# Patient Record
Sex: Male | Born: 1955
Health system: Southern US, Community
[De-identification: ages and names within clinical notes are randomized; demographics above are authoritative.]

## PROBLEM LIST (undated history)

## (undated) DIAGNOSIS — K7460 Unspecified cirrhosis of liver: Secondary | ICD-10-CM

## (undated) DIAGNOSIS — Z7289 Other problems related to lifestyle: Secondary | ICD-10-CM

## (undated) DIAGNOSIS — I7121 Aneurysm of the ascending aorta, without rupture: Secondary | ICD-10-CM

## (undated) DIAGNOSIS — D649 Anemia, unspecified: Secondary | ICD-10-CM

## (undated) DIAGNOSIS — Z5900 Homelessness unspecified: Secondary | ICD-10-CM

## (undated) DIAGNOSIS — F1090 Alcohol use, unspecified, uncomplicated: Secondary | ICD-10-CM

## (undated) DIAGNOSIS — Z789 Other specified health status: Secondary | ICD-10-CM

## (undated) DIAGNOSIS — D696 Thrombocytopenia, unspecified: Secondary | ICD-10-CM

## (undated) DIAGNOSIS — Z720 Tobacco use: Secondary | ICD-10-CM

## (undated) DIAGNOSIS — J984 Other disorders of lung: Secondary | ICD-10-CM

## (undated) HISTORY — PX: OTHER SURGICAL HISTORY: SHX169

---

## 1987-08-09 DIAGNOSIS — K279 Peptic ulcer, site unspecified, unspecified as acute or chronic, without hemorrhage or perforation: Secondary | ICD-10-CM | POA: Insufficient documentation

## 1987-08-09 HISTORY — DX: Peptic ulcer, site unspecified, unspecified as acute or chronic, without hemorrhage or perforation: K27.9

## 2001-09-17 ENCOUNTER — Encounter: Payer: Self-pay | Admitting: Emergency Medicine

## 2001-09-17 ENCOUNTER — Emergency Department (HOSPITAL_COMMUNITY): Admission: EM | Admit: 2001-09-17 | Discharge: 2001-09-17 | Payer: Self-pay | Admitting: Emergency Medicine

## 2014-03-25 ENCOUNTER — Emergency Department (HOSPITAL_COMMUNITY): Payer: Self-pay

## 2014-03-25 ENCOUNTER — Encounter (HOSPITAL_COMMUNITY): Payer: Self-pay

## 2014-03-25 ENCOUNTER — Emergency Department (HOSPITAL_COMMUNITY)
Admission: EM | Admit: 2014-03-25 | Discharge: 2014-03-26 | Disposition: A | Discharge status: Home / Self Care | Payer: Self-pay | Attending: Emergency Medicine | Admitting: Emergency Medicine

## 2014-03-25 DIAGNOSIS — F1010 Alcohol abuse, uncomplicated: Secondary | ICD-10-CM | POA: Insufficient documentation

## 2014-03-25 DIAGNOSIS — F10920 Alcohol use, unspecified with intoxication, uncomplicated: Secondary | ICD-10-CM

## 2014-03-25 DIAGNOSIS — R4182 Altered mental status, unspecified: Secondary | ICD-10-CM | POA: Insufficient documentation

## 2014-03-25 LAB — CBC WITH DIFFERENTIAL/PLATELET
BASOS ABS: 0 10*3/uL (ref 0.0–0.1)
Basophils Relative: 1 % (ref 0–1)
EOS PCT: 6 % — AB (ref 0–5)
Eosinophils Absolute: 0.3 10*3/uL (ref 0.0–0.7)
HEMATOCRIT: 38.1 % — AB (ref 39.0–52.0)
HEMOGLOBIN: 13.1 g/dL (ref 13.0–17.0)
Lymphocytes Relative: 37 % (ref 12–46)
Lymphs Abs: 1.8 10*3/uL (ref 0.7–4.0)
MCH: 32.3 pg (ref 26.0–34.0)
MCHC: 34.4 g/dL (ref 30.0–36.0)
MCV: 94.1 fL (ref 78.0–100.0)
MONO ABS: 0.5 10*3/uL (ref 0.1–1.0)
MONOS PCT: 11 % (ref 3–12)
NEUTROS ABS: 2.2 10*3/uL (ref 1.7–7.7)
Neutrophils Relative %: 45 % (ref 43–77)
Platelets: 145 10*3/uL — ABNORMAL LOW (ref 150–400)
RBC: 4.05 MIL/uL — ABNORMAL LOW (ref 4.22–5.81)
RDW: 19.9 % — AB (ref 11.5–15.5)
WBC: 4.8 10*3/uL (ref 4.0–10.5)

## 2014-03-25 LAB — RAPID URINE DRUG SCREEN, HOSP PERFORMED
Amphetamines: NOT DETECTED
Barbiturates: NOT DETECTED
Benzodiazepines: NOT DETECTED
Cocaine: NOT DETECTED
Opiates: NOT DETECTED
Tetrahydrocannabinol: NOT DETECTED

## 2014-03-25 LAB — ETHANOL: Alcohol, Ethyl (B): 237 mg/dL — ABNORMAL HIGH (ref 0–11)

## 2014-03-25 MED ORDER — NALOXONE HCL 1 MG/ML IJ SOLN
1.0000 mg | Freq: Once | INTRAMUSCULAR | Status: AC
  Administered 2014-03-25: 1 mg via INTRAVENOUS
  Filled 2014-03-25: qty 2

## 2014-03-25 MED ORDER — SODIUM CHLORIDE 0.9 % IV SOLN
Freq: Once | INTRAVENOUS | Status: AC
  Administered 2014-03-25: 23:00:00 via INTRAVENOUS

## 2014-03-25 MED ORDER — NALOXONE HCL 0.4 MG/ML IJ SOLN
0.4000 mg | Freq: Once | INTRAMUSCULAR | Status: AC
  Administered 2014-03-25: 0.4 mg via INTRAVENOUS
  Filled 2014-03-25: qty 1

## 2014-03-25 MED ORDER — SODIUM CHLORIDE 0.9 % IV SOLN
Freq: Once | INTRAVENOUS | Status: AC
  Administered 2014-03-25: 21:00:00 via INTRAVENOUS

## 2014-03-25 MED ORDER — NALOXONE HCL 1 MG/ML IJ SOLN
1.0000 mg | Freq: Once | INTRAMUSCULAR | Status: AC
  Administered 2014-03-25: 1 mg via INTRAVENOUS

## 2014-03-25 MED ORDER — THIAMINE HCL 100 MG/ML IJ SOLN
100.0000 mg | Freq: Every day | INTRAMUSCULAR | Status: DC
  Administered 2014-03-25: 100 mg via INTRAVENOUS
  Filled 2014-03-25: qty 2

## 2014-03-25 MED ORDER — ONDANSETRON HCL 4 MG/2ML IJ SOLN
4.0000 mg | Freq: Once | INTRAMUSCULAR | Status: AC
  Administered 2014-03-25: 4 mg via INTRAVENOUS
  Filled 2014-03-25: qty 2

## 2014-03-25 NOTE — ED Provider Notes (Signed)
Medical screening examination/treatment/procedure(s) were conducted as a shared visit with non-physician practitioner(s) and myself.  I personally evaluated the patient during the encounter.   EKG Interpretation None      58 yo male presenting with decreased responsiveness.  Apparently, he was witnessed ingesting some unknown pills.  On exam, GCS 9 (2,2,5), small abrasion to left ear otherwise head atraumatic, c spine without stepoffs, lungs CTAB with reduced respiratory effort, heart sounds normal with RRR, abd soft.  Given 1mg  narcan x 2 with mild improvement in responsiveness.  Came up to GCS of 3,4,5.  Plan CT head, labs.   On repeat exam, pt still intoxicated, but much easier to arouse.  He will be monitored while he sobers.   Clinical Impression: 1. Alcohol intoxication, uncomplicated       Candyce ChurnJohn David Hanley Woerner III, MD 03/26/14 1620

## 2014-03-25 NOTE — ED Provider Notes (Signed)
CSN: [id number]     Arrival date & time 03/25/14  2026 History   First MD Initiated Contact with Patient 03/25/14 2027     Chief Complaint  Patient presents with  . Altered Mental Status     (Consider location/radiation/quality/duration/timing/severity/associated sxs/prior Treatment) HPI Comments: Cody Glenn Is a 58 y/o male found lying face down by police at the scene of a call for disturbing the peace. Police were called out to the scene for fighting. Patient was apparently drinking all day with friends. He apparently took 2 small "green pills" according to friends. They were unknown. No other history is known.   Patient is a 58 y.o. male presenting with altered mental status.  Altered Mental Status Presenting symptoms: partial responsiveness   Severity:  Severe Most recent episode:  Today Episode history:  Unable to specify Progression:  Improving Context: alcohol use and drug use     History reviewed. No pertinent past medical history. No past surgical history on file. No family history on file. History  Substance Use Topics  . Smoking status: Not on file  . Smokeless tobacco: Not on file  . Alcohol Use: Not on file    Review of Systems  Unable to perform ROS     Allergies  Review of patient's allergies indicates no known allergies.  Home Medications   Prior to Admission medications   Not on File   BP 131/82  Pulse 54  Temp(Src) 98.7 F (37.1 C) (Oral)  Resp 13  SpO2 99% Physical Exam  Nursing note and vitals reviewed. HENT:  Head: Normocephalic.  Small abrasion Behind left ear  no other signs of head trauma  Eyes:  Sluggish, pinpoint pupils  Neck: Normal range of motion. No JVD present.  Cardiovascular: Normal rate and regular rhythm.   Pulmonary/Chest: Effort normal. No respiratory distress.  Abdominal: Soft. Bowel sounds are normal.  Well healed midline abdominal scar  Neurological: GCS eye subscore is 2. GCS verbal subscore is 2. GCS  motor subscore is 5.    ED Course  Procedures (including critical care time) Labs Review Labs Reviewed  CBC WITH DIFFERENTIAL - Abnormal; Notable for the following:    RBC 4.05 (*)    HCT 38.1 (*)    RDW 19.9 (*)    Platelets 145 (*)    Eosinophils Relative 6 (*)    All other components within normal limits  ETHANOL - Abnormal; Notable for the following:    Alcohol, Ethyl (B) 237 (*)    All other components within normal limits  URINE RAPID DRUG SCREEN (HOSP PERFORMED)  CBG MONITORING, ED    Imaging Review No results found.   EKG Interpretation None      CRITICAL CARE Performed by: Arthor CaptainHarris, Cylan Borum   Total critical care time: 30  Close monitoring of airway, altered mental status. Multiple doses of narcan.  Critical care time was exclusive of separately billable procedures and treating other patients.  Critical care was necessary to treat or prevent imminent or life-threatening deterioration.  Critical care was time spent personally by me on the following activities: development of treatment plan with patient and/or surrogate as well as nursing, discussions with consultants, evaluation of patient's response to treatment, examination of patient, obtaining history from patient or surrogate, ordering and performing treatments and interventions, ordering and review of laboratory studies, ordering and review of radiographic studies, pulse oximetry and re-evaluation of patient's condition.  MDM   Final diagnoses:  None    9:30 PM Patient  brought in by EMS [employer]. Level five caveat due to AMS.  GCS of 9. Narcan 0.4 given,  AMS work up.    10:00PM patient destaurated to 89% Multiple rounds of narcan given with little response.      12:16 AM BP 131/82  Pulse 54  Temp(Src) 98.7 F (37.1 C) (Oral)  Resp 13  SpO2 99% Patient contnues to be somnolent but is arousable to voice and beginning to arouse. ETOH 237. uds is negative. CT head and cspine negative. Feel  this is due to alcohol intoxication. Will obs in the ED.  I have given report to Dr. Manus Gunning who will assume care.  Arthor Captain, PA-C 03/26/14 1559

## 2014-03-25 NOTE — ED Notes (Signed)
PT to ED via [facility] after reported being found on floor.  Pt's friends st's pt has been drinking and also took (2) unknown green round pills.  On arrival to ED pt unresponsive to verbal and painful stimuli.  Pt did move bil arms with ammonia capsule.  On arrival pt has c-collar in place

## 2014-03-25 NOTE — ED Notes (Signed)
No response with narcan.  MD made aware

## 2014-03-26 NOTE — Discharge Instructions (Signed)
Alcohol Intoxication  Alcohol intoxication occurs when the amount of alcohol that a person has consumed impairs his or her ability to mentally and physically function. Alcohol directly impairs the normal chemical activity of the brain. Drinking large amounts of alcohol can lead to changes in mental function and behavior, and it can cause many physical effects that can be harmful.   Alcohol intoxication can range in severity from mild to very severe. Various factors can affect the level of intoxication that occurs, such as the person's age, gender, weight, frequency of alcohol consumption, and the presence of other medical conditions (such as diabetes, seizures, or heart conditions). Dangerous levels of alcohol intoxication may occur when people drink large amounts of alcohol in a short period (binge drinking). Alcohol can also be especially dangerous when combined with certain prescription medicines or "recreational" drugs.  SIGNS AND SYMPTOMS  Some common signs and symptoms of mild alcohol intoxication include:  · Loss of coordination.  · Changes in mood and behavior.  · Impaired judgment.  · Slurred speech.  As alcohol intoxication progresses to more severe levels, other signs and symptoms will appear. These may include:  · Vomiting.  · Confusion and impaired memory.  · Slowed breathing.  · Seizures.  · Loss of consciousness.  DIAGNOSIS   Your health care provider will take a medical history and perform a physical exam. You will be asked about the amount and type of alcohol you have consumed. Blood tests will be done to measure the concentration of alcohol in your blood. In many places, your blood alcohol level must be lower than 80 mg/dL (0.08%) to legally drive. However, many dangerous effects of alcohol can occur at much lower levels.   TREATMENT   People with alcohol intoxication often do not require treatment. Most of the effects of alcohol intoxication are temporary, and they go away as the alcohol naturally  leaves the body. Your health care provider will monitor your condition until you are stable enough to go home. Fluids are sometimes given through an IV access tube to help prevent dehydration.   HOME CARE INSTRUCTIONS  · Do not drive after drinking alcohol.  · Stay hydrated. Drink enough water and fluids to keep your urine clear or pale yellow. Avoid caffeine.    · Only take over-the-counter or prescription medicines as directed by your health care provider.    SEEK MEDICAL CARE IF:   · You have persistent vomiting.    · You do not feel better after a few days.  · You have frequent alcohol intoxication. Your health care provider can help determine if you should see a substance use treatment counselor.  SEEK IMMEDIATE MEDICAL CARE IF:   · You become shaky or tremble when you try to stop drinking.    · You shake uncontrollably (seizure).    · You throw up (vomit) blood. This may be bright red or may look like black coffee grounds.    · You have blood in your stool. This may be bright red or may appear as a black, tarry, bad smelling stool.    · You become lightheaded or faint.    MAKE SURE YOU:   · Understand these instructions.  · Will watch your condition.  · Will get help right away if you are not doing well or get worse.  Document Released: 05/04/2005 Document Revised: 03/27/2013 Document Reviewed: 12/28/2012  [employer]® Patient Information ©2015 [employer]. This information is not intended to replace advice given to you by your health care provider. Make sure   you discuss any questions you have with your health care provider.

## 2014-03-26 NOTE — ED Provider Notes (Signed)
Medical screening examination/treatment/procedure(s) were performed by non-physician practitioner and as supervising physician I was immediately available for consultation/collaboration.   EKG Interpretation None        Cody ChurnJohn David Sundee Garland III, MD 03/26/14 [phone]

## 2014-03-26 NOTE — ED Provider Notes (Signed)
Recheck at 5:30 AM. Patient is awake and alert. He is oriented x3. He denies any complaint. He admits he has an alcohol problem. He denies any suicidal or homicidal thoughts. Tolerating by mouth and able to ambulate without assistance. He declined assistance with his alcohol abuse at this time. He denies any suicidal or homicidal thoughts  BP 134/77  Pulse 89  Temp(Src) 98 F (36.7 C) (Oral)  Resp 18  SpO2 99%   Cody OctaveStephen Nelson Julson, MD 03/26/14 [phone]

## 2014-03-26 NOTE — ED Notes (Signed)
Pt continues to sleep will arouse when spoken to.  Pt rolled over and urinated in floor then went back to sleep.

## 2014-03-26 NOTE — ED Notes (Signed)
Patient able to drink with no problems pt ambulated down the hall with steady gait.

## 2020-06-29 ENCOUNTER — Encounter (HOSPITAL_COMMUNITY): Payer: Self-pay

## 2020-06-29 ENCOUNTER — Emergency Department (HOSPITAL_COMMUNITY): Payer: Self-pay

## 2020-06-29 ENCOUNTER — Other Ambulatory Visit: Payer: Self-pay

## 2020-06-29 ENCOUNTER — Inpatient Hospital Stay (HOSPITAL_COMMUNITY)
Admission: EM | Admit: 2020-06-29 | Discharge: 2020-07-06 | DRG: 641 | Disposition: A | Discharge status: Home / Self Care | Payer: Self-pay | Attending: Family Medicine | Admitting: Family Medicine

## 2020-06-29 DIAGNOSIS — Z789 Other specified health status: Secondary | ICD-10-CM

## 2020-06-29 DIAGNOSIS — R7401 Elevation of levels of liver transaminase levels: Secondary | ICD-10-CM | POA: Diagnosis present

## 2020-06-29 DIAGNOSIS — R030 Elevated blood-pressure reading, without diagnosis of hypertension: Secondary | ICD-10-CM | POA: Diagnosis present

## 2020-06-29 DIAGNOSIS — E440 Moderate protein-calorie malnutrition: Secondary | ICD-10-CM | POA: Insufficient documentation

## 2020-06-29 DIAGNOSIS — Z6822 Body mass index (BMI) 22.0-22.9, adult: Secondary | ICD-10-CM

## 2020-06-29 DIAGNOSIS — Z720 Tobacco use: Secondary | ICD-10-CM | POA: Diagnosis present

## 2020-06-29 DIAGNOSIS — W19XXXA Unspecified fall, initial encounter: Secondary | ICD-10-CM

## 2020-06-29 DIAGNOSIS — R200 Anesthesia of skin: Secondary | ICD-10-CM | POA: Diagnosis present

## 2020-06-29 DIAGNOSIS — F17210 Nicotine dependence, cigarettes, uncomplicated: Secondary | ICD-10-CM | POA: Diagnosis present

## 2020-06-29 DIAGNOSIS — E871 Hypo-osmolality and hyponatremia: Principal | ICD-10-CM | POA: Diagnosis present

## 2020-06-29 DIAGNOSIS — M1611 Unilateral primary osteoarthritis, right hip: Secondary | ICD-10-CM | POA: Diagnosis present

## 2020-06-29 DIAGNOSIS — Z20822 Contact with and (suspected) exposure to covid-19: Secondary | ICD-10-CM | POA: Diagnosis present

## 2020-06-29 DIAGNOSIS — Y909 Presence of alcohol in blood, level not specified: Secondary | ICD-10-CM | POA: Diagnosis present

## 2020-06-29 DIAGNOSIS — F1090 Alcohol use, unspecified, uncomplicated: Secondary | ICD-10-CM

## 2020-06-29 DIAGNOSIS — E876 Hypokalemia: Secondary | ICD-10-CM

## 2020-06-29 DIAGNOSIS — R17 Unspecified jaundice: Secondary | ICD-10-CM | POA: Diagnosis present

## 2020-06-29 DIAGNOSIS — M4802 Spinal stenosis, cervical region: Secondary | ICD-10-CM | POA: Diagnosis present

## 2020-06-29 DIAGNOSIS — F10229 Alcohol dependence with intoxication, unspecified: Secondary | ICD-10-CM | POA: Diagnosis present

## 2020-06-29 DIAGNOSIS — Z7289 Other problems related to lifestyle: Secondary | ICD-10-CM

## 2020-06-29 DIAGNOSIS — Z5900 Homelessness unspecified: Secondary | ICD-10-CM

## 2020-06-29 DIAGNOSIS — W101XXA Fall (on)(from) sidewalk curb, initial encounter: Secondary | ICD-10-CM | POA: Diagnosis present

## 2020-06-29 DIAGNOSIS — S022XXA Fracture of nasal bones, initial encounter for closed fracture: Secondary | ICD-10-CM | POA: Diagnosis present

## 2020-06-29 HISTORY — DX: Alcohol use, unspecified, uncomplicated: F10.90

## 2020-06-29 HISTORY — DX: Other specified health status: Z78.9

## 2020-06-29 HISTORY — DX: Other problems related to lifestyle: Z72.89

## 2020-06-29 LAB — COMPREHENSIVE METABOLIC PANEL
ALT: 24 U/L (ref 0–44)
AST: 64 U/L — ABNORMAL HIGH (ref 15–41)
Albumin: 3.8 g/dL (ref 3.5–5.0)
Alkaline Phosphatase: 81 U/L (ref 38–126)
Anion gap: 14 (ref 5–15)
BUN: 8 mg/dL (ref 8–23)
CO2: 18 mmol/L — ABNORMAL LOW (ref 22–32)
Calcium: 8.5 mg/dL — ABNORMAL LOW (ref 8.9–10.3)
Chloride: 79 mmol/L — ABNORMAL LOW (ref 98–111)
Creatinine, Ser: 0.46 mg/dL — ABNORMAL LOW (ref 0.61–1.24)
GFR, Estimated: >60 mL/min (ref >60)
Glucose, Bld: 74 mg/dL (ref 70–99)
Potassium: 4.6 mmol/L (ref 3.5–5.1)
Sodium: 111 mmol/L — CL (ref 135–145)
Total Bilirubin: 2.9 mg/dL — ABNORMAL HIGH (ref 0.3–1.2)
Total Protein: 7.1 g/dL (ref 6.5–8.1)

## 2020-06-29 LAB — URINALYSIS, ROUTINE W REFLEX MICROSCOPIC
Bilirubin Urine: NEGATIVE
Glucose, UA: NEGATIVE mg/dL
Hgb urine dipstick: NEGATIVE
Ketones, ur: 20 mg/dL — AB
Leukocytes,Ua: NEGATIVE
Nitrite: NEGATIVE
Protein, ur: 100 mg/dL — AB
Specific Gravity, Urine: 1.02 (ref 1.005–1.030)
pH: 6 (ref 5.0–8.0)

## 2020-06-29 LAB — CBC WITH DIFFERENTIAL/PLATELET
Abs Immature Granulocytes: 0.07 10*3/uL (ref 0.00–0.07)
Basophils Absolute: 0 10*3/uL (ref 0.0–0.1)
Basophils Relative: 0 %
Eosinophils Absolute: 0 10*3/uL (ref 0.0–0.5)
Eosinophils Relative: 0 %
HCT: 35.5 % — ABNORMAL LOW (ref 39.0–52.0)
Hemoglobin: 13.2 g/dL (ref 13.0–17.0)
Immature Granulocytes: 1 %
Lymphocytes Relative: 12 %
Lymphs Abs: 1.3 10*3/uL (ref 0.7–4.0)
MCH: 34.4 pg — ABNORMAL HIGH (ref 26.0–34.0)
MCHC: 37.2 g/dL — ABNORMAL HIGH (ref 30.0–36.0)
MCV: 92.4 fL (ref 80.0–100.0)
Monocytes Absolute: 1 10*3/uL (ref 0.1–1.0)
Monocytes Relative: 10 %
Neutro Abs: 7.9 10*3/uL — ABNORMAL HIGH (ref 1.7–7.7)
Neutrophils Relative %: 77 %
Platelets: 240 10*3/uL (ref 150–400)
RBC: 3.84 MIL/uL — ABNORMAL LOW (ref 4.22–5.81)
RDW: 11.8 % (ref 11.5–15.5)
WBC: 10.3 10*3/uL (ref 4.0–10.5)
nRBC: 0 % (ref 0.0–0.2)

## 2020-06-29 LAB — RESP PANEL BY RT-PCR (FLU A&B, COVID) ARPGX2
Influenza A by PCR: NEGATIVE
Influenza B by PCR: NEGATIVE
SARS Coronavirus 2 by RT PCR: NEGATIVE

## 2020-06-29 LAB — RAPID URINE DRUG SCREEN, HOSP PERFORMED
Amphetamines: NOT DETECTED
Barbiturates: NOT DETECTED
Benzodiazepines: NOT DETECTED
Cocaine: NOT DETECTED
Opiates: NOT DETECTED
Tetrahydrocannabinol: NOT DETECTED

## 2020-06-29 LAB — ETHANOL: Alcohol, Ethyl (B): 14 mg/dL — ABNORMAL HIGH (ref <10)

## 2020-06-29 LAB — CREATININE, URINE, RANDOM: Creatinine, Urine: 114.97 mg/dL

## 2020-06-29 LAB — MAGNESIUM: Magnesium: 1.6 mg/dL — ABNORMAL LOW (ref 1.7–2.4)

## 2020-06-29 MED ORDER — ACETAMINOPHEN 325 MG PO TABS
650.0000 mg | ORAL_TABLET | Freq: Four times a day (QID) | ORAL | Status: DC | PRN
  Administered 2020-07-01 – 2020-07-04 (×2): 650 mg via ORAL
  Filled 2020-06-29 (×2): qty 2

## 2020-06-29 MED ORDER — SODIUM CHLORIDE 0.9 % IV BOLUS
1000.0000 mL | Freq: Once | INTRAVENOUS | Status: AC
  Administered 2020-06-29: 1000 mL via INTRAVENOUS

## 2020-06-29 MED ORDER — LORAZEPAM 1 MG PO TABS: 1.0000 mg | ORAL_TABLET | ORAL | Status: AC | PRN

## 2020-06-29 MED ORDER — THIAMINE HCL 100 MG/ML IJ SOLN
100.0000 mg | Freq: Every day | INTRAMUSCULAR | Status: DC
  Filled 2020-06-29 (×2): qty 2

## 2020-06-29 MED ORDER — ONDANSETRON HCL 4 MG PO TABS: 4.0000 mg | ORAL_TABLET | Freq: Four times a day (QID) | ORAL | Status: DC | PRN

## 2020-06-29 MED ORDER — ACETAMINOPHEN 650 MG RE SUPP: 650.0000 mg | Freq: Four times a day (QID) | RECTAL | Status: DC | PRN

## 2020-06-29 MED ORDER — CHLORHEXIDINE GLUCONATE CLOTH 2 % EX PADS
6.0000 | MEDICATED_PAD | Freq: Every day | CUTANEOUS | Status: DC
  Administered 2020-07-01 – 2020-07-02 (×3): 6 via TOPICAL

## 2020-06-29 MED ORDER — ORAL CARE MOUTH RINSE
15.0000 mL | Freq: Two times a day (BID) | OROMUCOSAL | Status: DC
  Administered 2020-06-30 – 2020-07-06 (×10): 15 mL via OROMUCOSAL

## 2020-06-29 MED ORDER — LORAZEPAM 2 MG/ML IJ SOLN: 1.0000 mg | INTRAMUSCULAR | Status: AC | PRN

## 2020-06-29 MED ORDER — THIAMINE HCL 100 MG PO TABS
100.0000 mg | ORAL_TABLET | Freq: Every day | ORAL | Status: DC
  Administered 2020-06-30 – 2020-07-06 (×7): 100 mg via ORAL
  Filled 2020-06-29 (×7): qty 1

## 2020-06-29 MED ORDER — ONDANSETRON HCL 4 MG/2ML IJ SOLN: 4.0000 mg | Freq: Four times a day (QID) | INTRAMUSCULAR | Status: DC | PRN

## 2020-06-29 MED ORDER — ENSURE ENLIVE PO LIQD
237.0000 mL | Freq: Two times a day (BID) | ORAL | Status: DC
  Administered 2020-06-30 – 2020-07-02 (×4): 237 mL via ORAL

## 2020-06-29 MED ORDER — NICOTINE 21 MG/24HR TD PT24
21.0000 mg | MEDICATED_PATCH | Freq: Every day | TRANSDERMAL | Status: DC
  Administered 2020-06-30 – 2020-07-06 (×7): 21 mg via TRANSDERMAL
  Filled 2020-06-29 (×7): qty 1

## 2020-06-29 MED ORDER — FOLIC ACID 1 MG PO TABS
1.0000 mg | ORAL_TABLET | Freq: Every day | ORAL | Status: DC
  Administered 2020-06-30 – 2020-07-06 (×7): 1 mg via ORAL
  Filled 2020-06-29 (×7): qty 1

## 2020-06-29 MED ORDER — MAGNESIUM SULFATE 2 GM/50ML IV SOLN
2.0000 g | Freq: Once | INTRAVENOUS | Status: AC
  Administered 2020-06-29: 2 g via INTRAVENOUS
  Filled 2020-06-29: qty 50

## 2020-06-29 MED ORDER — ADULT MULTIVITAMIN W/MINERALS CH
1.0000 | ORAL_TABLET | Freq: Every day | ORAL | Status: DC
  Administered 2020-06-30 – 2020-07-06 (×7): 1 via ORAL
  Filled 2020-06-29 (×7): qty 1

## 2020-06-29 MED ORDER — SODIUM CHLORIDE 0.9 % IV SOLN: INTRAVENOUS | Status: AC

## 2020-06-29 NOTE — H&P (Signed)
History and Physical    Cody Glenn BSJ:[id number] DOB: 07-22-1956 DOA: 06/29/2020  PCP: Patient, No Pcp Per  Patient coming from: Home via EMS  I have personally briefly reviewed patient's old medical records in [place]  Chief Complaint: Fall  HPI: Cody Glenn is a 64 y.o. male with medical history significant for alcohol and tobacco use who presents to the ED for evaluation after a fall.  Patient states he was outside when he was walking along the sidewalk when he tripped over the curb.  He states fell forward onto his face.  He says he thinks he lost consciousness temporarily.  He says he has been feeling lightheaded all day.  He has been having nausea and vomiting and some shortness of breath.  He reports a chronic cough which is unchanged.  He has not had any chest pain, abdominal pain, dysuria, diarrhea, seizure activity, loss of bowel/bladder control.  Patient does report chronic alcohol use, at least 3 quarts of beer daily with unspecified amount of liquor as well.  He is a chronic smoker of 1 pack/day since age of 35.  He denies any marijuana, cocaine, or IV drug use.  He reports a history of bleeding stomach ulcer requiring surgery over 30 years ago.  He is unaware of any other chronic medical conditions.  He is not currently taking any medications.  He says he has a primary care doctor with the [facility] system.  He is unaware of any medical conditions in his immediate family.  ED Course:  Initial vitals showed BP 167/74, pulse 84, RR 16, temp 98.4 Fahrenheit, SPO2 100% on room air.  Labs notable for sodium 111, potassium 4.6, chloride 79, bicarb 28, BUN 8, creatinine 0.46, serum glucose 74, AST 64, ALT 24, alk phos 81, total bilirubin 2.9, albumin 3.8, WBC 10.3, hemoglobin 13.2, platelets 240,000, serum ethanol 14.  Magnesium 1.6.  Urinalysis shows negative nitrates, negative leukocytes, 0-5 RBC/hpf, 6-10 WBC/hpf, rare bacteria on microscopy.  UDS is negative.  Serum  osmolality, urine osmolality and creatinine were obtained and pending.  SARS-CoV-2 PCR is collected and pending.  Right hip x-ray was negative for acute osseous abnormality.  Left hip x-ray was negative for acute osseous abnormality.  CT head/maxillofacial/C-spine without contrast showed prominent right forehead/periorbital hematoma, cystic lesion within the suprasellar region measuring 3.6 cm which is unchanged in size compared to previous CT March 25, 2014.  Medially displaced fracture to the left nasal bone was seen.  No evidence of acute fracture to the cervical spine noted.  Patient was given 1 L normal saline and the hospitalist service was consulted to admit for further evaluation and management.  Review of Systems: All systems reviewed and are negative except as documented in history of present illness above.   Past Medical History:  Diagnosis Date  . Alcohol use     Past Surgical History:  Procedure Laterality Date  . ulcer surgery in stomach     pt states he had this approx 33 years ago    Social History:  reports that he has been smoking cigarettes. He has a 40.00 pack-year smoking history. He has never used smokeless tobacco. He reports current alcohol use. He reports previous drug use.  No Known Allergies  History reviewed. No pertinent family history.   Prior to Admission medications   Not on File    Physical Exam: Vitals:   06/29/20 2115 06/29/20 2121 06/29/20 2130 06/29/20 2200  BP:  (!) 150/104 (!) 151/118 Marland Kitchen)  152/110  Pulse: 83 92 79 83  Resp:   18   Temp:      TempSrc:      SpO2: 100% 97% 99% 100%   Constitutional: Resting supine in bed, NAD, calm, comfortable Eyes: Left pupil round and reactive, unable to assess right due to periorbital hematoma. ENMT: Mucous membranes are moist. Posterior pharynx clear of any exudate or lesions.Normal dentition.  Neck: normal, supple, no masses. Respiratory: clear to auscultation bilaterally, no wheezing, no  crackles. Normal respiratory effort. No accessory muscle use.  Cardiovascular: Regular rate and rhythm, no murmurs / rubs / gallops. No extremity edema. 2+ pedal pulses. Abdomen: no tenderness, no masses palpated. No hepatosplenomegaly. Bowel sounds positive.  Musculoskeletal: no clubbing / cyanosis. No joint deformity upper and lower extremities. Good ROM, no contractures. Normal muscle tone.  Skin: Large right periorbital hematoma, abrasion on frontal scalp Neurologic: CN 2-12 grossly intact. Sensation intact, Strength 5/5 in all 4.  Psychiatric: Normal judgment and insight. Alert and oriented x 3. Normal mood.   Labs on Admission: I have personally reviewed following labs and imaging studies  CBC: Recent Labs  Lab 06/29/20 1903  WBC 10.3  NEUTROABS 7.9*  HGB 13.2  HCT 35.5*  MCV 92.4  PLT 027   Basic Metabolic Panel: Recent Labs  Lab 06/29/20 1903 06/29/20 1933  NA 111*  --   K 4.6  --   CL 79*  --   CO2 18*  --   GLUCOSE 74  --   BUN 8  --   CREATININE 0.46*  --   CALCIUM 8.5*  --   MG  --  1.6*   GFR: CrCl cannot be calculated (Unknown ideal weight.). Liver Function Tests: Recent Labs  Lab 06/29/20 1903  AST 64*  ALT 24  ALKPHOS 81  BILITOT 2.9*  PROT 7.1  ALBUMIN 3.8   No results for input(s): LIPASE, AMYLASE in the last 168 hours. No results for input(s): AMMONIA in the last 168 hours. Coagulation Profile: No results for input(s): INR, PROTIME in the last 168 hours. Cardiac Enzymes: No results for input(s): CKTOTAL, CKMB, CKMBINDEX, TROPONINI in the last 168 hours. BNP (last 3 results) No results for input(s): PROBNP in the last 8760 hours. HbA1C: No results for input(s): HGBA1C in the last 72 hours. CBG: No results for input(s): GLUCAP in the last 168 hours. Lipid Profile: No results for input(s): CHOL, HDL, LDLCALC, TRIG, CHOLHDL, LDLDIRECT in the last 72 hours. Thyroid Function Tests: No results for input(s): TSH, T4TOTAL, FREET4, T3FREE,  THYROIDAB in the last 72 hours. Anemia Panel: No results for input(s): VITAMINB12, FOLATE, FERRITIN, TIBC, IRON, RETICCTPCT in the last 72 hours. Urine analysis:    Component Value Date/Time   COLORURINE AMBER (A) 06/29/2020 1933   APPEARANCEUR CLEAR 06/29/2020 1933   LABSPEC 1.020 06/29/2020 1933   PHURINE 6.0 06/29/2020 1933   GLUCOSEU NEGATIVE 06/29/2020 [place] NEGATIVE 06/29/2020 [place] NEGATIVE 06/29/2020 1933   KETONESUR 20 (A) 06/29/2020 1933   PROTEINUR 100 (A) 06/29/2020 1933   NITRITE NEGATIVE 06/29/2020 1933   LEUKOCYTESUR NEGATIVE 06/29/2020 1933    Radiological Exams on Admission: CT Head Wo Contrast  Result Date: 06/29/2020 CLINICAL DATA:  Facial trauma. Additional history provided: Status post mechanical fall off curb, bilateral hip pain, head trauma. EXAM: CT HEAD WITHOUT CONTRAST CT MAXILLOFACIAL WITHOUT CONTRAST CT CERVICAL SPINE WITHOUT CONTRAST TECHNIQUE: Multidetector CT imaging of the head, cervical spine, and maxillofacial structures were performed using the  standard protocol without intravenous contrast. Multiplanar CT image reconstructions of the cervical spine and maxillofacial structures were also generated. COMPARISON:  CT head/cervical spine 03/25/2014. FINDINGS: CT HEAD FINDINGS Brain: Mild generalized cerebral atrophy. Redemonstrated CSF density cystic lesion in the suprasellar region. This is unchanged in size as compared to the head CT of 03/25/2014, again measuring up to 3.6 cm in greatest single dimension. There is no acute intracranial hemorrhage. No demarcated cortical infarct. No midline shift. Vascular: No hyperdense vessel.  Atherosclerotic calcifications Skull: Normal. Negative for fracture or focal lesion. Other: Large right forehead/right periorbital hematoma. CT MAXILLOFACIAL FINDINGS Osseous: Medially displaced fracture of the left nasal bone, new as compared to the prior head CT of 03/25/2014, but otherwise age indeterminate. No  other maxillofacial fracture is identified. Orbits: Prominent right periorbital hematoma. Otherwise, no acute finding. The globes are normal in size and contour. The extraocular muscles and optic nerve sheath complexes are symmetric and unremarkable. Sinuses: Trace scattered paranasal sinus mucosal thickening. No air-fluid levels. Soft tissues: Prominent right forehead/periorbital hematoma. Other: S-shaped deviation of the bony nasal septum. Small nasal septal defect (series 9, image 22). CT CERVICAL SPINE FINDINGS Alignment: Straightening of the expected cervical lordosis. Trace C4-C5 grade 1 anterolisthesis. Skull base and vertebrae: The basion-dental and atlanto-dental intervals are maintained.No evidence of acute fracture to the cervical spine. Prominent multilevel ventral osteophytes. Soft tissues and spinal canal: No prevertebral fluid or swelling. No visible canal hematoma. Disc levels: Cervical spondylosis with multilevel disc space narrowing, disc bulges, posterior disc osteophytes, uncovertebral hypertrophy and facet arthrosis. There is moderate (and possibly severe) no canal stenosis at C5-C6 and C6-C7. Multilevel bony neural foraminal narrowing. Upper chest: Paraseptal emphysema.  No visible pneumothorax. IMPRESSION: CT head: 1. No acute posttraumatic intracranial findings. 2. Prominent right forehead/periorbital hematoma. 3. Cystic lesion within the suprasellar region measuring 3.6 cm in greatest single dimension, unchanged in size as compared to the head CT of 03/25/2014. Differential considerations include epidermoid, arachnoid cyst or subarachnoid adhesion. Nonemergent contrast-enhanced MRI is recommended if not already performed. 4. Mild cerebral atrophy. CT maxillofacial: 1. Medially displaced fracture of the left nasal bone, new as compared to the head CT of 03/25/2014 but otherwise age indeterminate. 2. No other maxillofacial fracture is identified. 3. Prominent right forehead/periorbital  hematoma. 4. S-shaped deviation of the bony nasal septum. Small nasal septal defect. CT cervical spine: 1. No evidence of acute fracture to the cervical spine. 2. Mild chronic C4-C5 grade 1 anterolisthesis. 3. Cervical spondylosis as described. Suspect moderate (and possibly severe) spinal canal stenosis at C5-C6 and C6-C7 4.  Emphysema (ICD10-J43.9). Electronically Signed   By: Kellie Simmering DO   On: 06/29/2020 20:48   CT Cervical Spine Wo Contrast  Result Date: 06/29/2020 CLINICAL DATA:  Facial trauma. Additional history provided: Status post mechanical fall off curb, bilateral hip pain, head trauma. EXAM: CT HEAD WITHOUT CONTRAST CT MAXILLOFACIAL WITHOUT CONTRAST CT CERVICAL SPINE WITHOUT CONTRAST TECHNIQUE: Multidetector CT imaging of the head, cervical spine, and maxillofacial structures were performed using the standard protocol without intravenous contrast. Multiplanar CT image reconstructions of the cervical spine and maxillofacial structures were also generated. COMPARISON:  CT head/cervical spine 03/25/2014. FINDINGS: CT HEAD FINDINGS Brain: Mild generalized cerebral atrophy. Redemonstrated CSF density cystic lesion in the suprasellar region. This is unchanged in size as compared to the head CT of 03/25/2014, again measuring up to 3.6 cm in greatest single dimension. There is no acute intracranial hemorrhage. No demarcated cortical infarct. No midline shift. Vascular: No hyperdense vessel.  Atherosclerotic calcifications Skull: Normal. Negative for fracture or focal lesion. Other: Large right forehead/right periorbital hematoma. CT MAXILLOFACIAL FINDINGS Osseous: Medially displaced fracture of the left nasal bone, new as compared to the prior head CT of 03/25/2014, but otherwise age indeterminate. No other maxillofacial fracture is identified. Orbits: Prominent right periorbital hematoma. Otherwise, no acute finding. The globes are normal in size and contour. The extraocular muscles and optic nerve  sheath complexes are symmetric and unremarkable. Sinuses: Trace scattered paranasal sinus mucosal thickening. No air-fluid levels. Soft tissues: Prominent right forehead/periorbital hematoma. Other: S-shaped deviation of the bony nasal septum. Small nasal septal defect (series 9, image 22). CT CERVICAL SPINE FINDINGS Alignment: Straightening of the expected cervical lordosis. Trace C4-C5 grade 1 anterolisthesis. Skull base and vertebrae: The basion-dental and atlanto-dental intervals are maintained.No evidence of acute fracture to the cervical spine. Prominent multilevel ventral osteophytes. Soft tissues and spinal canal: No prevertebral fluid or swelling. No visible canal hematoma. Disc levels: Cervical spondylosis with multilevel disc space narrowing, disc bulges, posterior disc osteophytes, uncovertebral hypertrophy and facet arthrosis. There is moderate (and possibly severe) no canal stenosis at C5-C6 and C6-C7. Multilevel bony neural foraminal narrowing. Upper chest: Paraseptal emphysema.  No visible pneumothorax. IMPRESSION: CT head: 1. No acute posttraumatic intracranial findings. 2. Prominent right forehead/periorbital hematoma. 3. Cystic lesion within the suprasellar region measuring 3.6 cm in greatest single dimension, unchanged in size as compared to the head CT of 03/25/2014. Differential considerations include epidermoid, arachnoid cyst or subarachnoid adhesion. Nonemergent contrast-enhanced MRI is recommended if not already performed. 4. Mild cerebral atrophy. CT maxillofacial: 1. Medially displaced fracture of the left nasal bone, new as compared to the head CT of 03/25/2014 but otherwise age indeterminate. 2. No other maxillofacial fracture is identified. 3. Prominent right forehead/periorbital hematoma. 4. S-shaped deviation of the bony nasal septum. Small nasal septal defect. CT cervical spine: 1. No evidence of acute fracture to the cervical spine. 2. Mild chronic C4-C5 grade 1 anterolisthesis. 3.  Cervical spondylosis as described. Suspect moderate (and possibly severe) spinal canal stenosis at C5-C6 and C6-C7 4.  Emphysema (ICD10-J43.9). Electronically Signed   By: Kellie Simmering DO   On: 06/29/2020 20:48   DG Hip Unilat With Pelvis 2-3 Views Left  Result Date: 06/29/2020 CLINICAL DATA:  Hip pain history of fall EXAM: DG HIP (WITH OR WITHOUT PELVIS) 2-3V LEFT COMPARISON:  None. FINDINGS: SI joints are non widened. The pubic symphysis and rami appear intact. No acute displaced fracture or malalignment. 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 Vascular calcifications. IMPRESSION: No acute osseous abnormality. Electronically Signed   By: Donavan Foil M.D.   On: 06/29/2020 18:34   DG Hip Unilat  With Pelvis 2-3 Views Right  Result Date: 06/29/2020 CLINICAL DATA:  Hip pain after fall EXAM: DG HIP (WITH OR WITHOUT PELVIS) 2-3V RIGHT COMPARISON:  None. FINDINGS: No fracture or malalignment. Moderate arthritis of the right hip. Right pubic rami appear intact. IMPRESSION: No acute osseous abnormality. Electronically Signed   By: Donavan Foil M.D.   On: 06/29/2020 18:32   CT Maxillofacial Wo Contrast  Result Date: 06/29/2020 CLINICAL DATA:  Facial trauma. Additional history provided: Status post mechanical fall off curb, bilateral hip pain, head trauma. EXAM: CT HEAD WITHOUT CONTRAST CT MAXILLOFACIAL WITHOUT CONTRAST CT CERVICAL SPINE WITHOUT CONTRAST TECHNIQUE: Multidetector CT imaging of the head, cervical spine, and maxillofacial structures were performed using the standard protocol without intravenous contrast. Multiplanar CT image reconstructions of the cervical spine and maxillofacial structures were also  generated. COMPARISON:  CT head/cervical spine 03/25/2014. FINDINGS: CT HEAD FINDINGS Brain: Mild generalized cerebral atrophy. Redemonstrated CSF density cystic lesion in the suprasellar region. This is unchanged in size as compared to the head CT of 03/25/2014, again measuring up to 3.6 cm  in greatest single dimension. There is no acute intracranial hemorrhage. No demarcated cortical infarct. No midline shift. Vascular: No hyperdense vessel.  Atherosclerotic calcifications Skull: Normal. Negative for fracture or focal lesion. Other: Large right forehead/right periorbital hematoma. CT MAXILLOFACIAL FINDINGS Osseous: Medially displaced fracture of the left nasal bone, new as compared to the prior head CT of 03/25/2014, but otherwise age indeterminate. No other maxillofacial fracture is identified. Orbits: Prominent right periorbital hematoma. Otherwise, no acute finding. The globes are normal in size and contour. The extraocular muscles and optic nerve sheath complexes are symmetric and unremarkable. Sinuses: Trace scattered paranasal sinus mucosal thickening. No air-fluid levels. Soft tissues: Prominent right forehead/periorbital hematoma. Other: S-shaped deviation of the bony nasal septum. Small nasal septal defect (series 9, image 22). CT CERVICAL SPINE FINDINGS Alignment: Straightening of the expected cervical lordosis. Trace C4-C5 grade 1 anterolisthesis. Skull base and vertebrae: The basion-dental and atlanto-dental intervals are maintained.No evidence of acute fracture to the cervical spine. Prominent multilevel ventral osteophytes. Soft tissues and spinal canal: No prevertebral fluid or swelling. No visible canal hematoma. Disc levels: Cervical spondylosis with multilevel disc space narrowing, disc bulges, posterior disc osteophytes, uncovertebral hypertrophy and facet arthrosis. There is moderate (and possibly severe) no canal stenosis at C5-C6 and C6-C7. Multilevel bony neural foraminal narrowing. Upper chest: Paraseptal emphysema.  No visible pneumothorax. IMPRESSION: CT head: 1. No acute posttraumatic intracranial findings. 2. Prominent right forehead/periorbital hematoma. 3. Cystic lesion within the suprasellar region measuring 3.6 cm in greatest single dimension, unchanged in size as  compared to the head CT of 03/25/2014. Differential considerations include epidermoid, arachnoid cyst or subarachnoid adhesion. Nonemergent contrast-enhanced MRI is recommended if not already performed. 4. Mild cerebral atrophy. CT maxillofacial: 1. Medially displaced fracture of the left nasal bone, new as compared to the head CT of 03/25/2014 but otherwise age indeterminate. 2. No other maxillofacial fracture is identified. 3. Prominent right forehead/periorbital hematoma. 4. S-shaped deviation of the bony nasal septum. Small nasal septal defect. CT cervical spine: 1. No evidence of acute fracture to the cervical spine. 2. Mild chronic C4-C5 grade 1 anterolisthesis. 3. Cervical spondylosis as described. Suspect moderate (and possibly severe) spinal canal stenosis at C5-C6 and C6-C7 4.  Emphysema (ICD10-J43.9). Electronically Signed   By: Kellie Simmering DO   On: 06/29/2020 20:48    EKG: Not performed.  Assessment/Plan Principal Problem:   Hyponatremia Active Problems:   Alcohol use   Tobacco use  CESARIO WEIDINGER is a 64 y.o. male with medical history significant for alcohol and tobacco use who is admitted with hyponatremia.  Hyponatremia: Suspect related to beer potomania.  He has not had any seizure like activity or altered mental status. -S/p 1 L normal saline in the ED -Check sodium level now and q3h overnight -Start NS_0  mL/hour and adjust based on sodium trend -Goal correction is 6 mEq over first 24 hours  Alcohol use: Patient reports chronic daily alcohol use.  He denies any history of withdrawal but is certainly high risk. -Place on CIWA protocol with as needed Ativan  Hypomagnesemia: Replete with IV magnesium and recheck in a.m.  Mechanical fall resulting in right forehead/periorbital hematoma and medially displaced left nasal bone fracture: Occurring in setting of alcohol use.  Continue  fall precautions.  Tobacco use: Reports smoking 1 pack/day since age of 14.  Patient  advised on cessation.  Nicotine patch ordered.  Cystic lesion within the suprasellar region seen on CT head: Measuring 3.6 cm in greatest dimension and unchanged when compared to previous head CT 03/25/2014.  Nonemergent contrast-enhanced MRI was recommended if not already performed.   DVT prophylaxis: SCDs only for now given right periorbital hematoma Code Status: Full code, confirmed with patient Family Communication: Discussed with patient, he has discussed with family/friend Disposition Plan: From home and likely discharge to home pending adequate electrolyte abnormality correction Consults called: None Admission status:  Status is: Inpatient  Remains inpatient appropriate because:Persistent severe electrolyte disturbances   Dispo: The patient is from: Home              Anticipated d/c is to: Home              Anticipated d/c date is: 2 days              Patient currently is not medically stable to d/c.   Zada Finders MD [facility]  If 7PM-7AM, please contact night-coverage [email or web address]  06/29/2020, 10:17 PM

## 2020-06-29 NOTE — ED Provider Notes (Signed)
[facility]-EMERGENCY DEPT Provider Note   CSN: [id number] Arrival date & time: 06/29/20  1640     History Chief Complaint  Patient presents with  . Fall    Cody Glenn is a 64 y.o. male.  HPI 64 year old male with no significant medical history presents to the ER after mechanical fall.  Patient states that he was locking along the sidewalk and tripped over a curb.  He states he fell forward onto his face.  When asked if he lost consciousness, he replies "you can say that".  When asked to elaborate, he states he has EtOH on board.  Reports he has had 1 beer today.  Denies any daily drinking.  He denies any headache, nausea, vomiting, dizziness.  He has a large hematoma above his right eye, denies any blurry vision or vision disturbances.  He complains of bilateral hip pain.  States that he has not attempted to walk since the fall.  He denies any back pain, numbness or tingling in his extremities.  Denies any loss of bowel bladder control.  He is not on a blood thinner.  Denies any chest pain, shortness of breath, dizziness at the time of the fall.   History reviewed. No pertinent past medical history.  There are no problems to display for this patient.   History reviewed. No pertinent surgical history.     History reviewed. No pertinent family history.  Social History   Tobacco Use  . Smoking status: Not on file  Substance Use Topics  . Alcohol use: Not on file  . Drug use: Not on file    Home Medications Prior to Admission medications   Not on File    Allergies    Patient has no known allergies.  Review of Systems   Review of Systems  Constitutional: Negative for chills and fever.  HENT: Positive for facial swelling. Negative for ear pain and sore throat.   Eyes: Negative for photophobia, pain and visual disturbance.  Respiratory: Negative for cough and shortness of breath.   Cardiovascular: Negative for chest pain and palpitations.    Gastrointestinal: Negative for abdominal pain and vomiting.  Genitourinary: Negative for dysuria and hematuria.  Musculoskeletal: Positive for arthralgias. Negative for back pain.  Skin: Negative for color change and rash.  Neurological: Negative for seizures, syncope, weakness and numbness.  All other systems reviewed and are negative.   Physical Exam Updated Vital Signs BP (!) 166/95   Pulse 73   Temp (P) 98.4 F (36.9 C) (Oral)   Resp 20   SpO2 98%   Physical Exam Vitals and nursing note reviewed.  Constitutional:      General: He is not in acute distress.    Appearance: He is well-developed. He is not ill-appearing, toxic-appearing or diaphoretic.  HENT:     Head: Normocephalic and atraumatic.      Comments: Large golf ball size hematoma above the right eye in the area of the eyebrow.  Pupils are equal and reactive, no evidence of hyphema.  EOMs intact. Eyes:     Conjunctiva/sclera: Conjunctivae normal.  Neck:     Comments: No midline tenderness to the C, T, L-spine.  Full range of motion of neck. Cardiovascular:     Rate and Rhythm: Normal rate and regular rhythm.     Pulses: Normal pulses.     Heart sounds: No murmur heard.      Comments: 2+ radial pulses bilaterally Pulmonary:     Effort: Pulmonary effort  is normal. No respiratory distress.     Breath sounds: Normal breath sounds.  Abdominal:     General: Abdomen is flat.     Palpations: Abdomen is soft.     Tenderness: There is no abdominal tenderness.  Musculoskeletal:        General: Swelling and tenderness present. No deformity.     Cervical back: Neck supple.     Right lower leg: No edema.     Left lower leg: No edema.     Comments: Full range of motion of hips bilaterally, 5/5 strength.  No visible deformities, bruising, crepitus or step-offs.  No evidence of leg shortening, eversion or inversion.  Skin:    General: Skin is warm and dry.     Capillary Refill: Capillary refill takes less than 2  seconds.  Neurological:     General: No focal deficit present.     Mental Status: He is alert and oriented to person, place, and time.     ED Results / Procedures / Treatments   Labs (all labs ordered are listed, but only abnormal results are displayed) Labs Reviewed  CBC WITH DIFFERENTIAL/PLATELET - Abnormal; Notable for the following components:      Result Value   RBC 3.84 (*)    HCT 35.5 (*)    MCH 34.4 (*)    MCHC 37.2 (*)    Neutro Abs 7.9 (*)    All other components within normal limits  COMPREHENSIVE METABOLIC PANEL - Abnormal; Notable for the following components:   Sodium 111 (*)    Chloride 79 (*)    CO2 18 (*)    Creatinine, Ser 0.46 (*)    Calcium 8.5 (*)    AST 64 (*)    Total Bilirubin 2.9 (*)    All other components within normal limits  ETHANOL - Abnormal; Notable for the following components:   Alcohol, Ethyl (B) 14 (*)    All other components within normal limits  URINALYSIS, ROUTINE W REFLEX MICROSCOPIC - Abnormal; Notable for the following components:   Color, Urine AMBER (*)    Ketones, ur 20 (*)    Protein, ur 100 (*)    Bacteria, UA RARE (*)    All other components within normal limits  RESP PANEL BY RT-PCR (FLU A&B, COVID) ARPGX2  RAPID URINE DRUG SCREEN, HOSP PERFORMED  MAGNESIUM  URIC ACID, RANDOM URINE  CREATININE, URINE, RANDOM  OSMOLALITY  OSMOLALITY, URINE    EKG None  Radiology CT Head Wo Contrast  Result Date: 06/29/2020 CLINICAL DATA:  Facial trauma. Additional history provided: Status post mechanical fall off curb, bilateral hip pain, head trauma. EXAM: CT HEAD WITHOUT CONTRAST CT MAXILLOFACIAL WITHOUT CONTRAST CT CERVICAL SPINE WITHOUT CONTRAST TECHNIQUE: Multidetector CT imaging of the head, cervical spine, and maxillofacial structures were performed using the standard protocol without intravenous contrast. Multiplanar CT image reconstructions of the cervical spine and maxillofacial structures were also generated. COMPARISON:   CT head/cervical spine 03/25/2014. FINDINGS: CT HEAD FINDINGS Brain: Mild generalized cerebral atrophy. Redemonstrated CSF density cystic lesion in the suprasellar region. This is unchanged in size as compared to the head CT of 03/25/2014, again measuring up to 3.6 cm in greatest single dimension. There is no acute intracranial hemorrhage. No demarcated cortical infarct. No midline shift. Vascular: No hyperdense vessel.  Atherosclerotic calcifications Skull: Normal. Negative for fracture or focal lesion. Other: Large right forehead/right periorbital hematoma. CT MAXILLOFACIAL FINDINGS Osseous: Medially displaced fracture of the left nasal bone, new as compared to  the prior head CT of 03/25/2014, but otherwise age indeterminate. No other maxillofacial fracture is identified. Orbits: Prominent right periorbital hematoma. Otherwise, no acute finding. The globes are normal in size and contour. The extraocular muscles and optic nerve sheath complexes are symmetric and unremarkable. Sinuses: Trace scattered paranasal sinus mucosal thickening. No air-fluid levels. Soft tissues: Prominent right forehead/periorbital hematoma. Other: S-shaped deviation of the bony nasal septum. Small nasal septal defect (series 9, image 22). CT CERVICAL SPINE FINDINGS Alignment: Straightening of the expected cervical lordosis. Trace C4-C5 grade 1 anterolisthesis. Skull base and vertebrae: The basion-dental and atlanto-dental intervals are maintained.No evidence of acute fracture to the cervical spine. Prominent multilevel ventral osteophytes. Soft tissues and spinal canal: No prevertebral fluid or swelling. No visible canal hematoma. Disc levels: Cervical spondylosis with multilevel disc space narrowing, disc bulges, posterior disc osteophytes, uncovertebral hypertrophy and facet arthrosis. There is moderate (and possibly severe) no canal stenosis at C5-C6 and C6-C7. Multilevel bony neural foraminal narrowing. Upper chest: Paraseptal  emphysema.  No visible pneumothorax. IMPRESSION: CT head: 1. No acute posttraumatic intracranial findings. 2. Prominent right forehead/periorbital hematoma. 3. Cystic lesion within the suprasellar region measuring 3.6 cm in greatest single dimension, unchanged in size as compared to the head CT of 03/25/2014. Differential considerations include epidermoid, arachnoid cyst or subarachnoid adhesion. Nonemergent contrast-enhanced MRI is recommended if not already performed. 4. Mild cerebral atrophy. CT maxillofacial: 1. Medially displaced fracture of the left nasal bone, new as compared to the head CT of 03/25/2014 but otherwise age indeterminate. 2. No other maxillofacial fracture is identified. 3. Prominent right forehead/periorbital hematoma. 4. S-shaped deviation of the bony nasal septum. Small nasal septal defect. CT cervical spine: 1. No evidence of acute fracture to the cervical spine. 2. Mild chronic C4-C5 grade 1 anterolisthesis. 3. Cervical spondylosis as described. Suspect moderate (and possibly severe) spinal canal stenosis at C5-C6 and C6-C7 4.  Emphysema (ICD10-J43.9). Electronically Signed   By: Jackey Loge DO   On: 06/29/2020 20:48   CT Cervical Spine Wo Contrast  Result Date: 06/29/2020 CLINICAL DATA:  Facial trauma. Additional history provided: Status post mechanical fall off curb, bilateral hip pain, head trauma. EXAM: CT HEAD WITHOUT CONTRAST CT MAXILLOFACIAL WITHOUT CONTRAST CT CERVICAL SPINE WITHOUT CONTRAST TECHNIQUE: Multidetector CT imaging of the head, cervical spine, and maxillofacial structures were performed using the standard protocol without intravenous contrast. Multiplanar CT image reconstructions of the cervical spine and maxillofacial structures were also generated. COMPARISON:  CT head/cervical spine 03/25/2014. FINDINGS: CT HEAD FINDINGS Brain: Mild generalized cerebral atrophy. Redemonstrated CSF density cystic lesion in the suprasellar region. This is unchanged in size as  compared to the head CT of 03/25/2014, again measuring up to 3.6 cm in greatest single dimension. There is no acute intracranial hemorrhage. No demarcated cortical infarct. No midline shift. Vascular: No hyperdense vessel.  Atherosclerotic calcifications Skull: Normal. Negative for fracture or focal lesion. Other: Large right forehead/right periorbital hematoma. CT MAXILLOFACIAL FINDINGS Osseous: Medially displaced fracture of the left nasal bone, new as compared to the prior head CT of 03/25/2014, but otherwise age indeterminate. No other maxillofacial fracture is identified. Orbits: Prominent right periorbital hematoma. Otherwise, no acute finding. The globes are normal in size and contour. The extraocular muscles and optic nerve sheath complexes are symmetric and unremarkable. Sinuses: Trace scattered paranasal sinus mucosal thickening. No air-fluid levels. Soft tissues: Prominent right forehead/periorbital hematoma. Other: S-shaped deviation of the bony nasal septum. Small nasal septal defect (series 9, image 22). CT CERVICAL SPINE FINDINGS Alignment: Straightening  of the expected cervical lordosis. Trace C4-C5 grade 1 anterolisthesis. Skull base and vertebrae: The basion-dental and atlanto-dental intervals are maintained.No evidence of acute fracture to the cervical spine. Prominent multilevel ventral osteophytes. Soft tissues and spinal canal: No prevertebral fluid or swelling. No visible canal hematoma. Disc levels: Cervical spondylosis with multilevel disc space narrowing, disc bulges, posterior disc osteophytes, uncovertebral hypertrophy and facet arthrosis. There is moderate (and possibly severe) no canal stenosis at C5-C6 and C6-C7. Multilevel bony neural foraminal narrowing. Upper chest: Paraseptal emphysema.  No visible pneumothorax. IMPRESSION: CT head: 1. No acute posttraumatic intracranial findings. 2. Prominent right forehead/periorbital hematoma. 3. Cystic lesion within the suprasellar region  measuring 3.6 cm in greatest single dimension, unchanged in size as compared to the head CT of 03/25/2014. Differential considerations include epidermoid, arachnoid cyst or subarachnoid adhesion. Nonemergent contrast-enhanced MRI is recommended if not already performed. 4. Mild cerebral atrophy. CT maxillofacial: 1. Medially displaced fracture of the left nasal bone, new as compared to the head CT of 03/25/2014 but otherwise age indeterminate. 2. No other maxillofacial fracture is identified. 3. Prominent right forehead/periorbital hematoma. 4. S-shaped deviation of the bony nasal septum. Small nasal septal defect. CT cervical spine: 1. No evidence of acute fracture to the cervical spine. 2. Mild chronic C4-C5 grade 1 anterolisthesis. 3. Cervical spondylosis as described. Suspect moderate (and possibly severe) spinal canal stenosis at C5-C6 and C6-C7 4.  Emphysema (ICD10-J43.9). Electronically Signed   By: Jackey Loge DO   On: 06/29/2020 20:48   DG Hip Unilat With Pelvis 2-3 Views Left  Result Date: 06/29/2020 CLINICAL DATA:  Hip pain history of fall EXAM: DG HIP (WITH OR WITHOUT PELVIS) 2-3V LEFT COMPARISON:  None. FINDINGS: SI joints are non widened. The pubic symphysis and rami appear intact. No acute displaced fracture or malalignment. 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 Vascular calcifications. IMPRESSION: No acute osseous abnormality. Electronically Signed   By: Jasmine Pang M.D.   On: 06/29/2020 18:34   DG Hip Unilat  With Pelvis 2-3 Views Right  Result Date: 06/29/2020 CLINICAL DATA:  Hip pain after fall EXAM: DG HIP (WITH OR WITHOUT PELVIS) 2-3V RIGHT COMPARISON:  None. FINDINGS: No fracture or malalignment. Moderate arthritis of the right hip. Right pubic rami appear intact. IMPRESSION: No acute osseous abnormality. Electronically Signed   By: Jasmine Pang M.D.   On: 06/29/2020 18:32   CT Maxillofacial Wo Contrast  Result Date: 06/29/2020 CLINICAL DATA:  Facial trauma. Additional  history provided: Status post mechanical fall off curb, bilateral hip pain, head trauma. EXAM: CT HEAD WITHOUT CONTRAST CT MAXILLOFACIAL WITHOUT CONTRAST CT CERVICAL SPINE WITHOUT CONTRAST TECHNIQUE: Multidetector CT imaging of the head, cervical spine, and maxillofacial structures were performed using the standard protocol without intravenous contrast. Multiplanar CT image reconstructions of the cervical spine and maxillofacial structures were also generated. COMPARISON:  CT head/cervical spine 03/25/2014. FINDINGS: CT HEAD FINDINGS Brain: Mild generalized cerebral atrophy. Redemonstrated CSF density cystic lesion in the suprasellar region. This is unchanged in size as compared to the head CT of 03/25/2014, again measuring up to 3.6 cm in greatest single dimension. There is no acute intracranial hemorrhage. No demarcated cortical infarct. No midline shift. Vascular: No hyperdense vessel.  Atherosclerotic calcifications Skull: Normal. Negative for fracture or focal lesion. Other: Large right forehead/right periorbital hematoma. CT MAXILLOFACIAL FINDINGS Osseous: Medially displaced fracture of the left nasal bone, new as compared to the prior head CT of 03/25/2014, but otherwise age indeterminate. No other maxillofacial fracture is identified. Orbits:  Prominent right periorbital hematoma. Otherwise, no acute finding. The globes are normal in size and contour. The extraocular muscles and optic nerve sheath complexes are symmetric and unremarkable. Sinuses: Trace scattered paranasal sinus mucosal thickening. No air-fluid levels. Soft tissues: Prominent right forehead/periorbital hematoma. Other: S-shaped deviation of the bony nasal septum. Small nasal septal defect (series 9, image 22). CT CERVICAL SPINE FINDINGS Alignment: Straightening of the expected cervical lordosis. Trace C4-C5 grade 1 anterolisthesis. Skull base and vertebrae: The basion-dental and atlanto-dental intervals are maintained.No evidence of acute  fracture to the cervical spine. Prominent multilevel ventral osteophytes. Soft tissues and spinal canal: No prevertebral fluid or swelling. No visible canal hematoma. Disc levels: Cervical spondylosis with multilevel disc space narrowing, disc bulges, posterior disc osteophytes, uncovertebral hypertrophy and facet arthrosis. There is moderate (and possibly severe) no canal stenosis at C5-C6 and C6-C7. Multilevel bony neural foraminal narrowing. Upper chest: Paraseptal emphysema.  No visible pneumothorax. IMPRESSION: CT head: 1. No acute posttraumatic intracranial findings. 2. Prominent right forehead/periorbital hematoma. 3. Cystic lesion within the suprasellar region measuring 3.6 cm in greatest single dimension, unchanged in size as compared to the head CT of 03/25/2014. Differential considerations include epidermoid, arachnoid cyst or subarachnoid adhesion. Nonemergent contrast-enhanced MRI is recommended if not already performed. 4. Mild cerebral atrophy. CT maxillofacial: 1. Medially displaced fracture of the left nasal bone, new as compared to the head CT of 03/25/2014 but otherwise age indeterminate. 2. No other maxillofacial fracture is identified. 3. Prominent right forehead/periorbital hematoma. 4. S-shaped deviation of the bony nasal septum. Small nasal septal defect. CT cervical spine: 1. No evidence of acute fracture to the cervical spine. 2. Mild chronic C4-C5 grade 1 anterolisthesis. 3. Cervical spondylosis as described. Suspect moderate (and possibly severe) spinal canal stenosis at C5-C6 and C6-C7 4.  Emphysema (ICD10-J43.9). Electronically Signed   By: Jackey Loge DO   On: 06/29/2020 20:48    Procedures Procedures (including critical care time)  Medications Ordered in ED Medications  sodium chloride 0.9 % bolus 1,000 mL (1,000 mLs Intravenous New Bag/Given 06/29/20 2122)    ED Course  I have reviewed the triage vital signs and the nursing notes.  Pertinent labs & imaging results that  were available during my care of the patient were reviewed by me and considered in my medical decision making (see chart for details).    MDM Rules/Calculators/A&P                          62:21 PM:  64 year old male presents to the ER after mechanical fall.  EtOH on board.  Vitals overall reassuring.  Physical exam with visible large hematoma above right eye, no evidence of hyphema.  He has no C, T, L-spine tenderness.  He complains of bilateral hip pain but has full range of motion and strength.  No evidence of leg shortening, eversion or inversion.  2+ radial and DP pulses bilaterally.  Chest and abdomen nontender, no bruising.  Given his age, alcohol level, and visible hematoma above the right eye, will order CT of the head, maxillofacial and CT of the spine to rule out injury.  Plain films of the pelvis ordered in triage.  We will also order basic labs, ethanol, UDS, urine.  9:03 PM:  Labs reviewed and interpreted by me -BC without leukocytosis, normal hemoglobin. -CMP with a critical sodium of 111. Creatinine of 0.46. Mildly elevated AST at 64. -UA with ketones and protein, no blood or evidence of UTI.  Imaging reviewed by myself and radiology -Plain films of the hips without evidence of fracture -CT of the head with cystic lesion in the suprasellar region unchanged from CTA in 2015. No evidence of bleed. -CT of the C-spine without evidence of acute fracture -CT maxillofacial confirming large right hematoma above eye. He does have a medially displaced left nasal bone fracture  MDM: Patient with severe hyponatremia in the setting of alcohol use and elevated LFT. Suspect beer potomania, patient is not seizing, no AMS, A&Ox3. Will start on normal saline, magnesium, uric acid, osmolality, urine creatinine and Covid test ordered. Consulted hospitalist for admission.  Spoke with hospitalist Dr. Allena Katz who will admit the patient for further management and treatment   Final Clinical  Impression(s) / ED Diagnoses Final diagnoses:  Fall, initial encounter  Closed fracture of nasal bone, initial encounter  Hyponatremia    Rx / DC Orders ED Discharge Orders    None       Leone Brand 06/29/20 2126    Sabas Sous, MD 06/29/20 2344

## 2020-06-29 NOTE — ED Notes (Signed)
Date and time results received: 06/29/20 2012   Test: Sodium Critical Value: 111  Name of Provider Notified: Pilar Plate, MD

## 2020-06-29 NOTE — ED Notes (Signed)
Attempted to call report to receiving unit, no response at this time, will try again in 5 minutes.

## 2020-06-29 NOTE — ED Notes (Signed)
Pt to xray

## 2020-06-29 NOTE — ED Triage Notes (Addendum)
Pt arrived via EMS, s/p mechanical fall off curb, c/o bilateral hip pain. Hit head. Denies any LOC. No blood thinners.

## 2020-06-29 NOTE — ED Notes (Signed)
Report called to Danielle RN.

## 2020-06-30 DIAGNOSIS — E440 Moderate protein-calorie malnutrition: Secondary | ICD-10-CM | POA: Insufficient documentation

## 2020-06-30 DIAGNOSIS — R748 Abnormal levels of other serum enzymes: Secondary | ICD-10-CM

## 2020-06-30 DIAGNOSIS — W19XXXA Unspecified fall, initial encounter: Secondary | ICD-10-CM

## 2020-06-30 DIAGNOSIS — Z7289 Other problems related to lifestyle: Secondary | ICD-10-CM

## 2020-06-30 DIAGNOSIS — Y92009 Unspecified place in unspecified non-institutional (private) residence as the place of occurrence of the external cause: Secondary | ICD-10-CM

## 2020-06-30 DIAGNOSIS — Z720 Tobacco use: Secondary | ICD-10-CM

## 2020-06-30 DIAGNOSIS — T148XXA Other injury of unspecified body region, initial encounter: Secondary | ICD-10-CM

## 2020-06-30 DIAGNOSIS — S022XXA Fracture of nasal bones, initial encounter for closed fracture: Secondary | ICD-10-CM

## 2020-06-30 LAB — OSMOLALITY, URINE: Osmolality, Ur: 630 mOsm/kg (ref 300–900)

## 2020-06-30 LAB — CBC
HCT: 35.5 % — ABNORMAL LOW (ref 39.0–52.0)
Hemoglobin: 13.1 g/dL (ref 13.0–17.0)
MCH: 34.8 pg — ABNORMAL HIGH (ref 26.0–34.0)
MCHC: 36.9 g/dL — ABNORMAL HIGH (ref 30.0–36.0)
MCV: 94.4 fL (ref 80.0–100.0)
Platelets: 229 10*3/uL (ref 150–400)
RBC: 3.76 MIL/uL — ABNORMAL LOW (ref 4.22–5.81)
RDW: 11.9 % (ref 11.5–15.5)
WBC: 8.5 10*3/uL (ref 4.0–10.5)
nRBC: 0 % (ref 0.0–0.2)

## 2020-06-30 LAB — IRON AND TIBC
Iron: 318 ug/dL — ABNORMAL HIGH (ref 45–182)
Saturation Ratios: 87 % — ABNORMAL HIGH (ref 17.9–39.5)
TIBC: 367 ug/dL (ref 250–450)
UIBC: 49 ug/dL

## 2020-06-30 LAB — COMPREHENSIVE METABOLIC PANEL
ALT: 24 U/L (ref 0–44)
AST: 59 U/L — ABNORMAL HIGH (ref 15–41)
Albumin: 3.6 g/dL (ref 3.5–5.0)
Alkaline Phosphatase: 78 U/L (ref 38–126)
Anion gap: 12 (ref 5–15)
BUN: 8 mg/dL (ref 8–23)
CO2: 21 mmol/L — ABNORMAL LOW (ref 22–32)
Calcium: 8.5 mg/dL — ABNORMAL LOW (ref 8.9–10.3)
Chloride: 87 mmol/L — ABNORMAL LOW (ref 98–111)
Creatinine, Ser: 0.58 mg/dL — ABNORMAL LOW (ref 0.61–1.24)
GFR, Estimated: >60 mL/min (ref >60)
Glucose, Bld: 76 mg/dL (ref 70–99)
Potassium: 4.7 mmol/L (ref 3.5–5.1)
Sodium: 120 mmol/L — ABNORMAL LOW (ref 135–145)
Total Bilirubin: 3.9 mg/dL — ABNORMAL HIGH (ref 0.3–1.2)
Total Protein: 6.8 g/dL (ref 6.5–8.1)

## 2020-06-30 LAB — SODIUM
Sodium: 116 mmol/L — CL (ref 135–145)
Sodium: 120 mmol/L — ABNORMAL LOW (ref 135–145)
Sodium: 123 mmol/L — ABNORMAL LOW (ref 135–145)
Sodium: 124 mmol/L — ABNORMAL LOW (ref 135–145)

## 2020-06-30 LAB — OSMOLALITY: Osmolality: 238 mOsm/kg — CL (ref 275–295)

## 2020-06-30 LAB — FOLATE: Folate: 9.4 ng/mL (ref >5.9)

## 2020-06-30 LAB — RETICULOCYTES
Immature Retic Fract: 11.6 % (ref 2.3–15.9)
RBC.: 3.82 MIL/uL — ABNORMAL LOW (ref 4.22–5.81)
Retic Count, Absolute: 63 10*3/uL (ref 19.0–186.0)
Retic Ct Pct: 1.7 % (ref 0.4–3.1)

## 2020-06-30 LAB — BILIRUBIN, FRACTIONATED(TOT/DIR/INDIR)
Bilirubin, Direct: 0.6 mg/dL — ABNORMAL HIGH (ref 0.0–0.2)
Indirect Bilirubin: 2.9 mg/dL — ABNORMAL HIGH (ref 0.3–0.9)
Total Bilirubin: 3.5 mg/dL — ABNORMAL HIGH (ref 0.3–1.2)

## 2020-06-30 LAB — FERRITIN: Ferritin: 190 ng/mL (ref 24–336)

## 2020-06-30 LAB — HIV ANTIBODY (ROUTINE TESTING W REFLEX): HIV Screen 4th Generation wRfx: NONREACTIVE

## 2020-06-30 LAB — MRSA PCR SCREENING: MRSA by PCR: NEGATIVE

## 2020-06-30 LAB — VITAMIN B12: Vitamin B-12: 107 pg/mL — ABNORMAL LOW (ref 180–914)

## 2020-06-30 LAB — MAGNESIUM: Magnesium: 2.3 mg/dL (ref 1.7–2.4)

## 2020-06-30 LAB — PHOSPHORUS: Phosphorus: 3.4 mg/dL (ref 2.5–4.6)

## 2020-06-30 MED ORDER — CLONIDINE HCL 0.1 MG PO TABS
0.1000 mg | ORAL_TABLET | Freq: Two times a day (BID) | ORAL | Status: DC
  Administered 2020-06-30 – 2020-07-06 (×13): 0.1 mg via ORAL
  Filled 2020-06-30 (×13): qty 1

## 2020-06-30 MED ORDER — CYANOCOBALAMIN 1000 MCG/ML IJ SOLN
1000.0000 ug | Freq: Every day | INTRAMUSCULAR | Status: AC
  Administered 2020-06-30 – 2020-07-06 (×7): 1000 ug via INTRAMUSCULAR
  Filled 2020-06-30 (×7): qty 1

## 2020-06-30 MED ORDER — GABAPENTIN 300 MG PO CAPS
300.0000 mg | ORAL_CAPSULE | Freq: Three times a day (TID) | ORAL | Status: DC
  Administered 2020-06-30 – 2020-07-06 (×19): 300 mg via ORAL
  Filled 2020-06-30 (×19): qty 1

## 2020-06-30 MED ORDER — HYDRALAZINE HCL 20 MG/ML IJ SOLN: 10.0000 mg | INTRAMUSCULAR | Status: DC | PRN

## 2020-06-30 NOTE — Progress Notes (Signed)
CRITICAL VALUE ALERT  Critical Value:  Serum osmolality 238  Date & Time Notied:  11/23 @ 0430  Provider Notified: Reyes Ivan via amion  Orders Received/Actions taken: awaiting new orders

## 2020-06-30 NOTE — Plan of Care (Signed)
RN will continue to monitor patient's progression of care plan.  

## 2020-06-30 NOTE — Progress Notes (Signed)
CRITICAL VALUE ALERT  Critical Value:  Sodium 116   Date & Time Notied:  06/30/20 @ 0220  Provider Notified: E ouma via amion  Orders Received/Actions taken: awaiting new orders

## 2020-06-30 NOTE — Progress Notes (Signed)
Upon AM assessment, this RN noticed patient's bedside monitor alarming ST elevation. V lead appeared to be malpositioned, and when repositioned by RN, bedside monitor stopped alarming. Charge RN also assessed pt at bedside. EKG performed, and interpretation showed possible septal infarct. This RN notified MD; awaiting orders at this time.   MD also notified by RN of persistent HTN at rest. This RN will continue to carefully monitor pt while waiting on orders.

## 2020-06-30 NOTE — TOC Initial Note (Signed)
Transition of Care [facility]) - Initial/Assessment Note    Patient Details  Name: Cody Glenn MRN: [id number] Date of Birth: 06-25-1956  Transition of Care [facility]) CM/SW Contact:    Golda Acre, RN Phone Number: 06/30/2020, 7:33 AM  Clinical Narrative:                 64 y.o. male with medical history significant for alcohol and tobacco use who presents to the ED for evaluation after a fall.  Patient states he was outside when he was walking along the sidewalk when he tripped over the curb.  He states fell forward onto his face.  He says he thinks he lost consciousness temporarily.  He says he has been feeling lightheaded all day.  He has been having nausea and vomiting and some shortness of breath.  He reports a chronic cough which is unchanged.  He has not had any chest pain, abdominal pain, dysuria, diarrhea, seizure activity, loss of bowel/bladder control.  Patient does report chronic alcohol use, at least 3 quarts of beer daily with unspecified amount of liquor as well.  He is a chronic smoker of 1 pack/day since age of 69.  He denies any marijuana, cocaine, or IV drug use.  He reports a history of bleeding stomach ulcer requiring surgery over 30 years ago.  He is unaware of any other chronic medical conditions.  He is not currently taking any medications.  [facility]  He is unaware of any medical conditions in his immediate family. Plan is to return to home , does not have a pcp-will need follow up appt and pcp, Following for progression. Expected Discharge Plan: Home/Self Care Barriers to Discharge: No Barriers Identified   Patient Goals and CMS Choice Patient states their goals for this hospitalization and ongoing recovery are:: to go home CMS Medicare.gov Compare Post Acute Care list provided to:: Patient    Expected Discharge Plan and Services Expected Discharge Plan: Home/Self Care In-house Referral: Financial  Counselor Discharge Planning Services: CM Consult, Medication Assistance, Other - See comment (substance abuse resources)   Living arrangements for the past 2 months: Single Family Home                                      Prior Living Arrangements/Services Living arrangements for the past 2 months: Single Family Home Lives with:: Self Patient language and need for interpreter reviewed:: Yes Do you feel safe going back to the place where you live?: Yes      Need for Family Participation in Patient Care: Yes (Comment) (brother) Care giver support system in place?: Yes (comment) (brother)   Criminal Activity/Legal Involvement Pertinent to Current Situation/Hospitalization: No - Comment as needed  Activities of Daily Living Home Assistive Devices/Equipment: None ADL Screening (condition at time of admission) Patient's cognitive ability adequate to safely complete daily activities?: Yes Is the patient deaf or have difficulty hearing?: No Does the patient have difficulty seeing, even when wearing glasses/contacts?: No Does the patient have difficulty concentrating, remembering, or making decisions?: No Patient able to express need for assistance with ADLs?: Yes Does the patient have difficulty dressing or bathing?: No Independently performs ADLs?: No Communication: Independent Dressing (OT): Needs assistance Is this a change from baseline?: Change from baseline, expected to last >3 days Grooming: Independent Feeding: Independent Bathing: Needs assistance Is this a  change from baseline?: Change from baseline, expected to last >3 days Toileting: Needs assistance Is this a change from baseline?: Change from baseline, expected to last >3days Walks in Home: Needs assistance Is this a change from baseline?: Change from baseline, expected to last >3 days Does the patient have difficulty walking or climbing stairs?: Yes Weakness of Legs: Both Weakness of Arms/Hands:  Both  Permission Sought/Granted                  Emotional Assessment Appearance:: Appears stated age Attitude/Demeanor/Rapport: Apprehensive Affect (typically observed): Calm Orientation: : Oriented to Place, Oriented to Self, Oriented to  Time, Oriented to Situation Alcohol / Substance Use: Alcohol Use Psych Involvement: No (comment)  Admission diagnosis:  Hyponatremia [E87.1] Closed fracture of nasal bone, initial encounter [S02.2XXA] Fall, initial encounter [id number]] Patient Active Problem List   Diagnosis Date Noted  . Hyponatremia 06/29/2020  . Alcohol use 06/29/2020  . Tobacco use 06/29/2020   PCP:  Patient, No Pcp Per Pharmacy:   [facility] [address], [place] - [address] [place] [address] Phone: [phone] Fax: [phone]     Social Determinants of Health (SDOH) Interventions    Readmission Risk Interventions No flowsheet data found.

## 2020-06-30 NOTE — Plan of Care (Signed)
  Problem: Safety: Goal: Ability to remain free from injury will improve Outcome: Progressing   Problem: Education: Goal: Knowledge of disease or condition will improve Outcome: Progressing   

## 2020-06-30 NOTE — Progress Notes (Signed)
Initial Nutrition Assessment  DOCUMENTATION CODES:   Non-severe (moderate) malnutrition in context of social or environmental circumstances  INTERVENTION:  - continue Ensure Enlive BID, each supplement provides 350 kcal and 20 grams of protein.   NUTRITION DIAGNOSIS:   Moderate Malnutrition related to social / environmental circumstances (alcohol abuse) as evidenced by mild fat depletion, mild muscle depletion, moderate muscle depletion.  GOAL:   Patient will meet greater than or equal to 90% of their needs  MONITOR:   PO intake, Supplement acceptance, Labs, Weight trends  REASON FOR ASSESSMENT:   Malnutrition Screening Tool  ASSESSMENT:   64 y.o. male with history of alcohol abuse and tobacco use. He presented to the ED after a fall while outside; he was walking on the sidewalk and tripped over the curb. He thinks he lost consciousness temporarily. He was experiencing N/V and SOB PTA and has a chronic cough. He smokes 1 pack of cigarettes/day and drinks 3 quarts of beer/day.  Patient ate 80% of breakfast this AM (620 kcal, 18 grams protein). Able to talk with RN who reports patient ate well for breakfast and that he accepted and drank bottle of Ensure offered to him.   Patient laying in bed and was somewhat drowsy throughout visit. He confirms eating well for breakfast and that he has some slight nausea after meal. He does not have any difficulty or pain with chewing or swallowing at baseline.  He confirms drinking alcohol daily and states that he only drinks beer, does not like liquor. He initially states drinking 3 12-packs/day but then states he drinks 18 beers/day.   Patient reports that for 3-4 days PTA he had a very poor appetite and was experiencing N/V. He states that for ~1 month PTA he would need to have a BM very shortly after eating. He is unsure of anything in particular that caused this other than coffee.   He reports UBW of 165-170 lb and that he believes he is  now down to 120-140 lb. Weight yesterday was 135 lb and is the only weight recording available in the chart. He states the last time he weighed himself at home that he was 165 lb was 3 months ago. This indicates 30 lb weight loss (18% body weight) in 3 months; significant for time frame.    Labs reviewed; Na: 123 mmol/l, Cl: 87 mmol/l, creatinine: 0.58 mg/dl, Ca: 8.5 mg/dl. Medications reviewed; 1 mg folvite/day, 2 g IV Mg sulfate x1 run 11/22, 1 tablet multivitamin with minerals/day, 100 mg oral thiamine/day.     NUTRITION - FOCUSED PHYSICAL EXAM:    Most Recent Value  Orbital Region No depletion  Upper Arm Region Mild depletion  Thoracic and Lumbar Region Unable to assess  Buccal Region No depletion  Temple Region Mild depletion  Clavicle Bone Region Moderate depletion  Clavicle and Acromion Bone Region Moderate depletion  Scapular Bone Region No depletion  Dorsal Hand Mild depletion  Patellar Region Mild depletion  Anterior Thigh Region Unable to assess  Posterior Calf Region No depletion  Edema (RD Assessment) None  Hair Reviewed  Eyes Other (Comment)  [R eye is swollen and remained shut during visit]  Mouth Reviewed  Skin Reviewed  Nails Reviewed       Diet Order:   Diet Order            Diet regular Room service appropriate? Yes; Fluid consistency: Thin  Diet effective now  EDUCATION NEEDS:   No education needs have been identified at this time  Skin:  Skin Assessment: Reviewed RN Assessment  Last BM:  11/23 (type 5)  Height:   Ht Readings from Last 1 Encounters:  06/29/20 5\' 5"  (1.651 m)    Weight:   Wt Readings from Last 1 Encounters:  06/29/20 61.3 kg    Estimated Nutritional Needs:  Kcal:  1840-2000 kcal Protein:  80-90 grams Fluid:  >/= 2 L/day     07/01/20, MS, RD, LDN, CNSC Inpatient Clinical Dietitian RD pager # available in [facility]  After hours/weekend pager # available in [facility]

## 2020-06-30 NOTE — Progress Notes (Signed)
PROGRESS NOTE  Cody Glenn ZOX:[id number] DOB: [birth year]   PCP: Patient, No Pcp Per  Patient is from: Home.  Lives alone.  DOA: 06/29/2020 LOS: 1  Chief complaints: Fall  Brief Narrative / Interim history: 65 year old male with history of alcohol and tobacco use disorder brought to ED after he had a fall with head impaction and possible transient LOC.  Reportedly tripped over the curb while walking on the sidewalk and fell.  Drinks about three quarts of beer and an unspecified amount of liquor daily.  Also smokes about a pack a day since the age of 57.  In ED, slightly hypertensive.  Significant labs include: Na 111, AST 64, total bili 2.9, EtOH level 14, MG 1.6.  Bilateral hip x-ray without acute finding.  CT head/maxillofacial and C-spine revealed right forehead/periorbital hematoma, medially displaced fracture of left nasal bone and stable cystic lesion within the suprasellar region measuring about 3.6 cm.  Started on NS, and admitted for hyponatremia, EtOH intoxication/withdrawal.  Subjective: Seen and examined earlier this morning.  No major events overnight of this morning.  Complains of bilateral lower extremity pain that he describes as numbness and tingling.  Not dermatomal.  Denies headache, vision change, chest pain, dyspnea, palpitation, GI or UTI symptoms.  Denies audiovisual hallucination.   Objective: Vitals:   06/30/20 0900 06/30/20 0944 06/30/20 0956 06/30/20 1000  BP:  123/81 123/81 131/77  Pulse: (!) 103 (!) 106  (!) 102  Resp: Temp:      TempSrc:      SpO2: 100% 98%  98%  Weight:      Height:        Intake/Output Summary (Last 24 hours) at 06/30/2020 1006 Last data filed at 06/30/2020 1003 Gross per 24 hour  Intake 677.38 ml  Output 400 ml  Net 277.38 ml   Filed Weights   06/29/20 2300  Weight: 61.3 kg    Examination:  GENERAL: No apparent distress.  Nontoxic. HEENT: MMM.  Vision grossly intact.  EOMI in both eyes.  Significant  swelling of right upper eye lids. Slight dent in left nose. Nares patent. No bleeding NECK: Supple.  No apparent JVD.  RESP: HR in 110s.  No IWOB.  Fair aeration bilaterally. CVS:  RRR. Heart sounds normal.  ABD/GI/GU: BS+. Abd soft, NTND.  MSK/EXT:  Moves extremities. No apparent deformity. No edema.  SKIN: no apparent skin lesion or wound NEURO: Awake, alert and oriented appropriately.  No apparent focal neuro deficit. PSYCH: Calm. Normal affect.  Procedures:  None  Microbiology summarized: COVID-19, influenza and RSV PCR nonreactive.   Assessment & Plan: Hyponatremia-likely beer potmania due to alcohol. Na 111>120 within 12 hours.  No focal neuro symptoms -Discontinue IV NS -Monitor Na q6h  Elevated AST-likely due to alcohol.  Improved. -Check CK  Hyperbilirubinemia: Tbili 2.9> 3.9.  Due to alcohol? -Fractionate bilirubin  Hypomagnesemia: Resolved.  Alcohol use disorder: reportedly drinks about 3 quarts of beer and unspecified amount of liquor daily.  No withdrawal symptoms yet.  H&H stable 14 on admission.  Confident about quitting.  He states he has been sober for 5 years previously. -Continue CIWA with as needed Ativan -Add clonidine given elevated BP and tachycardia -Continue multivitamins, folic acid and thiamine -Consult TOC for resources  Fall due to alcohol intoxication Right forehead/periorbital hematoma-no visual impairment or eye pain.  EOMI. Medially displaced left nasal bone fracture-no septal deviation or airway compromise. -Apply ice intermittently and keep head elevated -Outpatient follow-up  with ENT for nasal bone fracture.  Tobacco use disorder: Smokes about a pack a day -Encourage cessation. 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  Bilateral neuropathic pain: Likely due to alcohol. -Check B12 -P.o. gabapentin-could help with alcohol  withdrawal  Tachycardia/elevated BP -Add clonidine 0.1 mg twice daily -As needed hydralazine   Body mass index is 22.49 kg/m.         DVT prophylaxis:  SCDs Start: 06/29/20 2211  Code Status: Full code Family Communication: Patient and/or RN. Available if any question.  Status is: Inpatient  Remains inpatient appropriate because:Persistent severe electrolyte disturbances, Unsafe d/c plan and Inpatient level of care appropriate due to severity of illness   Dispo: The patient is from: Home              Anticipated d/c is to: Home              Anticipated d/c date is: 2 days              Patient currently is not medically stable to d/c.       Consultants:  None   Sch Meds:  Scheduled Meds: . Chlorhexidine Gluconate Cloth  6 each Topical Daily  . cloNIDine  0.1 mg Oral BID  . feeding supplement  237 mL Oral BID BM  . folic acid  1 mg Oral Daily  . gabapentin  300 mg Oral TID  . mouth rinse  15 mL Mouth Rinse BID  . multivitamin with minerals  1 tablet Oral Daily  . nicotine  21 mg Transdermal Daily  . thiamine  100 mg Oral Daily   Or  . thiamine  100 mg Intravenous Daily   Continuous Infusions: . sodium chloride Stopped (06/30/20 0737)   PRN Meds:.acetaminophen **OR** acetaminophen, hydrALAZINE, LORazepam **OR** LORazepam, ondansetron **OR** ondansetron (ZOFRAN) IV  Antimicrobials: Anti-infectives (From admission, onward)   None       I have personally reviewed the following labs and images: CBC: Recent Labs  Lab 06/29/20 1903  WBC 10.3  NEUTROABS 7.9*  HGB 13.2  HCT 35.5*  MCV 92.4  PLT 240   BMP &GFR Recent Labs  Lab 06/29/20 1903 06/29/20 1933 06/30/20 0136 06/30/20 0311  NA 111*  --  116* 120*  120*  K 4.6  --   --  4.7  CL 79*  --   --  87*  CO2 18*  --   --  21*  GLUCOSE 74  --   --  76  BUN 8  --   --  8  CREATININE 0.46*  --   --  0.58*  CALCIUM 8.5*  --   --  8.5*  MG  --  1.6*  --  2.3  PHOS  --   --   --  3.4    Estimated Creatinine Clearance: 81.9 mL/min (A) (by C-G formula based on SCr of 0.58 mg/dL (L)). Liver & Pancreas: Recent Labs  Lab 06/29/20 1903 06/30/20 0311  AST 64* 59*  ALT 24 24  ALKPHOS 81 78  BILITOT 2.9* 3.9*  PROT 7.1 6.8  ALBUMIN 3.8 3.6   No results for input(s): LIPASE, AMYLASE in the last 168 hours. No results for input(s): AMMONIA in the last 168 hours. Diabetic: No results for input(s): HGBA1C in the last 72 hours. No results for input(s): GLUCAP in the last 168 hours. Cardiac Enzymes: No results for input(s): CKTOTAL, CKMB,  CKMBINDEX, TROPONINI in the last 168 hours. No results for input(s): PROBNP in the last 8760 hours. Coagulation Profile: No results for input(s): INR, PROTIME in the last 168 hours. Thyroid Function Tests: No results for input(s): TSH, T4TOTAL, FREET4, T3FREE, THYROIDAB in the last 72 hours. Lipid Profile: No results for input(s): CHOL, HDL, LDLCALC, TRIG, CHOLHDL, LDLDIRECT in the last 72 hours. Anemia Panel: No results for input(s): VITAMINB12, FOLATE, FERRITIN, TIBC, IRON, RETICCTPCT in the last 72 hours. Urine analysis:    Component Value Date/Time   COLORURINE AMBER (A) 06/29/2020 1933   APPEARANCEUR CLEAR 06/29/2020 1933   LABSPEC 1.020 06/29/2020 1933   PHURINE 6.0 06/29/2020 1933   GLUCOSEU NEGATIVE 06/29/2020 1933   HGBUR NEGATIVE 06/29/2020 1933   BILIRUBINUR NEGATIVE 06/29/2020 1933   KETONESUR 20 (A) 06/29/2020 1933   PROTEINUR 100 (A) 06/29/2020 1933   NITRITE NEGATIVE 06/29/2020 1933   LEUKOCYTESUR NEGATIVE 06/29/2020 1933   Sepsis Labs: Invalid input(s): PROCALCITONIN, LACTICIDVEN  Microbiology: Recent Results (from the past 240 hour(s))  Resp Panel by RT-PCR (Flu A&B, Covid) Nasopharyngeal Swab     Status: None   Collection Time: 06/29/20  8:32 PM   Specimen: Nasopharyngeal Swab; Nasopharyngeal(NP) swabs in vial transport medium  Result Value Ref Range Status   SARS Coronavirus 2 by RT PCR NEGATIVE NEGATIVE  Final    Comment: (NOTE) SARS-CoV-2 target nucleic acids are NOT DETECTED.  The SARS-CoV-2 RNA is generally detectable in upper respiratory specimens during the acute phase of infection. The lowest concentration of SARS-CoV-2 viral copies this assay can detect is 138 copies/mL. A negative result does not preclude SARS-Cov-2 infection and should not be used as the sole basis for treatment or other patient management decisions. A negative result may occur with  improper specimen collection/handling, submission of specimen other than nasopharyngeal swab, presence of viral mutation(s) within the areas targeted by this assay, and inadequate number of viral copies(<138 copies/mL). A negative result must be combined with clinical observations, patient history, and epidemiological information. The expected result is Negative.  Fact Sheet for Patients:  BloggerCourse.comhttps://www.fda.gov/media/152166/download  Fact Sheet for Healthcare Providers:  SeriousBroker.ithttps://www.fda.gov/media/152162/download  This test is no t yet approved or cleared by the [place] FDA and  has been authorized for detection and/or diagnosis of SARS-CoV-2 by FDA under an Emergency Use Authorization (EUA). This EUA will remain  in effect (meaning this test can be used) for the duration of the COVID-19 declaration under Section 564(b)(1) of the Act, 21 U.S.C.section 360bbb-3(b)(1), unless the authorization is terminated  or revoked sooner.       Influenza A by PCR NEGATIVE NEGATIVE Final   Influenza B by PCR NEGATIVE NEGATIVE Final    Comment: (NOTE) The Xpert Xpress SARS-CoV-2/FLU/RSV plus assay is intended as an aid in the diagnosis of influenza from Nasopharyngeal swab specimens and should not be used as a sole basis for treatment. Nasal washings and aspirates are unacceptable for Xpert Xpress SARS-CoV-2/FLU/RSV testing.  Fact Sheet for Patients: BloggerCourse.comhttps://www.fda.gov/media/152166/download  Fact Sheet for Healthcare  Providers: SeriousBroker.ithttps://www.fda.gov/media/152162/download  This test is not yet approved or cleared by the [place] FDA and has been authorized for detection and/or diagnosis of SARS-CoV-2 by FDA under an Emergency Use Authorization (EUA). This EUA will remain in effect (meaning this test can be used) for the duration of the COVID-19 declaration under Section 564(b)(1) of the Act, 21 U.S.C. section 360bbb-3(b)(1), unless the authorization is terminated or revoked.  Performed [facility], [address] [place], [place]  [address]   MRSA PCR Screening     Status: None   Collection Time: 06/29/20 11:40 PM   Specimen: Nasopharyngeal  Result Value Ref Range Status   MRSA by PCR NEGATIVE NEGATIVE Final    Comment:        The GeneXpert MRSA Assay (FDA approved for NASAL specimens only), is one component of a comprehensive MRSA colonization surveillance program. It is not intended to diagnose MRSA infection nor to guide or monitor treatment for MRSA infections. Performed at [facility], [address], [place], [place] [address]     Radiology Studies: CT Head Wo Contrast  Result Date: 06/29/2020 CLINICAL DATA:  Facial trauma. Additional history provided: Status post mechanical fall off curb, bilateral hip pain, head trauma. EXAM: CT HEAD WITHOUT CONTRAST CT MAXILLOFACIAL WITHOUT CONTRAST CT CERVICAL SPINE WITHOUT CONTRAST TECHNIQUE: Multidetector CT imaging of the head, cervical spine, and maxillofacial structures were performed using the standard protocol without intravenous contrast. Multiplanar CT image reconstructions of the cervical spine and maxillofacial structures were also generated. COMPARISON:  CT head/cervical spine 03/25/2014. FINDINGS: CT HEAD FINDINGS Brain: Mild generalized cerebral atrophy. Redemonstrated CSF density cystic lesion in the suprasellar region. This is unchanged in size as compared to the head CT of 03/25/2014,  again measuring up to 3.6 cm in greatest single dimension. There is no acute intracranial hemorrhage. No demarcated cortical infarct. No midline shift. Vascular: No hyperdense vessel.  Atherosclerotic calcifications Skull: Normal. Negative for fracture or focal lesion. Other: Large right forehead/right periorbital hematoma. CT MAXILLOFACIAL FINDINGS Osseous: Medially displaced fracture of the left nasal bone, new as compared to the prior head CT of 03/25/2014, but otherwise age indeterminate. No other maxillofacial fracture is identified. Orbits: Prominent right periorbital hematoma. Otherwise, no acute finding. The globes are normal in size and contour. The extraocular muscles and optic nerve sheath complexes are symmetric and unremarkable. Sinuses: Trace scattered paranasal sinus mucosal thickening. No air-fluid levels. Soft tissues: Prominent right forehead/periorbital hematoma. Other: S-shaped deviation of the bony nasal septum. Small nasal septal defect (series 9, image 22). CT CERVICAL SPINE FINDINGS Alignment: Straightening of the expected cervical lordosis. Trace C4-C5 grade 1 anterolisthesis. Skull base and vertebrae: The basion-dental and atlanto-dental intervals are maintained.No evidence of acute fracture to the cervical spine. Prominent multilevel ventral osteophytes. Soft tissues and spinal canal: No prevertebral fluid or swelling. No visible canal hematoma. Disc levels: Cervical spondylosis with multilevel disc space narrowing, disc bulges, posterior disc osteophytes, uncovertebral hypertrophy and facet arthrosis. There is moderate (and possibly severe) no canal stenosis at C5-C6 and C6-C7. Multilevel bony neural foraminal narrowing. Upper chest: Paraseptal emphysema.  No visible pneumothorax. IMPRESSION: CT head: 1. No acute posttraumatic intracranial findings. 2. Prominent right forehead/periorbital hematoma. 3. Cystic lesion within the suprasellar region measuring 3.6 cm in greatest single  dimension, unchanged in size as compared to the head CT of 03/25/2014. Differential considerations include epidermoid, arachnoid cyst or subarachnoid adhesion. Nonemergent contrast-enhanced MRI is recommended if not already performed. 4. Mild cerebral atrophy. CT maxillofacial: 1. Medially displaced fracture of the left nasal bone, new as compared to the head CT of 03/25/2014 but otherwise age indeterminate. 2. No other maxillofacial fracture is identified. 3. Prominent right forehead/periorbital hematoma. 4. S-shaped deviation of the bony nasal septum. Small nasal septal defect. CT cervical spine: 1. No evidence of acute fracture to the cervical spine. 2. Mild chronic C4-C5 grade 1 anterolisthesis. 3. Cervical spondylosis as described. Suspect moderate (and possibly severe) spinal canal stenosis at C5-C6 and C6-C7  4.  Emphysema (ICD10-J43.9). Electronically Signed   By: Jackey Loge DO   On: 06/29/2020 20:48   CT Cervical Spine Wo Contrast  Result Date: 06/29/2020 CLINICAL DATA:  Facial trauma. Additional history provided: Status post mechanical fall off curb, bilateral hip pain, head trauma. EXAM: CT HEAD WITHOUT CONTRAST CT MAXILLOFACIAL WITHOUT CONTRAST CT CERVICAL SPINE WITHOUT CONTRAST TECHNIQUE: Multidetector CT imaging of the head, cervical spine, and maxillofacial structures were performed using the standard protocol without intravenous contrast. Multiplanar CT image reconstructions of the cervical spine and maxillofacial structures were also generated. COMPARISON:  CT head/cervical spine 03/25/2014. FINDINGS: CT HEAD FINDINGS Brain: Mild generalized cerebral atrophy. Redemonstrated CSF density cystic lesion in the suprasellar region. This is unchanged in size as compared to the head CT of 03/25/2014, again measuring up to 3.6 cm in greatest single dimension. There is no acute intracranial hemorrhage. No demarcated cortical infarct. No midline shift. Vascular: No hyperdense vessel.  Atherosclerotic  calcifications Skull: Normal. Negative for fracture or focal lesion. Other: Large right forehead/right periorbital hematoma. CT MAXILLOFACIAL FINDINGS Osseous: Medially displaced fracture of the left nasal bone, new as compared to the prior head CT of 03/25/2014, but otherwise age indeterminate. No other maxillofacial fracture is identified. Orbits: Prominent right periorbital hematoma. Otherwise, no acute finding. The globes are normal in size and contour. The extraocular muscles and optic nerve sheath complexes are symmetric and unremarkable. Sinuses: Trace scattered paranasal sinus mucosal thickening. No air-fluid levels. Soft tissues: Prominent right forehead/periorbital hematoma. Other: S-shaped deviation of the bony nasal septum. Small nasal septal defect (series 9, image 22). CT CERVICAL SPINE FINDINGS Alignment: Straightening of the expected cervical lordosis. Trace C4-C5 grade 1 anterolisthesis. Skull base and vertebrae: The basion-dental and atlanto-dental intervals are maintained.No evidence of acute fracture to the cervical spine. Prominent multilevel ventral osteophytes. Soft tissues and spinal canal: No prevertebral fluid or swelling. No visible canal hematoma. Disc levels: Cervical spondylosis with multilevel disc space narrowing, disc bulges, posterior disc osteophytes, uncovertebral hypertrophy and facet arthrosis. There is moderate (and possibly severe) no canal stenosis at C5-C6 and C6-C7. Multilevel bony neural foraminal narrowing. Upper chest: Paraseptal emphysema.  No visible pneumothorax. IMPRESSION: CT head: 1. No acute posttraumatic intracranial findings. 2. Prominent right forehead/periorbital hematoma. 3. Cystic lesion within the suprasellar region measuring 3.6 cm in greatest single dimension, unchanged in size as compared to the head CT of 03/25/2014. Differential considerations include epidermoid, arachnoid cyst or subarachnoid adhesion. Nonemergent contrast-enhanced MRI is recommended  if not already performed. 4. Mild cerebral atrophy. CT maxillofacial: 1. Medially displaced fracture of the left nasal bone, new as compared to the head CT of 03/25/2014 but otherwise age indeterminate. 2. No other maxillofacial fracture is identified. 3. Prominent right forehead/periorbital hematoma. 4. S-shaped deviation of the bony nasal septum. Small nasal septal defect. CT cervical spine: 1. No evidence of acute fracture to the cervical spine. 2. Mild chronic C4-C5 grade 1 anterolisthesis. 3. Cervical spondylosis as described. Suspect moderate (and possibly severe) spinal canal stenosis at C5-C6 and C6-C7 4.  Emphysema (ICD10-J43.9). Electronically Signed   By: Jackey Loge DO   On: 06/29/2020 20:48   DG Hip Unilat With Pelvis 2-3 Views Left  Result Date: 06/29/2020 CLINICAL DATA:  Hip pain history of fall EXAM: DG HIP (WITH OR WITHOUT PELVIS) 2-3V LEFT COMPARISON:  None. FINDINGS: SI joints are non widened. The pubic symphysis and rami appear intact. No acute displaced fracture or malalignment. 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 Vascular calcifications. IMPRESSION: No acute  osseous abnormality. Electronically Signed   By: Jasmine Pang M.D.   On: 06/29/2020 18:34   DG Hip Unilat  With Pelvis 2-3 Views Right  Result Date: 06/29/2020 CLINICAL DATA:  Hip pain after fall EXAM: DG HIP (WITH OR WITHOUT PELVIS) 2-3V RIGHT COMPARISON:  None. FINDINGS: No fracture or malalignment. Moderate arthritis of the right hip. Right pubic rami appear intact. IMPRESSION: No acute osseous abnormality. Electronically Signed   By: Jasmine Pang M.D.   On: 06/29/2020 18:32   CT Maxillofacial Wo Contrast  Result Date: 06/29/2020 CLINICAL DATA:  Facial trauma. Additional history provided: Status post mechanical fall off curb, bilateral hip pain, head trauma. EXAM: CT HEAD WITHOUT CONTRAST CT MAXILLOFACIAL WITHOUT CONTRAST CT CERVICAL SPINE WITHOUT CONTRAST TECHNIQUE: Multidetector CT imaging of the head,  cervical spine, and maxillofacial structures were performed using the standard protocol without intravenous contrast. Multiplanar CT image reconstructions of the cervical spine and maxillofacial structures were also generated. COMPARISON:  CT head/cervical spine 03/25/2014. FINDINGS: CT HEAD FINDINGS Brain: Mild generalized cerebral atrophy. Redemonstrated CSF density cystic lesion in the suprasellar region. This is unchanged in size as compared to the head CT of 03/25/2014, again measuring up to 3.6 cm in greatest single dimension. There is no acute intracranial hemorrhage. No demarcated cortical infarct. No midline shift. Vascular: No hyperdense vessel.  Atherosclerotic calcifications Skull: Normal. Negative for fracture or focal lesion. Other: Large right forehead/right periorbital hematoma. CT MAXILLOFACIAL FINDINGS Osseous: Medially displaced fracture of the left nasal bone, new as compared to the prior head CT of 03/25/2014, but otherwise age indeterminate. No other maxillofacial fracture is identified. Orbits: Prominent right periorbital hematoma. Otherwise, no acute finding. The globes are normal in size and contour. The extraocular muscles and optic nerve sheath complexes are symmetric and unremarkable. Sinuses: Trace scattered paranasal sinus mucosal thickening. No air-fluid levels. Soft tissues: Prominent right forehead/periorbital hematoma. Other: S-shaped deviation of the bony nasal septum. Small nasal septal defect (series 9, image 22). CT CERVICAL SPINE FINDINGS Alignment: Straightening of the expected cervical lordosis. Trace C4-C5 grade 1 anterolisthesis. Skull base and vertebrae: The basion-dental and atlanto-dental intervals are maintained.No evidence of acute fracture to the cervical spine. Prominent multilevel ventral osteophytes. Soft tissues and spinal canal: No prevertebral fluid or swelling. No visible canal hematoma. Disc levels: Cervical spondylosis with multilevel disc space narrowing,  disc bulges, posterior disc osteophytes, uncovertebral hypertrophy and facet arthrosis. There is moderate (and possibly severe) no canal stenosis at C5-C6 and C6-C7. Multilevel bony neural foraminal narrowing. Upper chest: Paraseptal emphysema.  No visible pneumothorax. IMPRESSION: CT head: 1. No acute posttraumatic intracranial findings. 2. Prominent right forehead/periorbital hematoma. 3. Cystic lesion within the suprasellar region measuring 3.6 cm in greatest single dimension, unchanged in size as compared to the head CT of 03/25/2014. Differential considerations include epidermoid, arachnoid cyst or subarachnoid adhesion. Nonemergent contrast-enhanced MRI is recommended if not already performed. 4. Mild cerebral atrophy. CT maxillofacial: 1. Medially displaced fracture of the left nasal bone, new as compared to the head CT of 03/25/2014 but otherwise age indeterminate. 2. No other maxillofacial fracture is identified. 3. Prominent right forehead/periorbital hematoma. 4. S-shaped deviation of the bony nasal septum. Small nasal septal defect. CT cervical spine: 1. No evidence of acute fracture to the cervical spine. 2. Mild chronic C4-C5 grade 1 anterolisthesis. 3. Cervical spondylosis as described. Suspect moderate (and possibly severe) spinal canal stenosis at C5-C6 and C6-C7 4.  Emphysema (ICD10-J43.9). Electronically Signed   By: Jackey Loge DO   On: 06/29/2020  20:48      Cody Glenn [facility]  If 7PM-7AM, please contact night-coverage [email or web address] 06/30/2020, 10:06 AM

## 2020-07-01 DIAGNOSIS — E876 Hypokalemia: Secondary | ICD-10-CM

## 2020-07-01 HISTORY — DX: Other disorders of bilirubin metabolism: E80.6

## 2020-07-01 LAB — CBC
HCT: 33.3 % — ABNORMAL LOW (ref 39.0–52.0)
Hemoglobin: 12.1 g/dL — ABNORMAL LOW (ref 13.0–17.0)
MCH: 35 pg — ABNORMAL HIGH (ref 26.0–34.0)
MCHC: 36.3 g/dL — ABNORMAL HIGH (ref 30.0–36.0)
MCV: 96.2 fL (ref 80.0–100.0)
Platelets: 208 10*3/uL (ref 150–400)
RBC: 3.46 MIL/uL — ABNORMAL LOW (ref 4.22–5.81)
RDW: 12.3 % (ref 11.5–15.5)
WBC: 8 10*3/uL (ref 4.0–10.5)
nRBC: 0 % (ref 0.0–0.2)

## 2020-07-01 LAB — COMPREHENSIVE METABOLIC PANEL
ALT: 19 U/L (ref 0–44)
AST: 43 U/L — ABNORMAL HIGH (ref 15–41)
Albumin: 3.2 g/dL — ABNORMAL LOW (ref 3.5–5.0)
Alkaline Phosphatase: 72 U/L (ref 38–126)
Anion gap: 9 (ref 5–15)
BUN: 12 mg/dL (ref 8–23)
CO2: 21 mmol/L — ABNORMAL LOW (ref 22–32)
Calcium: 8.4 mg/dL — ABNORMAL LOW (ref 8.9–10.3)
Chloride: 93 mmol/L — ABNORMAL LOW (ref 98–111)
Creatinine, Ser: 0.54 mg/dL — ABNORMAL LOW (ref 0.61–1.24)
GFR, Estimated: >60 mL/min (ref >60)
Glucose, Bld: 112 mg/dL — ABNORMAL HIGH (ref 70–99)
Potassium: 3.4 mmol/L — ABNORMAL LOW (ref 3.5–5.1)
Sodium: 123 mmol/L — ABNORMAL LOW (ref 135–145)
Total Bilirubin: 1.8 mg/dL — ABNORMAL HIGH (ref 0.3–1.2)
Total Protein: 5.9 g/dL — ABNORMAL LOW (ref 6.5–8.1)

## 2020-07-01 LAB — PHOSPHORUS: Phosphorus: 3.3 mg/dL (ref 2.5–4.6)

## 2020-07-01 LAB — MAGNESIUM: Magnesium: 1.8 mg/dL (ref 1.7–2.4)

## 2020-07-01 LAB — SODIUM, URINE, RANDOM: Sodium, Ur: 16 mmol/L

## 2020-07-01 LAB — URIC ACID, RANDOM URINE: Uric Acid, Urine: 58.1 mg/dL

## 2020-07-01 LAB — SODIUM
Sodium: 123 mmol/L — ABNORMAL LOW (ref 135–145)
Sodium: 125 mmol/L — ABNORMAL LOW (ref 135–145)
Sodium: 129 mmol/L — ABNORMAL LOW (ref 135–145)

## 2020-07-01 LAB — CK: Total CK: 240 U/L (ref 49–397)

## 2020-07-01 LAB — OSMOLALITY, URINE: Osmolality, Ur: 261 mOsm/kg — ABNORMAL LOW (ref 300–900)

## 2020-07-01 LAB — LACTATE DEHYDROGENASE: LDH: 153 U/L (ref 98–192)

## 2020-07-01 NOTE — Plan of Care (Signed)
This RN will continue to carefully monitor patient's progression of care plan.  

## 2020-07-01 NOTE — Progress Notes (Signed)
PROGRESS NOTE    Cody Glenn  ZOX:[id number] DOB: 31-Oct-1955 DOA: 06/29/2020 PCP: Patient, No Pcp Per   Brief Narrative: Cody Glenn is a 64 y.o. male with history of alcohol and tobacco use disorder. Patient presented secondary to fall and head injury, found to have a nasal fracture in addition to significant hyponatremia. Started on IV fluids.   Assessment & Plan:   Principal Problem:   Hyponatremia Active Problems:   Alcohol use   Tobacco use   Malnutrition of moderate degree   Hyperbilirubinemia   Hypokalemia   Hyponatremia Likely chronic. Patient with a fall prior to admission. Sodium of 111 on admission. Initially given a NS bolus given on admission followed by NS at 50 ml/hr with rapid improvement of sodium to 120 within 6 hours. IV fluids discontinued. Sodium continuing to increase with increased dietary intake. -BMP in AM  Nasal bone facture Left. Displaced medially. Recommendation for outpatient ENT follow-up  Fall Initially thought secondary to alcohol intoxication but possibly related to hyponatremia as well.  Alcohol abuse Low CIWA scores. -Continue CIWA  Tobacco use Cessation discussed -Continue nicotine patch  Bilateral neuropathic pain Patient states that this is somewhat new. CT cervical spine significant for moderate-severe C5-C6/C6-C7 stenosis. No focal weakness noted on exam -Outpatient neurosurgery follow-up  Suprasellar cystic lesion Recommendations for non-emergent MRI brain -MRI brain  Elevated BP Started on clonidine -Continue clonidine  Hyperbilirubinemia Likely related alcohol use. AST of 43 in setting of alcohol use. -RUQ ultrasound  Hypokalemia -Supplementation   DVT prophylaxis: SCDs Code Status:   Code Status: Full Code Family Communication: None at bedside Disposition Plan: Discharge home vs SNF pending PT evaluation in addition to continued improvement of sodium and no development vs treatment of withdrawal  symptoms   Consultants:   None  Procedures:   None  Antimicrobials:  None    Subjective: Some neck pain and lower back numbness.  Objective: Vitals:   07/01/20 0600 07/01/20 0700 07/01/20 0800 07/01/20 0900  BP: 140/85 91/60 (!) 124/96 135/83  Pulse: 64 66 68 71  Resp:   13 17  Temp:   98.1 F (36.7 C)   TempSrc:   Oral   SpO2: 100% 99% 100% 100%  Weight:      Height:        Intake/Output Summary (Last 24 hours) at 07/01/2020 0948 Last data filed at 07/01/2020 0924 Gross per 24 hour  Intake 960 ml  Output --  Net 960 ml   Filed Weights   06/29/20 2300  Weight: 61.3 kg    Examination:  General exam: Appears calm and comfortable  Respiratory system: Clear to auscultation. Respiratory effort normal. Cardiovascular system: S1 & S2 heard, RRR. No murmurs, rubs, gallops or clicks. Gastrointestinal system: Abdomen is nondistended, soft and nontender. No organomegaly or masses felt. Normal bowel sounds heard. Central nervous system: Alert and oriented. No focal neurological deficits. 5/5 BLE strength. 2-3+ patellar reflexes. No clonus. Musculoskeletal: No edema. No calf tenderness Skin: No cyanosis. No rashes Psychiatry: Judgement and insight appear normal. Mood & affect appropriate.     Data Reviewed: I have personally reviewed following labs and imaging studies  CBC Lab Results  Component Value Date   WBC 8.0 07/01/2020   RBC 3.46 (L) 07/01/2020   HGB 12.1 (L) 07/01/2020   HCT 33.3 (L) 07/01/2020   MCV 96.2 07/01/2020   MCH 35.0 (H) 07/01/2020   PLT 208 07/01/2020   MCHC 36.3 (H) 07/01/2020   RDW 12.3  07/01/2020   LYMPHSABS 1.3 06/29/2020   MONOABS 1.0 06/29/2020   EOSABS 0.0 06/29/2020   BASOSABS 0.0 06/29/2020     Last metabolic panel Lab Results  Component Value Date   NA 125 (L) 07/01/2020   K 3.4 (L) 07/01/2020   CL 93 (L) 07/01/2020   CO2 21 (L) 07/01/2020   BUN 12 07/01/2020   CREATININE 0.54 (L) 07/01/2020   GLUCOSE 112 (H)  07/01/2020   GFRNONAA >60 07/01/2020   CALCIUM 8.4 (L) 07/01/2020   PHOS 3.3 07/01/2020   PROT 5.9 (L) 07/01/2020   ALBUMIN 3.2 (L) 07/01/2020   BILITOT 1.8 (H) 07/01/2020   ALKPHOS 72 07/01/2020   AST 43 (H) 07/01/2020   ALT 19 07/01/2020   ANIONGAP 9 07/01/2020    CBG (last 3)  No results for input(s): GLUCAP in the last 72 hours.   GFR: Estimated Creatinine Clearance: 81.9 mL/min (A) (by C-G formula based on SCr of 0.54 mg/dL (L)).  Coagulation Profile: No results for input(s): INR, PROTIME in the last 168 hours.  Recent Results (from the past 240 hour(s))  Resp Panel by RT-PCR (Flu A&B, Covid) Nasopharyngeal Swab     Status: None   Collection Time: 06/29/20  8:32 PM   Specimen: Nasopharyngeal Swab; Nasopharyngeal(NP) swabs in vial transport medium  Result Value Ref Range Status   SARS Coronavirus 2 by RT PCR NEGATIVE NEGATIVE Final    Comment: (NOTE) SARS-CoV-2 target nucleic acids are NOT DETECTED.  The SARS-CoV-2 RNA is generally detectable in upper respiratory specimens during the acute phase of infection. The lowest concentration of SARS-CoV-2 viral copies this assay can detect is 138 copies/mL. A negative result does not preclude SARS-Cov-2 infection and should not be used as the sole basis for treatment or other patient management decisions. A negative result may occur with  improper specimen collection/handling, submission of specimen other than nasopharyngeal swab, presence of viral mutation(s) within the areas targeted by this assay, and inadequate number of viral copies(<138 copies/mL). A negative result must be combined with clinical observations, patient history, and epidemiological information. The expected result is Negative.  Fact Sheet for Patients:  BloggerCourse.com  Fact Sheet for Healthcare Providers:  SeriousBroker.it  This test is no t yet approved or cleared by the [place] FDA and  has  been authorized for detection and/or diagnosis of SARS-CoV-2 by FDA under an Emergency Use Authorization (EUA). This EUA will remain  in effect (meaning this test can be used) for the duration of the COVID-19 declaration under Section 564(b)(1) of the Act, 21 U.S.C.section 360bbb-3(b)(1), unless the authorization is terminated  or revoked sooner.       Influenza A by PCR NEGATIVE NEGATIVE Final   Influenza B by PCR NEGATIVE NEGATIVE Final    Comment: (NOTE) The Xpert Xpress SARS-CoV-2/FLU/RSV plus assay is intended as an aid in the diagnosis of influenza from Nasopharyngeal swab specimens and should not be used as a sole basis for treatment. Nasal washings and aspirates are unacceptable for Xpert Xpress SARS-CoV-2/FLU/RSV testing.  Fact Sheet for Patients: BloggerCourse.com  Fact Sheet for Healthcare Providers: SeriousBroker.it  This test is not yet approved or cleared by the [place] FDA and has been authorized for detection and/or diagnosis of SARS-CoV-2 by FDA under an Emergency Use Authorization (EUA). This EUA will remain in effect (meaning this test can be used) for the duration of the COVID-19 declaration under Section 564(b)(1) of the Act, 21 U.S.C. section 360bbb-3(b)(1), unless the authorization is terminated or revoked.  Performed at [facility], [address], [place], [place] [address]   MRSA PCR Screening     Status: None   Collection Time: 06/29/20 11:40 PM   Specimen: Nasopharyngeal  Result Value Ref Range Status   MRSA by PCR NEGATIVE NEGATIVE Final    Comment:        The GeneXpert MRSA Assay (FDA approved for NASAL specimens only), is one component of a comprehensive MRSA colonization surveillance program. It is not intended to diagnose MRSA infection nor to guide or monitor treatment for MRSA infections. Performed at [facility], [address]  [place], [place] [address]         Radiology Studies: CT Head Wo Contrast  Result Date: 06/29/2020 CLINICAL DATA:  Facial trauma. Additional history provided: Status post mechanical fall off curb, bilateral hip pain, head trauma. EXAM: CT HEAD WITHOUT CONTRAST CT MAXILLOFACIAL WITHOUT CONTRAST CT CERVICAL SPINE WITHOUT CONTRAST TECHNIQUE: Multidetector CT imaging of the head, cervical spine, and maxillofacial structures were performed using the standard protocol without intravenous contrast. Multiplanar CT image reconstructions of the cervical spine and maxillofacial structures were also generated. COMPARISON:  CT head/cervical spine 03/25/2014. FINDINGS: CT HEAD FINDINGS Brain: Mild generalized cerebral atrophy. Redemonstrated CSF density cystic lesion in the suprasellar region. This is unchanged in size as compared to the head CT of 03/25/2014, again measuring up to 3.6 cm in greatest single dimension. There is no acute intracranial hemorrhage. No demarcated cortical infarct. No midline shift. Vascular: No hyperdense vessel.  Atherosclerotic calcifications Skull: Normal. Negative for fracture or focal lesion. Other: Large right forehead/right periorbital hematoma. CT MAXILLOFACIAL FINDINGS Osseous: Medially displaced fracture of the left nasal bone, new as compared to the prior head CT of 03/25/2014, but otherwise age indeterminate. No other maxillofacial fracture is identified. Orbits: Prominent right periorbital hematoma. Otherwise, no acute finding. The globes are normal in size and contour. The extraocular muscles and optic nerve sheath complexes are symmetric and unremarkable. Sinuses: Trace scattered paranasal sinus mucosal thickening. No air-fluid levels. Soft tissues: Prominent right forehead/periorbital hematoma. Other: S-shaped deviation of the bony nasal septum. Small nasal septal defect (series 9, image 22). CT CERVICAL SPINE FINDINGS Alignment: Straightening of the expected cervical lordosis.  Trace C4-C5 grade 1 anterolisthesis. Skull base and vertebrae: The basion-dental and atlanto-dental intervals are maintained.No evidence of acute fracture to the cervical spine. Prominent multilevel ventral osteophytes. Soft tissues and spinal canal: No prevertebral fluid or swelling. No visible canal hematoma. Disc levels: Cervical spondylosis with multilevel disc space narrowing, disc bulges, posterior disc osteophytes, uncovertebral hypertrophy and facet arthrosis. There is moderate (and possibly severe) no canal stenosis at C5-C6 and C6-C7. Multilevel bony neural foraminal narrowing. Upper chest: Paraseptal emphysema.  No visible pneumothorax. IMPRESSION: CT head: 1. No acute posttraumatic intracranial findings. 2. Prominent right forehead/periorbital hematoma. 3. Cystic lesion within the suprasellar region measuring 3.6 cm in greatest single dimension, unchanged in size as compared to the head CT of 03/25/2014. Differential considerations include epidermoid, arachnoid cyst or subarachnoid adhesion. Nonemergent contrast-enhanced MRI is recommended if not already performed. 4. Mild cerebral atrophy. CT maxillofacial: 1. Medially displaced fracture of the left nasal bone, new as compared to the head CT of 03/25/2014 but otherwise age indeterminate. 2. No other maxillofacial fracture is identified. 3. Prominent right forehead/periorbital hematoma. 4. S-shaped deviation of the bony nasal septum. Small nasal septal defect. CT cervical spine: 1. No evidence of acute fracture to the cervical spine. 2. Mild chronic C4-C5 grade 1 anterolisthesis. 3.  Cervical spondylosis as described. Suspect moderate (and possibly severe) spinal canal stenosis at C5-C6 and C6-C7 4.  Emphysema (ICD10-J43.9). Electronically Signed   By: Jackey Loge DO   On: 06/29/2020 20:48   CT Cervical Spine Wo Contrast  Result Date: 06/29/2020 CLINICAL DATA:  Facial trauma. Additional history provided: Status post mechanical fall off curb,  bilateral hip pain, head trauma. EXAM: CT HEAD WITHOUT CONTRAST CT MAXILLOFACIAL WITHOUT CONTRAST CT CERVICAL SPINE WITHOUT CONTRAST TECHNIQUE: Multidetector CT imaging of the head, cervical spine, and maxillofacial structures were performed using the standard protocol without intravenous contrast. Multiplanar CT image reconstructions of the cervical spine and maxillofacial structures were also generated. COMPARISON:  CT head/cervical spine 03/25/2014. FINDINGS: CT HEAD FINDINGS Brain: Mild generalized cerebral atrophy. Redemonstrated CSF density cystic lesion in the suprasellar region. This is unchanged in size as compared to the head CT of 03/25/2014, again measuring up to 3.6 cm in greatest single dimension. There is no acute intracranial hemorrhage. No demarcated cortical infarct. No midline shift. Vascular: No hyperdense vessel.  Atherosclerotic calcifications Skull: Normal. Negative for fracture or focal lesion. Other: Large right forehead/right periorbital hematoma. CT MAXILLOFACIAL FINDINGS Osseous: Medially displaced fracture of the left nasal bone, new as compared to the prior head CT of 03/25/2014, but otherwise age indeterminate. No other maxillofacial fracture is identified. Orbits: Prominent right periorbital hematoma. Otherwise, no acute finding. The globes are normal in size and contour. The extraocular muscles and optic nerve sheath complexes are symmetric and unremarkable. Sinuses: Trace scattered paranasal sinus mucosal thickening. No air-fluid levels. Soft tissues: Prominent right forehead/periorbital hematoma. Other: S-shaped deviation of the bony nasal septum. Small nasal septal defect (series 9, image 22). CT CERVICAL SPINE FINDINGS Alignment: Straightening of the expected cervical lordosis. Trace C4-C5 grade 1 anterolisthesis. Skull base and vertebrae: The basion-dental and atlanto-dental intervals are maintained.No evidence of acute fracture to the cervical spine. Prominent multilevel  ventral osteophytes. Soft tissues and spinal canal: No prevertebral fluid or swelling. No visible canal hematoma. Disc levels: Cervical spondylosis with multilevel disc space narrowing, disc bulges, posterior disc osteophytes, uncovertebral hypertrophy and facet arthrosis. There is moderate (and possibly severe) no canal stenosis at C5-C6 and C6-C7. Multilevel bony neural foraminal narrowing. Upper chest: Paraseptal emphysema.  No visible pneumothorax. IMPRESSION: CT head: 1. No acute posttraumatic intracranial findings. 2. Prominent right forehead/periorbital hematoma. 3. Cystic lesion within the suprasellar region measuring 3.6 cm in greatest single dimension, unchanged in size as compared to the head CT of 03/25/2014. Differential considerations include epidermoid, arachnoid cyst or subarachnoid adhesion. Nonemergent contrast-enhanced MRI is recommended if not already performed. 4. Mild cerebral atrophy. CT maxillofacial: 1. Medially displaced fracture of the left nasal bone, new as compared to the head CT of 03/25/2014 but otherwise age indeterminate. 2. No other maxillofacial fracture is identified. 3. Prominent right forehead/periorbital hematoma. 4. S-shaped deviation of the bony nasal septum. Small nasal septal defect. CT cervical spine: 1. No evidence of acute fracture to the cervical spine. 2. Mild chronic C4-C5 grade 1 anterolisthesis. 3. Cervical spondylosis as described. Suspect moderate (and possibly severe) spinal canal stenosis at C5-C6 and C6-C7 4.  Emphysema (ICD10-J43.9). Electronically Signed   By: Jackey Loge DO   On: 06/29/2020 20:48   DG Hip Unilat With Pelvis 2-3 Views Left  Result Date: 06/29/2020 CLINICAL DATA:  Hip pain history of fall EXAM: DG HIP (WITH OR WITHOUT PELVIS) 2-3V LEFT COMPARISON:  None. FINDINGS: SI joints are non widened. The pubic symphysis and rami appear intact. No acute  displaced fracture or malalignment. 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 Vascular  calcifications. IMPRESSION: No acute osseous abnormality. Electronically Signed   By: Jasmine Pang M.D.   On: 06/29/2020 18:34   DG Hip Unilat  With Pelvis 2-3 Views Right  Result Date: 06/29/2020 CLINICAL DATA:  Hip pain after fall EXAM: DG HIP (WITH OR WITHOUT PELVIS) 2-3V RIGHT COMPARISON:  None. FINDINGS: No fracture or malalignment. Moderate arthritis of the right hip. Right pubic rami appear intact. IMPRESSION: No acute osseous abnormality. Electronically Signed   By: Jasmine Pang M.D.   On: 06/29/2020 18:32   CT Maxillofacial Wo Contrast  Result Date: 06/29/2020 CLINICAL DATA:  Facial trauma. Additional history provided: Status post mechanical fall off curb, bilateral hip pain, head trauma. EXAM: CT HEAD WITHOUT CONTRAST CT MAXILLOFACIAL WITHOUT CONTRAST CT CERVICAL SPINE WITHOUT CONTRAST TECHNIQUE: Multidetector CT imaging of the head, cervical spine, and maxillofacial structures were performed using the standard protocol without intravenous contrast. Multiplanar CT image reconstructions of the cervical spine and maxillofacial structures were also generated. COMPARISON:  CT head/cervical spine 03/25/2014. FINDINGS: CT HEAD FINDINGS Brain: Mild generalized cerebral atrophy. Redemonstrated CSF density cystic lesion in the suprasellar region. This is unchanged in size as compared to the head CT of 03/25/2014, again measuring up to 3.6 cm in greatest single dimension. There is no acute intracranial hemorrhage. No demarcated cortical infarct. No midline shift. Vascular: No hyperdense vessel.  Atherosclerotic calcifications Skull: Normal. Negative for fracture or focal lesion. Other: Large right forehead/right periorbital hematoma. CT MAXILLOFACIAL FINDINGS Osseous: Medially displaced fracture of the left nasal bone, new as compared to the prior head CT of 03/25/2014, but otherwise age indeterminate. No other maxillofacial fracture is identified. Orbits: Prominent right periorbital hematoma. Otherwise,  no acute finding. The globes are normal in size and contour. The extraocular muscles and optic nerve sheath complexes are symmetric and unremarkable. Sinuses: Trace scattered paranasal sinus mucosal thickening. No air-fluid levels. Soft tissues: Prominent right forehead/periorbital hematoma. Other: S-shaped deviation of the bony nasal septum. Small nasal septal defect (series 9, image 22). CT CERVICAL SPINE FINDINGS Alignment: Straightening of the expected cervical lordosis. Trace C4-C5 grade 1 anterolisthesis. Skull base and vertebrae: The basion-dental and atlanto-dental intervals are maintained.No evidence of acute fracture to the cervical spine. Prominent multilevel ventral osteophytes. Soft tissues and spinal canal: No prevertebral fluid or swelling. No visible canal hematoma. Disc levels: Cervical spondylosis with multilevel disc space narrowing, disc bulges, posterior disc osteophytes, uncovertebral hypertrophy and facet arthrosis. There is moderate (and possibly severe) no canal stenosis at C5-C6 and C6-C7. Multilevel bony neural foraminal narrowing. Upper chest: Paraseptal emphysema.  No visible pneumothorax. IMPRESSION: CT head: 1. No acute posttraumatic intracranial findings. 2. Prominent right forehead/periorbital hematoma. 3. Cystic lesion within the suprasellar region measuring 3.6 cm in greatest single dimension, unchanged in size as compared to the head CT of 03/25/2014. Differential considerations include epidermoid, arachnoid cyst or subarachnoid adhesion. Nonemergent contrast-enhanced MRI is recommended if not already performed. 4. Mild cerebral atrophy. CT maxillofacial: 1. Medially displaced fracture of the left nasal bone, new as compared to the head CT of 03/25/2014 but otherwise age indeterminate. 2. No other maxillofacial fracture is identified. 3. Prominent right forehead/periorbital hematoma. 4. S-shaped deviation of the bony nasal septum. Small nasal septal defect. CT cervical spine: 1.  No evidence of acute fracture to the cervical spine. 2. Mild chronic C4-C5 grade 1 anterolisthesis. 3. Cervical spondylosis as described. Suspect moderate (and possibly severe) spinal canal stenosis at C5-C6 and C6-C7  4.  Emphysema (ICD10-J43.9). Electronically Signed   By: Jackey LogeKyle  Golden DO   On: 06/29/2020 20:48        Scheduled Meds: . Chlorhexidine Gluconate Cloth  6 each Topical Daily  . cloNIDine  0.1 mg Oral BID  . cyanocobalamin  1,000 mcg Intramuscular Daily  . feeding supplement  237 mL Oral BID BM  . folic acid  1 mg Oral Daily  . gabapentin  300 mg Oral TID  . mouth rinse  15 mL Mouth Rinse BID  . multivitamin with minerals  1 tablet Oral Daily  . nicotine  21 mg Transdermal Daily  . thiamine  100 mg Oral Daily   Or  . thiamine  100 mg Intravenous Daily   Continuous Infusions:   LOS: 2 days     Jacquelin Hawkingalph Temia Debroux, MD [facility] 07/01/2020, 9:48 AM  If 7PM-7AM, please contact night-coverage [email or web address]

## 2020-07-02 ENCOUNTER — Inpatient Hospital Stay (HOSPITAL_COMMUNITY): Payer: Self-pay

## 2020-07-02 LAB — BASIC METABOLIC PANEL
Anion gap: 8 (ref 5–15)
BUN: 10 mg/dL (ref 8–23)
CO2: 22 mmol/L (ref 22–32)
Calcium: 8.6 mg/dL — ABNORMAL LOW (ref 8.9–10.3)
Chloride: 100 mmol/L (ref 98–111)
Creatinine, Ser: 0.73 mg/dL (ref 0.61–1.24)
GFR, Estimated: >60 mL/min (ref >60)
Glucose, Bld: 210 mg/dL — ABNORMAL HIGH (ref 70–99)
Potassium: 3.5 mmol/L (ref 3.5–5.1)
Sodium: 130 mmol/L — ABNORMAL LOW (ref 135–145)

## 2020-07-02 LAB — CBC
HCT: 35.1 % — ABNORMAL LOW (ref 39.0–52.0)
Hemoglobin: 12 g/dL — ABNORMAL LOW (ref 13.0–17.0)
MCH: 35 pg — ABNORMAL HIGH (ref 26.0–34.0)
MCHC: 34.2 g/dL (ref 30.0–36.0)
MCV: 102.3 fL — ABNORMAL HIGH (ref 80.0–100.0)
Platelets: 187 10*3/uL (ref 150–400)
RBC: 3.43 MIL/uL — ABNORMAL LOW (ref 4.22–5.81)
RDW: 12.6 % (ref 11.5–15.5)
WBC: 6.3 10*3/uL (ref 4.0–10.5)
nRBC: 0 % (ref 0.0–0.2)

## 2020-07-02 LAB — HAPTOGLOBIN: Haptoglobin: 117 mg/dL (ref 32–363)

## 2020-07-02 NOTE — Evaluation (Signed)
Occupational Therapy Evaluation Patient Details Name: Cody Glenn MRN: [id number] DOB: 1955/11/19 Today's Date: 07/02/2020    History of Present Illness Patient is a 64 year old male history of alcohol and tobacco use disorder. Patient presented secondary to fall and head injury, found to have a nasal fracture and significant hyponatremia.    Clinical Impression   Patient reports he rents a room, lives alone and is independent at baseline. Reports recent numbness in LEs and more remote intermittent numbness in B hands sometimes dropping objects and having difficulty standing for long periods of time. Had recent fall off curb prior to this hospitalization. Currently patient is min A for functional mobility for safety with x1 R lateral loss of balance with patient reporting his R LE is weaker than L LE. Patient also reports dizziness with standing, BP taken once seated in recliner 118/90. Recommend continued acute OT services in order to maximize patient safety and independence with self care in order to facilitate D/C to venue listed below.    Follow Up Recommendations  SNF    Equipment Recommendations  None recommended by OT       Precautions / Restrictions Precautions Precautions: Fall Restrictions Weight Bearing Restrictions: No      Mobility Bed Mobility Overal bed mobility: Modified Independent                  Transfers Overall transfer level: Needs assistance Equipment used: None Transfers: Sit to/from Stand Sit to Stand: Min guard         General transfer comment: patient stood quickly from EOB, min G for safety.    Balance Overall balance assessment: Mild deficits observed, not formally tested                                         ADL either performed or assessed with clinical judgement   ADL Overall ADL's : Needs assistance/impaired     Grooming: Set up;Sitting   Upper Body Bathing: Set up;Sitting   Lower Body Bathing:  Minimal assistance;Sit to/from stand   Upper Body Dressing : Set up;Sitting   Lower Body Dressing: Minimal assistance;Sit to/from stand   Toilet Transfer: Minimal assistance;Ambulation Toilet Transfer Details (indicate cue type and reason): patient mildly unsteady with x1 R lateral loss of balance, min A for safety Toileting- Clothing Manipulation and Hygiene: Minimal assistance;Sit to/from stand       Functional mobility during ADLs: Minimal assistance General ADL Comments: patient requiring increased assistance for self care due to decreased balance, activity tolerance, safety awareness                  Pertinent Vitals/Pain Pain Assessment: Faces Faces Pain Scale: Hurts little more Pain Location: hands, lower back, down R leg Pain Descriptors / Indicators: Numbness Pain Intervention(s): Monitored during session     Hand Dominance Right   Extremity/Trunk Assessment Upper Extremity Assessment Upper Extremity Assessment: RUE deficits/detail;LUE deficits/detail RUE Deficits / Details: reports B tingling in finger tips neck CT showing moderate to severe spinal canal stenosis at C5-C6 and C6-C7   Lower Extremity Assessment Lower Extremity Assessment: Defer to PT evaluation   Cervical / Trunk Assessment Cervical / Trunk Assessment: Normal   Communication Communication Communication: No difficulties   Cognition Arousal/Alertness: Awake/alert Behavior During Therapy: Impulsive Overall Cognitive Status: Within Functional Limits for tasks assessed  General Comments: stands quickly from EOB before OT ready   General Comments  reporting dizziness with standing and ambulation, seated in recline BP taken 118/90            Home Living Family/patient expects to be discharged to:: Private residence Living Arrangements: Alone   Type of Home: Other(Comment) (rents a room) Home Access: Stairs to enter Entrance Stairs-Number of  Steps: 10 Entrance Stairs-Rails: None       Bathroom Shower/Tub: Walk-in shower;Tub/shower unit   Bathroom Toilet: Standard     Home Equipment: None          Prior Functioning/Environment Level of Independence: Independent        Comments: retired, 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[employer], broadcasting/film/video Problem List: Decreased activity tolerance;Impaired balance (sitting and/or standing);Decreased safety awareness;Pain      OT Treatment/Interventions: Self-care/ADL training;Therapeutic exercise;DME and/or AE instruction;Therapeutic activities;Patient/family education;Balance training    OT Goals(Current goals can be found in the care plan section) Acute Rehab OT Goals Patient Stated Goal: go to rehab OT Goal Formulation: With patient Time For Goal Achievement: 07/16/20 Potential to Achieve Goals: Good  OT Frequency: Min 2X/week    AM-PAC OT "6 Clicks" Daily Activity     Outcome Measure Help from another person eating meals?: None Help from another person taking care of personal grooming?: A Little Help from another person toileting, which includes using toliet, bedpan, or urinal?: A Little Help from another person bathing (including washing, rinsing, drying)?: A Little Help from another person to put on and taking off regular upper body clothing?: A Little Help from another person to put on and taking off regular lower body clothing?: A Little 6 Click Score: 19   End of Session Nurse Communication: Mobility status  Activity Tolerance: Patient tolerated treatment well Patient left: in bed;with call bell/phone within reach  OT Visit Diagnosis: Other abnormalities of gait and mobility (R26.89);History of falling (Z91.81);Pain Pain - part of body: Hand;Leg (back)                Time: 1610-9604 OT Time Calculation (min): 26 min Charges:  OT General Charges $OT Visit: 1 Visit OT Evaluation $OT Eval Low Complexity: 1 Low OT Treatments $Self Care/Home Management : 8-22  mins  Marlyce Huge OT OT pager: [phone]  Carmelia Roller 07/02/2020, 2:29 PM

## 2020-07-02 NOTE — Progress Notes (Signed)
PROGRESS NOTE    Cody Glenn  OFB:[id number] DOB: Apr 03, 1956 DOA: 06/29/2020 PCP: Patient, No Pcp Per   Brief Narrative: Cody Glenn is a 64 y.o. male with history of alcohol and tobacco use disorder. Patient presented secondary to fall and head injury, found to have a nasal fracture in addition to significant hyponatremia. Started on IV fluids.   Assessment & Plan:   Principal Problem:   Hyponatremia Active Problems:   Alcohol use   Tobacco use   Malnutrition of moderate degree   Hyperbilirubinemia   Hypokalemia   Hyponatremia Likely chronic. Patient with a fall prior to admission. Sodium of 111 on admission. Initially given a NS bolus given on admission followed by NS at 50 ml/hr with rapid improvement of sodium to 120 within 6 hours. IV fluids discontinued. Sodium continuing to increase with increased dietary intake and is in an adequate range of 130.  Nasal bone facture Left. Displaced medially. Recommendation for outpatient ENT follow-up  Fall Initially thought secondary to alcohol intoxication but possibly related to hyponatremia as well. -PT/OT eval -TOC consult for likely SNF placement  Alcohol abuse Low CIWA scores. -Continue CIWA  Tobacco use Cessation discussed -Continue nicotine patch  Bilateral neuropathic pain Patient states that this is somewhat new. CT cervical spine significant for moderate-severe C5-C6/C6-C7 stenosis. No focal weakness noted on exam -Outpatient neurosurgery follow-up  Suprasellar cystic lesion Recommendations for non-emergent MRI brain -MRI brain  Elevated BP Started on clonidine -Continue clonidine  Hyperbilirubinemia Likely related alcohol use. AST of 43 in setting of alcohol use. -RUQ ultrasound  Hypokalemia -Supplementation   DVT prophylaxis: SCDs Code Status:   Code Status: Full Code Family Communication: None at bedside Disposition Plan: Transfer to medical floor. Anticipate discharge to SNF.  Medically stable for discharge pending PT/OT recommendations and likely SNF bed availability   Consultants:   None  Procedures:   None  Antimicrobials:  None    Subjective: Hip pain. Some neck pain. No other concerns.  Objective: Vitals:   07/02/20 0400 07/02/20 0600 07/02/20 0700 07/02/20 0807  BP: 119/80 131/75 (!) 144/83   Pulse: 74 (!) 56 (!) 58   Resp: 12  14   Temp:    (!) 97.3 F (36.3 C)  TempSrc:    Oral  SpO2: 100% 100% 100%   Weight:      Height:        Intake/Output Summary (Last 24 hours) at 07/02/2020 0816 Last data filed at 07/02/2020 0800 Gross per 24 hour  Intake 960 ml  Output 750 ml  Net 210 ml   Filed Weights   06/29/20 2300  Weight: 61.3 kg    Examination:  General exam: Appears calm and comfortable  HEENT: conjunctival hemorrhage of right lateral eye Respiratory system: Clear to auscultation. Respiratory effort normal. Cardiovascular system: S1 & S2 heard, RRR. No murmurs, rubs, gallops or clicks. Gastrointestinal system: Abdomen is nondistended, soft and nontender. No organomegaly or masses felt. Normal bowel sounds heard. Central nervous system: Alert and oriented. No focal neurological deficits. Musculoskeletal: No edema. No calf tenderness Skin: No cyanosis. No rashes Psychiatry: Judgement and insight appear normal. Mood & affect appropriate.     Data Reviewed: I have personally reviewed following labs and imaging studies  CBC Lab Results  Component Value Date   WBC 6.3 07/02/2020   RBC 3.43 (L) 07/02/2020   HGB 12.0 (L) 07/02/2020   HCT 35.1 (L) 07/02/2020   MCV 102.3 (H) 07/02/2020   MCH 35.0 (H)  07/02/2020   PLT 187 07/02/2020   MCHC 34.2 07/02/2020   RDW 12.6 07/02/2020   LYMPHSABS 1.3 06/29/2020   MONOABS 1.0 06/29/2020   EOSABS 0.0 06/29/2020   BASOSABS 0.0 06/29/2020     Last metabolic panel Lab Results  Component Value Date   NA 130 (L) 07/02/2020   K 3.5 07/02/2020   CL 100 07/02/2020   CO2 22  07/02/2020   BUN 10 07/02/2020   CREATININE 0.73 07/02/2020   GLUCOSE 210 (H) 07/02/2020   GFRNONAA >60 07/02/2020   CALCIUM 8.6 (L) 07/02/2020   PHOS 3.3 07/01/2020   PROT 5.9 (L) 07/01/2020   ALBUMIN 3.2 (L) 07/01/2020   BILITOT 1.8 (H) 07/01/2020   ALKPHOS 72 07/01/2020   AST 43 (H) 07/01/2020   ALT 19 07/01/2020   ANIONGAP 8 07/02/2020    CBG (last 3)  No results for input(s): GLUCAP in the last 72 hours.   GFR: Estimated Creatinine Clearance: 81.9 mL/min (by C-G formula based on SCr of 0.73 mg/dL).  Coagulation Profile: No results for input(s): INR, PROTIME in the last 168 hours.  Recent Results (from the past 240 hour(s))  Resp Panel by RT-PCR (Flu A&B, Covid) Nasopharyngeal Swab     Status: None   Collection Time: 06/29/20  8:32 PM   Specimen: Nasopharyngeal Swab; Nasopharyngeal(NP) swabs in vial transport medium  Result Value Ref Range Status   SARS Coronavirus 2 by RT PCR NEGATIVE NEGATIVE Final    Comment: (NOTE) SARS-CoV-2 target nucleic acids are NOT DETECTED.  The SARS-CoV-2 RNA is generally detectable in upper respiratory specimens during the acute phase of infection. The lowest concentration of SARS-CoV-2 viral copies this assay can detect is 138 copies/mL. A negative result does not preclude SARS-Cov-2 infection and should not be used as the sole basis for treatment or other patient management decisions. A negative result may occur with  improper specimen collection/handling, submission of specimen other than nasopharyngeal swab, presence of viral mutation(s) within the areas targeted by this assay, and inadequate number of viral copies(<138 copies/mL). A negative result must be combined with clinical observations, patient history, and epidemiological information. The expected result is Negative.  Fact Sheet for Patients:  BloggerCourse.com  Fact Sheet for Healthcare Providers:   SeriousBroker.it  This test is no t yet approved or cleared by the [place] FDA and  has been authorized for detection and/or diagnosis of SARS-CoV-2 by FDA under an Emergency Use Authorization (EUA). This EUA will remain  in effect (meaning this test can be used) for the duration of the COVID-19 declaration under Section 564(b)(1) of the Act, 21 U.S.C.section 360bbb-3(b)(1), unless the authorization is terminated  or revoked sooner.       Influenza A by PCR NEGATIVE NEGATIVE Final   Influenza B by PCR NEGATIVE NEGATIVE Final    Comment: (NOTE) The Xpert Xpress SARS-CoV-2/FLU/RSV plus assay is intended as an aid in the diagnosis of influenza from Nasopharyngeal swab specimens and should not be used as a sole basis for treatment. Nasal washings and aspirates are unacceptable for Xpert Xpress SARS-CoV-2/FLU/RSV testing.  Fact Sheet for Patients: BloggerCourse.com  Fact Sheet for Healthcare Providers: SeriousBroker.it  This test is not yet approved or cleared by the [place] FDA and has been authorized for detection and/or diagnosis of SARS-CoV-2 by FDA under an Emergency Use Authorization (EUA). This EUA will remain in effect (meaning this test can be used) for the duration of the COVID-19 declaration under Section 564(b)(1) of the Act, 21 U.S.C.  section 360bbb-3(b)(1), unless the authorization is terminated or revoked.  Performed at [facility], [address] [place], [place] [address]   MRSA PCR Screening     Status: None   Collection Time: 06/29/20 11:40 PM   Specimen: Nasopharyngeal  Result Value Ref Range Status   MRSA by PCR NEGATIVE NEGATIVE Final    Comment:        The GeneXpert MRSA Assay (FDA approved for NASAL specimens only), is one component of a comprehensive MRSA colonization surveillance program. It is not intended to diagnose MRSA infection nor  to guide or monitor treatment for MRSA infections. Performed at [facility], [address], [place], [place] [address]         Radiology Studies: US Abdomen Limited RUQ (LIVER/GB)  Result Date: 07/02/2020 CLINICAL DATA:  Elevated bilirubin EXAM: ULTRASOUND ABDOMEN LIMITED RIGHT UPPER QUADRANT COMPARISON:  None. FINDINGS: Gallbladder: No gallstones or wall thickening visualized. No sonographic Murphy sign noted by sonographer. Common bile duct: Diameter: 2 mm Liver: No focal lesion identified. Within normal limits in parenchymal echogenicity. Portal vein is patent on color Doppler imaging with normal direction of blood flow towards the liver. Other: None. IMPRESSION: Unremarkable exam. Electronically Signed   By: Katherine Mantle M.D.   On: 07/02/2020 02:44        Scheduled Meds: . Chlorhexidine Gluconate Cloth  6 each Topical Daily  . cloNIDine  0.1 mg Oral BID  . cyanocobalamin  1,000 mcg Intramuscular Daily  . feeding supplement  237 mL Oral BID BM  . folic acid  1 mg Oral Daily  . gabapentin  300 mg Oral TID  . mouth rinse  15 mL Mouth Rinse BID  . multivitamin with minerals  1 tablet Oral Daily  . nicotine  21 mg Transdermal Daily  . thiamine  100 mg Oral Daily   Or  . thiamine  100 mg Intravenous Daily   Continuous Infusions:   LOS: 3 days     Jacquelin Hawking, MD [facility] 07/02/2020, 8:16 AM  If 7PM-7AM, please contact night-coverage [email or web address]

## 2020-07-03 ENCOUNTER — Inpatient Hospital Stay (HOSPITAL_COMMUNITY): Payer: Self-pay

## 2020-07-03 NOTE — Progress Notes (Signed)
PROGRESS NOTE    CABLE FEARN  MVH:[id number] DOB: 05-08-1956 DOA: 06/29/2020 PCP: Patient, No Pcp Per   Brief Narrative: EYAN HAGOOD is a 64 y.o. male with history of alcohol and tobacco use disorder. Patient presented secondary to fall and head injury, found to have a nasal fracture in addition to significant hyponatremia. Started on IV fluids.   Assessment & Plan:   Principal Problem:   Hyponatremia Active Problems:   Alcohol use   Tobacco use   Malnutrition of moderate degree   Hyperbilirubinemia   Hypokalemia   Hyponatremia Likely chronic. Patient with a fall prior to admission. Sodium of 111 on admission. Initially given a NS bolus given on admission followed by NS at 50 ml/hr with rapid improvement of sodium to 120 within 6 hours. IV fluids discontinued. Sodium continuing to increase with increased dietary intake and is in an adequate range of 130.  Nasal bone facture Left. Displaced medially. Recommendation for outpatient ENT follow-up  Fall Initially thought secondary to alcohol intoxication but possibly related to hyponatremia as well. OT recommending SNF -PT/OT eval -TOC consult for likely SNF placement  Alcohol abuse Continues to have low CIWA scores. -Continue CIWA  Tobacco use Cessation discussed -Continue nicotine patch  Bilateral neuropathic pain Patient states that this is somewhat new. CT cervical spine significant for moderate-severe C5-C6/C6-C7 stenosis. 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  Elevated BP Started on clonidine -Continue clonidine  Hyperbilirubinemia Likely related alcohol use. AST of 43 in setting of alcohol use. RUQ ultrasound with no evidence of cirrhosis  Hypokalemia -Supplementation   DVT prophylaxis: SCDs Code Status:   Code Status: Full Code Family Communication: None at bedside Disposition Plan: Anticipate discharge  to SNF. Medically stable for discharge pending PT/OT recommendations and likely SNF bed availability   Consultants:   Neurosurgery  Procedures:   None  Antimicrobials:  None    Subjective: Complains about numbness in his hands.  Objective: Vitals:   07/02/20 1426 07/02/20 2019 07/02/20 2237 07/03/20 0641  BP: 122/88 104/89 (!) 140/102 (!) 149/97  Pulse: 81  71 64  Resp: 15 18 15 18   Temp: 97.8 F (36.6 C) 97.9 F (36.6 C) 98.5 F (36.9 C) 98.3 F (36.8 C)  TempSrc: Oral Oral Oral Oral  SpO2: 100% 100% 100% 100%  Weight:      Height:        Intake/Output Summary (Last 24 hours) at 07/03/2020 1026 Last data filed at 07/03/2020 0600 Gross per 24 hour  Intake 1660 ml  Output --  Net 1660 ml   Filed Weights   06/29/20 2300  Weight: 61.3 kg    Examination:  General exam: Appears calm and comfortable Respiratory system: Clear to auscultation. Respiratory effort normal. Cardiovascular system: S1 & S2 heard, RRR. No murmurs, rubs, gallops or clicks. Gastrointestinal system: Abdomen is nondistended, soft and nontender. No organomegaly or masses felt. Normal bowel sounds heard. Central nervous system: Alert and oriented. Bilateral upper extremity strength of 3-4/5 with mild numbness of hands/forearm/arms bilaterally Musculoskeletal: No edema. No calf tenderness Skin: No cyanosis. No rashes Psychiatry: Judgement and insight appear normal. Mood & affect appropriate.     Data Reviewed: I have personally reviewed following labs and imaging studies  CBC Lab Results  Component Value Date   WBC 6.3 07/02/2020   RBC 3.43 (L) 07/02/2020   HGB 12.0 (L) 07/02/2020   HCT 35.1 (L) 07/02/2020   MCV 102.3 (  H) 07/02/2020   MCH 35.0 (H) 07/02/2020   PLT 187 07/02/2020   MCHC 34.2 07/02/2020   RDW 12.6 07/02/2020   LYMPHSABS 1.3 06/29/2020   MONOABS 1.0 06/29/2020   EOSABS 0.0 06/29/2020   BASOSABS 0.0 06/29/2020     Last metabolic panel Lab Results  Component  Value Date   NA 130 (L) 07/02/2020   K 3.5 07/02/2020   CL 100 07/02/2020   CO2 22 07/02/2020   BUN 10 07/02/2020   CREATININE 0.73 07/02/2020   GLUCOSE 210 (H) 07/02/2020   GFRNONAA >60 07/02/2020   CALCIUM 8.6 (L) 07/02/2020   PHOS 3.3 07/01/2020   PROT 5.9 (L) 07/01/2020   ALBUMIN 3.2 (L) 07/01/2020   BILITOT 1.8 (H) 07/01/2020   ALKPHOS 72 07/01/2020   AST 43 (H) 07/01/2020   ALT 19 07/01/2020   ANIONGAP 8 07/02/2020    CBG (last 3)  No results for input(s): GLUCAP in the last 72 hours.   GFR: Estimated Creatinine Clearance: 81.9 mL/min (by C-G formula based on SCr of 0.73 mg/dL).  Coagulation Profile: No results for input(s): INR, PROTIME in the last 168 hours.  Recent Results (from the past 240 hour(s))  Resp Panel by RT-PCR (Flu A&B, Covid) Nasopharyngeal Swab     Status: None   Collection Time: 06/29/20  8:32 PM   Specimen: Nasopharyngeal Swab; Nasopharyngeal(NP) swabs in vial transport medium  Result Value Ref Range Status   SARS Coronavirus 2 by RT PCR NEGATIVE NEGATIVE Final    Comment: (NOTE) SARS-CoV-2 target nucleic acids are NOT DETECTED.  The SARS-CoV-2 RNA is generally detectable in upper respiratory specimens during the acute phase of infection. The lowest concentration of SARS-CoV-2 viral copies this assay can detect is 138 copies/mL. A negative result does not preclude SARS-Cov-2 infection and should not be used as the sole basis for treatment or other patient management decisions. A negative result may occur with  improper specimen collection/handling, submission of specimen other than nasopharyngeal swab, presence of viral mutation(s) within the areas targeted by this assay, and inadequate number of viral copies(<138 copies/mL). A negative result must be combined with clinical observations, patient history, and epidemiological information. The expected result is Negative.  Fact Sheet for Patients:   BloggerCourse.comhttps://www.fda.gov/media/152166/download  Fact Sheet for Healthcare Providers:  SeriousBroker.ithttps://www.fda.gov/media/152162/download  This test is no t yet approved or cleared by the [place] FDA and  has been authorized for detection and/or diagnosis of SARS-CoV-2 by FDA under an Emergency Use Authorization (EUA). This EUA will remain  in effect (meaning this test can be used) for the duration of the COVID-19 declaration under Section 564(b)(1) of the Act, 21 U.S.C.section 360bbb-3(b)(1), unless the authorization is terminated  or revoked sooner.       Influenza A by PCR NEGATIVE NEGATIVE Final   Influenza B by PCR NEGATIVE NEGATIVE Final    Comment: (NOTE) The Xpert Xpress SARS-CoV-2/FLU/RSV plus assay is intended as an aid in the diagnosis of influenza from Nasopharyngeal swab specimens and should not be used as a sole basis for treatment. Nasal washings and aspirates are unacceptable for Xpert Xpress SARS-CoV-2/FLU/RSV testing.  Fact Sheet for Patients: BloggerCourse.comhttps://www.fda.gov/media/152166/download  Fact Sheet for Healthcare Providers: SeriousBroker.ithttps://www.fda.gov/media/152162/download  This test is not yet approved or cleared by the [place] FDA and has been authorized for detection and/or diagnosis of SARS-CoV-2 by FDA under an Emergency Use Authorization (EUA). This EUA will remain in effect (meaning this test can be used) for the duration of the COVID-19 declaration under  Section 564(b)(1) of the Act, 21 U.S.C. section 360bbb-3(b)(1), unless the authorization is terminated or revoked.  Performed at [facility], [address] [place], [place] [address]   MRSA PCR Screening     Status: None   Collection Time: 06/29/20 11:40 PM   Specimen: Nasopharyngeal  Result Value Ref Range Status   MRSA by PCR NEGATIVE NEGATIVE Final    Comment:        The GeneXpert MRSA Assay (FDA approved for NASAL specimens only), is one component of a comprehensive MRSA  colonization surveillance program. It is not intended to diagnose MRSA infection nor to guide or monitor treatment for MRSA infections. Performed at [facility], [address], [place], [place] [address]         Radiology Studies: MR BRAIN WO CONTRAST  Result Date: 07/02/2020 CLINICAL DATA:  Suprasellar cyst.  Recent trauma. EXAM: MRI HEAD WITHOUT CONTRAST TECHNIQUE: Multiplanar, multiecho pulse sequences of the brain and surrounding structures were obtained without intravenous contrast. COMPARISON:  CT of the head and face 06/29/2020 FINDINGS: Brain: Moderate generalized atrophy is present. 2.0 x 1.7 x 1.6 cm. Pituitary is otherwise normal. No restricted diffusion is present. Moderate generalized atrophy is present. Moderate white matter changes are noted into the corona radiata bilaterally. The ventricles are proportionate to the degree of atrophy. Thinning of the corpus callosum is noted. No acute infarct, hemorrhage, or mass lesion is present. No significant extraaxial fluid collection is present. The internal auditory canals are within normal limits. The brainstem and cerebellum are within normal limits. Vascular: Flow is present in the major intracranial arteries. Skull and upper cervical spine: Mild degenerative changes are present in the upper cervical spine. Craniocervical junction is normal. Sinuses/Orbits: The paranasal sinuses and mastoid air cells are clear. The globes and orbits are within normal limits. Other: Right periorbital soft tissue swelling and nasal fractures are again noted. Please see CT for further detail of the acute trauma. IMPRESSION: 1. 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 2. No acute intracranial abnormality. 3. Moderate generalized atrophy and white matter disease likely reflects the sequela of chronic microvascular ischemia. 4. Right periorbital soft tissue swelling and nasal fractures are again noted. Please see CT for  further detail of the acute acute. Electronically Signed   By: Marin Roberts M.D.   On: 07/02/2020 19:08   US Abdomen Limited RUQ (LIVER/GB)  Result Date: 07/02/2020 CLINICAL DATA:  Elevated bilirubin EXAM: ULTRASOUND ABDOMEN LIMITED RIGHT UPPER QUADRANT COMPARISON:  None. FINDINGS: Gallbladder: No gallstones or wall thickening visualized. No sonographic Murphy sign noted by sonographer. Common bile duct: Diameter: 2 mm Liver: No focal lesion identified. Within normal limits in parenchymal echogenicity. Portal vein is patent on color Doppler imaging with normal direction of blood flow towards the liver. Other: None. IMPRESSION: Unremarkable exam. Electronically Signed   By: Katherine Mantle M.D.   On: 07/02/2020 02:44        Scheduled Meds: . cloNIDine  0.1 mg Oral BID  . cyanocobalamin  1,000 mcg Intramuscular Daily  . feeding supplement  237 mL Oral BID BM  . folic acid  1 mg Oral Daily  . gabapentin  300 mg Oral TID  . mouth rinse  15 mL Mouth Rinse BID  . multivitamin with minerals  1 tablet Oral Daily  . nicotine  21 mg Transdermal Daily  . thiamine  100 mg Oral Daily   Or  . thiamine  100 mg Intravenous Daily   Continuous Infusions:  LOS: 4 days     Jacquelin Hawking, MD [facility] 07/03/2020, 10:26 AM  If 7PM-7AM, please contact night-coverage [email or web address]

## 2020-07-03 NOTE — Consult Note (Signed)
Reason for Consult: spinal stenosis Referring Physician: [name] is an 64 y.o. male.   HPI:  64 year old male presenting to the hospital on Monday after sustaining a fall off of a curb and had brief loss of consciousness.  He has a history of alcoholism.  Is a difficult social situation.  He is currently homeless and has no phone.  It was reported that last several days he has had intact strength in his upper and lower extremities.  Today he complains of some numbness and tingling in his hands as well as some upper extremity weakness.  Denies any gait disturbances.  Denies any pain down his arms.  He does endorse numbness and tingling in his hands.  Reports some neck pain.  Past Medical History:  Diagnosis Date  . Alcohol use     Past Surgical History:  Procedure Laterality Date  . ulcer surgery in stomach     pt states he had this approx 33 years ago    No Known Allergies  Social History   Tobacco Use  . Smoking status: Current Every Day Smoker    Packs/day: 1.00    Years: 40.00    Pack years: 40.00    Types: Cigarettes  . Smokeless tobacco: Never Used  Substance Use Topics  . Alcohol use: Yes    Comment: "lots" on 06/29/20    History reviewed. No pertinent family history.   Review of Systems  Positive ROS: As above  All other systems have been reviewed and were otherwise negative with the exception of those mentioned in the HPI and as above.  Objective: Vital signs in last 24 hours: Temp:  [97.6 F (36.4 C)-98.5 F (36.9 C)] 97.6 F (36.4 C) (11/26 1347) Pulse Rate:  [64-77] 72 (11/26 1347) Resp:  [14-18] 14 (11/26 1347) BP: (132-149)/(86-102) 136/92 (11/26 1347) SpO2:  [100 %] 100 % (11/26 1347)  General Appearance: Alert, cooperative, no distress, appears stated age Head: Normocephalic, without obvious abnormality, atraumatic Eyes: PERRL, conjunctiva/corneas clear, EOM's intact, fundi benign, both eyes      Lungs: respirations  unlabored Heart: Regular rate and rhythm Extremities: Extremities normal, atraumatic, no cyanosis or edema Pulses: 2+ and symmetric all extremities Skin: Skin color, texture, turgor normal, no rashes or lesions  NEUROLOGIC:   Mental status: A&O x4, no aphasia, good attention span, Memory and fund of knowledge Motor Exam - grossly normal, normal tone and bulk, upper and lower extremities are 5 out of 5 in strength Sensory Exam - grossly normal Reflexes: symmetric, no pathologic reflexes, No Hoffman's, No clonus Coordination - grossly normal Gait - grossly normal Balance - grossly normal Cranial Nerves: I: smell Not tested  II: visual acuity  OS: na  OD: na  II: visual fields Full to confrontation  II: pupils Equal, round, reactive to light  III,VII: ptosis None  III,IV,VI: extraocular muscles  Full ROM  V: mastication   V: facial light touch sensation    V,VII: corneal reflex    VII: facial muscle function - upper    VII: facial muscle function - lower   VIII: hearing   IX: soft palate elevation    IX,X: gag reflex   XI: trapezius strength    XI: sternocleidomastoid strength   XI: neck flexion strength    XII: tongue strength      Data Review Lab Results  Component Value Date   WBC 6.3 07/02/2020   HGB 12.0 (L) 07/02/2020   HCT 35.1 (L) 07/02/2020  MCV 102.3 (H) 07/02/2020   PLT 187 07/02/2020   Lab Results  Component Value Date   NA 130 (L) 07/02/2020   K 3.5 07/02/2020   CL 100 07/02/2020   CO2 22 07/02/2020   BUN 10 07/02/2020   CREATININE 0.73 07/02/2020   GLUCOSE 210 (H) 07/02/2020   No results found for: INR, PROTIME  Radiology: CT Head Wo Contrast  Result Date: 06/29/2020 CLINICAL DATA:  Facial trauma. Additional history provided: Status post mechanical fall off curb, bilateral hip pain, head trauma. EXAM: CT HEAD WITHOUT CONTRAST CT MAXILLOFACIAL WITHOUT CONTRAST CT CERVICAL SPINE WITHOUT CONTRAST TECHNIQUE: Multidetector CT imaging of the head,  cervical spine, and maxillofacial structures were performed using the standard protocol without intravenous contrast. Multiplanar CT image reconstructions of the cervical spine and maxillofacial structures were also generated. COMPARISON:  CT head/cervical spine 03/25/2014. FINDINGS: CT HEAD FINDINGS Brain: Mild generalized cerebral atrophy. Redemonstrated CSF density cystic lesion in the suprasellar region. This is unchanged in size as compared to the head CT of 03/25/2014, again measuring up to 3.6 cm in greatest single dimension. There is no acute intracranial hemorrhage. No demarcated cortical infarct. No midline shift. Vascular: No hyperdense vessel.  Atherosclerotic calcifications Skull: Normal. Negative for fracture or focal lesion. Other: Large right forehead/right periorbital hematoma. CT MAXILLOFACIAL FINDINGS Osseous: Medially displaced fracture of the left nasal bone, new as compared to the prior head CT of 03/25/2014, but otherwise age indeterminate. No other maxillofacial fracture is identified. Orbits: Prominent right periorbital hematoma. Otherwise, no acute finding. The globes are normal in size and contour. The extraocular muscles and optic nerve sheath complexes are symmetric and unremarkable. Sinuses: Trace scattered paranasal sinus mucosal thickening. No air-fluid levels. Soft tissues: Prominent right forehead/periorbital hematoma. Other: S-shaped deviation of the bony nasal septum. Small nasal septal defect (series 9, image 22). CT CERVICAL SPINE FINDINGS Alignment: Straightening of the expected cervical lordosis. Trace C4-C5 grade 1 anterolisthesis. Skull base and vertebrae: The basion-dental and atlanto-dental intervals are maintained.No evidence of acute fracture to the cervical spine. Prominent multilevel ventral osteophytes. Soft tissues and spinal canal: No prevertebral fluid or swelling. No visible canal hematoma. Disc levels: Cervical spondylosis with multilevel disc space narrowing,  disc bulges, posterior disc osteophytes, uncovertebral hypertrophy and facet arthrosis. There is moderate (and possibly severe) no canal stenosis at C5-C6 and C6-C7. Multilevel bony neural foraminal narrowing. Upper chest: Paraseptal emphysema.  No visible pneumothorax. IMPRESSION: CT head: 1. No acute posttraumatic intracranial findings. 2. Prominent right forehead/periorbital hematoma. 3. Cystic lesion within the suprasellar region measuring 3.6 cm in greatest single dimension, unchanged in size as compared to the head CT of 03/25/2014. Differential considerations include epidermoid, arachnoid cyst or subarachnoid adhesion. Nonemergent contrast-enhanced MRI is recommended if not already performed. 4. Mild cerebral atrophy. CT maxillofacial: 1. Medially displaced fracture of the left nasal bone, new as compared to the head CT of 03/25/2014 but otherwise age indeterminate. 2. No other maxillofacial fracture is identified. 3. Prominent right forehead/periorbital hematoma. 4. S-shaped deviation of the bony nasal septum. Small nasal septal defect. CT cervical spine: 1. No evidence of acute fracture to the cervical spine. 2. Mild chronic C4-C5 grade 1 anterolisthesis. 3. Cervical spondylosis as described. Suspect moderate (and possibly severe) spinal canal stenosis at C5-C6 and C6-C7 4.  Emphysema (ICD10-J43.9). Electronically Signed   By: Jackey LogeKyle  Golden DO   On: 06/29/2020 20:48   CT Cervical Spine Wo Contrast  Result Date: 06/29/2020 CLINICAL DATA:  Facial trauma. Additional history provided: Status post mechanical  fall off curb, bilateral hip pain, head trauma. EXAM: CT HEAD WITHOUT CONTRAST CT MAXILLOFACIAL WITHOUT CONTRAST CT CERVICAL SPINE WITHOUT CONTRAST TECHNIQUE: Multidetector CT imaging of the head, cervical spine, and maxillofacial structures were performed using the standard protocol without intravenous contrast. Multiplanar CT image reconstructions of the cervical spine and maxillofacial structures were  also generated. COMPARISON:  CT head/cervical spine 03/25/2014. FINDINGS: CT HEAD FINDINGS Brain: Mild generalized cerebral atrophy. Redemonstrated CSF density cystic lesion in the suprasellar region. This is unchanged in size as compared to the head CT of 03/25/2014, again measuring up to 3.6 cm in greatest single dimension. There is no acute intracranial hemorrhage. No demarcated cortical infarct. No midline shift. Vascular: No hyperdense vessel.  Atherosclerotic calcifications Skull: Normal. Negative for fracture or focal lesion. Other: Large right forehead/right periorbital hematoma. CT MAXILLOFACIAL FINDINGS Osseous: Medially displaced fracture of the left nasal bone, new as compared to the prior head CT of 03/25/2014, but otherwise age indeterminate. No other maxillofacial fracture is identified. Orbits: Prominent right periorbital hematoma. Otherwise, no acute finding. The globes are normal in size and contour. The extraocular muscles and optic nerve sheath complexes are symmetric and unremarkable. Sinuses: Trace scattered paranasal sinus mucosal thickening. No air-fluid levels. Soft tissues: Prominent right forehead/periorbital hematoma. Other: S-shaped deviation of the bony nasal septum. Small nasal septal defect (series 9, image 22). CT CERVICAL SPINE FINDINGS Alignment: Straightening of the expected cervical lordosis. Trace C4-C5 grade 1 anterolisthesis. Skull base and vertebrae: The basion-dental and atlanto-dental intervals are maintained.No evidence of acute fracture to the cervical spine. Prominent multilevel ventral osteophytes. Soft tissues and spinal canal: No prevertebral fluid or swelling. No visible canal hematoma. Disc levels: Cervical spondylosis with multilevel disc space narrowing, disc bulges, posterior disc osteophytes, uncovertebral hypertrophy and facet arthrosis. There is moderate (and possibly severe) no canal stenosis at C5-C6 and C6-C7. Multilevel bony neural foraminal narrowing.  Upper chest: Paraseptal emphysema.  No visible pneumothorax. IMPRESSION: CT head: 1. No acute posttraumatic intracranial findings. 2. Prominent right forehead/periorbital hematoma. 3. Cystic lesion within the suprasellar region measuring 3.6 cm in greatest single dimension, unchanged in size as compared to the head CT of 03/25/2014. Differential considerations include epidermoid, arachnoid cyst or subarachnoid adhesion. Nonemergent contrast-enhanced MRI is recommended if not already performed. 4. Mild cerebral atrophy. CT maxillofacial: 1. Medially displaced fracture of the left nasal bone, new as compared to the head CT of 03/25/2014 but otherwise age indeterminate. 2. No other maxillofacial fracture is identified. 3. Prominent right forehead/periorbital hematoma. 4. S-shaped deviation of the bony nasal septum. Small nasal septal defect. CT cervical spine: 1. No evidence of acute fracture to the cervical spine. 2. Mild chronic C4-C5 grade 1 anterolisthesis. 3. Cervical spondylosis as described. Suspect moderate (and possibly severe) spinal canal stenosis at C5-C6 and C6-C7 4.  Emphysema (ICD10-J43.9). Electronically Signed   By: Jackey Loge DO   On: 06/29/2020 20:48   DG Hip Unilat With Pelvis 2-3 Views Left  Result Date: 06/29/2020 CLINICAL DATA:  Hip pain history of fall EXAM: DG HIP (WITH OR WITHOUT PELVIS) 2-3V LEFT COMPARISON:  None. FINDINGS: SI joints are non widened. The pubic symphysis and rami appear intact. No acute displaced fracture or malalignment. 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 Vascular calcifications. IMPRESSION: No acute osseous abnormality. Electronically Signed   By: Jasmine Pang M.D.   On: 06/29/2020 18:34   DG Hip Unilat  With Pelvis 2-3 Views Right  Result Date: 06/29/2020 CLINICAL DATA:  Hip pain after fall EXAM: DG  HIP (WITH OR WITHOUT PELVIS) 2-3V RIGHT COMPARISON:  None. FINDINGS: No fracture or malalignment. Moderate arthritis of the right hip. Right pubic rami  appear intact. IMPRESSION: No acute osseous abnormality. Electronically Signed   By: Jasmine Pang M.D.   On: 06/29/2020 18:32   CT Maxillofacial Wo Contrast  Result Date: 06/29/2020 CLINICAL DATA:  Facial trauma. Additional history provided: Status post mechanical fall off curb, bilateral hip pain, head trauma. EXAM: CT HEAD WITHOUT CONTRAST CT MAXILLOFACIAL WITHOUT CONTRAST CT CERVICAL SPINE WITHOUT CONTRAST TECHNIQUE: Multidetector CT imaging of the head, cervical spine, and maxillofacial structures were performed using the standard protocol without intravenous contrast. Multiplanar CT image reconstructions of the cervical spine and maxillofacial structures were also generated. COMPARISON:  CT head/cervical spine 03/25/2014. FINDINGS: CT HEAD FINDINGS Brain: Mild generalized cerebral atrophy. Redemonstrated CSF density cystic lesion in the suprasellar region. This is unchanged in size as compared to the head CT of 03/25/2014, again measuring up to 3.6 cm in greatest single dimension. There is no acute intracranial hemorrhage. No demarcated cortical infarct. No midline shift. Vascular: No hyperdense vessel.  Atherosclerotic calcifications Skull: Normal. Negative for fracture or focal lesion. Other: Large right forehead/right periorbital hematoma. CT MAXILLOFACIAL FINDINGS Osseous: Medially displaced fracture of the left nasal bone, new as compared to the prior head CT of 03/25/2014, but otherwise age indeterminate. No other maxillofacial fracture is identified. Orbits: Prominent right periorbital hematoma. Otherwise, no acute finding. The globes are normal in size and contour. The extraocular muscles and optic nerve sheath complexes are symmetric and unremarkable. Sinuses: Trace scattered paranasal sinus mucosal thickening. No air-fluid levels. Soft tissues: Prominent right forehead/periorbital hematoma. Other: S-shaped deviation of the bony nasal septum. Small nasal septal defect (series 9, image 22). CT  CERVICAL SPINE FINDINGS Alignment: Straightening of the expected cervical lordosis. Trace C4-C5 grade 1 anterolisthesis. Skull base and vertebrae: The basion-dental and atlanto-dental intervals are maintained.No evidence of acute fracture to the cervical spine. Prominent multilevel ventral osteophytes. Soft tissues and spinal canal: No prevertebral fluid or swelling. No visible canal hematoma. Disc levels: Cervical spondylosis with multilevel disc space narrowing, disc bulges, posterior disc osteophytes, uncovertebral hypertrophy and facet arthrosis. There is moderate (and possibly severe) no canal stenosis at C5-C6 and C6-C7. Multilevel bony neural foraminal narrowing. Upper chest: Paraseptal emphysema.  No visible pneumothorax. IMPRESSION: CT head: 1. No acute posttraumatic intracranial findings. 2. Prominent right forehead/periorbital hematoma. 3. Cystic lesion within the suprasellar region measuring 3.6 cm in greatest single dimension, unchanged in size as compared to the head CT of 03/25/2014. Differential considerations include epidermoid, arachnoid cyst or subarachnoid adhesion. Nonemergent contrast-enhanced MRI is recommended if not already performed. 4. Mild cerebral atrophy. CT maxillofacial: 1. Medially displaced fracture of the left nasal bone, new as compared to the head CT of 03/25/2014 but otherwise age indeterminate. 2. No other maxillofacial fracture is identified. 3. Prominent right forehead/periorbital hematoma. 4. S-shaped deviation of the bony nasal septum. Small nasal septal defect. CT cervical spine: 1. No evidence of acute fracture to the cervical spine. 2. Mild chronic C4-C5 grade 1 anterolisthesis. 3. Cervical spondylosis as described. Suspect moderate (and possibly severe) spinal canal stenosis at C5-C6 and C6-C7 4.  Emphysema (ICD10-J43.9). Electronically Signed   By: Jackey Loge DO   On: 06/29/2020 20:48     Assessment/Plan: 64 year old male presented to the hospital on Monday  after sustaining a fall and losing consciousness.  CT cervical spine on Monday showed moderate spinal stenosis at C3-4 and C5-C6.  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  Upon examination, I not pick up on any focal or generalized weakness in his upper extremities.  He is not myelopathic.  His gait is stable.  No clonus or Hoffmann sign.  Would recommend putting him on gabapentin for seizures.  Could consider placing him on Decadron.  However, he does have a history of stomach ulcers so would need to weigh the options of this.  He is going to need a two-level fusion at C3-4 and C5-C6.  However, this does not need to be done emergently since he is neurologically intact.  Discussed follow-up in our office next week with the patient.  We did discuss surgical intervention that he will need.  I will place our address and phone number in the chart for follow-up.   Tiana Loft [facility] 07/03/2020 8:42 PM

## 2020-07-03 NOTE — Evaluation (Signed)
Physical Therapy Evaluation Patient Details Name: Cody Glenn MRN: [id number] DOB: 04/04/1956 Today's Date: 07/03/2020   History of Present Illness  Patient is a 64 year old male history of alcohol and tobacco use disorder. Patient presented secondary to fall and head injury, found to have a nasal fracture and significant hyponatremia.   Clinical Impression  Patient evaluated by Physical Therapy with no further acute PT needs identified. All education has been completed and the patient has no further questions.  Pt up in room amb on PT arrival, folding wash cloths. He reports some baseline balance deficits. Demo's baseline compensatory techniques. amb hallway distance without LOB with challenges as noted below.  He is able to maneuver in tighter spaces of room and even move rolling nightstand in standing and from sitting position.  See below for any follow-up Physical Therapy or equipment needs. PT is signing off. Thank you for this referral.     Follow Up Recommendations No PT follow up    Equipment Recommendations  None recommended by PT    Recommendations for Other Services       Precautions / Restrictions Precautions Precautions: Fall Restrictions Weight Bearing Restrictions: No      Mobility  Bed Mobility Overal bed mobility: Independent                  Transfers Overall transfer level: Independent Equipment used: None Transfers: Sit to/from [employer] transfer comment: no physical assist. pt up inroom folding wash cloths on arrival  Ambulation/Gait Ambulation/Gait assistance: Supervision Gait Distance (Feet): 170 Feet Assistive device: None Gait Pattern/deviations: Drifts right/left;Wide base of support     General Gait Details: wide BOS which appears to be baseline compensatory techniques. mild drifting however no overt LOB  Stairs            Wheelchair Mobility    Modified Rankin (Stroke Patients Only)        Balance Overall balance assessment: History of Falls (pt reports hx falls, most recent prior to this admission, he reports LOC)   Sitting balance-Leahy Scale: Normal     Standing balance support: During functional activity;No upper extremity supported Standing balance-Leahy Scale: Good Standing balance comment: good-, able to  tolerate min perturbations, decr reactions overall with early steppage response             High level balance activites: Side stepping;Direction changes;Turns;Head turns;Backward walking;Sudden stops High Level Balance Comments: no LOB with above, wide BOS             Pertinent Vitals/Pain Pain Assessment: No/denies pain    Home Living Family/patient expects to be discharged to:: Shelter/Homeless     Type of Home:  (rented a room "awhile ago" which he no longer has)         Home Equipment: None      Prior Function Level of Independence: Independent               Hand Dominance        Extremity/Trunk Assessment   Upper Extremity Assessment Upper Extremity Assessment: Defer to OT evaluation    Lower Extremity Assessment Lower Extremity Assessment: Overall WFL for tasks assessed RLE Deficits / Details: strength and AROM grossly WFL fro activites tested RLE Coordination: decreased fine motor LLE Coordination: decreased fine motor       Communication   Communication: No difficulties  Cognition Arousal/Alertness: Awake/alert Behavior During Therapy: WFL for tasks assessed/performed Overall Cognitive  Status: Within Functional Limits for tasks assessed                                        General Comments      Exercises     Assessment/Plan    PT Assessment    PT Problem List         PT Treatment Interventions      PT Goals (Current goals can be found in the Care Plan section)  Acute Rehab PT Goals Patient Stated Goal: see SW PT Goal Formulation: With patient Time For Goal Achievement:  07/03/20 Potential to Achieve Goals: Good    Frequency     Barriers to discharge        Co-evaluation               AM-PAC PT "6 Clicks" Mobility  Outcome Measure Help needed turning from your back to your side while in a flat bed without using bedrails?: None Help needed moving from lying on your back to sitting on the side of a flat bed without using bedrails?: None Help needed moving to and from a bed to a chair (including a wheelchair)?: None Help needed standing up from a chair using your arms (e.g., wheelchair or bedside chair)?: None Help needed to walk in hospital room?: None Help needed climbing 3-5 steps with a railing? : None 6 Click Score: 24    End of Session Equipment Utilized During Treatment: Gait belt Activity Tolerance: Patient tolerated treatment well Patient left: with call bell/phone within reach;in bed (no alarms on PT arrival)   PT Visit Diagnosis: Difficulty in walking, not elsewhere classified (R26.2)    Time: 9528-4132 PT Time Calculation (min) (ACUTE ONLY): 31 min   Charges:   PT Evaluation $PT Eval Low Complexity: 1 Low PT Treatments $Gait Training: 8-22 mins        Delice Bison, PT  Acute Rehab Dept ([facility]) [phone] Pager [phone]  07/03/2020   [facility] 07/03/2020, 12:52 PM

## 2020-07-03 NOTE — TOC Transition Note (Signed)
Transition of Care [facility]) - CM/SW Discharge Note   Patient Details  Name: Cody Glenn MRN: [id number] Date of Birth: Oct 10, 1955  Transition of Care [facility]) CM/SW Contact:  Lennart Pall, LCSW Phone Number: 07/03/2020, 12:25 PM   Clinical Narrative:    Met with pt to review dc needs.  Pt up and independent in his room and he understands that initial recommendation for SNF is no longer needed.  Pt confirms that he is homeless but may be able to stay with a family member of friend.  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  He uses both the [place] in [place] and [facility] for medical needs.  Pt reports he is ready for dc and grateful for 2 bus passes provided.  No further TOC needs.   Final next level of care: Homeless Shelter Barriers to Discharge: Barriers Resolved   Patient Goals and CMS Choice Patient states their goals for this hospitalization and ongoing recovery are:: to go home CMS Medicare.gov Compare Post Acute Care list provided to:: Patient Choice offered to / list presented to : NA  Discharge Placement                       Discharge Plan and Services In-house Referral: Financial Counselor Discharge Planning Services: CM Consult, Medication Assistance, Other - See comment (substance abuse resources)            DME Arranged: N/A DME Agency: NA       HH Arranged: NA HH Agency: NA        Social Determinants of Health (SDOH) Interventions     Readmission Risk Interventions No flowsheet data found.

## 2020-07-04 NOTE — Progress Notes (Signed)
PROGRESS NOTE    Cody NearingMichael G Farewell  NGE:[id number] DOB: 06/21/1956 DOA: 06/29/2020 PCP: Patient, No Pcp Per   Brief Narrative: Cody Glenn is a 64 y.o. male with history of alcohol and tobacco use disorder. Patient presented secondary to fall and head injury, found to have a nasal fracture in addition to significant hyponatremia. Started on IV fluids.   Assessment & Plan:   Principal Problem:   Hyponatremia Active Problems:   Alcohol use   Tobacco use   Malnutrition of moderate degree   Hyperbilirubinemia   Hypokalemia   Hyponatremia Likely chronic. Patient with a fall prior to admission. Sodium of 111 on admission. Initially given a NS bolus given on admission followed by NS at 50 ml/hr with rapid improvement of sodium to 120 within 6 hours. IV fluids discontinued. Sodium continuing to increase with increased dietary intake and is in an adequate range of 130.  Nasal bone facture Left. Displaced medially. Recommendation for outpatient ENT follow-up  Fall Initially thought secondary to alcohol intoxication but possibly related to hyponatremia as well. OT recommending SNF but PT recommending no PT follow-up.  Alcohol abuse Continues to have low CIWA scores. -Continue CIWA  Tobacco use Cessation discussed -Continue nicotine patch  Bilateral neuropathic pain Patient states that this is somewhat new. CT cervical spine significant for moderate-severe C5-C6/C6-C7 stenosis. MRI significant for severe stenosis at C3-4 with moderate stenosis at C5-6 and widespread severe foraminal stenosis. 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  Elevated BP Started on clonidine -Continue clonidine  Hyperbilirubinemia Likely related alcohol use. AST of 43 in setting of alcohol use. RUQ ultrasound with no evidence of  cirrhosis  Hypokalemia -Supplementation   DVT prophylaxis: SCDs Code Status:   Code Status: Full Code Family Communication: None at bedside Disposition Plan: Discharge to shelter likely in 48 hours as there is no current safe discharge at this time.    Consultants:   Neurosurgery  Procedures:   None  Antimicrobials:  None    Subjective: No significant issues overnight.  Objective: Vitals:   07/03/20 2114 07/04/20 0458 07/04/20 1320 07/04/20 1322  BP: 118/80 (!) 150/98 (!) 152/94 (!) 139/95  Pulse: 74 62 78 75  Resp: 14 16  17   Temp: 98.3 F (36.8 C) 99.2 F (37.3 C) (!) 97.5 F (36.4 C) (!) 97.5 F (36.4 C)  TempSrc: Oral Oral    SpO2: 100% 100% 100% 100%  Weight:      Height:        Intake/Output Summary (Last 24 hours) at 07/04/2020 1453 Last data filed at 07/04/2020 1323 Gross per 24 hour  Intake 1680 ml  Output --  Net 1680 ml   Filed Weights   06/29/20 2300  Weight: 61.3 kg    Examination:  General exam: Appears calm and comfortable Respiratory system: Clear to auscultation. Respiratory effort normal. Cardiovascular system: S1 & S2 heard, RRR. No murmurs, rubs, gallops or clicks. Gastrointestinal system: Abdomen is nondistended, soft and nontender. No organomegaly or masses felt. Normal bowel sounds heard. Central nervous system: Alert and oriented. Musculoskeletal: No edema. No calf tenderness. Facial swelling has improved Skin: No cyanosis. No rashes Psychiatry: Judgement and insight appear normal. Mood & affect appropriate.     Data Reviewed: I have personally reviewed following labs and imaging studies  CBC Lab Results  Component Value Date   WBC 6.3 07/02/2020   RBC 3.43 (  L) 07/02/2020   HGB 12.0 (L) 07/02/2020   HCT 35.1 (L) 07/02/2020   MCV 102.3 (H) 07/02/2020   MCH 35.0 (H) 07/02/2020   PLT 187 07/02/2020   MCHC 34.2 07/02/2020   RDW 12.6 07/02/2020   LYMPHSABS 1.3 06/29/2020   MONOABS 1.0 06/29/2020   EOSABS 0.0  06/29/2020   BASOSABS 0.0 06/29/2020     Last metabolic panel Lab Results  Component Value Date   NA 130 (L) 07/02/2020   K 3.5 07/02/2020   CL 100 07/02/2020   CO2 22 07/02/2020   BUN 10 07/02/2020   CREATININE 0.73 07/02/2020   GLUCOSE 210 (H) 07/02/2020   GFRNONAA >60 07/02/2020   CALCIUM 8.6 (L) 07/02/2020   PHOS 3.3 07/01/2020   PROT 5.9 (L) 07/01/2020   ALBUMIN 3.2 (L) 07/01/2020   BILITOT 1.8 (H) 07/01/2020   ALKPHOS 72 07/01/2020   AST 43 (H) 07/01/2020   ALT 19 07/01/2020   ANIONGAP 8 07/02/2020    CBG (last 3)  No results for input(s): GLUCAP in the last 72 hours.   GFR: Estimated Creatinine Clearance: 81.9 mL/min (by C-G formula based on SCr of 0.73 mg/dL).  Coagulation Profile: No results for input(s): INR, PROTIME in the last 168 hours.  Recent Results (from the past 240 hour(s))  Resp Panel by RT-PCR (Flu A&B, Covid) Nasopharyngeal Swab     Status: None   Collection Time: 06/29/20  8:32 PM   Specimen: Nasopharyngeal Swab; Nasopharyngeal(NP) swabs in vial transport medium  Result Value Ref Range Status   SARS Coronavirus 2 by RT PCR NEGATIVE NEGATIVE Final    Comment: (NOTE) SARS-CoV-2 target nucleic acids are NOT DETECTED.  The SARS-CoV-2 RNA is generally detectable in upper respiratory specimens during the acute phase of infection. The lowest concentration of SARS-CoV-2 viral copies this assay can detect is 138 copies/mL. A negative result does not preclude SARS-Cov-2 infection and should not be used as the sole basis for treatment or other patient management decisions. A negative result may occur with  improper specimen collection/handling, submission of specimen other than nasopharyngeal swab, presence of viral mutation(s) within the areas targeted by this assay, and inadequate number of viral copies(<138 copies/mL). A negative result must be combined with clinical observations, patient history, and epidemiological information. The expected  result is Negative.  Fact Sheet for Patients:  BloggerCourse.com  Fact Sheet for Healthcare Providers:  SeriousBroker.it  This test is no t yet approved or cleared by the [place] FDA and  has been authorized for detection and/or diagnosis of SARS-CoV-2 by FDA under an Emergency Use Authorization (EUA). This EUA will remain  in effect (meaning this test can be used) for the duration of the COVID-19 declaration under Section 564(b)(1) of the Act, 21 U.S.C.section 360bbb-3(b)(1), unless the authorization is terminated  or revoked sooner.       Influenza A by PCR NEGATIVE NEGATIVE Final   Influenza B by PCR NEGATIVE NEGATIVE Final    Comment: (NOTE) The Xpert Xpress SARS-CoV-2/FLU/RSV plus assay is intended as an aid in the diagnosis of influenza from Nasopharyngeal swab specimens and should not be used as a sole basis for treatment. Nasal washings and aspirates are unacceptable for Xpert Xpress SARS-CoV-2/FLU/RSV testing.  Fact Sheet for Patients: BloggerCourse.com  Fact Sheet for Healthcare Providers: SeriousBroker.it  This test is not yet approved or cleared by the [place] FDA and has been authorized for detection and/or diagnosis of SARS-CoV-2 by FDA under an Emergency Use Authorization (EUA). This EUA  will remain in effect (meaning this test can be used) for the duration of the COVID-19 declaration under Section 564(b)(1) of the Act, 21 U.S.C. section 360bbb-3(b)(1), unless the authorization is terminated or revoked.  Performed at [facility], [address], [place], [place] [address]   MRSA PCR Screening     Status: None   Collection Time: 06/29/20 11:40 PM   Specimen: Nasopharyngeal  Result Value Ref Range Status   MRSA by PCR NEGATIVE NEGATIVE Final    Comment:        The GeneXpert MRSA Assay (FDA approved for NASAL specimens only),  is one component of a comprehensive MRSA colonization surveillance program. It is not intended to diagnose MRSA infection nor to guide or monitor treatment for MRSA infections. Performed at [facility], [address] [place], [place] [address]         Radiology Studies: MR BRAIN WO CONTRAST  Result Date: 07/02/2020 CLINICAL DATA:  Suprasellar cyst.  Recent trauma. EXAM: MRI HEAD WITHOUT CONTRAST TECHNIQUE: Multiplanar, multiecho pulse sequences of the brain and surrounding structures were obtained without intravenous contrast. COMPARISON:  CT of the head and face 06/29/2020 FINDINGS: Brain: Moderate generalized atrophy is present. 2.0 x 1.7 x 1.6 cm. Pituitary is otherwise normal. No restricted diffusion is present. Moderate generalized atrophy is present. Moderate white matter changes are noted into the corona radiata bilaterally. The ventricles are proportionate to the degree of atrophy. Thinning of the corpus callosum is noted. No acute infarct, hemorrhage, or mass lesion is present. No significant extraaxial fluid collection is present. The internal auditory canals are within normal limits. The brainstem and cerebellum are within normal limits. Vascular: Flow is present in the major intracranial arteries. Skull and upper cervical spine: Mild degenerative changes are present in the upper cervical spine. Craniocervical junction is normal. Sinuses/Orbits: The paranasal sinuses and mastoid air cells are clear. The globes and orbits are within normal limits. Other: Right periorbital soft tissue swelling and nasal fractures are again noted. Please see CT for further detail of the acute trauma. IMPRESSION: 1. 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 2. No acute intracranial abnormality. 3. Moderate generalized atrophy and white matter disease likely reflects the sequela of chronic microvascular ischemia. 4. Right periorbital soft tissue swelling and nasal  fractures are again noted. Please see CT for further detail of the acute acute. Electronically Signed   By: Marin Roberts M.D.   On: 07/02/2020 19:08   MR CERVICAL SPINE WO CONTRAST  Result Date: 07/03/2020 CLINICAL DATA:  Fall.  Cervical spinal stenosis. EXAM: MRI CERVICAL SPINE WITHOUT CONTRAST TECHNIQUE: Multiplanar, multisequence MR imaging of the cervical spine was performed. No intravenous contrast was administered. COMPARISON:  Cervical spine CT 06/29/2020 FINDINGS: Alignment: Cervical spine straightening. Trace anterolisthesis of C4 on C5. Vertebrae: No fracture, suspicious osseous lesion, or significant marrow edema. Cord: Abnormal T2 hyperintensity in the spinal cord bilaterally at C3-4 and C5-6 most consistent with myelomalacia related to spondylosis and spinal stenosis at these levels. Posterior Fossa, vertebral arteries, paraspinal tissues: Unremarkable. Disc levels: The cervical spinal canal is small in caliber diffusely on a congenital basis. Disc space narrowing is moderate at C5-6 and severe at C6-7. C2-3: Mild disc bulging and asymmetric right uncovertebral spurring result in moderate right neural foraminal stenosis without spinal stenosis. C3-4: Disc bulging, a central disc protrusion, uncovertebral spurring, infolding of the ligamentum flavum, and mild right facet arthrosis result in severe spinal stenosis with moderate cord flattening and severe bilateral neural  foraminal stenosis. C4-5: Mild disc bulging and mild uncovertebral spurring result in mild spinal stenosis and moderate bilateral neural foraminal stenosis. C5-6: Disc bulging and uncovertebral spurring result in moderate spinal stenosis with moderate cord flattening and severe bilateral neural foraminal stenosis. C6-7: Right eccentric disc bulging and uncovertebral spurring result in mild spinal stenosis and severe bilateral neural foraminal stenosis. C7-T1: Disc bulging, uncovertebral spurring, and mild right and moderate  left facet arthrosis result in severe right and moderate left neural foraminal stenosis without significant spinal stenosis. IMPRESSION: 1. 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 2. 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 3. Widespread severe neural foraminal stenosis. Electronically Signed   By: Sebastian Ache M.D.   On: 07/03/2020 19:34        Scheduled Meds: . cloNIDine  0.1 mg Oral BID  . cyanocobalamin  1,000 mcg Intramuscular Daily  . feeding supplement  237 mL Oral BID BM  . folic acid  1 mg Oral Daily  . gabapentin  300 mg Oral TID  . mouth rinse  15 mL Mouth Rinse BID  . multivitamin with minerals  1 tablet Oral Daily  . nicotine  21 mg Transdermal Daily  . thiamine  100 mg Oral Daily   Or  . thiamine  100 mg Intravenous Daily   Continuous Infusions:   LOS: 5 days     Jacquelin Hawking, MD [facility] 07/04/2020, 2:53 PM  If 7PM-7AM, please contact night-coverage [email or web address]

## 2020-07-04 NOTE — Progress Notes (Signed)
MD made aware of R eye redness. No new orders.

## 2020-07-04 NOTE — Progress Notes (Addendum)
CSW received a call from pt's RN Caryl Pina at ph: [phone] stating pt is likely to D/C pending the MD's orders.  CSW reviewed chart and sees pt is no longer appropriate for SNF, per PT and that the LCSW from 11/26 met pt's requested needs with 2 bus passes due to pt's plan of following up with shelters and per LCSW on 11/16 there are, "no further TOC needs".  SEE note below from 11/26, please:  11/26: Met with pt to review dc needs.  Pt up and independent in his room and he understands that initial recommendation for SNF is no longer needed.  Pt confirms that he is homeless but may be able to stay with a family member of friend.  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  He uses both the [place] in [place] and [facility] for medical needs.  Pt reports he is ready for dc and grateful for 2 bus passes provided.  No further TOC needs.  *End of note from 11/26*  RN updated.  CSW will continue to follow for D/C needs.  Alphonse Guild. Tyneisha Hegeman  MSW, LCSW, LCAS, CCS Transitions of Care Clinical Social Worker Care Coordination Department Ph: [phone]

## 2020-07-04 NOTE — Progress Notes (Signed)
CSW received a call from pt's provide stating pt now at D/C is saying he has no safe D/C plan and is stating he (the pt) was under the impression social work was going to help him with shelter plans.  CSW calling shelters at this time.  CSW will continue to follow for D/C needs.  Dorothe Pea. Sadrac Zeoli  MSW, LCSW, LCAS, CCS Transitions of Care Clinical Social Worker Care Coordination Department Ph: [phone]

## 2020-07-04 NOTE — Progress Notes (Signed)
Physical Therapy Treatment Patient Details Name: Cody Glenn MRN: [id number] DOB: 1956/02/01 Today's Date: 07/04/2020    History of Present Illness Patient is a 64 year old male history of alcohol and tobacco use disorder. Patient presented secondary to fall and head injury, found to have a nasal fracture and significant hyponatremia. MRI completed.  NS recommended 2 level cervical fusion as OP    PT Comments    Pt progressing toward goals. incr activity gait tolerance (pt does report he walks " a lot" normally). He reports to PT he is unsure where he will go at d/c. [name] he knows he needs c-spine surgery however not eligible fo rmajor surgery at the [place] and he has no insurance.  Will continue to follow [place] in acute setting to improve activity tolerance and strength to his baseline   Follow Up Recommendations  No PT follow up     Equipment Recommendations  None recommended by PT    Recommendations for Other Services       Precautions / Restrictions Precautions Precautions: Fall Restrictions Weight Bearing Restrictions: No    Mobility  Bed Mobility Overal bed mobility: Independent                Transfers Overall transfer level: Independent Equipment used: None Transfers: Sit to/from Stand Sit to Stand: Supervision;Modified independent (Device/Increase time)         General transfer comment: no physical assist. supervision for safety  Ambulation/Gait Ambulation/Gait assistance: Supervision Gait Distance (Feet): 800 Feet Assistive device: None Gait Pattern/deviations: Drifts right/left;Wide base of support     General Gait Details: wide BOS which appears to be baseline compensatory techniques. mild drifting however no overt LOB   Stairs             Wheelchair Mobility    Modified Rankin (Stroke Patients Only)       Balance Overall balance assessment: History of Falls   Sitting balance-Leahy Scale: Normal     Standing  balance support: During functional activity;No upper extremity supported Standing balance-Leahy Scale: Good               High level balance activites: Direction changes;Turns;Head turns High Level Balance Comments: no LOB with above, able to decr BOS with cues. conversational during amb, head turns/visual scanning, no LOB            Cognition Arousal/Alertness: Awake/alert Behavior During Therapy: WFL for tasks assessed/performed Overall Cognitive Status: Within Functional Limits for tasks assessed                                        Exercises      General Comments        Pertinent Vitals/Pain Pain Assessment: Faces Faces Pain Scale: Hurts a little bit Pain Location: UEs and cervical region  Pain Descriptors / Indicators: Discomfort;Tingling;Sore Pain Intervention(s): Limited activity within patient's tolerance;Monitored during session    Home Living                      Prior Function            PT Goals (current goals can now be found in the care plan section) Acute Rehab PT Goals Patient Stated Goal: see SW PT Goal Formulation: With patient Time For Goal Achievement: 07/10/20 Potential to Achieve Goals: Good Progress towards PT goals: Progressing toward goals    Frequency  Min 3X/week      PT Plan Current plan remains appropriate    Co-evaluation              AM-PAC PT "6 Clicks" Mobility   Outcome Measure  Help needed turning from your back to your side while in a flat bed without using bedrails?: None Help needed moving from lying on your back to sitting on the side of a flat bed without using bedrails?: None Help needed moving to and from a bed to a chair (including a wheelchair)?: None Help needed standing up from a chair using your arms (e.g., wheelchair or bedside chair)?: None Help needed to walk in hospital room?: None Help needed climbing 3-5 steps with a railing? : None 6 Click Score: 24     End of Session Equipment Utilized During Treatment: Gait belt Activity Tolerance: Patient tolerated treatment well Patient left: with call bell/phone within reach;Other (comment) (EOB, no alarm on arrival )   PT Visit Diagnosis: Difficulty in walking, not elsewhere classified (R26.2)     Time: 9030-0923 PT Time Calculation (min) (ACUTE ONLY): 14 min  Charges:  $Gait Training: 8-22 mins                     Delice Bison, PT  Acute Rehab Dept ([facility]) [phone] Pager [phone]  07/04/2020    [facility] 07/04/2020, 11:03 AM

## 2020-07-04 NOTE — Progress Notes (Signed)
CSW attempted to call shelters in and arounf [address], [place], [place] and [place] and could not get anyone to take the CSW's call.  CSW spoke with pt who stated he had a plan of going to the [facility] general area to wait until Monday so that eh can begin the steps/process of getting housed.  When asked his plans for shelter until Monday the pt states he will attempt to go to [facility] to seek shelter there although an opening is not guaranteed.  Pt stated he was familiar with all resources in the area.  Provider updated.  CSW will continue to follow for D/C needs.  Dorothe Pea. Koralyn Prestage  MSW, LCSW, LCAS, CCS Transitions of Care Clinical Social Worker Care Coordination Department Ph: [phone]

## 2020-07-05 LAB — BASIC METABOLIC PANEL
Anion gap: 8 (ref 5–15)
BUN: 11 mg/dL (ref 8–23)
CO2: 22 mmol/L (ref 22–32)
Calcium: 8.5 mg/dL — ABNORMAL LOW (ref 8.9–10.3)
Chloride: 105 mmol/L (ref 98–111)
Creatinine, Ser: 0.63 mg/dL (ref 0.61–1.24)
GFR, Estimated: >60 mL/min (ref >60)
Glucose, Bld: 95 mg/dL (ref 70–99)
Potassium: 3.9 mmol/L (ref 3.5–5.1)
Sodium: 135 mmol/L (ref 135–145)

## 2020-07-05 NOTE — Progress Notes (Signed)
PROGRESS NOTE    Cody NearingMichael G Faul  ZOX:[id number] DOB: 05/28/1956 DOA: 06/29/2020 PCP: Patient, No Pcp Per   Brief Narrative: Cody Glenn is a 64 y.o. male with history of alcohol and tobacco use disorder. Patient presented secondary to fall and head injury, found to have a nasal fracture in addition to significant hyponatremia. Started on IV fluids.   Assessment & Plan:   Principal Problem:   Hyponatremia Active Problems:   Alcohol use   Tobacco use   Malnutrition of moderate degree   Hyperbilirubinemia   Hypokalemia   Hyponatremia Likely chronic. Patient with a fall prior to admission. Sodium of 111 on admission. Initially given a NS bolus given on admission followed by NS at 50 ml/hr with rapid improvement of sodium to 120 within 6 hours. IV fluids discontinued. Sodium continuing to increase with increased dietary intake and is now resolved.  Nasal bone facture Left. Displaced medially. Recommendation for outpatient ENT follow-up  Fall Initially thought secondary to alcohol intoxication but possibly related to hyponatremia as well. OT recommending SNF but PT recommending no PT follow-up.  Alcohol abuse Continues to have low CIWA scores. -Continue CIWA  Tobacco use Cessation discussed -Continue nicotine patch  Bilateral neuropathic pain Patient states that this is somewhat new. CT cervical spine significant for moderate-severe C5-C6/C6-C7 stenosis. MRI significant for severe stenosis at C3-4 with moderate stenosis at C5-6 and widespread severe foraminal stenosis. 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  Elevated BP Started on clonidine -Continue clonidine  Hyperbilirubinemia Likely related alcohol use. AST of 43 in setting of alcohol use. RUQ ultrasound with no evidence of  cirrhosis  Hypokalemia -Supplementation   DVT prophylaxis: SCDs Code Status:   Code Status: Full Code Family Communication: None at bedside Disposition Plan: Discharge to shelter likely in 24 hours as there is no current safe discharge at this time.    Consultants:   Neurosurgery  Procedures:   None  Antimicrobials:  None    Subjective: No issues overnight. Still having numbness and weakness in his extremities. Feels like he had a skipped heart beat.  Objective: Vitals:   07/04/20 1320 07/04/20 1322 07/04/20 2145 07/05/20 0613  BP: (!) 152/94 (!) 139/95 (!) 156/92 (!) 144/98  Pulse: 78 75 66 (!) 55  Resp:  17 18 15   Temp: (!) 97.5 F (36.4 C) (!) 97.5 F (36.4 C) (!) 97.5 F (36.4 C) 97.8 F (36.6 C)  TempSrc:      SpO2: 100% 100% 100% 100%  Weight:      Height:        Intake/Output Summary (Last 24 hours) at 07/05/2020 0923 Last data filed at 07/05/2020 0731 Gross per 24 hour  Intake 1080 ml  Output --  Net 1080 ml   Filed Weights   06/29/20 2300  Weight: 61.3 kg    Examination:  General exam: Appears calm and comfortable HEENT: subconjunctival hemorrhage of right lateral eye Respiratory system: Clear to auscultation. Respiratory effort normal. Cardiovascular system: S1 & S2 heard, RRR. No murmurs, rubs, gallops or clicks. Gastrointestinal system: Abdomen is nondistended, soft and nontender. No organomegaly or masses felt. Normal bowel sounds heard. Central nervous system: Alert and oriented. Musculoskeletal: No edema. No calf tenderness Skin: No cyanosis. No rashes Psychiatry: Judgement and insight appear normal. Mood & affect appropriate.     Data Reviewed: I have personally reviewed following labs and imaging studies  CBC Lab Results  Component Value Date   WBC 6.3 07/02/2020   RBC 3.43 (L) 07/02/2020   HGB 12.0 (L) 07/02/2020   HCT 35.1 (L) 07/02/2020   MCV 102.3 (H) 07/02/2020   MCH 35.0 (H) 07/02/2020   PLT 187 07/02/2020    MCHC 34.2 07/02/2020   RDW 12.6 07/02/2020   LYMPHSABS 1.3 06/29/2020   MONOABS 1.0 06/29/2020   EOSABS 0.0 06/29/2020   BASOSABS 0.0 06/29/2020     Last metabolic panel Lab Results  Component Value Date   NA 135 07/05/2020   K 3.9 07/05/2020   CL 105 07/05/2020   CO2 22 07/05/2020   BUN 11 07/05/2020   CREATININE 0.63 07/05/2020   GLUCOSE 95 07/05/2020   GFRNONAA >60 07/05/2020   CALCIUM 8.5 (L) 07/05/2020   PHOS 3.3 07/01/2020   PROT 5.9 (L) 07/01/2020   ALBUMIN 3.2 (L) 07/01/2020   BILITOT 1.8 (H) 07/01/2020   ALKPHOS 72 07/01/2020   AST 43 (H) 07/01/2020   ALT 19 07/01/2020   ANIONGAP 8 07/05/2020    CBG (last 3)  No results for input(s): GLUCAP in the last 72 hours.   GFR: Estimated Creatinine Clearance: 81.9 mL/min (by C-G formula based on SCr of 0.63 mg/dL).  Coagulation Profile: No results for input(s): INR, PROTIME in the last 168 hours.  Recent Results (from the past 240 hour(s))  Resp Panel by RT-PCR (Flu A&B, Covid) Nasopharyngeal Swab     Status: None   Collection Time: 06/29/20  8:32 PM   Specimen: Nasopharyngeal Swab; Nasopharyngeal(NP) swabs in vial transport medium  Result Value Ref Range Status   SARS Coronavirus 2 by RT PCR NEGATIVE NEGATIVE Final    Comment: (NOTE) SARS-CoV-2 target nucleic acids are NOT DETECTED.  The SARS-CoV-2 RNA is generally detectable in upper respiratory specimens during the acute phase of infection. The lowest concentration of SARS-CoV-2 viral copies this assay can detect is 138 copies/mL. A negative result does not preclude SARS-Cov-2 infection and should not be used as the sole basis for treatment or other patient management decisions. A negative result may occur with  improper specimen collection/handling, submission of specimen other than nasopharyngeal swab, presence of viral mutation(s) within the areas targeted by this assay, and inadequate number of viral copies(<138 copies/mL). A negative result must be  combined with clinical observations, patient history, and epidemiological information. The expected result is Negative.  Fact Sheet for Patients:  BloggerCourse.com  Fact Sheet for Healthcare Providers:  SeriousBroker.it  This test is no t yet approved or cleared by the [place] FDA and  has been authorized for detection and/or diagnosis of SARS-CoV-2 by FDA under an Emergency Use Authorization (EUA). This EUA will remain  in effect (meaning this test can be used) for the duration of the COVID-19 declaration under Section 564(b)(1) of the Act, 21 U.S.C.section 360bbb-3(b)(1), unless the authorization is terminated  or revoked sooner.       Influenza A by PCR NEGATIVE NEGATIVE Final   Influenza B by PCR NEGATIVE NEGATIVE Final    Comment: (NOTE) The Xpert Xpress SARS-CoV-2/FLU/RSV plus assay is intended as an aid in the diagnosis of influenza from Nasopharyngeal swab specimens and should not be used as a sole basis for treatment. Nasal washings and aspirates are unacceptable for Xpert Xpress SARS-CoV-2/FLU/RSV testing.  Fact Sheet for Patients: BloggerCourse.com  Fact Sheet for Healthcare Providers: SeriousBroker.it  This test is not yet approved or cleared by the [place] FDA and has been authorized for detection  and/or diagnosis of SARS-CoV-2 by FDA under an Emergency Use Authorization (EUA). This EUA will remain in effect (meaning this test can be used) for the duration of the COVID-19 declaration under Section 564(b)(1) of the Act, 21 U.S.C. section 360bbb-3(b)(1), unless the authorization is terminated or revoked.  Performed at [facility], [address], [place], [place] [address]   MRSA PCR Screening     Status: None   Collection Time: 06/29/20 11:40 PM   Specimen: Nasopharyngeal  Result Value Ref Range Status   MRSA by PCR NEGATIVE  NEGATIVE Final    Comment:        The GeneXpert MRSA Assay (FDA approved for NASAL specimens only), is one component of a comprehensive MRSA colonization surveillance program. It is not intended to diagnose MRSA infection nor to guide or monitor treatment for MRSA infections. Performed at [facility], [address] [place], [place] [address]         Radiology Studies: MR CERVICAL SPINE WO CONTRAST  Result Date: 07/03/2020 CLINICAL DATA:  Fall.  Cervical spinal stenosis. EXAM: MRI CERVICAL SPINE WITHOUT CONTRAST TECHNIQUE: Multiplanar, multisequence MR imaging of the cervical spine was performed. No intravenous contrast was administered. COMPARISON:  Cervical spine CT 06/29/2020 FINDINGS: Alignment: Cervical spine straightening. Trace anterolisthesis of C4 on C5. Vertebrae: No fracture, suspicious osseous lesion, or significant marrow edema. Cord: Abnormal T2 hyperintensity in the spinal cord bilaterally at C3-4 and C5-6 most consistent with myelomalacia related to spondylosis and spinal stenosis at these levels. Posterior Fossa, vertebral arteries, paraspinal tissues: Unremarkable. Disc levels: The cervical spinal canal is small in caliber diffusely on a congenital basis. Disc space narrowing is moderate at C5-6 and severe at C6-7. C2-3: Mild disc bulging and asymmetric right uncovertebral spurring result in moderate right neural foraminal stenosis without spinal stenosis. C3-4: Disc bulging, a central disc protrusion, uncovertebral spurring, infolding of the ligamentum flavum, and mild right facet arthrosis result in severe spinal stenosis with moderate cord flattening and severe bilateral neural foraminal stenosis. C4-5: Mild disc bulging and mild uncovertebral spurring result in mild spinal stenosis and moderate bilateral neural foraminal stenosis. C5-6: Disc bulging and uncovertebral spurring result in moderate spinal stenosis with moderate cord flattening and severe  bilateral neural foraminal stenosis. C6-7: Right eccentric disc bulging and uncovertebral spurring result in mild spinal stenosis and severe bilateral neural foraminal stenosis. C7-T1: Disc bulging, uncovertebral spurring, and mild right and moderate left facet arthrosis result in severe right and moderate left neural foraminal stenosis without significant spinal stenosis. IMPRESSION: 1. 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 2. 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 3. Widespread severe neural foraminal stenosis. Electronically Signed   By: Sebastian Ache M.D.   On: 07/03/2020 19:34        Scheduled Meds: . cloNIDine  0.1 mg Oral BID  . cyanocobalamin  1,000 mcg Intramuscular Daily  . feeding supplement  237 mL Oral BID BM  . folic acid  1 mg Oral Daily  . gabapentin  300 mg Oral TID  . mouth rinse  15 mL Mouth Rinse BID  . multivitamin with minerals  1 tablet Oral Daily  . nicotine  21 mg Transdermal Daily  . thiamine  100 mg Oral Daily   Or  . thiamine  100 mg Intravenous Daily   Continuous Infusions:   LOS: 6 days     Jacquelin Hawking, MD [facility]  07/05/2020, 9:23 AM  If 7PM-7AM, please contact night-coverage [email or web address]

## 2020-07-06 NOTE — Discharge Instructions (Signed)
Cody Glenn,  You were in the hospital after a fall and found to have DKA as well as facial injury. You also were found to have cervical spine nerve disease. You need to follow-up with your PCP, an ENT physician and a neurosurgeon, as recommended.

## 2020-07-06 NOTE — Progress Notes (Signed)
Patient left before discharge instructions given. MD notified.

## 2020-07-06 NOTE — TOC Transition Note (Signed)
Transition of Care [facility]) - CM/SW Discharge Note   Patient Details  Name: Cody Glenn MRN: [id number] Date of Birth: [birth year]  [facility] CM/SW Contact:  Amada Jupiter, LCSW Phone Number: 07/06/2020, 12:14 PM   Clinical Narrative:    Per MD, pt requesting to speak with SW again.  I have followed up with him and reviewed what was discussed Friday and over the weekend.  Pt well aware of shelter resources, however, I did provide him a list and contact info for case worker at [employer], Watt Climes.  Instructed pt that he needs to use the bus passes provided and head either to [facility] or [employer] to get assistance.  MD aware.  No further needs.   Final next level of care: Homeless Shelter Barriers to Discharge: Barriers Resolved   Patient Goals and CMS Choice Patient states their goals for this hospitalization and ongoing recovery are:: to go home CMS Medicare.gov Compare Post Acute Care list provided to:: Patient Choice offered to / list presented to : NA  Discharge Placement                       Discharge Plan and Services In-house Referral: Financial Counselor Discharge Planning Services: CM Consult, Medication Assistance, Other - See comment (substance abuse resources)            DME Arranged: N/A DME Agency: NA       HH Arranged: NA HH Agency: NA        Social Determinants of Health (SDOH) Interventions     Readmission Risk Interventions No flowsheet data found.

## 2020-07-06 NOTE — Discharge Summary (Signed)
Physician Discharge Summary  ELICEO GLADU EAV:[id number] DOB: Aug 04, 1956 DOA: 06/29/2020  PCP: Patient, No Pcp Per  Admit date: 06/29/2020 Discharge date: 07/06/2020  Admitted From: Street Disposition: Street/shelter  Recommendations for Outpatient Follow-up:  1. Follow up with PCP in 1 week 2. Neurosugery/ENT follow-up 3. Please obtain BMP/CBC in one week 4. Please follow up on the following pending results: None  Home Health: None Equipment/Devices: None  Discharge Condition: Stable CODE STATUS: Full code Diet recommendation: Heart healthy   Brief/Interim Summary:  Admission HPI written by Charlsie Quest, MD   Chief Complaint: Fall  HPI: BRANCE DARTT is a 64 y.o. male with medical history significant for alcohol and tobacco use who presents to the ED for evaluation after a fall.  Patient states he was outside when he was walking along the sidewalk when he tripped over the curb.  He states fell forward onto his face.  He says he thinks he lost consciousness temporarily.  He says he has been feeling lightheaded all day.  He has been having nausea and vomiting and some shortness of breath.  He reports a chronic cough which is unchanged.  He has not had any chest pain, abdominal pain, dysuria, diarrhea, seizure activity, loss of bowel/bladder control.  Patient does report chronic alcohol use, at least 3 quarts of beer daily with unspecified amount of liquor as well.  He is a chronic smoker of 1 pack/day since age of 19.  He denies any marijuana, cocaine, or IV drug use.  He reports a history of bleeding stomach ulcer requiring surgery over 30 years ago.  He is unaware of any other chronic medical conditions.  He is not currently taking any medications.  [facility]  He is unaware of any medical conditions in his immediate family.   Hospital course:  Hyponatremia Likely chronic. Patient with a fall prior to admission.  Sodium of 111 on admission. Initially given a NS bolus given on admission followed by NS at 50 ml/hr with rapid improvement of sodium to 120 within 6 hours. IV fluids discontinued. Sodium continuing to increase with increased dietary intake and is now resolved.  Nasal bone facture Left. Displaced medially. Recommendation for outpatient ENT follow-up  Fall Initially thought secondary to alcohol intoxication but possibly related to hyponatremia as well. OT recommending SNF but PT recommending no PT follow-up.  Alcohol abuse Continues to have low CIWA scores.  Tobacco use Cessation discussed -Continue nicotine patch  Bilateral neuropathic pain Patient states that this is somewhat new. CT cervical spine significant for moderate-severe C5-C6/C6-C7 stenosis. MRI significant for severe stenosis at C3-4 with moderate stenosis at C5-6 and widespread severe foraminal stenosis. Patient with upper extremity weakness/numbness. Neurosurgery consulted and recommending cervical fusion surgery as an outpatient.  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  Elevated BP Started on clonidine. Patient left prior to receiving recommendations for prescriptions.  Hyperbilirubinemia Likely related alcohol use. AST of 43 in setting of alcohol use. RUQ ultrasound with no evidence of cirrhosis  Hypokalemia Supplementation  Discharge Diagnoses:  Principal Problem:   Hyponatremia Active Problems:   Alcohol use   Tobacco use   Malnutrition of moderate degree   Hyperbilirubinemia   Hypokalemia    Discharge Instructions   Allergies as of 07/06/2020   No Known Allergies     Medication List    You have not been prescribed any medications.     Follow-up Information  Donalee Citrin, MD. Schedule an appointment as soon as possible for a visit in 1 week(s).   Specialty: Neurosurgery Contact information: [address] [place] [address] [phone]          Arsenio Loader, [facility] Follow up.   Why: Ear/Nose/Throat doctor for nasal bone fracture Contact information: [address] [place] [address] [phone]              No Known Allergies  Consultations:  Neurosurgery   Procedures/Studies: CT Head Wo Contrast  Result Date: 06/29/2020 CLINICAL DATA:  Facial trauma. Additional history provided: Status post mechanical fall off curb, bilateral hip pain, head trauma. EXAM: CT HEAD WITHOUT CONTRAST CT MAXILLOFACIAL WITHOUT CONTRAST CT CERVICAL SPINE WITHOUT CONTRAST TECHNIQUE: Multidetector CT imaging of the head, cervical spine, and maxillofacial structures were performed using the standard protocol without intravenous contrast. Multiplanar CT image reconstructions of the cervical spine and maxillofacial structures were also generated. COMPARISON:  CT head/cervical spine 03/25/2014. FINDINGS: CT HEAD FINDINGS Brain: Mild generalized cerebral atrophy. Redemonstrated CSF density cystic lesion in the suprasellar region. This is unchanged in size as compared to the head CT of 03/25/2014, again measuring up to 3.6 cm in greatest single dimension. There is no acute intracranial hemorrhage. No demarcated cortical infarct. No midline shift. Vascular: No hyperdense vessel.  Atherosclerotic calcifications Skull: Normal. Negative for fracture or focal lesion. Other: Large right forehead/right periorbital hematoma. CT MAXILLOFACIAL FINDINGS Osseous: Medially displaced fracture of the left nasal bone, new as compared to the prior head CT of 03/25/2014, but otherwise age indeterminate. No other maxillofacial fracture is identified. Orbits: Prominent right periorbital hematoma. Otherwise, no acute finding. The globes are normal in size and contour. The extraocular muscles and optic nerve sheath complexes are symmetric and unremarkable. Sinuses: Trace scattered paranasal sinus mucosal thickening. No air-fluid levels. Soft tissues:  Prominent right forehead/periorbital hematoma. Other: S-shaped deviation of the bony nasal septum. Small nasal septal defect (series 9, image 22). CT CERVICAL SPINE FINDINGS Alignment: Straightening of the expected cervical lordosis. Trace C4-C5 grade 1 anterolisthesis. Skull base and vertebrae: The basion-dental and atlanto-dental intervals are maintained.No evidence of acute fracture to the cervical spine. Prominent multilevel ventral osteophytes. Soft tissues and spinal canal: No prevertebral fluid or swelling. No visible canal hematoma. Disc levels: Cervical spondylosis with multilevel disc space narrowing, disc bulges, posterior disc osteophytes, uncovertebral hypertrophy and facet arthrosis. There is moderate (and possibly severe) no canal stenosis at C5-C6 and C6-C7. Multilevel bony neural foraminal narrowing. Upper chest: Paraseptal emphysema.  No visible pneumothorax. IMPRESSION: CT head: 1. No acute posttraumatic intracranial findings. 2. Prominent right forehead/periorbital hematoma. 3. Cystic lesion within the suprasellar region measuring 3.6 cm in greatest single dimension, unchanged in size as compared to the head CT of 03/25/2014. Differential considerations include epidermoid, arachnoid cyst or subarachnoid adhesion. Nonemergent contrast-enhanced MRI is recommended if not already performed. 4. Mild cerebral atrophy. CT maxillofacial: 1. Medially displaced fracture of the left nasal bone, new as compared to the head CT of 03/25/2014 but otherwise age indeterminate. 2. No other maxillofacial fracture is identified. 3. Prominent right forehead/periorbital hematoma. 4. S-shaped deviation of the bony nasal septum. Small nasal septal defect. CT cervical spine: 1. No evidence of acute fracture to the cervical spine. 2. Mild chronic C4-C5 grade 1 anterolisthesis. 3. Cervical spondylosis as described. Suspect moderate (and possibly severe) spinal canal stenosis at C5-C6 and C6-C7 4.  Emphysema (ICD10-J43.9).  Electronically Signed   By: Jackey Loge  DO   On: 06/29/2020 20:48   CT Cervical Spine Wo Contrast  Result Date: 06/29/2020 CLINICAL DATA:  Facial trauma. Additional history provided: Status post mechanical fall off curb, bilateral hip pain, head trauma. EXAM: CT HEAD WITHOUT CONTRAST CT MAXILLOFACIAL WITHOUT CONTRAST CT CERVICAL SPINE WITHOUT CONTRAST TECHNIQUE: Multidetector CT imaging of the head, cervical spine, and maxillofacial structures were performed using the standard protocol without intravenous contrast. Multiplanar CT image reconstructions of the cervical spine and maxillofacial structures were also generated. COMPARISON:  CT head/cervical spine 03/25/2014. FINDINGS: CT HEAD FINDINGS Brain: Mild generalized cerebral atrophy. Redemonstrated CSF density cystic lesion in the suprasellar region. This is unchanged in size as compared to the head CT of 03/25/2014, again measuring up to 3.6 cm in greatest single dimension. There is no acute intracranial hemorrhage. No demarcated cortical infarct. No midline shift. Vascular: No hyperdense vessel.  Atherosclerotic calcifications Skull: Normal. Negative for fracture or focal lesion. Other: Large right forehead/right periorbital hematoma. CT MAXILLOFACIAL FINDINGS Osseous: Medially displaced fracture of the left nasal bone, new as compared to the prior head CT of 03/25/2014, but otherwise age indeterminate. No other maxillofacial fracture is identified. Orbits: Prominent right periorbital hematoma. Otherwise, no acute finding. The globes are normal in size and contour. The extraocular muscles and optic nerve sheath complexes are symmetric and unremarkable. Sinuses: Trace scattered paranasal sinus mucosal thickening. No air-fluid levels. Soft tissues: Prominent right forehead/periorbital hematoma. Other: S-shaped deviation of the bony nasal septum. Small nasal septal defect (series 9, image 22). CT CERVICAL SPINE FINDINGS Alignment: Straightening of the  expected cervical lordosis. Trace C4-C5 grade 1 anterolisthesis. Skull base and vertebrae: The basion-dental and atlanto-dental intervals are maintained.No evidence of acute fracture to the cervical spine. Prominent multilevel ventral osteophytes. Soft tissues and spinal canal: No prevertebral fluid or swelling. No visible canal hematoma. Disc levels: Cervical spondylosis with multilevel disc space narrowing, disc bulges, posterior disc osteophytes, uncovertebral hypertrophy and facet arthrosis. There is moderate (and possibly severe) no canal stenosis at C5-C6 and C6-C7. Multilevel bony neural foraminal narrowing. Upper chest: Paraseptal emphysema.  No visible pneumothorax. IMPRESSION: CT head: 1. No acute posttraumatic intracranial findings. 2. Prominent right forehead/periorbital hematoma. 3. Cystic lesion within the suprasellar region measuring 3.6 cm in greatest single dimension, unchanged in size as compared to the head CT of 03/25/2014. Differential considerations include epidermoid, arachnoid cyst or subarachnoid adhesion. Nonemergent contrast-enhanced MRI is recommended if not already performed. 4. Mild cerebral atrophy. CT maxillofacial: 1. Medially displaced fracture of the left nasal bone, new as compared to the head CT of 03/25/2014 but otherwise age indeterminate. 2. No other maxillofacial fracture is identified. 3. Prominent right forehead/periorbital hematoma. 4. S-shaped deviation of the bony nasal septum. Small nasal septal defect. CT cervical spine: 1. No evidence of acute fracture to the cervical spine. 2. Mild chronic C4-C5 grade 1 anterolisthesis. 3. Cervical spondylosis as described. Suspect moderate (and possibly severe) spinal canal stenosis at C5-C6 and C6-C7 4.  Emphysema (ICD10-J43.9). Electronically Signed   By: Jackey Loge DO   On: 06/29/2020 20:48   MR BRAIN WO CONTRAST  Result Date: 07/02/2020 CLINICAL DATA:  Suprasellar cyst.  Recent trauma. EXAM: MRI HEAD WITHOUT CONTRAST  TECHNIQUE: Multiplanar, multiecho pulse sequences of the brain and surrounding structures were obtained without intravenous contrast. COMPARISON:  CT of the head and face 06/29/2020 FINDINGS: Brain: Moderate generalized atrophy is present. 2.0 x 1.7 x 1.6 cm. Pituitary is otherwise normal. No restricted diffusion is present. Moderate generalized atrophy is present. Moderate white  matter changes are noted into the corona radiata bilaterally. The ventricles are proportionate to the degree of atrophy. Thinning of the corpus callosum is noted. No acute infarct, hemorrhage, or mass lesion is present. No significant extraaxial fluid collection is present. The internal auditory canals are within normal limits. The brainstem and cerebellum are within normal limits. Vascular: Flow is present in the major intracranial arteries. Skull and upper cervical spine: Mild degenerative changes are present in the upper cervical spine. Craniocervical junction is normal. Sinuses/Orbits: The paranasal sinuses and mastoid air cells are clear. The globes and orbits are within normal limits. Other: Right periorbital soft tissue swelling and nasal fractures are again noted. Please see CT for further detail of the acute trauma. IMPRESSION: 1. 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 2. No acute intracranial abnormality. 3. Moderate generalized atrophy and white matter disease likely reflects the sequela of chronic microvascular ischemia. 4. Right periorbital soft tissue swelling and nasal fractures are again noted. Please see CT for further detail of the acute acute. Electronically Signed   By: Marin Roberts M.D.   On: 07/02/2020 19:08   MR CERVICAL SPINE WO CONTRAST  Result Date: 07/03/2020 CLINICAL DATA:  Fall.  Cervical spinal stenosis. EXAM: MRI CERVICAL SPINE WITHOUT CONTRAST TECHNIQUE: Multiplanar, multisequence MR imaging of the cervical spine was performed. No intravenous contrast was administered.  COMPARISON:  Cervical spine CT 06/29/2020 FINDINGS: Alignment: Cervical spine straightening. Trace anterolisthesis of C4 on C5. Vertebrae: No fracture, suspicious osseous lesion, or significant marrow edema. Cord: Abnormal T2 hyperintensity in the spinal cord bilaterally at C3-4 and C5-6 most consistent with myelomalacia related to spondylosis and spinal stenosis at these levels. Posterior Fossa, vertebral arteries, paraspinal tissues: Unremarkable. Disc levels: The cervical spinal canal is small in caliber diffusely on a congenital basis. Disc space narrowing is moderate at C5-6 and severe at C6-7. C2-3: Mild disc bulging and asymmetric right uncovertebral spurring result in moderate right neural foraminal stenosis without spinal stenosis. C3-4: Disc bulging, a central disc protrusion, uncovertebral spurring, infolding of the ligamentum flavum, and mild right facet arthrosis result in severe spinal stenosis with moderate cord flattening and severe bilateral neural foraminal stenosis. C4-5: Mild disc bulging and mild uncovertebral spurring result in mild spinal stenosis and moderate bilateral neural foraminal stenosis. C5-6: Disc bulging and uncovertebral spurring result in moderate spinal stenosis with moderate cord flattening and severe bilateral neural foraminal stenosis. C6-7: Right eccentric disc bulging and uncovertebral spurring result in mild spinal stenosis and severe bilateral neural foraminal stenosis. C7-T1: Disc bulging, uncovertebral spurring, and mild right and moderate left facet arthrosis result in severe right and moderate left neural foraminal stenosis without significant spinal stenosis. IMPRESSION: 1. 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 2. 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 3. Widespread severe neural foraminal stenosis. Electronically Signed   By: Sebastian Ache M.D.   On: 07/03/2020  19:34   DG Hip Unilat With Pelvis 2-3 Views Left  Result Date: 06/29/2020 CLINICAL DATA:  Hip pain history of fall EXAM: DG HIP (WITH OR WITHOUT PELVIS) 2-3V LEFT COMPARISON:  None. FINDINGS: SI joints are non widened. The pubic symphysis and rami appear intact. No acute displaced fracture or malalignment. 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 Vascular calcifications. IMPRESSION: No acute osseous abnormality. Electronically Signed   By: Jasmine Pang M.D.   On: 06/29/2020 18:34   DG Hip Unilat  With Pelvis 2-3 Views Right  Result Date: 06/29/2020 CLINICAL DATA:  Hip pain after fall EXAM: DG HIP (WITH OR WITHOUT PELVIS) 2-3V RIGHT COMPARISON:  None. FINDINGS: No fracture or malalignment. Moderate arthritis of the right hip. Right pubic rami appear intact. IMPRESSION: No acute osseous abnormality. Electronically Signed   By: Jasmine Pang M.D.   On: 06/29/2020 18:32   CT Maxillofacial Wo Contrast  Result Date: 06/29/2020 CLINICAL DATA:  Facial trauma. Additional history provided: Status post mechanical fall off curb, bilateral hip pain, head trauma. EXAM: CT HEAD WITHOUT CONTRAST CT MAXILLOFACIAL WITHOUT CONTRAST CT CERVICAL SPINE WITHOUT CONTRAST TECHNIQUE: Multidetector CT imaging of the head, cervical spine, and maxillofacial structures were performed using the standard protocol without intravenous contrast. Multiplanar CT image reconstructions of the cervical spine and maxillofacial structures were also generated. COMPARISON:  CT head/cervical spine 03/25/2014. FINDINGS: CT HEAD FINDINGS Brain: Mild generalized cerebral atrophy. Redemonstrated CSF density cystic lesion in the suprasellar region. This is unchanged in size as compared to the head CT of 03/25/2014, again measuring up to 3.6 cm in greatest single dimension. There is no acute intracranial hemorrhage. No demarcated cortical infarct. No midline shift. Vascular: No hyperdense vessel.  Atherosclerotic calcifications Skull: Normal.  Negative for fracture or focal lesion. Other: Large right forehead/right periorbital hematoma. CT MAXILLOFACIAL FINDINGS Osseous: Medially displaced fracture of the left nasal bone, new as compared to the prior head CT of 03/25/2014, but otherwise age indeterminate. No other maxillofacial fracture is identified. Orbits: Prominent right periorbital hematoma. Otherwise, no acute finding. The globes are normal in size and contour. The extraocular muscles and optic nerve sheath complexes are symmetric and unremarkable. Sinuses: Trace scattered paranasal sinus mucosal thickening. No air-fluid levels. Soft tissues: Prominent right forehead/periorbital hematoma. Other: S-shaped deviation of the bony nasal septum. Small nasal septal defect (series 9, image 22). CT CERVICAL SPINE FINDINGS Alignment: Straightening of the expected cervical lordosis. Trace C4-C5 grade 1 anterolisthesis. Skull base and vertebrae: The basion-dental and atlanto-dental intervals are maintained.No evidence of acute fracture to the cervical spine. Prominent multilevel ventral osteophytes. Soft tissues and spinal canal: No prevertebral fluid or swelling. No visible canal hematoma. Disc levels: Cervical spondylosis with multilevel disc space narrowing, disc bulges, posterior disc osteophytes, uncovertebral hypertrophy and facet arthrosis. There is moderate (and possibly severe) no canal stenosis at C5-C6 and C6-C7. Multilevel bony neural foraminal narrowing. Upper chest: Paraseptal emphysema.  No visible pneumothorax. IMPRESSION: CT head: 1. No acute posttraumatic intracranial findings. 2. Prominent right forehead/periorbital hematoma. 3. Cystic lesion within the suprasellar region measuring 3.6 cm in greatest single dimension, unchanged in size as compared to the head CT of 03/25/2014. Differential considerations include epidermoid, arachnoid cyst or subarachnoid adhesion. Nonemergent contrast-enhanced MRI is recommended if not already performed. 4.  Mild cerebral atrophy. CT maxillofacial: 1. Medially displaced fracture of the left nasal bone, new as compared to the head CT of 03/25/2014 but otherwise age indeterminate. 2. No other maxillofacial fracture is identified. 3. Prominent right forehead/periorbital hematoma. 4. S-shaped deviation of the bony nasal septum. Small nasal septal defect. CT cervical spine: 1. No evidence of acute fracture to the cervical spine. 2. Mild chronic C4-C5 grade 1 anterolisthesis. 3. Cervical spondylosis as described. Suspect moderate (and possibly severe) spinal canal stenosis at C5-C6 and C6-C7 4.  Emphysema (ICD10-J43.9). Electronically Signed   By: Jackey Loge DO   On: 06/29/2020 20:48   US Abdomen Limited RUQ (LIVER/GB)  Result Date: 07/02/2020 CLINICAL DATA:  Elevated bilirubin EXAM: ULTRASOUND ABDOMEN LIMITED RIGHT UPPER QUADRANT COMPARISON:  None. FINDINGS:  Gallbladder: No gallstones or wall thickening visualized. No sonographic Murphy sign noted by sonographer. Common bile duct: Diameter: 2 mm Liver: No focal lesion identified. Within normal limits in parenchymal echogenicity. Portal vein is patent on color Doppler imaging with normal direction of blood flow towards the liver. Other: None. IMPRESSION: Unremarkable exam. Electronically Signed   By: Katherine Mantle M.D.   On: 07/02/2020 02:44      Subjective: No issues today.  Discharge Exam: Vitals:   07/06/20 0455 07/06/20 0800  BP: (!) 153/95 (!) 146/79  Pulse: (!) 59 75  Resp: 17 20  Temp: 98.6 F (37 C) 97.7 F (36.5 C)  SpO2: 100% 100%   Vitals:   07/05/20 1451 07/05/20 2028 07/06/20 0455 07/06/20 0800  BP: 137/87 (!) 135/97 (!) 153/95 (!) 146/79  Pulse: 72 81 (!) 59 75  Resp: 17 18 17 20   Temp:  98.7 F (37.1 C) 98.6 F (37 C) 97.7 F (36.5 C)  TempSrc:  Oral Oral Oral  SpO2: 100% 100% 100% 100%  Weight:      Height:        General: Pt is alert, awake, not in acute distress Cardiovascular: RRR, S1/S2 +, no rubs, no  gallops Respiratory: CTA bilaterally, no wheezing, no rhonchi Abdominal: Soft, NT, ND, bowel sounds + Extremities: no edema, no cyanosis    The results of significant diagnostics from this hospitalization (including imaging, microbiology, ancillary and laboratory) are listed below for reference.     Microbiology: Recent Results (from the past 240 hour(s))  Resp Panel by RT-PCR (Flu A&B, Covid) Nasopharyngeal Swab     Status: None   Collection Time: 06/29/20  8:32 PM   Specimen: Nasopharyngeal Swab; Nasopharyngeal(NP) swabs in vial transport medium  Result Value Ref Range Status   SARS Coronavirus 2 by RT PCR NEGATIVE NEGATIVE Final    Comment: (NOTE) SARS-CoV-2 target nucleic acids are NOT DETECTED.  The SARS-CoV-2 RNA is generally detectable in upper respiratory specimens during the acute phase of infection. The lowest concentration of SARS-CoV-2 viral copies this assay can detect is 138 copies/mL. A negative result does not preclude SARS-Cov-2 infection and should not be used as the sole basis for treatment or other patient management decisions. A negative result may occur with  improper specimen collection/handling, submission of specimen other than nasopharyngeal swab, presence of viral mutation(s) within the areas targeted by this assay, and inadequate number of viral copies(<138 copies/mL). A negative result must be combined with clinical observations, patient history, and epidemiological information. The expected result is Negative.  Fact Sheet for Patients:  BloggerCourse.com  Fact Sheet for Healthcare Providers:  SeriousBroker.it  This test is no t yet approved or cleared by the [place] FDA and  has been authorized for detection and/or diagnosis of SARS-CoV-2 by FDA under an Emergency Use Authorization (EUA). This EUA will remain  in effect (meaning this test can be used) for the duration of the COVID-19  declaration under Section 564(b)(1) of the Act, 21 U.S.C.section 360bbb-3(b)(1), unless the authorization is terminated  or revoked sooner.       Influenza A by PCR NEGATIVE NEGATIVE Final   Influenza B by PCR NEGATIVE NEGATIVE Final    Comment: (NOTE) The Xpert Xpress SARS-CoV-2/FLU/RSV plus assay is intended as an aid in the diagnosis of influenza from Nasopharyngeal swab specimens and should not be used as a sole basis for treatment. Nasal washings and aspirates are unacceptable for Xpert Xpress SARS-CoV-2/FLU/RSV testing.  Fact Sheet for Patients: BloggerCourse.com  Fact Sheet for Healthcare Providers: SeriousBroker.it  This test is not yet approved or cleared by the [place] FDA and has been authorized for detection and/or diagnosis of SARS-CoV-2 by FDA under an Emergency Use Authorization (EUA). This EUA will remain in effect (meaning this test can be used) for the duration of the COVID-19 declaration under Section 564(b)(1) of the Act, 21 U.S.C. section 360bbb-3(b)(1), unless the authorization is terminated or revoked.  Performed at [facility], [address], [place], [place] [address]   MRSA PCR Screening     Status: None   Collection Time: 06/29/20 11:40 PM   Specimen: Nasopharyngeal  Result Value Ref Range Status   MRSA by PCR NEGATIVE NEGATIVE Final    Comment:        The GeneXpert MRSA Assay (FDA approved for NASAL specimens only), is one component of a comprehensive MRSA colonization surveillance program. It is not intended to diagnose MRSA infection nor to guide or monitor treatment for MRSA infections. Performed at [facility], [address] [place], [place] [address]      Labs: BNP (last 3 results) No results for input(s): BNP in the last 8760 hours. Basic Metabolic Panel: Recent Labs  Lab 06/29/20 1903 06/29/20 1933 06/30/20 0136 06/30/20 0311  06/30/20 1006 07/01/20 0114 07/01/20 0721 07/01/20 1258 07/02/20 0245 07/05/20 0551  NA 111*  --    < > 120*  120*   < > 123*  123* 125* 129* 130* 135  K 4.6  --   --  4.7  --  3.4*  --   --  3.5 3.9  CL 79*  --   --  87*  --  93*  --   --  100 105  CO2 18*  --   --  21*  --  21*  --   --  22 22  GLUCOSE 74  --   --  76  --  112*  --   --  210* 95  BUN 8  --   --  8  --  12  --   --  10 11  CREATININE 0.46*  --   --  0.58*  --  0.54*  --   --  0.73 0.63  CALCIUM 8.5*  --   --  8.5*  --  8.4*  --   --  8.6* 8.5*  MG  --  1.6*  --  2.3  --  1.8  --   --   --   --   PHOS  --   --   --  3.4  --  3.3  --   --   --   --    < > = values in this interval not displayed.   Liver Function Tests: Recent Labs  Lab 06/29/20 1903 06/30/20 0311 06/30/20 1350 07/01/20 0114  AST 64* 59*  --  43*  ALT 24 24  --  19  ALKPHOS 81 78  --  72  BILITOT 2.9* 3.9* 3.5* 1.8*  PROT 7.1 6.8  --  5.9*  ALBUMIN 3.8 3.6  --  3.2*   No results for input(s): LIPASE, AMYLASE in the last 168 hours. No results for input(s): AMMONIA in the last 168 hours. CBC: Recent Labs  Lab 06/29/20 1903 06/30/20 1006 07/01/20 0114 07/02/20 0245  WBC 10.3 8.5 8.0 6.3  NEUTROABS 7.9*  --   --   --   HGB 13.2 13.1 12.1* 12.0*  HCT 35.5* 35.5* 33.3* 35.1*  MCV 92.4 94.4 96.2 102.3*  PLT 240 229 208 187   Cardiac Enzymes: Recent Labs  Lab 07/01/20 0114  CKTOTAL 240   BNP: Invalid input(s): POCBNP CBG: No results for input(s): GLUCAP in the last 168 hours. D-Dimer No results for input(s): DDIMER in the last 72 hours. Hgb A1c No results for input(s): HGBA1C in the last 72 hours. Lipid Profile No results for input(s): CHOL, HDL, LDLCALC, TRIG, CHOLHDL, LDLDIRECT in the last 72 hours. Thyroid function studies No results for input(s): TSH, T4TOTAL, T3FREE, THYROIDAB in the last 72 hours.  Invalid input(s): FREET3 Anemia work up No results for input(s): VITAMINB12, FOLATE, FERRITIN, TIBC, IRON, RETICCTPCT  in the last 72 hours. Urinalysis    Component Value Date/Time   COLORURINE AMBER (A) 06/29/2020 1933   APPEARANCEUR CLEAR 06/29/2020 1933   LABSPEC 1.020 06/29/2020 1933   PHURINE 6.0 06/29/2020 1933   GLUCOSEU NEGATIVE 06/29/2020 1933   HGBUR NEGATIVE 06/29/2020 1933   BILIRUBINUR NEGATIVE 06/29/2020 1933   KETONESUR 20 (A) 06/29/2020 1933   PROTEINUR 100 (A) 06/29/2020 1933   NITRITE NEGATIVE 06/29/2020 1933   LEUKOCYTESUR NEGATIVE 06/29/2020 1933   Sepsis Labs Invalid input(s): PROCALCITONIN,  WBC,  LACTICIDVEN Microbiology Recent Results (from the past 240 hour(s))  Resp Panel by RT-PCR (Flu A&B, Covid) Nasopharyngeal Swab     Status: None   Collection Time: 06/29/20  8:32 PM   Specimen: Nasopharyngeal Swab; Nasopharyngeal(NP) swabs in vial transport medium  Result Value Ref Range Status   SARS Coronavirus 2 by RT PCR NEGATIVE NEGATIVE Final    Comment: (NOTE) SARS-CoV-2 target nucleic acids are NOT DETECTED.  The SARS-CoV-2 RNA is generally detectable in upper respiratory specimens during the acute phase of infection. The lowest concentration of SARS-CoV-2 viral copies this assay can detect is 138 copies/mL. A negative result does not preclude SARS-Cov-2 infection and should not be used as the sole basis for treatment or other patient management decisions. A negative result may occur with  improper specimen collection/handling, submission of specimen other than nasopharyngeal swab, presence of viral mutation(s) within the areas targeted by this assay, and inadequate number of viral copies(<138 copies/mL). A negative result must be combined with clinical observations, patient history, and epidemiological information. The expected result is Negative.  Fact Sheet for Patients:  BloggerCourse.com  Fact Sheet for Healthcare Providers:  SeriousBroker.it  This test is no t yet approved or cleared by the [place] FDA  and  has been authorized for detection and/or diagnosis of SARS-CoV-2 by FDA under an Emergency Use Authorization (EUA). This EUA will remain  in effect (meaning this test can be used) for the duration of the COVID-19 declaration under Section 564(b)(1) of the Act, 21 U.S.C.section 360bbb-3(b)(1), unless the authorization is terminated  or revoked sooner.       Influenza A by PCR NEGATIVE NEGATIVE Final   Influenza B by PCR NEGATIVE NEGATIVE Final    Comment: (NOTE) The Xpert Xpress SARS-CoV-2/FLU/RSV plus assay is intended as an aid in the diagnosis of influenza from Nasopharyngeal swab specimens and should not be used as a sole basis for treatment. Nasal washings and aspirates are unacceptable for Xpert Xpress SARS-CoV-2/FLU/RSV testing.  Fact Sheet for Patients: BloggerCourse.com  Fact Sheet for Healthcare Providers: SeriousBroker.it  This test is not yet approved or cleared by the [place] FDA and has been authorized for detection and/or diagnosis of SARS-CoV-2 by FDA under an Emergency Use Authorization (EUA). This EUA will remain  in effect (meaning this test can be used) for the duration of the COVID-19 declaration under Section 564(b)(1) of the Act, 21 U.S.C. section 360bbb-3(b)(1), unless the authorization is terminated or revoked.  Performed at [facility], [address] [place], [place] [address]   MRSA PCR Screening     Status: None   Collection Time: 06/29/20 11:40 PM   Specimen: Nasopharyngeal  Result Value Ref Range Status   MRSA by PCR NEGATIVE NEGATIVE Final    Comment:        The GeneXpert MRSA Assay (FDA approved for NASAL specimens only), is one component of a comprehensive MRSA colonization surveillance program. It is not intended to diagnose MRSA infection nor to guide or monitor treatment for MRSA infections. Performed at [facility], [address] [place], [place] [address]      Time coordinating discharge: 35 minutes  SIGNED:   Jacquelin Hawkingalph Mairin Lindsley, MD [facility] 07/06/2020, 11:51 AM

## 2020-07-21 ENCOUNTER — Emergency Department (HOSPITAL_COMMUNITY): Payer: Self-pay

## 2020-07-21 ENCOUNTER — Emergency Department (HOSPITAL_COMMUNITY)
Admission: EM | Admit: 2020-07-21 | Discharge: 2020-07-21 | Disposition: A | Discharge status: Home / Self Care | Payer: Self-pay | Attending: Emergency Medicine | Admitting: Emergency Medicine

## 2020-07-21 ENCOUNTER — Other Ambulatory Visit: Payer: Self-pay

## 2020-07-21 ENCOUNTER — Encounter (HOSPITAL_COMMUNITY): Payer: Self-pay

## 2020-07-21 DIAGNOSIS — S022XXA Fracture of nasal bones, initial encounter for closed fracture: Secondary | ICD-10-CM | POA: Insufficient documentation

## 2020-07-21 DIAGNOSIS — W208XXA Other cause of strike by thrown, projected or falling object, initial encounter: Secondary | ICD-10-CM | POA: Insufficient documentation

## 2020-07-21 DIAGNOSIS — M79621 Pain in right upper arm: Secondary | ICD-10-CM | POA: Insufficient documentation

## 2020-07-21 DIAGNOSIS — W19XXXA Unspecified fall, initial encounter: Secondary | ICD-10-CM

## 2020-07-21 DIAGNOSIS — F17210 Nicotine dependence, cigarettes, uncomplicated: Secondary | ICD-10-CM | POA: Insufficient documentation

## 2020-07-21 DIAGNOSIS — S61412A Laceration without foreign body of left hand, initial encounter: Secondary | ICD-10-CM | POA: Insufficient documentation

## 2020-07-21 LAB — I-STAT CHEM 8, ED
BUN: 14 mg/dL (ref 8–23)
Calcium, Ion: 1.19 mmol/L (ref 1.15–1.40)
Chloride: 107 mmol/L (ref 98–111)
Creatinine, Ser: 0.8 mg/dL (ref 0.61–1.24)
Glucose, Bld: 92 mg/dL (ref 70–99)
HCT: 35 % — ABNORMAL LOW (ref 39.0–52.0)
Hemoglobin: 11.9 g/dL — ABNORMAL LOW (ref 13.0–17.0)
Potassium: 4.1 mmol/L (ref 3.5–5.1)
Sodium: 141 mmol/L (ref 135–145)
TCO2: 23 mmol/L (ref 22–32)

## 2020-07-21 MED ORDER — SALINE SPRAY 0.65 % NA SOLN
1.0000 | Freq: Once | NASAL | Status: DC
  Filled 2020-07-21: qty 44

## 2020-07-21 MED ORDER — BACITRACIN ZINC 500 UNIT/GM EX OINT: 1.0000 "application " | TOPICAL_OINTMENT | Freq: Two times a day (BID) | CUTANEOUS | Status: DC

## 2020-07-21 NOTE — ED Triage Notes (Signed)
Pt BIB EMS from [facility]. Pt was picking up trash and fell and hit his head. Pt now endorses head pain. Pt has lac to left hand that is bandaged. Pt denies LOC and blood thinners.   138/64 88 20 98% RA CBG 105

## 2020-07-21 NOTE — Discharge Instructions (Addendum)
As discussed, today's evaluation has been generally reassuring.  Your wound closure adhesive on your hand laceration will fall off when the time is appropriate.  It is important you follow-up with our ENT colleagues for appropriate ongoing management of your nasal fracture.  Please use the provided saline nasal spray twice daily into both nostrils to facilitate healing.  Return here for concerning changes in your condition.

## 2020-07-21 NOTE — ED Provider Notes (Signed)
[facility]-EMERGENCY DEPT Provider Note   CSN: [id number] Arrival date & time: 07/21/20  1635     History Chief Complaint  Patient presents with  . Fall    Cody Glenn is a 64 y.o. male.  HPI Patient presents after a fall.  Patient has a history of alcohol abuse, hyponatremia, and was admitted to this facility last month.  He notes that he was discharged about 10 days ago.  He has not had alcohol since that time.  He is a resident of a local transitional housing facility.  Today, he had a mechanical fall, falling forwards.  He struck his face, caught much of his weight with both of his hands.  There is no loss of consciousness, and he was almost immediately back to full interactivity.  However, since that time he has had pain in his face, left hand, both biceps.  No weakness anywhere, no loss of sensation anywhere, no confusion, no vision changes. Pain is sore, moderate, most prominent in the face. There is associated nose deformity, and bleeding from both nostrils.     Past Medical History:  Diagnosis Date  . Alcohol use     Patient Active Problem List   Diagnosis Date Noted  . Hyperbilirubinemia 07/01/2020  . Hypokalemia 07/01/2020  . Malnutrition of moderate degree 06/30/2020  . Hyponatremia 06/29/2020  . Alcohol use 06/29/2020  . Tobacco use 06/29/2020    Past Surgical History:  Procedure Laterality Date  . ulcer surgery in stomach     pt states he had this approx 33 years ago       History reviewed. No pertinent family history.  Social History   Tobacco Use  . Smoking status: Current Every Day Smoker    Packs/day: 1.00    Years: 40.00    Pack years: 40.00    Types: Cigarettes  . Smokeless tobacco: Never Used  Vaping Use  . Vaping Use: Never used  Substance Use Topics  . Alcohol use: Yes    Comment: "lots" on 06/29/20  . Drug use: Not Currently    Home Medications Prior to Admission medications   Not on File     Allergies    Patient has no known allergies.  Review of Systems   Review of Systems  Constitutional:       Per HPI, otherwise negative  HENT:       Per HPI, otherwise negative  Respiratory:       Per HPI, otherwise negative  Cardiovascular:       Per HPI, otherwise negative  Gastrointestinal: Negative for vomiting.  Endocrine:       Negative aside from HPI  Genitourinary:       Neg aside from HPI   Musculoskeletal:       Per HPI, otherwise negative  Skin: Positive for wound.  Neurological: Negative for syncope, weakness and numbness.  Psychiatric/Behavioral:       Per HPI, prior alcohol abuse    Physical Exam Updated Vital Signs BP (!) 149/88   Pulse 73   Temp 98 F (36.7 C) (Oral)   Resp 18   SpO2 100%   Physical Exam Vitals and nursing note reviewed.  Constitutional:      General: He is not in acute distress.    Appearance: He is well-developed.  HENT:     Head: Normocephalic.   Eyes:     Extraocular Movements: EOM normal.     Conjunctiva/sclera: Conjunctivae normal.  Cardiovascular:  Rate and Rhythm: Normal rate and regular rhythm.  Pulmonary:     Effort: Pulmonary effort is normal. No respiratory distress.     Breath sounds: No stridor.  Abdominal:     General: There is no distension.  Musculoskeletal:        General: No edema.     Left hand: Laceration, tenderness and bony tenderness present.       Arms:     Cervical back: Full passive range of motion without pain and neck supple. No spinous process tenderness or muscular tenderness.     Comments: Pain in both biceps, no limit of range of motion, no strength with biceps flexion.  Patient has unremarkable shoulder, elbow, wrist exam bilaterally.  Skin:    General: Skin is warm and dry.  Neurological:     Mental Status: He is alert and oriented to person, place, and time.  Psychiatric:        Mood and Affect: Mood and affect normal.     ED Results / Procedures / Treatments   Labs (all  labs ordered are listed, but only abnormal results are displayed) Labs Reviewed  I-STAT CHEM 8, ED - Abnormal; Notable for the following components:      Result Value   Hemoglobin 11.9 (*)    HCT 35.0 (*)    All other components within normal limits    EKG None  Radiology DG Hand Complete Left  Result Date: 07/21/2020 CLINICAL DATA:  Fall, left hand pain EXAM: LEFT HAND - COMPLETE 3+ VIEW COMPARISON:  None. FINDINGS: Three view radiograph of the a left hand demonstrates normal alignment. There is a remote appearing fracture deformity of the base of the left fifth metacarpal. No acute fracture or dislocation. Remote appearing fracture of an enthesophyte involving the terminal extensor tendon of the long finger. Moderate soft tissue swelling noted dorsal to the metacarpals on lateral examination. IMPRESSION: Soft tissue swelling. No acute fracture or dislocation. Multiple remote fractures involving the base of the left fifth metacarpal and an enthesophyte involving the terminal extensor tendon of the long finger. Electronically Signed   By: Helyn Numbers MD   On: 07/21/2020 19:15   CT Maxillofacial WO CM  Result Date: 07/21/2020 CLINICAL DATA:  Facial trauma. EXAM: CT MAXILLOFACIAL WITHOUT CONTRAST TECHNIQUE: Multidetector CT imaging of the maxillofacial structures was performed. Multiplanar CT image reconstructions were also generated. COMPARISON:  None. FINDINGS: Osseous: Depressed and comminuted left nasal bone fracture. The anterior aspect of the bony nasal septum is also fractured. The anterior walls of the maxillary sinus are intact. No zygoma fracture. The mandibular condyles are normally located. No mandible fracture. Orbits: No orbital wall fractures are identified. The globes are intact. Sinuses: The paranasal sinuses and mastoid air cells are clear. Soft tissues: No significant soft tissue abnormalities. Limited intracranial: Age advanced cerebral atrophy and ventriculomegaly,  unchanged since prior brain studies. No acute intracranial findings. IMPRESSION: 1. Depressed and comminuted left nasal bone fracture and fracture of the anterior aspect of the bony nasal septum. 2. No other facial bone fractures are identified. 3. Age advanced cerebral atrophy and ventriculomegaly, unchanged since prior brain studies. Electronically Signed   By: Rudie Meyer M.D.   On: 07/21/2020 19:22    Procedures Procedures (including critical care time) LACERATION REPAIR Performed by: Gerhard Munch Authorized by: Gerhard Munch Consent: Verbal consent obtained. Risks and benefits: risks, benefits and alternatives were discussed Consent given by: patient Patient identity confirmed: provided demographic data Prepped and Draped in normal  sterile fashion Wound explored  Laceration Location: L hand  Laceration Length: 4cm  No Foreign Bodies seen or palpated  Irrigation method: syringe Amount of cleaning: standard  Skin closure: dermabond  Number of one tube  Technique: close approx  Patient tolerance: Patient tolerated the procedure well with no immediate complications.  Medications Ordered in ED Medications  bacitracin ointment 1 application (has no administration in time range)    ED Course  I have reviewed the triage vital signs and the nursing notes.  Pertinent labs & imaging results that were available during my care of the patient were reviewed by me and considered in my medical decision making (see chart for details).   Chart review notable for critically abnormal sodium value last month requiring admission for repletion.  Update: Sodium value here unremarkable, as are his other labs. MDM Rules/Calculators/A&P                          8:31 PM On repeat exam the patient is in no distress, is awake, alert. Wound has been cleaned, now he has tolerated repair with Dermabond. We reviewed the x-rays, CT scans and I reviewed the myself, agree with  interpretation. Given evidence for nasal bone fracture, patient will start nasal saline, will follow up with ENT as an outpatient.  With otherwise reassuring findings, patient is appropriate for discharge with outpatient follow-up. Final Clinical Impression(s) / ED Diagnoses Final diagnoses:  Fall, initial encounter  Closed fracture of nasal bone, initial encounter  Laceration of left hand without foreign body, initial encounter     Gerhard Munch, MD 07/21/20 2034

## 2020-07-22 ENCOUNTER — Other Ambulatory Visit: Payer: Self-pay | Admitting: Critical Care Medicine

## 2020-07-22 ENCOUNTER — Encounter: Payer: Self-pay | Admitting: Critical Care Medicine

## 2020-07-22 MED ORDER — THIAMINE HCL 100 MG PO TABS: 100.0000 mg | ORAL_TABLET | Freq: Every day | ORAL | 2 refills | Status: DC

## 2020-07-22 MED ORDER — FOLIC ACID 1 MG PO TABS: 1.0000 mg | ORAL_TABLET | Freq: Every day | ORAL | 1 refills | Status: DC

## 2020-07-22 MED FILL — FOLIC ACID 1 MG TABS: 1 | 30 days supply | Qty: 30 | Fill #0

## 2020-07-22 MED FILL — VITAMIN B-1 100 MG TABS: 100 | 30 days supply | Qty: 100 | Fill #0

## 2020-07-22 NOTE — Progress Notes (Signed)
Patient ID: Cody Glenn, male   DOB: 01/02/1956, 64 y.o.   MRN: [id number] This is a 64 year old male with history of heavy alcohol use seen at the [facility].  He has been at the shelter for 1 week.  He has been drinking a quart and a half a day of beer and also actively smokes.  Prior to being in the shelter he was living on the street.  This patient's had several episodes of falling and note he was previously admitted with a significant fall and severe intoxication November 22.  At that time he was found to have significant cervical stenosis and was to have follow-up visit with neurosurgery but this appointment did not appear to occur.   Below is a copy of the discharge summary from that visit  Admit date: 06/29/2020 Discharge date: 07/06/2020  Admitted From: Street Disposition: Street/shelter  Recommendations for Outpatient Follow-up:  1. Follow up with PCP in 1 week 2. Neurosugery/ENT follow-up 3. Please obtain BMP/CBC in one week 4. Please follow up on the following pending results: None  Home Health: None Equipment/Devices: None  Discharge Condition: Stable CODE STATUS: Full code Diet recommendation: Heart healthy   Brief/Interim Summary:  Admission HPI written by Charlsie Quest, MD   Chief Complaint:Fall  HPI: Cody Glenn a 64 y.o.malewith medical history significant foralcohol and tobacco use who presents to the ED for evaluation after a fall.Patient states he was outside when he was walking along the sidewalk when he tripped over the curb. He states fell forward onto his face. He says he thinks he lost consciousness temporarily. He says he has been feeling lightheaded all day. He has been having nausea and vomiting and some shortness of breath. He reports a chronic cough which is unchanged. He has not had any chest pain, abdominal pain, dysuria, diarrhea, seizure activity, loss of bowel/bladder control.  Patient does report  chronic alcohol use, at least 3 quarts of beer daily with unspecified amount of liquor as well. He is a chronic smoker of 1 pack/day since age of 29. He denies any marijuana, cocaine, or IV drug use. He reports a history of bleeding stomach ulcer requiring surgery over 30 years ago. He is unaware of any other chronic medical conditions. He is not currently taking any medications. [facility] He is unaware of any medical conditions in his immediate family.   Hospital course:  Hyponatremia Likely chronic. Patient with a fall prior to admission. Sodium of 111 on admission. Initially given a NS bolus given on admission followed by NS at 50 ml/hr with rapid improvement of sodium to 120 within 6 hours. IV fluids discontinued. Sodium continuing to increase with increased dietary intake and isnow resolved.  Nasal bone facture Left. Displaced medially. Recommendation for outpatient ENT follow-up  Fall Initially thought secondary to alcohol intoxication but possibly related to hyponatremia as well. OT recommending SNF but PT recommending no PT follow-up.  Alcohol abuse Continues to have low CIWA scores.  Tobacco use Cessation discussed -Continue nicotine patch  Bilateral neuropathic pain Patient states that this is somewhat new. CT cervical spine significant for moderate-severe C5-C6/C6-C7 stenosis. MRI significant for severe stenosis at C3-4 with moderate stenosis at C5-6 and widespread severe foraminal stenosis. Patient with upper extremity weakness/numbness. Neurosurgery consulted and recommending cervical fusion surgery as an outpatient.  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  Elevated BP Started on clonidine. Patient left prior  to receiving recommendations for prescriptions.  Hyperbilirubinemia Likely related alcohol use. AST of 43 in setting of alcohol use. RUQ ultrasound with no evidence of  cirrhosis  Hypokalemia Supplementation  Discharge Diagnoses:  Principal Problem:   Hyponatremia Active Problems:   Alcohol use   Tobacco use   Malnutrition of moderate degree   Hyperbilirubinemia   Hypokalemia  This patient had another fall yesterday and was sent to the emergency room where he was evaluated the CT scan of the head and facial CT scan showing nasal fracture anteriorly but no bleeding in the brain.  Note his sodium at that visit was 141 and had resolved in terms of hyponatremia.  He has had one episode of nausea and vomiting today.  He states he does have a slight degree of headache in the right ear area.  He also complains of weakness in both shoulders and pain in both shoulders and he has very unsteady gait.  He has no abdominal pain or shortness of breath.  On exam blood pressure is 136/76 pulse 94 saturation 100% room air chest was clear cardiac exam unremarkable abdomen benign the patient has a very unsteady gait.  Impression is that of cervical stenosis which needs neurosurgical orthopedic spine evaluation ASAP #2 diarrhea status post heavy alcohol use #3 hyponatremia which is resolved  Plan for this patient is received thiamine folic acid multivitamin supplementation #2 we will get a referral to neurosurgery ASAP #3 we will obtain a walker for the patient #4 get this patient access to a [place] clinic

## 2020-08-23 NOTE — Progress Notes (Signed)
Subjective:    Patient ID: Cody Glenn, male    DOB: 10-22-1955, 65 y.o.   MRN: [id number] Virtual Visit via Telephone Note  I connected with Cody Glenn on 08/24/20 at  2:30 PM EST by telephone and verified that I am speaking with the correct person using two identifiers.   Consent:  I discussed the limitations, risks, security and privacy concerns of performing an evaluation and management service by telephone and the availability of in person appointments. I also discussed with the patient that there may be a patient responsible charge related to this service. The patient expressed understanding and agreed to proceed.  Location of patient: Patient is at home  Location of provider: I am in my office  Persons participating in the televisit with the patient.   No one else on the call    History of Present Illness: 65 y.o.M here to est PCP  Last seen 07/22/20 at [facility]: This is a 65 year old male with history of heavy alcohol use seen at the [place] [facility] clinic.  He has been at the shelter for 1 week.  He has been drinking a quart and a half a day of beer and also actively smokes.  Prior to being in the shelter he was living on the street.  This patient's had several episodes of falling and note he was previously admitted with a significant fall and severe intoxication November 22.  At that time he was found to have significant cervical stenosis and was to have follow-up visit with neurosurgery but this appointment did not appear to occur.   Below is a copy of the discharge summary from that visit  Admit date:06/29/2020 Discharge date:07/06/2020  Admitted From:Street Disposition:Street/shelter  Recommendations for Outpatient Follow-up: 1. Follow up with PCP in 1 week 2. Neurosugery/ENT follow-up 3. Please obtain BMP/CBC in one week 4. Please follow up on the following pending results:None  Home  Health:None Equipment/Devices:None  Discharge Condition:Stable CODE STATUS:Full code Diet recommendation:Heart healthy  Brief/Interim Summary:  Admission HPI written byVishal Delia Chimes, MD   Chief Complaint:Fall  HPI: Cody Levario Hollandis a 65 y.o.malewith medical history significant foralcohol and tobacco use who presents to the ED for evaluation after a fall.Patient states he was outside when he was walking along the sidewalk when he tripped over the curb. He states fell forward onto his face. He says he thinks he lost consciousness temporarily. He says he has been feeling lightheaded all day. He has been having nausea and vomiting and some shortness of breath. He reports a chronic cough which is unchanged. He has not had any chest pain, abdominal pain, dysuria, diarrhea, seizure activity, loss of bowel/bladder control.  Patient does report chronic alcohol use, at least 3 quarts of beer daily with unspecified amount of liquor as well. He is a chronic smoker of 1 pack/day since age of 60. He denies any marijuana, cocaine, or IV drug use. He reports a history of bleeding stomach ulcer requiring surgery over 30 years ago. He is unaware of any other chronic medical conditions. He is not currently taking any medications. [facility] He is unaware of any medical conditions in his immediate family.   Hospital course:  Hyponatremia Likely chronic. Patient with a fall prior to admission. Sodium of 111 on admission. Initially given a NS bolus given on admission followed by NS at 50 ml/hr with rapid improvement of sodium to 120 within 6  hours. IV fluids discontinued. Sodium continuing to increase with increased dietary intake and isnow resolved.  Nasal bone facture Left. Displaced medially. Recommendation for outpatient ENT follow-up  Fall Initially thought secondary to alcohol intoxication but possibly related to  hyponatremia as well. OT recommending SNF but PT recommending no PT follow-up.  Alcohol abuse Continues to have low CIWA scores.  Tobacco use Cessation discussed -Continue nicotine patch  Bilateral neuropathic pain Patient states that this is somewhat new. CT cervical spine significant for moderate-severe C5-C6/C6-C7 stenosis. MRI significant for severe stenosis at C3-4 with moderate stenosis at C5-6 and widespread severe foraminal stenosis. Patient with upper extremity weakness/numbness.Neurosurgery consulted and recommending cervical fusion surgery as an outpatient.  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  Elevated BP Started on clonidine. Patient left prior to receiving recommendations for prescriptions.  Hyperbilirubinemia Likely related alcohol use. AST of 43 in setting of alcohol use. RUQ ultrasound with no evidence of cirrhosis  Hypokalemia Supplementation  08/24/2020 Telehealth Visit:  Discharge Diagnoses: Principal Problem: Hyponatremia Active Problems: Alcohol use Tobacco use Malnutrition of moderate degree Hyperbilirubinemia Hypokalemia  This patient had another fall yesterday and was sent to the emergency room where he was evaluated the CT scan of the head and facial CT scan showing nasal fracture anteriorly but no bleeding in the brain.  Note his sodium at that visit was 141 and had resolved in terms of hyponatremia.  He has had one episode of nausea and vomiting today.  He states he does have a slight degree of headache in the right ear area.  He also complains of weakness in both shoulders and pain in both shoulders and he has very unsteady gait.  He has no abdominal pain or shortness of breath.  On exam blood pressure is 136/76 pulse 94 saturation 100% room air chest was clear cardiac exam unremarkable abdomen benign the patient has a very unsteady gait.  Impression is that of cervical stenosis which needs neurosurgical  orthopedic spine evaluation ASAP #2 diarrhea status post heavy alcohol use #3 hyponatremia which is resolved  Plan for this patient is received thiamine folic acid multivitamin supplementation #2 we will get a referral to neurosurgery ASAP #3 we will obtain a walker for the patient #4 get this patient access to a [place] clinic  08/24/2020      Past Medical History:  Diagnosis Date  . Alcohol use   . Hyperbilirubinemia 07/01/2020     History reviewed. No pertinent family history.   Social History   Socioeconomic History  . Marital status: Single    Spouse name: Not on file  . Number of children: Not on file  . Years of education: Not on file  . Highest education level: Not on file  Occupational History  . Not on file  Tobacco Use  . Smoking status: Current Every Day Smoker    Packs/day: 1.00    Years: 40.00    Pack years: 40.00    Types: Cigarettes  . Smokeless tobacco: Never Used  Vaping Use  . Vaping Use: Never used  Substance and Sexual Activity  . Alcohol use: Yes    Comment: "lots" on 06/29/20  . Drug use: Not Currently  . Sexual activity: Not on file  Other Topics Concern  . Not on file  Social History Narrative  . Not on file   Social Determinants of Health   Financial Resource Strain: Not on file  Food Insecurity: Not on file  Transportation Needs: Not on file  Physical Activity: Not on file  Stress: Not on file  Social Connections: Not on file  Intimate Partner Violence: Not on file     No Known Allergies   Outpatient Medications Prior to Visit  Medication Sig Dispense Refill  . folic acid (FOLVITE) 1 MG tablet Take 1 tablet (1 mg total) by mouth daily. 60 tablet 1  . thiamine 100 MG tablet Take 1 tablet (100 mg total) by mouth daily. 60 tablet 2   No facility-administered medications prior to visit.    Review of Systems  HENT: Negative.   Respiratory: Negative.   Cardiovascular: Negative.   Gastrointestinal: Negative.    Genitourinary: Negative.   Musculoskeletal: Positive for back pain, gait problem and neck pain.       Objective:   Physical Exam No exam, phone visit       Assessment & Plan:  I personally reviewed all images and lab data in the [facility] system as well as any outside material available during this office visit and agree with the  radiology impressions.   Cervical stenosis of spine Significant cervical stenosis between C4-5 and C5-6 on MRI of neck  Patient will need cervical spine fusion and laminectomy  Will make urgent referral to orthopedic spine  Patient to apply for [place] discount orange card and blue card to assist with finances as he is self-pay  I have supplied the patient with a walker to assist him in his ambulation  Alcohol use Ongoing daily alcohol use  Patient is down to 1 beer a day have advised this patient to reduce to 0 of alcohol  Patient to continue his vitamin supplementations   Diagnoses and all orders for this visit:  Cervical stenosis of spine -     Ambulatory referral to Orthopedic Surgery  Alcohol use    Follow Up Instructions:   The patient knows an office exam will occur within a month and we will get him referred to orthopedic spine I discussed the assessment and treatment plan with the patient. The patient was provided an opportunity to ask questions and all were answered. The patient agreed with the plan and demonstrated an understanding of the instructions.   The patient was advised to call back or seek an in-person evaluation if the symptoms worsen or if the condition fails to improve as anticipated.  I provided 30 minutes of non-face-to-face time during this encounter  including  median intraservice time , review of notes, labs, imaging, medications  and explaining diagnosis and management to the patient .    Shan Levans, MD

## 2020-08-24 ENCOUNTER — Ambulatory Visit: Payer: Self-pay | Attending: Critical Care Medicine | Admitting: Critical Care Medicine

## 2020-08-24 ENCOUNTER — Encounter: Payer: Self-pay | Admitting: Critical Care Medicine

## 2020-08-24 ENCOUNTER — Other Ambulatory Visit: Payer: Self-pay

## 2020-08-24 DIAGNOSIS — Z789 Other specified health status: Secondary | ICD-10-CM

## 2020-08-24 DIAGNOSIS — M4802 Spinal stenosis, cervical region: Secondary | ICD-10-CM

## 2020-08-24 DIAGNOSIS — Z7289 Other problems related to lifestyle: Secondary | ICD-10-CM

## 2020-08-24 NOTE — Progress Notes (Signed)
Uses a walker after fall incident and needs more bed rest for the shelter.

## 2020-08-24 NOTE — Assessment & Plan Note (Signed)
Significant cervical stenosis between C4-5 and C5-6 on MRI of neck  Patient will need cervical spine fusion and laminectomy  Will make urgent referral to orthopedic spine  Patient to apply for [place] discount orange card and blue card to assist with finances as he is self-pay  I have supplied the patient with a walker to assist him in his ambulation

## 2020-08-24 NOTE — Assessment & Plan Note (Signed)
Ongoing daily alcohol use  Patient is down to 1 beer a day have advised this patient to reduce to 0 of alcohol  Patient to continue his vitamin supplementations

## 2020-08-26 ENCOUNTER — Encounter: Payer: Self-pay | Admitting: Critical Care Medicine

## 2020-08-26 NOTE — Progress Notes (Signed)
This is 65 year old male who has multiple level C-spine injuries and needs orthopedic evaluation  The patient is awaiting a call from orthopedics.  We checked to determine the status of his appointments.  The patient has an appointment with Dr. Annell Greening for February 1 we will endeavor to get this patient to this appointment  I advised this patient to avoid all alcohol  No other changes in medications are made at this time  The patient will be assisted in applying for the orange card blue card and [place] discount  He has already established with a  and wellness clinic through a telehealth visit this week  He will have a follow-up visit with me face-to-face in the clinic upcoming

## 2020-09-08 ENCOUNTER — Ambulatory Visit: Payer: Self-pay | Admitting: Orthopaedic Surgery

## 2020-09-09 ENCOUNTER — Encounter: Payer: Self-pay | Admitting: Critical Care Medicine

## 2020-09-09 ENCOUNTER — Telehealth: Payer: Self-pay | Admitting: Critical Care Medicine

## 2020-09-09 NOTE — Telephone Encounter (Signed)
Called patient and LVM to schedule an in person visit with Delford Field in February. Advised patient to call back [phone], Please transfer to office if nothing available.

## 2020-09-10 ENCOUNTER — Telehealth: Payer: Self-pay | Admitting: *Deleted

## 2020-09-10 NOTE — Telephone Encounter (Signed)
Noted  

## 2020-09-10 NOTE — Telephone Encounter (Signed)
Called pt name twice, was told he may be out somewhere??? Will f/u monday

## 2020-09-10 NOTE — Progress Notes (Signed)
Patient ID: Cody Glenn, male   DOB: 1955/12/28, 65 y.o.   MRN: [id number] Is a 65 year old male seen at the [place] shelter clinic with severe cervical spine disease frequent falls alcohol use  The patient endorses he is still drinking one beer a day I suspect probably more than that.  Patient has a Rollator and he fell recently with this and injured his left forehead and also has injured his left hand.  On exam patient is awake and alert there is no acute neurologic deficits there is an abrasion over the left forehead over the left eye which is dressed and clean and he has an abrasion on his palm of his left hand at the base of the fifth finger this was also dressed  Patient missed his appointment Feb 01 with Dr. Ophelia Charter for evaluation of the spine I'm not going to have to get another appointment scheduled for him  Patient also struggling to get the orange card and blue card and [place] discount information together.  I will partner with the new nurses at the clinic to see if we can get the necessary information this patient needs.  He has been at my clinic once by way of a telehealth visit he will also need another appointment with me face-to-face in the [facility] health and wellness clinic.  I told the patient if he continues to fall he should be except being taken to the ER for further evaluation as that would be the best approach.  Also advised him he needs to stop drinking alcohol at this time

## 2020-09-10 NOTE — Telephone Encounter (Signed)
Also spoke msw about assisting

## 2020-09-16 ENCOUNTER — Encounter: Payer: Self-pay | Admitting: Critical Care Medicine

## 2020-09-16 ENCOUNTER — Other Ambulatory Visit: Payer: Self-pay | Admitting: Critical Care Medicine

## 2020-09-16 DIAGNOSIS — M4802 Spinal stenosis, cervical region: Secondary | ICD-10-CM

## 2020-09-17 NOTE — Progress Notes (Signed)
Patient ID: Cody Glenn, male   DOB: [birth year], 65 y.o.   MRN: [id number] 65 year old male seen today in the [place] [facility] clinic following up on paperwork for his orange card application and the fact that he had canceled his spine surgery appointment because he thought he had to have the orange card in order to go to the spine surgeon.  On exam blood pressure 124/76 pulse 82 saturation 100% room air  Patient has not fallen recently is using a walker is no longer drinking alcohol according to the patient  On exam the wound over his left eye has completely healed he seems to be a bit more ambulatory than before possibly related to the fact that is diminished alcohol intake  We connected him with one of the clinical social worker interns at the shelter to help complete the necessary items for his application and then we gave him the number that he is to call to get the appointment for financial assistance  I made another referral to Dr. Annell Greening for spine surgery evaluation and told the patient to please keep this next appointment as he canceled the February 1 appointment

## 2020-10-13 ENCOUNTER — Encounter: Payer: Self-pay | Admitting: Critical Care Medicine

## 2020-10-13 ENCOUNTER — Other Ambulatory Visit: Payer: Self-pay | Admitting: Critical Care Medicine

## 2020-10-13 ENCOUNTER — Ambulatory Visit: Payer: Self-pay | Attending: Critical Care Medicine | Admitting: Critical Care Medicine

## 2020-10-13 ENCOUNTER — Other Ambulatory Visit: Payer: Self-pay

## 2020-10-13 VITALS — BP 156/89 | HR 75 | Ht 65.87 in | Wt 150.8 lb

## 2020-10-13 DIAGNOSIS — Z7289 Other problems related to lifestyle: Secondary | ICD-10-CM

## 2020-10-13 DIAGNOSIS — Z720 Tobacco use: Secondary | ICD-10-CM

## 2020-10-13 DIAGNOSIS — I10 Essential (primary) hypertension: Secondary | ICD-10-CM | POA: Insufficient documentation

## 2020-10-13 DIAGNOSIS — Z789 Other specified health status: Secondary | ICD-10-CM

## 2020-10-13 DIAGNOSIS — E538 Deficiency of other specified B group vitamins: Secondary | ICD-10-CM | POA: Insufficient documentation

## 2020-10-13 DIAGNOSIS — K279 Peptic ulcer, site unspecified, unspecified as acute or chronic, without hemorrhage or perforation: Secondary | ICD-10-CM

## 2020-10-13 DIAGNOSIS — Z131 Encounter for screening for diabetes mellitus: Secondary | ICD-10-CM

## 2020-10-13 DIAGNOSIS — M4802 Spinal stenosis, cervical region: Secondary | ICD-10-CM

## 2020-10-13 DIAGNOSIS — R7989 Other specified abnormal findings of blood chemistry: Secondary | ICD-10-CM | POA: Insufficient documentation

## 2020-10-13 DIAGNOSIS — Z13220 Encounter for screening for lipoid disorders: Secondary | ICD-10-CM

## 2020-10-13 DIAGNOSIS — Z1159 Encounter for screening for other viral diseases: Secondary | ICD-10-CM

## 2020-10-13 DIAGNOSIS — Z5901 Sheltered homelessness: Secondary | ICD-10-CM

## 2020-10-13 MED ORDER — FOLIC ACID 1 MG PO TABS: 1.0000 mg | ORAL_TABLET | Freq: Every day | ORAL | 1 refills | Status: DC

## 2020-10-13 MED ORDER — THIAMINE HCL 100 MG PO TABS: 100.0000 mg | ORAL_TABLET | Freq: Every day | ORAL | 2 refills | Status: DC

## 2020-10-13 MED ORDER — VALSARTAN 80 MG PO TABS: 80.0000 mg | ORAL_TABLET | Freq: Every day | ORAL | 3 refills | Status: DC

## 2020-10-13 MED FILL — FOLIC ACID 1 MG TABS: 1 | 30 days supply | Qty: 30 | Fill #0

## 2020-10-13 MED FILL — VALSARTAN 80 MG TABLET: 80 | 30 days supply | Qty: 30 | Fill #0

## 2020-10-13 NOTE — Assessment & Plan Note (Signed)
Actively homeless at this time living in the shelter

## 2020-10-13 NOTE — Assessment & Plan Note (Signed)
Alcohol use down to 1 beer a day of recommended he reduce this further if he can we will refill the thiamine and folic acid

## 2020-10-13 NOTE — Assessment & Plan Note (Signed)
Patient is currently smoking about a half a pack of cigarettes a day patient given advice on smoking cessation

## 2020-10-13 NOTE — Progress Notes (Deleted)
   Subjective:    Patient ID: Cody Glenn, male    DOB: 06/22/1956, 65 y.o.   MRN: [id number]  65 y.o.M here to est PCP  10/13/2020      Review of Systems     Objective:   Physical Exam        Assessment & Plan:

## 2020-10-13 NOTE — Assessment & Plan Note (Signed)
Cervical spine stenosis with chronic neck pain  We will get patient into see his spine surgeon once he gets the orange card approved

## 2020-10-13 NOTE — Assessment & Plan Note (Signed)
Elevated blood pressure we will plan on beginning valsartan 80 mg daily and follow-up metabolic panel blood counts lipid panel

## 2020-10-13 NOTE — Assessment & Plan Note (Signed)
This has resolved.

## 2020-10-13 NOTE — Progress Notes (Signed)
F/u office visit

## 2020-10-13 NOTE — Assessment & Plan Note (Signed)
Continue thiamine folic acid supplement

## 2020-10-13 NOTE — Patient Instructions (Signed)
Begin valsartan hand 1 daily  Continue thiamine and folic acid daily  Please get your [place] financial assistance paperwork completed you need to obtain the IRS document showing you have not filed taxes that is the last thing you need to get done then let us know we will get you an appoint with a financial counselor  Lab work today includes metabolic panel blood count diabetes screening cholesterol hepatitis C  Return to see Dr. Delford Field in 3 months we will also check you at the shelter on a weekly basis  Once we get you financial assistance we will get you referred back to the spine surgeon

## 2020-10-13 NOTE — Progress Notes (Signed)
Subjective:    Patient ID: Cody Glenn, male    DOB: 07-Oct-1955, 65 y.o.   MRN: [id number]  History of Present Illness: 65 y.o.M here to est PCP 08/24/20 Last seen 07/22/20 at [facility]: This is a 65 year old male with history of heavy alcohol use seen at the [place] [facility].  He has been at the shelter for 1 week.  He has been drinking a quart and a half a day of beer and also actively smokes.  Prior to being in the shelter he was living on the street.  This patient's had several episodes of falling and note he was previously admitted with a significant fall and severe intoxication November 22.  At that time he was found to have significant cervical stenosis and was to have follow-up visit with neurosurgery but this appointment did not appear to occur.   Below is a copy of the discharge summary from that visit  Admit date:06/29/2020 Discharge date:07/06/2020  Admitted From:Street Disposition:Street/shelter  Recommendations for Outpatient Follow-up: 1. Follow up with PCP in 1 week 2. Neurosugery/ENT follow-up 3. Please obtain BMP/CBC in one week 4. Please follow up on the following pending results:None  Home Health:None Equipment/Devices:None  Discharge Condition:Stable CODE STATUS:Full code Diet recommendation:Heart healthy  Brief/Interim Summary:  Admission HPI written byVishal Delia Chimes, MD   Chief Complaint:Fall  HPI: Cody Berg Hollandis a 65 y.o.malewith medical history significant foralcohol and tobacco use who presents to the ED for evaluation after a fall.Patient states he was outside when he was walking along the sidewalk when he tripped over the curb. He states fell forward onto his face. He says he thinks he lost consciousness temporarily. He says he has been feeling lightheaded all day. He has been having nausea and vomiting and some shortness of breath. He reports a chronic cough which is unchanged. He  has not had any chest pain, abdominal pain, dysuria, diarrhea, seizure activity, loss of bowel/bladder control.  Patient does report chronic alcohol use, at least 3 quarts of beer daily with unspecified amount of liquor as well. He is a chronic smoker of 1 pack/day since age of 55. He denies any marijuana, cocaine, or IV drug use. He reports a history of bleeding stomach ulcer requiring surgery over 30 years ago. He is unaware of any other chronic medical conditions. He is not currently taking any medications. He [facility] He is unaware of any medical conditions in his immediate family.   Hospital course:  Hyponatremia Likely chronic. Patient with a fall prior to admission. Sodium of 111 on admission. Initially given a NS bolus given on admission followed by NS at 50 ml/hr with rapid improvement of sodium to 120 within 6 hours. IV fluids discontinued. Sodium continuing to increase with increased dietary intake and isnow resolved.  Nasal bone facture Left. Displaced medially. Recommendation for outpatient ENT follow-up  Fall Initially thought secondary to alcohol intoxication but possibly related to hyponatremia as well. OT recommending SNF but PT recommending no PT follow-up.  Alcohol abuse Continues to have low CIWA scores.  Tobacco use Cessation discussed -Continue nicotine patch  Bilateral neuropathic pain Patient states that this is somewhat new. CT cervical spine significant for moderate-severe C5-C6/C6-C7 stenosis. MRI significant for severe stenosis at C3-4 with moderate stenosis at C5-6 and widespread severe foraminal stenosis. Patient with upper extremity weakness/numbness.Neurosurgery consulted and recommending cervical fusion surgery as an outpatient.  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  Elevated BP Started on clonidine. Patient left prior to receiving recommendations for  prescriptions.  Hyperbilirubinemia Likely related alcohol use. AST of 43 in setting of alcohol use. RUQ ultrasound with no evidence of cirrhosis  Hypokalemia Supplementation  Discharge Diagnoses: Principal Problem: Hyponatremia Active Problems: Alcohol use Tobacco use Malnutrition of moderate degree Hyperbilirubinemia Hypokalemia  This patient had another fall yesterday and was sent to the emergency room where he was evaluated the CT scan of the head and facial CT scan showing nasal fracture anteriorly but no bleeding in the brain.  Note his sodium at that visit was 141 and had resolved in terms of hyponatremia.  He has had one episode of nausea and vomiting today.  He states he does have a slight degree of headache in the right ear area.  He also complains of weakness in both shoulders and pain in both shoulders and he has very unsteady gait.  He has no abdominal pain or shortness of breath.  On exam blood pressure is 136/76 pulse 94 saturation 100% room air chest was clear cardiac exam unremarkable abdomen benign the patient has a very unsteady gait.  Impression is that of cervical stenosis which needs neurosurgical orthopedic spine evaluation ASAP #2 diarrhea status post heavy alcohol use #3 hyponatremia which is resolved  Plan for this patient is received thiamine folic acid multivitamin supplementation #2 we will get a referral to neurosurgery ASAP #3 we will obtain a walker for the patient #4 get this patient access to a [place] clinic  Pt had no show visit 08/24/20 due to snow  10/13/2020 This is the first official face-to-face visit in the clinic the first visit in January was a telephone visit because of the weather.  Patient states he is reduced his alcohol consumption considerably at this time.  He is drinking only 1 beer a day now.  On arrival blood pressure was 156/89.  He denies any recent falls.  He does use a Rollator to ambulate.  Still has some neck  stiffness.  He is working on a plan we may move out of the shelter in with his daughter.  He is yet to see the spine surgeon because he does not have the orange card as of yet.  He still waiting on getting the tax information.  No other complaints at this time.  Patient does maintain thiamine folic acid supplementation.     Past Medical History:  Diagnosis Date  . Alcohol use   . Hyperbilirubinemia 07/01/2020  . Peptic ulcer 08/09/1987   Jul 28, 2008 Entered By: Jeral Pinch Comment: s/p partial gastrectomy     History reviewed. No pertinent family history.   Social History   Socioeconomic History  . Marital status: Single    Spouse name: Not on file  . Number of children: Not on file  . Years of education: Not on file  . Highest education level: Not on file  Occupational History  . Not on file  Tobacco Use  . Smoking status: Current Every Day Smoker    Packs/day: 1.00    Years: 40.00    Pack years: 40.00    Types: Cigarettes  . Smokeless tobacco: Never Used  Vaping Use  . Vaping Use: Never used  Substance and Sexual Activity  . Alcohol use: Yes    Comment: "lots" on 06/29/20  . Drug use: Not Currently  . Sexual activity: Not on file  Other Topics Concern  . Not on file  Social  History Narrative  . Not on file   Social Determinants of Health   Financial Resource Strain: Not on file  Food Insecurity: Not on file  Transportation Needs: Not on file  Physical Activity: Not on file  Stress: Not on file  Social Connections: Not on file  Intimate Partner Violence: Not on file     No Known Allergies   Outpatient Medications Prior to Visit  Medication Sig Dispense Refill  . folic acid (FOLVITE) 1 MG tablet Take 1 tablet (1 mg total) by mouth daily. 60 tablet 1  . thiamine 100 MG tablet Take 1 tablet (100 mg total) by mouth daily. 60 tablet 2   No facility-administered medications prior to visit.    Review of Systems  HENT: Negative.   Respiratory:  Negative.   Cardiovascular: Negative.   Gastrointestinal: Positive for nausea and vomiting.  Genitourinary: Negative.   Musculoskeletal: Positive for back pain, gait problem and neck pain.       Objective:   Physical Exam  Vitals:   10/13/20 1348  BP: (!) 156/89  Pulse: 75  SpO2: 100%  Weight: 150 lb 12.8 oz (68.4 kg)  Height: 5' 5.87" (1.673 m)    Gen: Pleasant, well-nourished, in no distress,  normal affect  ENT: No lesions,  mouth clear,  oropharynx clear, no postnasal drip  Neck: No JVD, no TMG, no carotid bruits  Lungs: No use of accessory muscles, no dullness to percussion, clear without rales or rhonchi  Cardiovascular: RRR, heart sounds normal, no murmur or gallops, no peripheral edema  Abdomen: soft and NT, no HSM,  BS normal  Musculoskeletal: No deformities, no cyanosis or clubbing  Neuro: alert, non focal, left lower extremity 4/5 strength right lower extremity 5/5  Skin: Warm, no lesions or rashes  No results found.        Assessment & Plan:  I personally reviewed all [facility] and [facility] as well as any outside material available during this office visit and agree with the  radiology impressions.   Cervical stenosis of spine Cervical spine stenosis with chronic neck pain  We will get patient into see his spine surgeon once he gets the orange card approved  Alcohol use Alcohol use down to 1 beer a day of recommended he reduce this further if he can we will refill the thiamine and folic acid  Peptic ulcer This has resolved  Other B-complex deficiencies Continue thiamine folic acid supplement  Sheltered homelessness Actively homeless at this time living in the shelter  Tobacco use Patient is currently smoking about a half a pack of cigarettes a day patient given advice on smoking cessation  Primary hypertension Elevated blood pressure we will plan on beginning valsartan 80 mg daily and follow-up metabolic panel blood counts  lipid panel   Cody NeedleMichael was seen today for establish care.  Diagnoses and all orders for this visit:  Primary hypertension -     Comprehensive metabolic panel -     CBC with Differential/Platelet  Sheltered homelessness  Need for hepatitis C screening test -     HCV Ab w Reflex to Quant PCR  Diabetes mellitus screening -     Hemoglobin A1c  Lipid screening -     Lipid Panel  Cervical stenosis of spine  Alcohol use  Peptic ulcer  Tobacco use  Other orders -     valsartan (DIOVAN) 80 MG tablet; Take 1 tablet (80 mg total) by mouth daily. -  folic acid (FOLVITE) 1 MG tablet; Take 1 tablet (1 mg total) by mouth daily. -     thiamine 100 MG tablet; Take 1 tablet (100 mg total) by mouth daily.  Check hepatitis C assay  Patient is fully vaccinated with flu vaccine and also COVID vaccines

## 2020-10-14 LAB — CBC WITH DIFFERENTIAL/PLATELET
Basophils Absolute: 0.1 10*3/uL (ref 0.0–0.2)
Basos: 1 %
EOS (ABSOLUTE): 0.1 10*3/uL (ref 0.0–0.4)
Eos: 1 %
Hematocrit: 44.6 % (ref 37.5–51.0)
Hemoglobin: 14.8 g/dL (ref 13.0–17.7)
Immature Grans (Abs): 0 10*3/uL (ref 0.0–0.1)
Immature Granulocytes: 0 %
Lymphocytes Absolute: 2.2 10*3/uL (ref 0.7–3.1)
Lymphs: 36 %
MCH: 30.1 pg (ref 26.6–33.0)
MCHC: 33.2 g/dL (ref 31.5–35.7)
MCV: 91 fL (ref 79–97)
Monocytes Absolute: 0.6 10*3/uL (ref 0.1–0.9)
Monocytes: 11 %
Neutrophils Absolute: 3 10*3/uL (ref 1.4–7.0)
Neutrophils: 51 %
Platelets: 314 10*3/uL (ref 150–450)
RBC: 4.92 x10E6/uL (ref 4.14–5.80)
RDW: 15 % (ref 11.6–15.4)
WBC: 5.9 10*3/uL (ref 3.4–10.8)

## 2020-10-14 LAB — COMPREHENSIVE METABOLIC PANEL
ALT: 14 IU/L (ref 0–44)
AST: 37 IU/L (ref 0–40)
Albumin/Globulin Ratio: 1.5 (ref 1.2–2.2)
Albumin: 4.8 g/dL (ref 3.8–4.8)
Alkaline Phosphatase: 123 IU/L — ABNORMAL HIGH (ref 44–121)
BUN/Creatinine Ratio: 10 (ref 10–24)
BUN: 7 mg/dL — ABNORMAL LOW (ref 8–27)
Bilirubin Total: 0.6 mg/dL (ref 0.0–1.2)
CO2: 20 mmol/L (ref 20–29)
Calcium: 9.7 mg/dL (ref 8.6–10.2)
Chloride: 101 mmol/L (ref 96–106)
Creatinine, Ser: 0.73 mg/dL — ABNORMAL LOW (ref 0.76–1.27)
Globulin, Total: 3.3 g/dL (ref 1.5–4.5)
Glucose: 87 mg/dL (ref 65–99)
Potassium: 4.6 mmol/L (ref 3.5–5.2)
Sodium: 140 mmol/L (ref 134–144)
Total Protein: 8.1 g/dL (ref 6.0–8.5)
eGFR: 102 mL/min/{1.73_m2} (ref >59)

## 2020-10-14 LAB — LIPID PANEL
Chol/HDL Ratio: 2 ratio (ref 0.0–5.0)
Cholesterol, Total: 213 mg/dL — ABNORMAL HIGH (ref 100–199)
HDL: 106 mg/dL (ref >39)
LDL Chol Calc (NIH): 93 mg/dL (ref 0–99)
Triglycerides: 84 mg/dL (ref 0–149)
VLDL Cholesterol Cal: 14 mg/dL (ref 5–40)

## 2020-10-14 LAB — HCV INTERPRETATION

## 2020-10-14 LAB — HEMOGLOBIN A1C
Est. average glucose Bld gHb Est-mCnc: 111 mg/dL
Hgb A1c MFr Bld: 5.5 % (ref 4.8–5.6)

## 2020-10-14 LAB — HCV AB W REFLEX TO QUANT PCR: HCV Ab: <0.1 {s_co_ratio} (ref 0.0–0.9)

## 2020-10-22 ENCOUNTER — Encounter (HOSPITAL_COMMUNITY): Payer: Self-pay | Admitting: *Deleted

## 2020-10-22 ENCOUNTER — Emergency Department (HOSPITAL_COMMUNITY)
Admission: EM | Admit: 2020-10-22 | Discharge: 2020-10-22 | Discharge status: Against Medical Advice | Payer: Self-pay | Attending: Emergency Medicine | Admitting: Emergency Medicine

## 2020-10-22 DIAGNOSIS — M25511 Pain in right shoulder: Secondary | ICD-10-CM | POA: Insufficient documentation

## 2020-10-22 DIAGNOSIS — W19XXXA Unspecified fall, initial encounter: Secondary | ICD-10-CM

## 2020-10-22 DIAGNOSIS — F17210 Nicotine dependence, cigarettes, uncomplicated: Secondary | ICD-10-CM | POA: Insufficient documentation

## 2020-10-22 DIAGNOSIS — M25512 Pain in left shoulder: Secondary | ICD-10-CM | POA: Insufficient documentation

## 2020-10-22 DIAGNOSIS — F10129 Alcohol abuse with intoxication, unspecified: Secondary | ICD-10-CM | POA: Insufficient documentation

## 2020-10-22 DIAGNOSIS — M542 Cervicalgia: Secondary | ICD-10-CM | POA: Insufficient documentation

## 2020-10-22 DIAGNOSIS — W010XXA Fall on same level from slipping, tripping and stumbling without subsequent striking against object, initial encounter: Secondary | ICD-10-CM | POA: Insufficient documentation

## 2020-10-22 NOTE — ED Provider Notes (Signed)
[facility]-EMERGENCY DEPT Provider Note   CSN: [id number] Arrival date & time: 10/22/20  1007     History Chief Complaint  Patient presents with  . Alcohol Intoxication    Cody Glenn is a 65 y.o. male with a past medical history significant for alcohol abuse, peptic ulcer disease, hypertension, and homelessness who presents to the ED after a mechanical fall. Patient states he tripped on his walker causing him to fall to the ground. Denies head injury and loss of consciousness. He admits to bilateral shoulder pain throughout the posterior aspect, worse with movement and palpation. Denies associated numbness/tingling or weakness. Admits to chronic neck pain. No change from baseline. He is not currently on any blood thinners. Denies chest pain, shortness of breath, back pain, abdominal pain, nausea, and vomiting. No treatment prior to arrival.   History obtained from patient and past medical records. No interpreter used during encounter.      Past Medical History:  Diagnosis Date  . Alcohol use   . Hyperbilirubinemia 07/01/2020  . Peptic ulcer 08/09/1987   Jul 28, 2008 Entered By: Jeral Pinch Comment: s/p partial gastrectomy    Patient Active Problem List   Diagnosis Date Noted  . Other B-complex deficiencies 10/13/2020  . Sheltered homelessness 10/13/2020  . Primary hypertension 10/13/2020  . Cervical stenosis of spine 08/24/2020  . Alcohol use 06/29/2020  . Tobacco use 06/29/2020    Past Surgical History:  Procedure Laterality Date  . ulcer surgery in stomach     pt states he had this approx 33 years ago       No family history on file.  Social History   Tobacco Use  . Smoking status: Current Every Day Smoker    Packs/day: 1.00    Years: 40.00    Pack years: 40.00    Types: Cigarettes  . Smokeless tobacco: Never Used  Vaping Use  . Vaping Use: Never used  Substance Use Topics  . Alcohol use: Yes    Comment: "lots" on 06/29/20   . Drug use: Not Currently    Home Medications Prior to Admission medications   Medication Sig Start Date End Date Taking? Authorizing Provider  folic acid (FOLVITE) 1 MG tablet Take 1 tablet (1 mg total) by mouth daily. 10/13/20   Storm Frisk, MD  thiamine 100 MG tablet Take 1 tablet (100 mg total) by mouth daily. 10/13/20   Storm Frisk, MD  valsartan (DIOVAN) 80 MG tablet Take 1 tablet (80 mg total) by mouth daily. 10/13/20   Storm Frisk, MD    Allergies    Patient has no known allergies.  Review of Systems   Review of Systems  Eyes: Negative for visual disturbance.  Respiratory: Negative for shortness of breath.   Cardiovascular: Negative for chest pain.  Gastrointestinal: Negative for abdominal pain, nausea and vomiting.  Musculoskeletal: Positive for arthralgias and neck pain. Negative for back pain.  Neurological: Negative for dizziness, numbness and headaches.  All other systems reviewed and are negative.   Physical Exam Updated Vital Signs BP (!) 159/96   Pulse (!) 59   Temp 97.7 F (36.5 C) (Oral)   Resp 18   SpO2 100%   Physical Exam Vitals and nursing note reviewed.  Constitutional:      General: He is not in acute distress. HENT:     Head: Normocephalic.  Eyes:     Extraocular Movements: Extraocular movements intact.     Pupils: Pupils are equal,  round, and reactive to light.  Neck:     Comments: Mild cervical midline tenderness Cardiovascular:     Rate and Rhythm: Normal rate and regular rhythm.     Pulses: Normal pulses.     Heart sounds: Normal heart sounds. No murmur heard. No friction rub. No gallop.   Pulmonary:     Effort: Pulmonary effort is normal.     Breath sounds: Normal breath sounds.  Abdominal:     General: Abdomen is flat. Bowel sounds are normal. There is no distension.     Palpations: Abdomen is soft.     Tenderness: There is no abdominal tenderness. There is no guarding or rebound.  Musculoskeletal:     Cervical  back: Neck supple.     Comments: No thoracic or lumbar midline tenderness. Tenderness throughout posterior aspect of bilateral shoulders. Radial pulse intact bilaterally. Limited ROM of bilateral shoulders with overhead motion. Soft compartments. Full ROM of bilateral elbows.   Skin:    General: Skin is warm and dry.  Neurological:     General: No focal deficit present.     Comments: Speech is clear, able to follow commands CN III-XII intact Normal strength in upper and lower extremities bilaterally including dorsiflexion and plantar flexion, strong and equal grip strength Sensation grossly intact throughout Moves extremities without ataxia, coordination intact No pronator drift  Psychiatric:        Mood and Affect: Mood normal.        Behavior: Behavior normal.     ED Results / Procedures / Treatments   Labs (all labs ordered are listed, but only abnormal results are displayed) Labs Reviewed - No data to display  EKG None  Radiology No results found.  Procedures Procedures   Medications Ordered in ED Medications - No data to display  ED Course  I have reviewed the triage vital signs and the nursing notes.  Pertinent labs & imaging results that were available during my care of the patient were reviewed by me and considered in my medical decision making (see chart for details).    MDM Rules/Calculators/A&P                         65 year old male presents to the ED after mechanical fall.  Patient denies head injury however, there is dirt covering left side of face. No LOC. Upon arrival, vitals all within normal limits. Patient in no acute distress and non-toxic appearing. Mild cervical midline tenderness. Normal neurological exam. Tenderness throughout bilateral shoulders. Bilateral upper extremities neurovascularly intact. Offered imaging of bilateral shoulder, head, and neck; however, patient refused and does not want any imaging or further work-up. I had a long discussion  with patient I can not rule out any emergent conditions without images and he continues to defer any images. Informed by RN that patient left AMA.  Final Clinical Impression(s) / ED Diagnoses Final diagnoses:  Fall, initial encounter  Acute pain of both shoulders    Rx / DC Orders ED Discharge Orders    None       Jesusita Oka 10/22/20 1234    Wynetta Fines, MD 10/23/20 [phone]

## 2020-10-22 NOTE — ED Notes (Signed)
Patient reporting he wants to leave and does not want a CT or our care anymore. PA Rayfield Citizen made aware. Patient ambulatory with walker outside of hospital. Patient given bus pass, blanket, sandwich, and gingerale.

## 2020-10-22 NOTE — ED Triage Notes (Signed)
Per EMS, pt fell down using his walker, EMS was called. He wanted to go [facility] down [facility] but could not walk. He reports drinking 2 beers this morning. No complaints.  BP 140/96 HR 60 RR 22 SpO2 99% CBG 137

## 2020-11-04 ENCOUNTER — Encounter: Payer: Self-pay | Admitting: Physician Assistant

## 2020-11-04 NOTE — Progress Notes (Addendum)
Pt came by today.  The staff has not been letting him rest upstairs in the afternoons.  Note given re: allow him to go upstairs for 2 hrs every afternoon.   Pt still w/ back pain. Ibuprofen given for this. 200 mg bid prn  Still does not have orange card >> will get S.W. to work with him on this.  Will get appt with the surgeons once he gets the [facility].  Never got the Valsartan 80 mg qd filled, Myriam Jacobson will obtain.  EMS ECG reviewed. SR, HR 80, no acute ischemic changes.  BP 140/96, RR 24 , O2 sats 99%, [id number]  No other issues or concerns.  Theodore Demark, PA-C 11/04/2020 2:17 PM

## 2020-11-05 MED FILL — ?VALSARTAN 80MG TABLET: 80 | 30 days supply | Qty: 30 | Fill #0

## 2020-11-11 ENCOUNTER — Encounter: Payer: Self-pay | Admitting: Critical Care Medicine

## 2020-11-11 ENCOUNTER — Other Ambulatory Visit: Payer: Self-pay | Admitting: Critical Care Medicine

## 2020-11-11 ENCOUNTER — Other Ambulatory Visit (HOSPITAL_COMMUNITY): Payer: Self-pay

## 2020-11-11 MED ORDER — VALSARTAN 80 MG PO TABS
80.0000 mg | ORAL_TABLET | Freq: Every day | ORAL | 3 refills | Status: DC
  Filled 2020-11-11: qty 30, 30d supply, fill #0

## 2020-11-11 NOTE — Progress Notes (Signed)
Patient ID: SANCHEZ HEMMER, male   DOB: 1956/01/12, 65 y.o.   MRN: [id number] This is a fit 65 year old male who has been to the [facility] at least 1 time seen today at the [facility] in follow-up.  He needs to get an appointment with financial counselor for his orange card.  He has severe C-spine disease and will need a spinal evaluation he is compliant with his valsartan and 80 mg daily  On exam blood pressure 130/80 pulse 74 saturation 97% room air  Impression is that of cervical spine disease and stable hypertension  We have achieved an appointment financial counselor on April 11 and he will see me on April 19 from there we hope to get him to spinal surgery for evaluation

## 2020-11-12 ENCOUNTER — Encounter: Payer: Self-pay | Admitting: *Deleted

## 2020-11-12 NOTE — Congregational Nurse Program (Unsigned)
  Dept: [phone]   Congregational Nurse Program Note  Date of Encounter: 11/12/2020  Past Medical History: Past Medical History:  Diagnosis Date  . Alcohol use   . Hyperbilirubinemia 07/01/2020  . Peptic ulcer 08/09/1987   Jul 28, 2008 Entered By: Jeral Pinch Comment: s/p partial gastrectomy    Encounter Details: pt ask to have help with orange card/ medicaid forms, ask sw students to see pt, he has gone for a walk, they will continue to try to speak with him

## 2020-11-12 NOTE — Congregational Nurse Program (Unsigned)
  Dept: [phone]   Congregational Nurse Program Note  Date of Encounter: 11/12/2020  Past Medical History: Past Medical History:  Diagnosis Date  . Alcohol use   . Hyperbilirubinemia 07/01/2020  . Peptic ulcer 08/09/1987   Jul 28, 2008 Entered By: Jeral Pinch Comment: s/p partial gastrectomy    Encounter Details:  CNP Questionnaire - 11/12/20 1423      Questionnaire   Do you give verbal consent to treat you today? Yes

## 2020-11-16 ENCOUNTER — Ambulatory Visit: Payer: Self-pay

## 2020-11-23 NOTE — Progress Notes (Deleted)
Subjective:    Patient ID: Cody Glenn, male    DOB: [birth year], 65 y.o.   MRN: [id number]  History of Present Illness: 65 y.o.M here to est PCP 08/24/20 Last seen 07/22/20 at [facility]: This is a 65 year old male with history of heavy alcohol use seen at the [place] [facility] clinic.  He has been at the shelter for 1 week.  He has been drinking a quart and a half a day of beer and also actively smokes.  Prior to being in the shelter he was living on the street.  This patient's had several episodes of falling and note he was previously admitted with a significant fall and severe intoxication November 22.  At that time he was found to have significant cervical stenosis and was to have follow-up visit with neurosurgery but this appointment did not appear to occur.   Below is a copy of the discharge summary from that visit  Admit date:06/29/2020 Discharge date:07/06/2020  Admitted From:Street Disposition:Street/shelter  Recommendations for Outpatient Follow-up: 1. Follow up with PCP in 1 week 2. Neurosugery/ENT follow-up 3. Please obtain BMP/CBC in one week 4. Please follow up on the following pending results:None  Home Health:None Equipment/Devices:None  Discharge Condition:Stable CODE STATUS:Full code Diet recommendation:Heart healthy  Brief/Interim Summary:  Admission HPI written byVishal Delia Chimes, MD   Chief Complaint:Fall  HPI: Cody Gloss Hollandis a 65 y.o.malewith medical history significant foralcohol and tobacco use who presents to the ED for evaluation after a fall.Patient states he was outside when he was walking along the sidewalk when he tripped over the curb. He states fell forward onto his face. He says he thinks he lost consciousness temporarily. He says he has been feeling lightheaded all day. He has been having nausea and vomiting and some shortness of breath. He reports a chronic cough which is unchanged. He  has not had any chest pain, abdominal pain, dysuria, diarrhea, seizure activity, loss of bowel/bladder control.  Patient does report chronic alcohol use, at least 3 quarts of beer daily with unspecified amount of liquor as well. He is a chronic smoker of 1 pack/day since age of 89. He denies any marijuana, cocaine, or IV drug use. He reports a history of bleeding stomach ulcer requiring surgery over 30 years ago. He is unaware of any other chronic medical conditions. He is not currently taking any medications. [facility] He is unaware of any medical conditions in his immediate family.   Hospital course:  Hyponatremia Likely chronic. Patient with a fall prior to admission. Sodium of 111 on admission. Initially given a NS bolus given on admission followed by NS at 50 ml/hr with rapid improvement of sodium to 120 within 6 hours. IV fluids discontinued. Sodium continuing to increase with increased dietary intake and isnow resolved.  Nasal bone facture Left. Displaced medially. Recommendation for outpatient ENT follow-up  Fall Initially thought secondary to alcohol intoxication but possibly related to hyponatremia as well. OT recommending SNF but PT recommending no PT follow-up.  Alcohol abuse Continues to have low CIWA scores.  Tobacco use Cessation discussed -Continue nicotine patch  Bilateral neuropathic pain Patient states that this is somewhat new. CT cervical spine significant for moderate-severe C5-C6/C6-C7 stenosis. MRI significant for severe stenosis at C3-4 with moderate stenosis at C5-6 and widespread severe foraminal stenosis. Patient with upper extremity weakness/numbness.Neurosurgery consulted and recommending cervical fusion surgery as an outpatient.  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  Elevated BP Started on clonidine. Patient left prior to receiving recommendations for  prescriptions.  Hyperbilirubinemia Likely related alcohol use. AST of 43 in setting of alcohol use. RUQ ultrasound with no evidence of cirrhosis  Hypokalemia Supplementation  Discharge Diagnoses: Principal Problem: Hyponatremia Active Problems: Alcohol use Tobacco use Malnutrition of moderate degree Hyperbilirubinemia Hypokalemia  This patient had another fall yesterday and was sent to the emergency room where he was evaluated the CT scan of the head and facial CT scan showing nasal fracture anteriorly but no bleeding in the brain.  Note his sodium at that visit was 141 and had resolved in terms of hyponatremia.  He has had one episode of nausea and vomiting today.  He states he does have a slight degree of headache in the right ear area.  He also complains of weakness in both shoulders and pain in both shoulders and he has very unsteady gait.  He has no abdominal pain or shortness of breath.  On exam blood pressure is 136/76 pulse 94 saturation 100% room air chest was clear cardiac exam unremarkable abdomen benign the patient has a very unsteady gait.  Impression is that of cervical stenosis which needs neurosurgical orthopedic spine evaluation ASAP #2 diarrhea status post heavy alcohol use #3 hyponatremia which is resolved  Plan for this patient is received thiamine folic acid multivitamin supplementation #2 we will get a referral to neurosurgery ASAP #3 we will obtain a walker for the patient #4 get this patient access to a [place] clinic  Pt had no show visit 08/24/20 due to snow  10/13/2020 This is the first official face-to-face visit in the clinic the first visit in January was a telephone visit because of the weather.  Patient states he is reduced his alcohol consumption considerably at this time.  He is drinking only 1 beer a day now.  On arrival blood pressure was 156/89.  He denies any recent falls.  He does use a Rollator to ambulate.  Still has some neck  stiffness.  He is working on a plan we may move out of the shelter in with his daughter.  He is yet to see the spine surgeon because he does not have the orange card as of yet.  He still waiting on getting the tax information.  No other complaints at this time.  Patient does maintain thiamine folic acid supplementation.     Past Medical History:  Diagnosis Date  . Alcohol use   . Hyperbilirubinemia 07/01/2020  . Peptic ulcer 08/09/1987   Jul 28, 2008 Entered By: Jeral Pinch Comment: s/p partial gastrectomy     No family history on file.   Social History   Socioeconomic History  . Marital status: Single    Spouse name: Not on file  . Number of children: Not on file  . Years of education: Not on file  . Highest education level: Not on file  Occupational History  . Not on file  Tobacco Use  . Smoking status: Current Every Day Smoker    Packs/day: 1.00    Years: 40.00    Pack years: 40.00    Types: Cigarettes  . Smokeless tobacco: Never Used  Vaping Use  . Vaping Use: Never used  Substance and Sexual Activity  . Alcohol use: Yes    Comment: "lots" on 06/29/20  . Drug use: Not Currently  . Sexual activity: Not on file  Other Topics Concern  . Not on file  Social History  Narrative  . Not on file   Social Determinants of Health   Financial Resource Strain: Not on file  Food Insecurity: Not on file  Transportation Needs: Not on file  Physical Activity: Not on file  Stress: Not on file  Social Connections: Not on file  Intimate Partner Violence: Not on file     No Known Allergies   Outpatient Medications Prior to Visit  Medication Sig Dispense Refill  . folic acid (FOLVITE) 1 MG tablet Take 1 tablet (1 mg total) by mouth daily. 60 tablet 1  . folic acid (FOLVITE) 1 MG tablet TAKE 1 TABLET (1 MG TOTAL) BY MOUTH DAILY. 60 tablet 1  . thiamine 100 MG tablet Take 1 tablet (100 mg total) by mouth daily. 60 tablet 2  . thiamine 100 MG tablet TAKE 1 TABLET (100  MG TOTAL) BY MOUTH DAILY. 60 tablet 2  . valsartan (DIOVAN) 80 MG tablet Take 1 tablet (80 mg total) by mouth daily. 30 tablet 3   No facility-administered medications prior to visit.    Review of Systems  HENT: Negative.   Respiratory: Negative.   Cardiovascular: Negative.   Gastrointestinal: Positive for nausea and vomiting.  Genitourinary: Negative.   Musculoskeletal: Positive for back pain, gait problem and neck pain.       Objective:   Physical Exam There were no vitals filed for this visit.  Gen: Pleasant, well-nourished, in no distress,  normal affect  ENT: No lesions,  mouth clear,  oropharynx clear, no postnasal drip  Neck: No JVD, no TMG, no carotid bruits  Lungs: No use of accessory muscles, no dullness to percussion, clear without rales or rhonchi  Cardiovascular: RRR, heart sounds normal, no murmur or gallops, no peripheral edema  Abdomen: soft and NT, no HSM,  BS normal  Musculoskeletal: No deformities, no cyanosis or clubbing  Neuro: alert, non focal, left lower extremity 4/5 strength right lower extremity 5/5  Skin: Warm, no lesions or rashes  No results found.        Assessment & Plan:  I personally reviewed all images and lab data in the [facility] system as well as any outside material available during this office visit and agree with the  radiology impressions.   No problem-specific Assessment & Plan notes found for this encounter.   There are no diagnoses linked to this encounter.Check hepatitis C assay  Patient is fully vaccinated with flu vaccine and also COVID vaccines

## 2020-11-24 ENCOUNTER — Ambulatory Visit: Payer: Self-pay | Admitting: Critical Care Medicine

## 2021-10-22 IMAGING — CT CT MAXILLOFACIAL W/O CM
3 series · 15 of 47 positions shown, 18 images · non-contrast
Comparison: None.

CLINICAL DATA: Facial trauma.

EXAM:
CT MAXILLOFACIAL WITHOUT CONTRAST
TECHNIQUE: Multidetector CT imaging of the maxillofacial structures was
performed. Multiplanar CT image reconstructions were also generated.

[Series 3: max soft · axial · 0.35mm/px · z∈[-224,-60]mm · 9 of 96 slices shown, 12 images]
[im 7/96  brain]
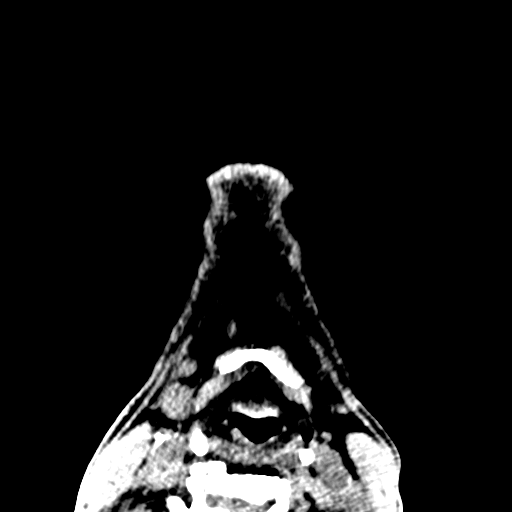
[im 7/96  bone]
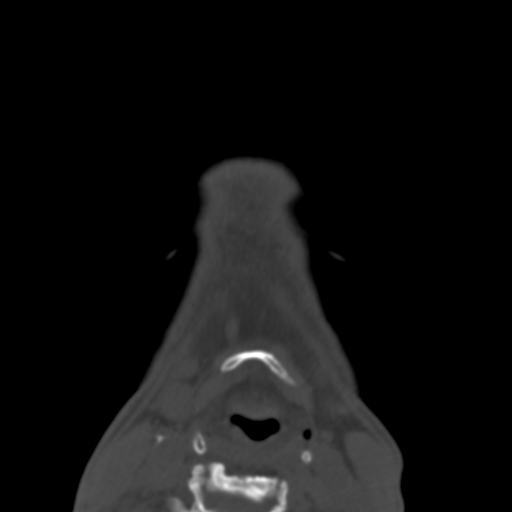
[im 17/96  bone]
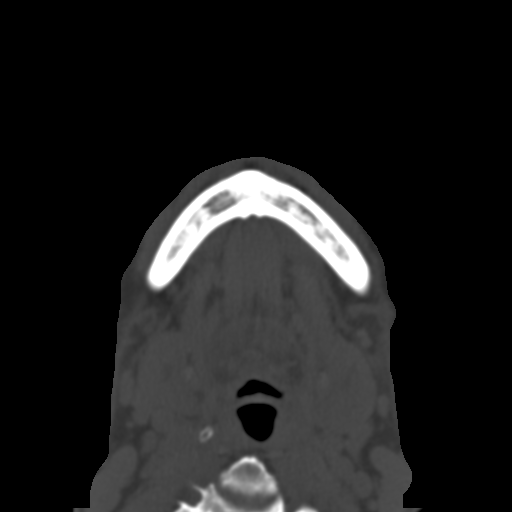
[im 27/96  bone]
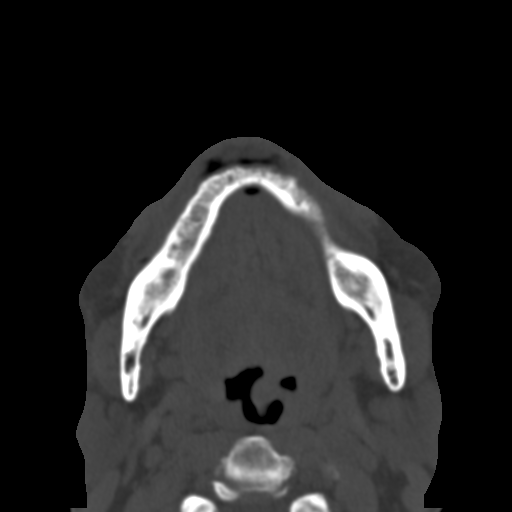
[im 37/96  bone]
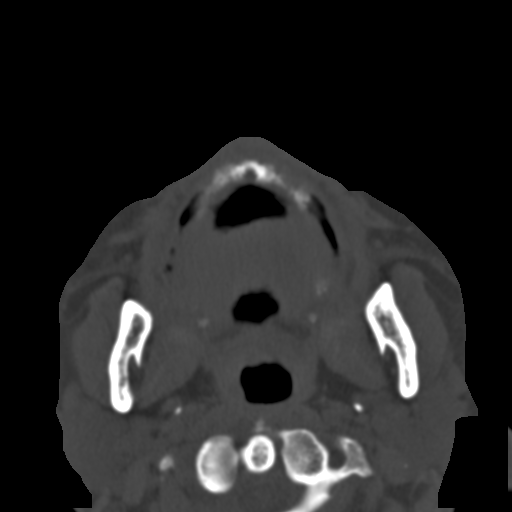
[im 50/96  brain]
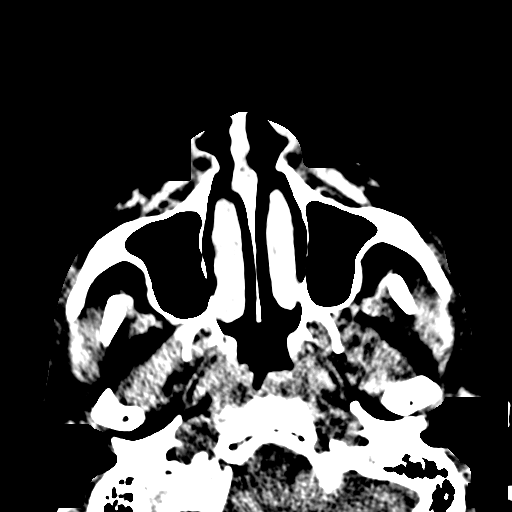
[im 50/96  bone]
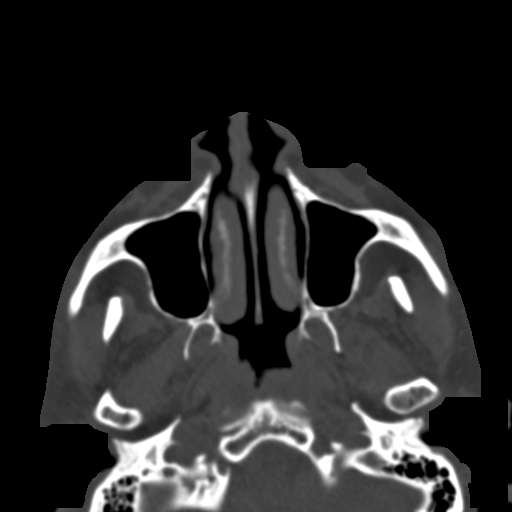
[im 59/96  bone]
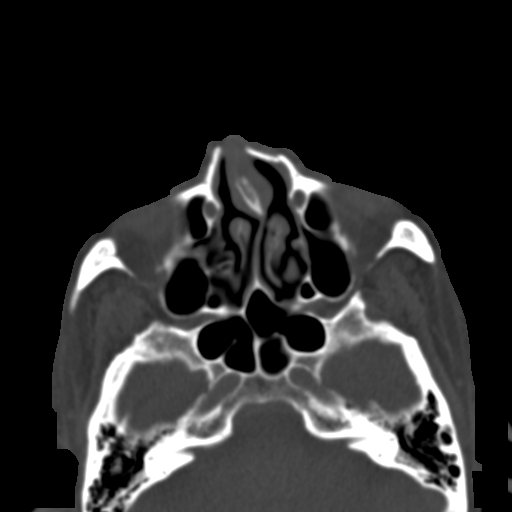
[im 69/96  bone]
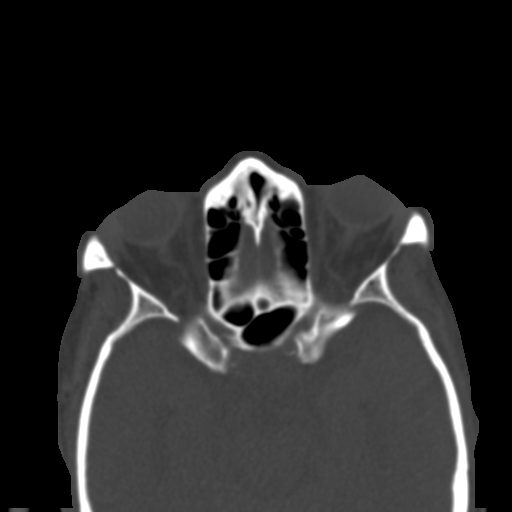
[im 79/96  bone]
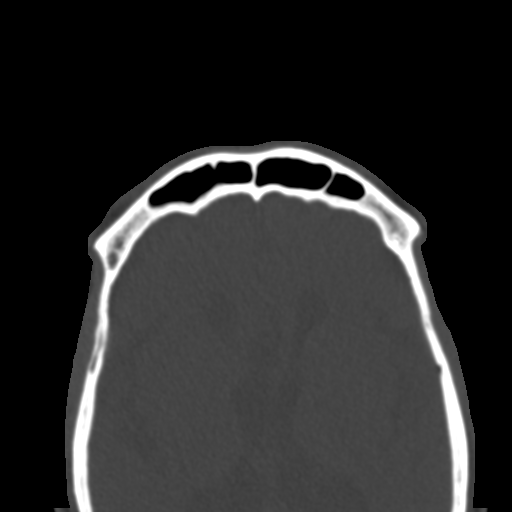
[im 89/96  brain]
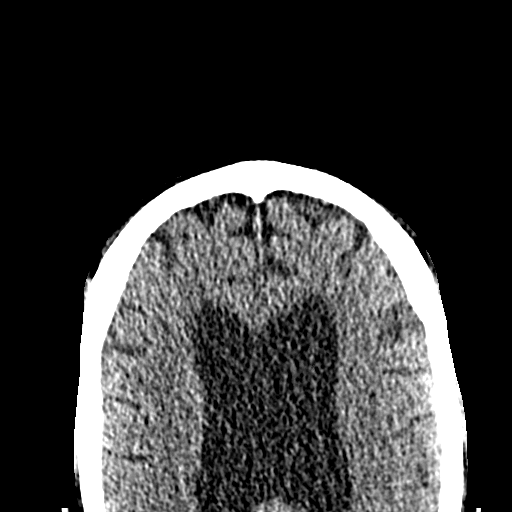
[im 89/96  bone]
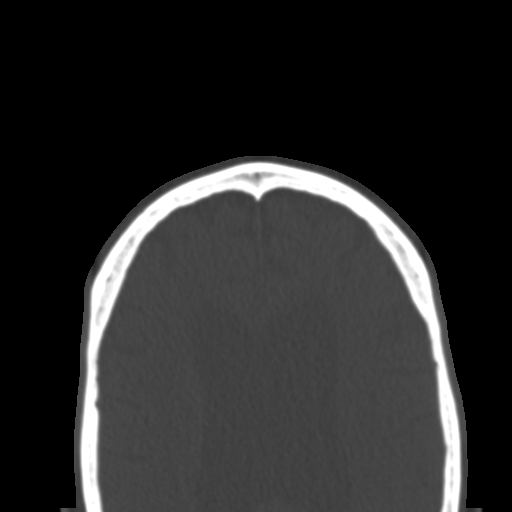

[Series 7: coronal soft · coronal · 0.34mm/px · 3 of 76 slices shown]
[im 26/76  bone]
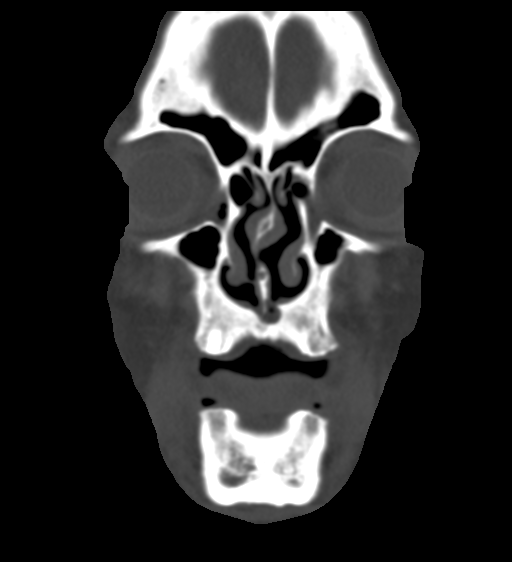
[im 34/76  bone]
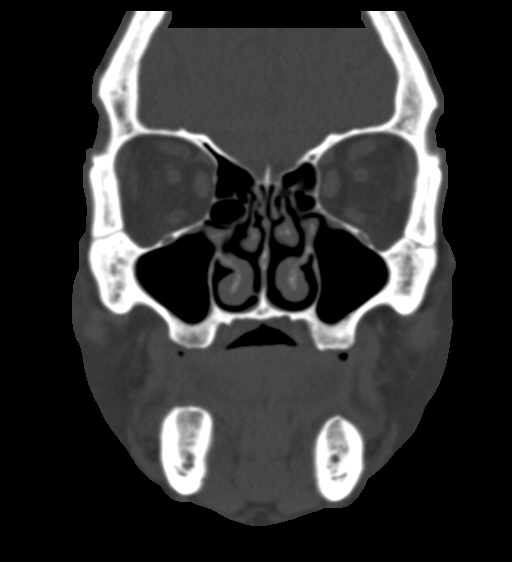
[im 42/76  bone]
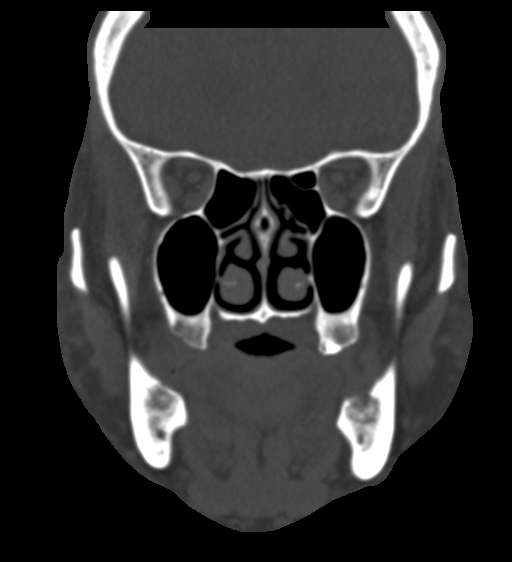

[Series 8: sagittal soft · sagittal · 0.30mm/px · 3 of 88 slices shown]
[im 30/88  bone]
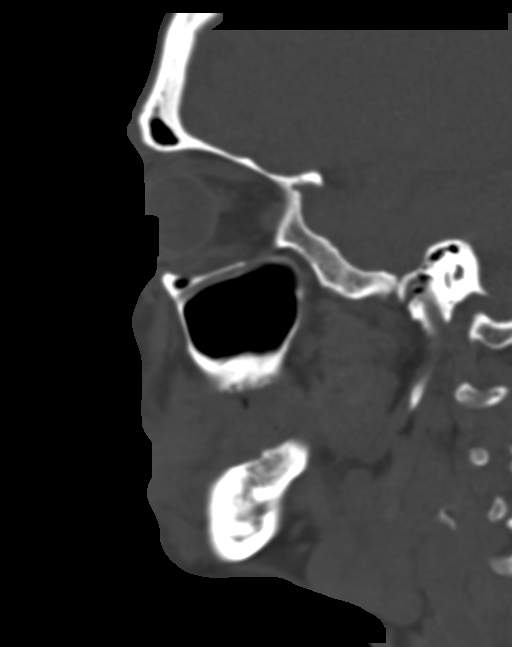
[im 44/88  bone]
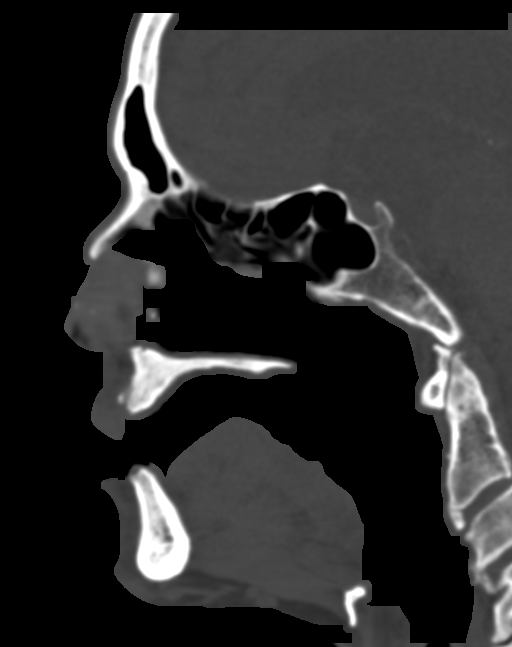
[im 59/88  bone]
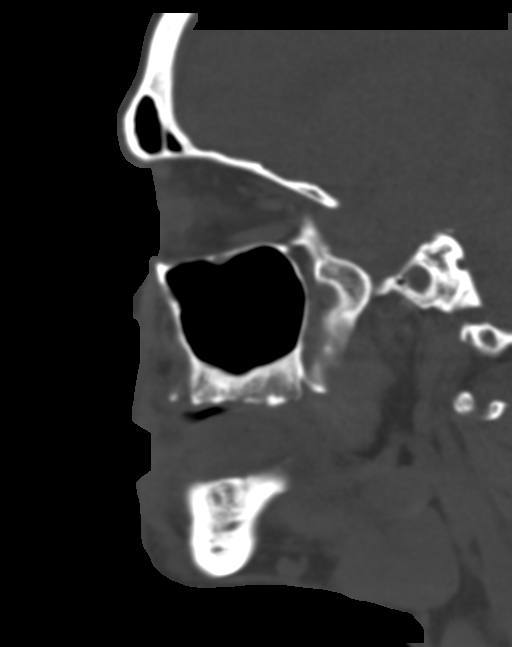

[15 of 47 positions shown; findings below may reference images not displayed]

FINDINGS: Osseous: Depressed and comminuted left nasal bone fracture. The
anterior aspect of the bony nasal septum is also fractured.

The anterior walls of the maxillary sinus are intact. No zygoma
fracture. The mandibular condyles are normally located. No mandible
fracture.

Orbits: No orbital wall fractures are identified. The globes are
intact.

Sinuses: The paranasal sinuses and mastoid air cells are clear.

Soft tissues: No significant soft tissue abnormalities.

Limited intracranial: Age advanced cerebral atrophy and
ventriculomegaly, unchanged since prior brain studies. No acute
intracranial findings.
IMPRESSION: 1. Depressed and comminuted left nasal bone fracture and fracture of
the anterior aspect of the bony nasal septum.
2. No other facial bone fractures are identified.
3. Age advanced cerebral atrophy and ventriculomegaly, unchanged
since prior brain studies.

## 2021-10-22 IMAGING — CR DG HAND COMPLETE 3+V*L*
3 series · 3 of 3 positions shown · non-contrast
Comparison: None.

CLINICAL DATA: Fall, left hand pain

EXAM:
LEFT HAND - COMPLETE 3+ VIEW

[x hand pa left]
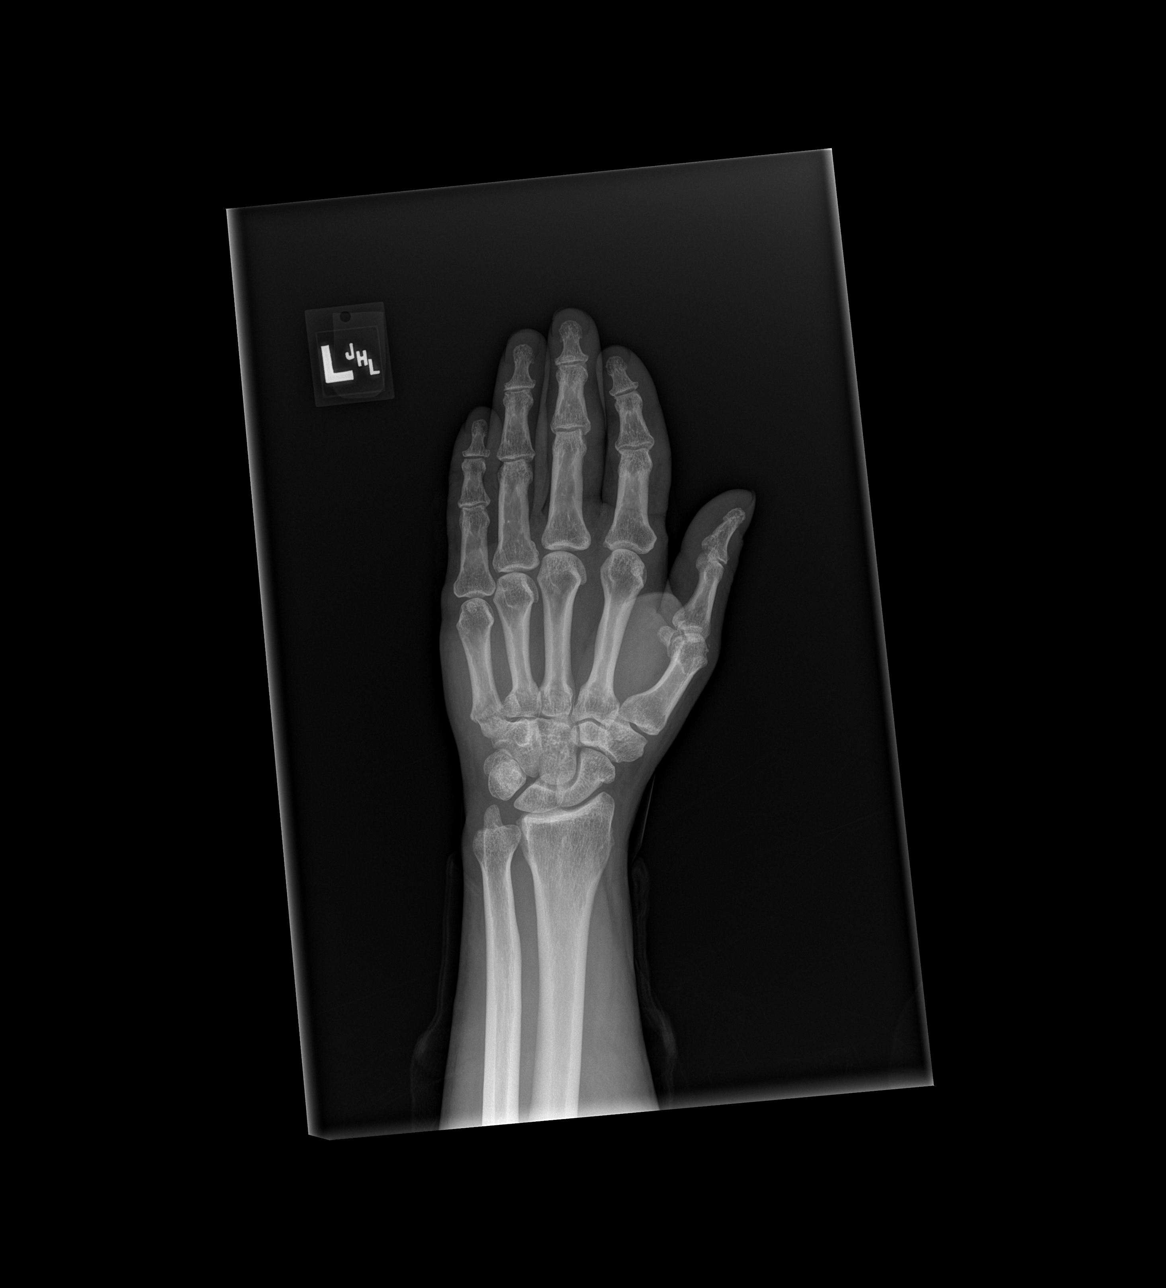

[x hand obl left]
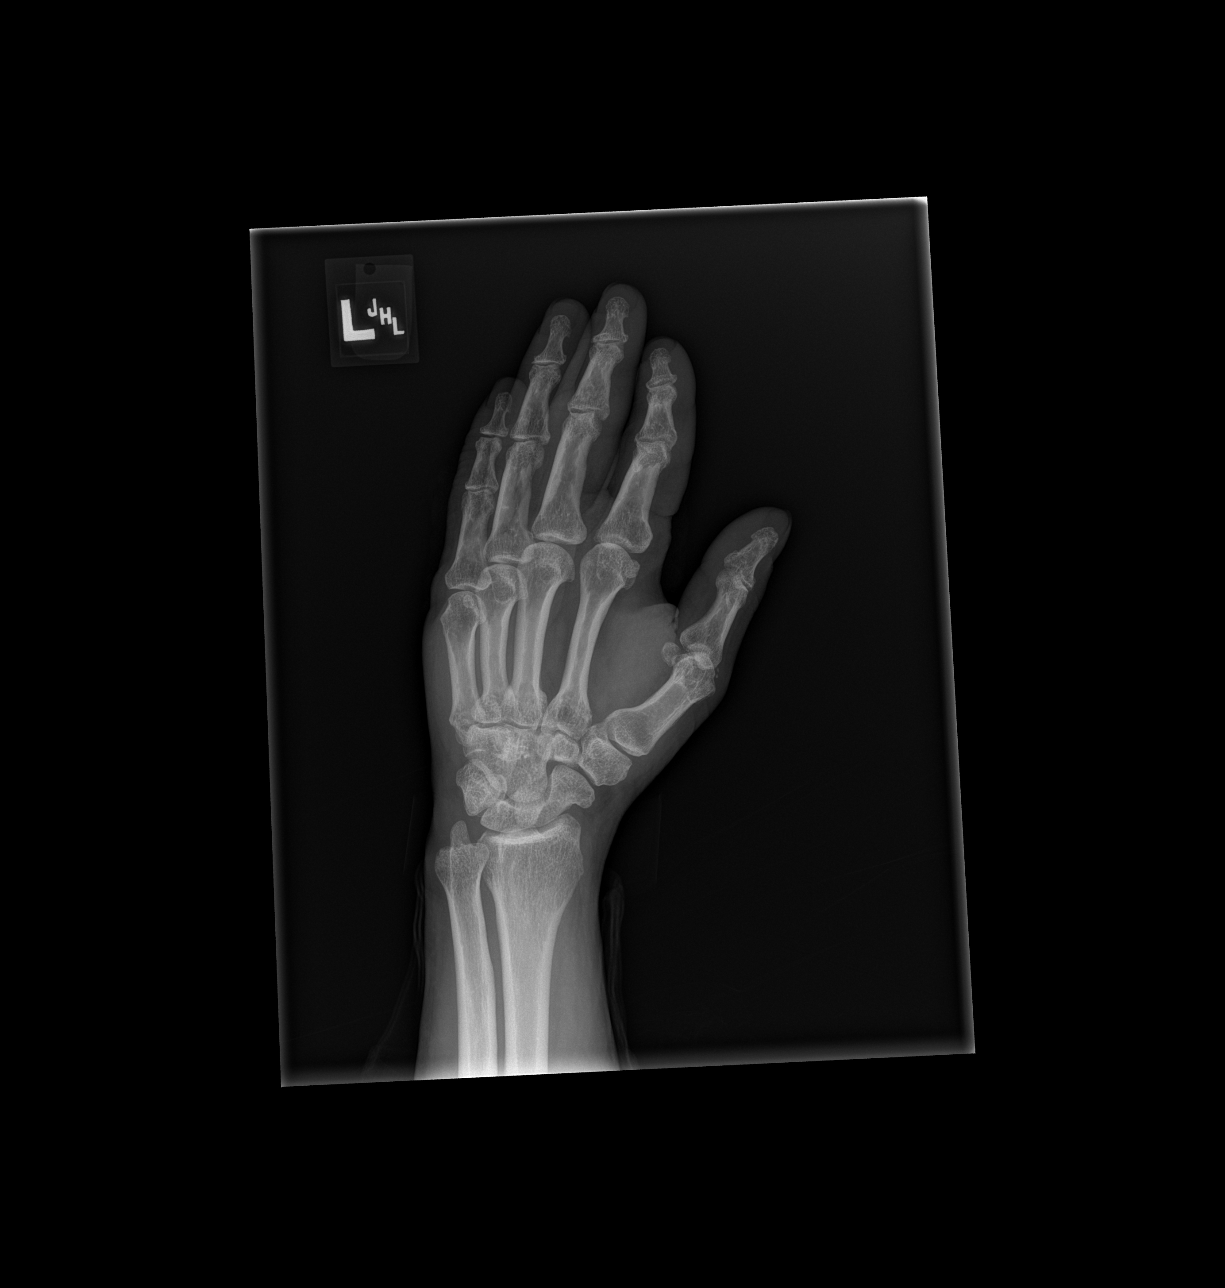

[x hand lat left]
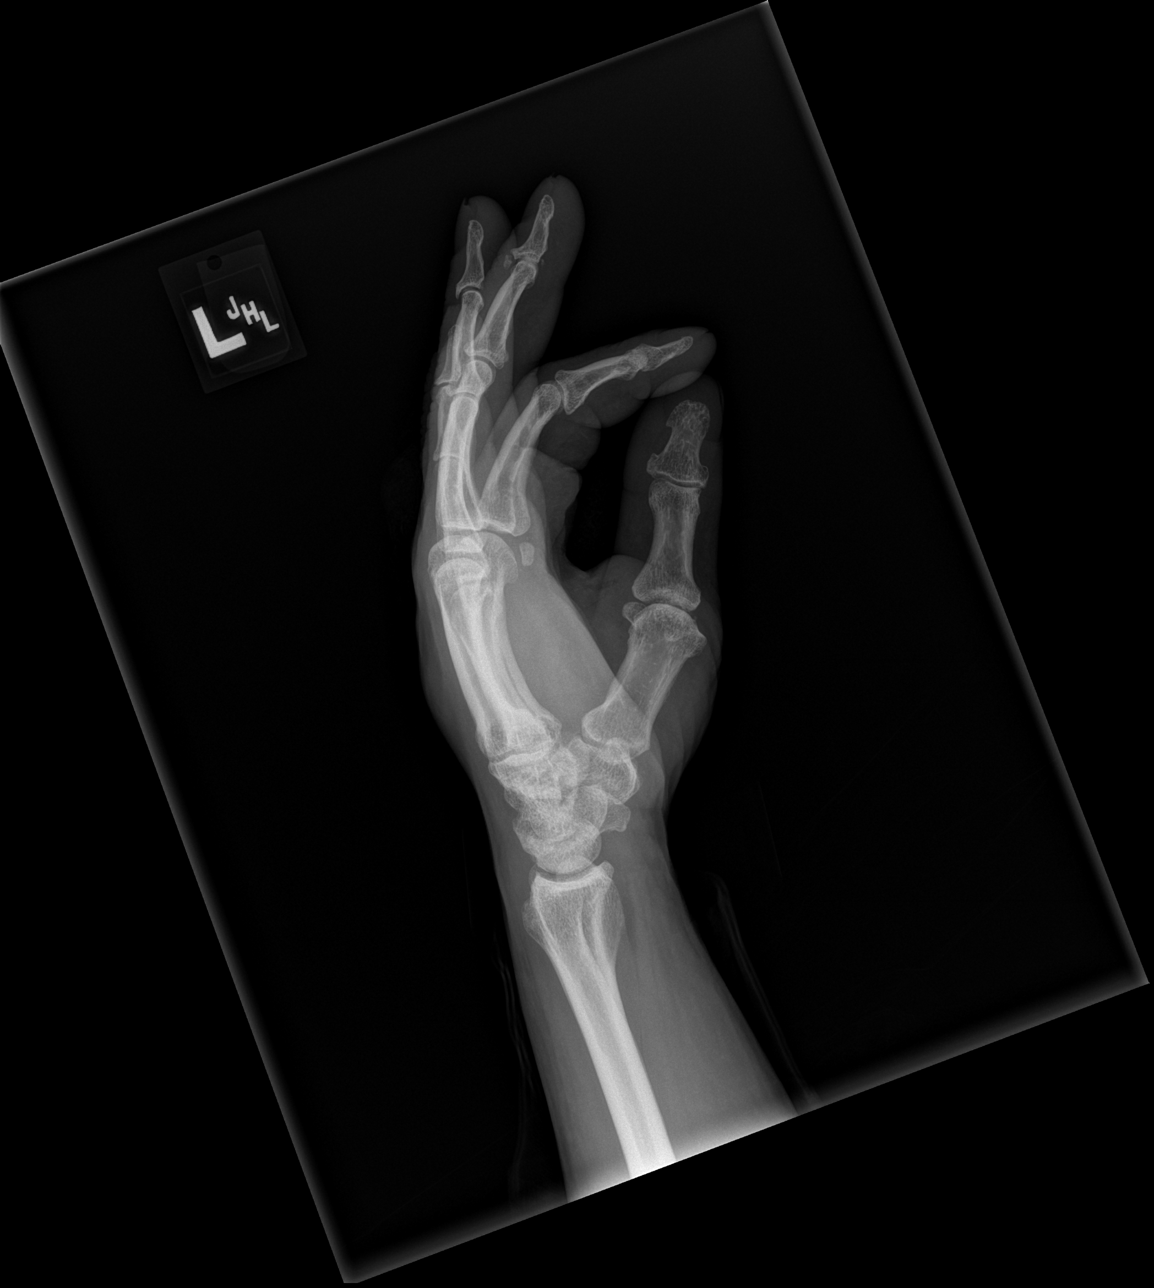

[3 of 3 positions shown; findings below may reference images not displayed]

FINDINGS: Three view radiograph of the a left hand demonstrates normal
alignment. There is a remote appearing fracture deformity of the
base of the left fifth metacarpal. No acute fracture or dislocation.
Remote appearing fracture of an enthesophyte involving the terminal
extensor tendon of the long finger. Moderate soft tissue swelling
noted dorsal to the metacarpals on lateral examination.
IMPRESSION: Soft tissue swelling. No acute fracture or dislocation. Multiple
remote fractures involving the base of the left fifth metacarpal and
an enthesophyte involving the terminal extensor tendon of the long
finger.

## 2021-12-29 DIAGNOSIS — Z1152 Encounter for screening for COVID-19: Secondary | ICD-10-CM | POA: Diagnosis not present

## 2022-07-14 DIAGNOSIS — Z0000 Encounter for general adult medical examination without abnormal findings: Secondary | ICD-10-CM | POA: Diagnosis not present

## 2022-07-14 DIAGNOSIS — Z79899 Other long term (current) drug therapy: Secondary | ICD-10-CM | POA: Diagnosis not present

## 2022-07-14 DIAGNOSIS — Z23 Encounter for immunization: Secondary | ICD-10-CM | POA: Diagnosis not present

## 2022-07-14 DIAGNOSIS — I10 Essential (primary) hypertension: Secondary | ICD-10-CM | POA: Diagnosis not present

## 2022-07-14 DIAGNOSIS — M4802 Spinal stenosis, cervical region: Secondary | ICD-10-CM | POA: Diagnosis not present

## 2022-07-14 DIAGNOSIS — M1990 Unspecified osteoarthritis, unspecified site: Secondary | ICD-10-CM | POA: Diagnosis not present

## 2022-07-14 DIAGNOSIS — Z720 Tobacco use: Secondary | ICD-10-CM | POA: Diagnosis not present

## 2022-07-14 DIAGNOSIS — Z136 Encounter for screening for cardiovascular disorders: Secondary | ICD-10-CM | POA: Diagnosis not present

## 2022-07-21 ENCOUNTER — Other Ambulatory Visit: Payer: Self-pay | Admitting: Family Medicine

## 2022-07-21 DIAGNOSIS — Z79899 Other long term (current) drug therapy: Secondary | ICD-10-CM

## 2022-07-28 DIAGNOSIS — Z0000 Encounter for general adult medical examination without abnormal findings: Secondary | ICD-10-CM | POA: Diagnosis not present

## 2022-07-28 DIAGNOSIS — M1990 Unspecified osteoarthritis, unspecified site: Secondary | ICD-10-CM | POA: Diagnosis not present

## 2022-07-28 DIAGNOSIS — I10 Essential (primary) hypertension: Secondary | ICD-10-CM | POA: Diagnosis not present

## 2022-07-28 DIAGNOSIS — M4802 Spinal stenosis, cervical region: Secondary | ICD-10-CM | POA: Diagnosis not present

## 2022-07-28 DIAGNOSIS — M7989 Other specified soft tissue disorders: Secondary | ICD-10-CM | POA: Diagnosis not present

## 2022-07-28 DIAGNOSIS — Z720 Tobacco use: Secondary | ICD-10-CM | POA: Diagnosis not present

## 2022-08-04 ENCOUNTER — Ambulatory Visit: Payer: Self-pay

## 2023-05-09 NOTE — Congregational Nurse Program (Signed)
  Dept: [phone]   Congregational Nurse Program Note  Date of Encounter: 05/09/2023  Resident came to clinic to ask about medications, states he doesn't know when he las took prescribed medicines.  Reviewed medicines in this chart and gave him information on Wednesday MD clinic at [facility].  BP 157/95, pulse 60 and regular, O2 Sat 99%.  Discussed normal blood pressure levels and the need to take all prescribed medicine regularly. Past Medical History: Past Medical History:  Diagnosis Date   Alcohol use    Hyperbilirubinemia 07/01/2020   Peptic ulcer 08/09/1987   Jul 28, 2008 Entered By: Jeral Pinch Comment: s/p partial gastrectomy    Encounter Details:  CNP Questionnaire - 05/09/23 1000       Questionnaire   Ask client: Do you give verbal consent for me to treat you today? Yes    Student Assistance N/A    Location Patient Served  [facility]    Visit Setting with Client Organization    Patient Status Unhoused    Insurance Medicaid    Insurance/Financial Assistance Referral N/A    Medication Have Medication Insecurities;Provided Medication Assistance    Medical Provider Yes    Screening Referrals Made N/A    Medical Referrals Made [facility]    Medical Appointment Made N/A    Recently w/o PCP, now 1st time PCP visit completed due to CNs referral or appointment made N/A    Food Have Food Insecurities    Transportation Need transportation assistance    Housing/Utilities No permanent housing    Interpersonal Safety Do not feel safe at current residence    Interventions Counsel;Advocate/Support;Navigate Healthcare System;Educate    Abnormal to Normal Screening Since Last CN Visit N/A    Screenings CN Performed Blood Pressure;Weight;Pulse Ox    Sent Client to Lab for: N/A    Did client attend any of the following based off CNs referral or appointments made? N/A    ED Visit Averted N/A    Life-Saving Intervention Made N/A

## 2023-05-10 ENCOUNTER — Encounter: Payer: Self-pay | Admitting: Physician Assistant

## 2023-05-10 ENCOUNTER — Other Ambulatory Visit: Payer: Self-pay

## 2023-05-10 ENCOUNTER — Other Ambulatory Visit: Payer: Self-pay | Admitting: Critical Care Medicine

## 2023-05-10 DIAGNOSIS — B351 Tinea unguium: Secondary | ICD-10-CM

## 2023-05-10 MED ORDER — VALSARTAN-HYDROCHLOROTHIAZIDE 80-12.5 MG PO TABS
1.0000 | ORAL_TABLET | Freq: Every day | ORAL | 3 refills | Status: DC
Start: 2023-05-10 — End: 2023-10-25
  Filled 2023-05-10: qty 90, 90d supply, fill #0

## 2023-05-10 NOTE — Progress Notes (Signed)
Pt not able to climb stairs due to spinal issues.   Last seen here 2022, has been going to the [place] in between.   They had him in an apartment, he would have had an upstairs bedroom/shower. Was unable to do this, came back to the shelter.   He is completely out of all of his medications.   Today's Vitals   05/10/23 1519  BP: (!) 153/76  Pulse: 79  SpO2: 96%   There is no height or weight on file to calculate BMI.  His feet are in terrible shape, he will be referred to [facility].   He has 2+ LE edema.   He denies drug, alcohol use, still smokes.   Pt has been having problems w/ syncope, ?Orthostatic as he says it happens when he stands up and will not happen if he is able to sit back down. He is working on drinking more water, although progression of spinal dz is a more likely cause.   He does not have a phone that works. The appts will go through Dr Delford Field.   Theodore Demark, PA-C 05/10/2023 3:16 PM

## 2023-05-10 NOTE — Progress Notes (Signed)
Med refill

## 2023-05-11 ENCOUNTER — Other Ambulatory Visit: Payer: Self-pay

## 2023-05-11 ENCOUNTER — Other Ambulatory Visit (HOSPITAL_COMMUNITY): Payer: Self-pay

## 2023-05-18 ENCOUNTER — Encounter: Payer: Self-pay | Admitting: Physician Assistant

## 2023-05-18 NOTE — Congregational Nurse Program (Signed)
  Dept: [phone]   Congregational Nurse Program Note  Date of Encounter: 05/16/2023  Clinic visit for complaint of back pain and to check blood pressure.  BP 119/80, pulse 78 and regular, O2 sat 99%.  He is to see MD at clinic on 05/18/2023.  Past Medical History: Past Medical History:  Diagnosis Date   Alcohol use    Hyperbilirubinemia 07/01/2020   Peptic ulcer 08/09/1987   Jul 28, 2008 Entered By: Jeral Pinch Comment: s/p partial gastrectomy    Encounter Details:  CNP Questionnaire - 05/16/23 1255       Questionnaire   Ask client: Do you give verbal consent for me to treat you today? Yes    Student Assistance N/A    Location Patient Served  [facility]    Visit Setting with Client Organization    Patient Status Unhoused    Insurance Medicaid    Insurance/Financial Assistance Referral N/A    Medication Have Medication Insecurities    Medical Provider Yes    Screening Referrals Made N/A    Medical Referrals Made [facility]CP[facility]inic    Medical Appointment Made N/A    Recently w/o PCP, now 1st time PCP visit completed due to CNs referral or appointment made N/A    Food Have Food Insecurities    Transportation Need transportation assistance    Housing/Utilities No permanent housing    Interpersonal Safety Do not feel safe at current residence    Interventions Counsel;Advocate/Support;Navigate Healthcare System;Educate    Abnormal to Normal Screening Since Last CN Visit N/A    Screenings CN Performed Blood Pressure;Pulse Ox    Sent Client to Lab for: N/A    Did client attend any of the following based off CNs referral or appointments made? N/A    ED Visit Averted N/A    Life-Saving Intervention Made N/A

## 2023-05-18 NOTE — Progress Notes (Signed)
Pt seen by Dr Sunnie Nielsen  Pt having problems w/ L shoulder and some on the R.  Pt cannot lift his L elbow.  This has been going on for months. He sometimes has neck pain as well.  Was given ibuprofen and Voltaren gel, instructions on use.   Will need imaging, MRI would be best.   Theodore Demark, PA-C 05/18/2023 10:10 AM

## 2023-10-12 ENCOUNTER — Other Ambulatory Visit (HOSPITAL_COMMUNITY): Payer: Self-pay | Admitting: Emergency Medicine

## 2023-10-12 MED ORDER — MELOXICAM 7.5 MG PO TABS: 7.5000 mg | ORAL_TABLET | Freq: Every day | ORAL | 0 refills | Status: DC

## 2023-10-12 MED ORDER — VALSARTAN-HYDROCHLOROTHIAZIDE 80-12.5 MG PO TABS: 1.0000 | ORAL_TABLET | Freq: Every day | ORAL | 0 refills | Status: DC

## 2023-10-12 NOTE — Progress Notes (Signed)
 [name] seen today for a blood pressure check and also request meds for chronic neck and shoulder pain.  He reports he has not had meds in quite some time.    Pt is awake/alert appropriate Limited ROM of left shoulder (otherwise neurovascular intact, distal pulses intact  Blood pressure - 198/100  Plan to restart Diovan HCT for one month Meloxicam at his request for 2 weeks  Needs f/u in 2 weeks with Dr. Delford Glenn for BP recheck and will need labs.

## 2023-10-18 ENCOUNTER — Encounter: Payer: Self-pay | Admitting: Physician Assistant

## 2023-10-18 NOTE — Progress Notes (Addendum)
 68 yo with hx HTN, cervical stenosis here for follow up.    Meds (valsartan/hydrochlorothiazide and meloxicam) transferred to his preferred pharmacy. Daily diarrhea -> lactose intolerance, trial lactaid Blood pressure improved compared to last visit. 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  Lacretia Nicks, MD  ---------  Pt states he gets diarrhea every am after breakfast Eggs, grits, does not eat the sausage.  If he does not eat the eggs, he does not get diarrhea.   He is lactose intolerant, perhaps they are putting milk in the eggs.   We will get him Lactaid.   BP Readings from Last 3 Encounters:  10/18/23 (!) 143/91  05/18/23 (!) 166/98  05/16/23 119/80   Fingertips are reddened and stiff.  Has spinal stenosis and is very stiff.  Was prescribed valsartan/hydrochlorothiazide 80/12.5 mg for BP and meloxicam 7.5 mg every day x 14 days. Can be refilled if it helps.   However, [facility] on [address], not [address] (where they were sent). Said he could not get the meds.   [facility] and they [facility] meds to [address], pt given the info on where the meds are in writing.   He will go there tomorrow.   Theodore Demark, PA-C 10/18/2023 4:33 PM    Theodore Demark, PA-C 10/18/2023 4:29 PM'

## 2023-10-25 ENCOUNTER — Other Ambulatory Visit: Payer: Self-pay

## 2023-10-25 ENCOUNTER — Encounter: Payer: Self-pay | Admitting: Physician Assistant

## 2023-10-25 ENCOUNTER — Other Ambulatory Visit: Payer: Self-pay | Admitting: Critical Care Medicine

## 2023-10-25 DIAGNOSIS — G8929 Other chronic pain: Secondary | ICD-10-CM

## 2023-10-25 MED ORDER — MELOXICAM 15 MG PO TABS
15.0000 mg | ORAL_TABLET | Freq: Every day | ORAL | 0 refills | Status: DC
  Filled 2023-10-25: qty 30, 30d supply, fill #0

## 2023-10-25 MED ORDER — VALSARTAN-HYDROCHLOROTHIAZIDE 80-12.5 MG PO TABS
1.0000 | ORAL_TABLET | Freq: Every day | ORAL | 1 refills | Status: DC
  Filled 2023-10-25: qty 90, 90d supply, fill #0

## 2023-10-25 NOTE — Progress Notes (Signed)
 ref

## 2023-10-25 NOTE — Progress Notes (Signed)
 Pt still has not picked up the valsartan/hydrochlorothiazide 80/12.5 and meloxicam 15 mg.   No insurance coverage in the system.  Pt says he had 2 insurance cards, lost his wallet and does not have any cards now.   Since he dose not have physical cards and no cards in the system, will send meds to [facility] to pick up.      10/25/2023    2:34 PM 10/18/2023    4:17 PM 05/18/2023   10:09 AM  Vitals with BMI  Systolic 129 143 308  Diastolic 81 91 98  Pulse 60 76 72   L arm has decreased ROM, cannot raise his L arm to horizontal.   Very painful to do any movements that involve raising his L arm.  Has never seen orthopedics. Dr Delford Field will refer.   Has had problems w/ tolerating the eggs at breakfast, believe they had milk in them.   He is getting restaurant food, and has found a health food store.   He avoids dairy, was given Lactaid.   Theodore Demark, PA-C 10/25/2023 2:41 PM

## 2023-10-25 NOTE — Progress Notes (Signed)
Refills on meds 

## 2023-10-26 ENCOUNTER — Encounter: Payer: Self-pay | Admitting: *Deleted

## 2023-10-26 ENCOUNTER — Other Ambulatory Visit: Payer: Self-pay

## 2023-10-26 NOTE — Congregational Nurse Program (Signed)
  Dept: [phone]   Congregational Nurse Program Note  Date of Encounter: 10/26/2023  Past Medical History: Past Medical History:  Diagnosis Date   Alcohol use    Hyperbilirubinemia 07/01/2020   Peptic ulcer 08/09/1987   Jul 28, 2008 Entered By: Jeral Pinch Comment: s/p partial gastrectomy    Encounter Details:  Community Questionnaire - 10/26/23 1459       Questionnaire   Ask client: Do you give verbal consent for me to treat you today? Yes    Student Assistance [employer] Nurse    Location Patient Served  [facility]    Encounter Setting CN site    Population Status Unhoused    Insurance Medicaid    Insurance/Financial Assistance Referral N/A    Medication Have Medication Insecurities;Patient Medications Reviewed;Provided Medication Assistance    Medical Provider Yes    Screening Referrals Made N/A    Medical Referrals Made N/A    Medical Appointment Completed N/A    CNP Interventions Advocate/Support    Screenings CN Performed N/A    ED Visit Averted N/A    Life-Saving Intervention Made N/A           Received notice to pick up medications for client. Picked up medicine at [facility] and brought to client at [facility] Reviewed medication and he acknowledges understanding. Kelia Gibbon W RN CN

## 2023-11-07 NOTE — Congregational Nurse Program (Signed)
  Dept: [phone]   Congregational Nurse Program Note  Date of Encounter: 11/07/2023  Clinic visit to confirm MD appointment date, time and transportation.  States he has been taking medications as prescribed.  BP 142/90, pulse 64 and regular, O2 Sat 99%.  Reviewed medications and educated regarding normal blood pressure levels. Past Medical History: Past Medical History:  Diagnosis Date   Alcohol use    Hyperbilirubinemia 07/01/2020   Peptic ulcer 08/09/1987   Jul 28, 2008 Entered By: Jeral Pinch Comment: s/p partial gastrectomy    Encounter Details:  Community Questionnaire - 11/07/23 1155       Questionnaire   Ask client: Do you give verbal consent for me to treat you today? Yes    Student Assistance N/A    Location Patient Served  [facility]    Encounter Setting CN site    Population Status Unhoused    Insurance Medicaid    Insurance/Financial Assistance Referral N/A    Medication Have Medication Insecurities;Patient Medications Reviewed    Medical Provider Yes    Screening Referrals Made N/A    Medical Referrals Made N/A    Medical Appointment Completed N/A    CNP Interventions Advocate/Support;Counsel;Educate    Screenings CN Performed Blood Pressure    ED Visit Averted N/A    Life-Saving Intervention Made N/A

## 2023-11-15 NOTE — Progress Notes (Deleted)
 Subjective:    Patient ID: Cody Glenn, male    DOB: 07-Jan-1956, 68 y.o.   MRN: [id number]  History of Present Illness: 68 y.o.M here to est PCP 08/24/20 Last seen 07/22/20 at [facility]: This is a 68 year old male with history of heavy alcohol use seen at the [facility].  He has been at the shelter for 1 week.  He has been drinking a quart and a half a day of beer and also actively smokes.  Prior to being in the shelter he was living on the street.  This patient's had several episodes of falling and note he was previously admitted with a significant fall and severe intoxication November 22.  At that time he was found to have significant cervical stenosis and was to have follow-up visit with neurosurgery but this appointment did not appear to occur.   Below is a copy of the discharge summary from that visit   Admit date: 06/29/2020 Discharge date: 07/06/2020   Admitted From: Street Disposition: Street/shelter   Recommendations for Outpatient Follow-up:  1. Follow up with PCP in 1 week 2. Neurosugery/ENT follow-up 3. Please obtain BMP/CBC in one week 4. Please follow up on the following pending results: None   Home Health: None Equipment/Devices: None   Discharge Condition: Stable CODE STATUS: Full code Diet recommendation: Heart healthy    Brief/Interim Summary:   Admission HPI written by Charlsie Quest, MD     Chief Complaint: Fall   HPI: Cody Glenn is a 68 y.o. male with medical history significant for alcohol and tobacco use who presents to the ED for evaluation after a fall.  Patient states he was outside when he was walking along the sidewalk when he tripped over the curb.  He states fell forward onto his face.  He says he thinks he lost consciousness temporarily.  He says he has been feeling lightheaded all day.  He has been having nausea and vomiting and some shortness of breath.  He reports a chronic cough which is unchanged.  He  has not had any chest pain, abdominal pain, dysuria, diarrhea, seizure activity, loss of bowel/bladder control.   Patient does report chronic alcohol use, at least 3 quarts of beer daily with unspecified amount of liquor as well.  He is a chronic smoker of 1 pack/day since age of 78.  He denies any marijuana, cocaine, or IV drug use.  He reports a history of bleeding stomach ulcer requiring surgery over 30 years ago.  He is unaware of any other chronic medical conditions.  He is not currently taking any medications.  [facility]  He is unaware of any medical conditions in his immediate family.     Hospital course:   Hyponatremia Likely chronic. Patient with a fall prior to admission. Sodium of 111 on admission. Initially given a NS bolus given on admission followed by NS at 50 ml/hr with rapid improvement of sodium to 120 within 6 hours. IV fluids discontinued. Sodium continuing to increase with increased dietary intake and is now resolved.   Nasal bone facture Left. Displaced medially. Recommendation for outpatient ENT follow-up   Fall Initially thought secondary to alcohol intoxication but possibly related to hyponatremia as well. OT recommending SNF but PT recommending no PT follow-up.   Alcohol abuse Continues to have low CIWA scores.   Tobacco use Cessation discussed -Continue nicotine patch   Bilateral neuropathic pain  Patient states that this is somewhat new. CT cervical spine significant for moderate-severe C5-C6/C6-C7 stenosis. MRI significant for severe stenosis at C3-4 with moderate stenosis at C5-6 and widespread severe foraminal stenosis. Patient with upper extremity weakness/numbness. Neurosurgery consulted and recommending cervical fusion surgery as an outpatient.   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   Elevated BP Started on clonidine. Patient left prior to receiving recommendations for prescriptions.    Hyperbilirubinemia Likely related alcohol use. AST of 43 in setting of alcohol use. RUQ ultrasound with no evidence of cirrhosis   Hypokalemia Supplementation   Discharge Diagnoses:  Principal Problem:   Hyponatremia Active Problems:   Alcohol use   Tobacco use   Malnutrition of moderate degree   Hyperbilirubinemia   Hypokalemia   This patient had another fall yesterday and was sent to the emergency room where he was evaluated the CT scan of the head and facial CT scan showing nasal fracture anteriorly but no bleeding in the brain.  Note his sodium at that visit was 141 and had resolved in terms of hyponatremia.  He has had one episode of nausea and vomiting today.  He states he does have a slight degree of headache in the right ear area.  He also complains of weakness in both shoulders and pain in both shoulders and he has very unsteady gait.  He has no abdominal pain or shortness of breath.   On exam blood pressure is 136/76 pulse 94 saturation 100% room air chest was clear cardiac exam unremarkable abdomen benign the patient has a very unsteady gait.   Impression is that of cervical stenosis which needs neurosurgical orthopedic spine evaluation ASAP #2 diarrhea status post heavy alcohol use #3 hyponatremia which is resolved   Plan for this patient is received thiamine folic acid multivitamin supplementation #2 we will get a referral to neurosurgery ASAP #3 we will obtain a walker for the patient #4 get this patient access to a [facility] clinic  Pt had no show visit 08/24/20 due to snow  10/13/2020 This is the first official face-to-face visit in the clinic the first visit in January was a telephone visit because of the weather.  Patient states he is reduced his alcohol consumption considerably at this time.  He is drinking only 1 beer a day now.  On arrival blood pressure was 156/89.  He denies any recent falls.  He does use a Rollator to ambulate.  Still has some neck stiffness.  He  is working on a plan we may move out of the shelter in with his daughter.  He is yet to see the spine surgeon because he does not have the orange card as of yet.  He still waiting on getting the tax information.  No other complaints at this time.  Patient does maintain thiamine folic acid supplementation.  11/16/23      Past Medical History:  Diagnosis Date   Alcohol use    Hyperbilirubinemia 07/01/2020   Peptic ulcer 08/09/1987   Jul 28, 2008 Entered By: Jeral Pinch Comment: s/p partial gastrectomy     No family history on file.   Social History   Socioeconomic History   Marital status: Single    Spouse name: Not on file   Number of children: Not on file   Years of education: Not on file   Highest education level: Not on file  Occupational History   Not on file  Tobacco Use   Smoking status: Every Day  Current packs/day: 1.00    Average packs/day: 1 pack/day for 40.0 years (40.0 ttl pk-yrs)    Types: Cigarettes   Smokeless tobacco: Never  Vaping Use   Vaping status: Never Used  Substance and Sexual Activity   Alcohol use: Yes    Comment: "lots" on 06/29/20   Drug use: Not Currently   Sexual activity: Not on file  Other Topics Concern   Not on file  Social History Narrative   Not on file   Social Drivers of Health   Financial Resource Strain: Not on file  Food Insecurity: Not on file  Transportation Needs: Not on file  Physical Activity: Not on file  Stress: Not on file  Social Connections: Not on file  Intimate Partner Violence: Not on file     No Known Allergies   Outpatient Medications Prior to Visit  Medication Sig Dispense Refill   folic acid (FOLVITE) 1 MG tablet Take 1 tablet (1 mg total) by mouth daily. 60 tablet 1   meloxicam (MOBIC) 15 MG tablet Take 1 tablet (15 mg total) by mouth daily. 30 tablet 0   thiamine 100 MG tablet Take 1 tablet (100 mg total) by mouth daily. 60 tablet 2   valsartan-hydrochlorothiazide (DIOVAN HCT) 80-12.5  MG tablet Take 1 tablet by mouth daily. 90 tablet 1   No facility-administered medications prior to visit.    Review of Systems  HENT: Negative.    Respiratory: Negative.    Cardiovascular: Negative.   Gastrointestinal:  Positive for nausea and vomiting.  Genitourinary: Negative.   Musculoskeletal:  Positive for back pain, gait problem and neck pain.       Objective:   Physical Exam There were no vitals filed for this visit.   Gen: Pleasant, well-nourished, in no distress,  normal affect  ENT: No lesions,  mouth clear,  oropharynx clear, no postnasal drip  Neck: No JVD, no TMG, no carotid bruits  Lungs: No use of accessory muscles, no dullness to percussion, clear without rales or rhonchi  Cardiovascular: RRR, heart sounds normal, no murmur or gallops, no peripheral edema  Abdomen: soft and NT, no HSM,  BS normal  Musculoskeletal: No deformities, no cyanosis or clubbing  Neuro: alert, non focal, left lower extremity 4/5 strength right lower extremity 5/5  Skin: Warm, no lesions or rashes  No results found.        Assessment & Plan:  I personally reviewed all images and lab data in the [facility] system as well as any outside material available during this office visit and agree with the  radiology impressions.   No problem-specific Assessment & Plan notes found for this encounter.   There are no diagnoses linked to this encounter. Check hepatitis C assay  Patient is fully vaccinated with flu vaccine and also COVID vaccines

## 2023-11-16 ENCOUNTER — Telehealth: Payer: Self-pay | Admitting: Critical Care Medicine

## 2024-01-02 ENCOUNTER — Other Ambulatory Visit: Payer: Self-pay

## 2024-01-02 ENCOUNTER — Telehealth: Payer: Self-pay | Admitting: Critical Care Medicine

## 2024-01-02 MED ORDER — MELOXICAM 15 MG PO TABS
15.0000 mg | ORAL_TABLET | Freq: Every day | ORAL | 1 refills | Status: DC
  Filled 2024-01-02: qty 60, 60d supply, fill #0

## 2024-01-02 NOTE — Congregational Nurse Program (Signed)
  Dept: [phone]   Congregational Nurse Program Note  Date of Encounter: 01/02/2024  Resident seen by Dr. Brent Cambric for renewal of prescription for Meloxicam . Past Medical History: Past Medical History:  Diagnosis Date   Alcohol use    Hyperbilirubinemia 07/01/2020   Peptic ulcer 08/09/1987   Jul 28, 2008 Entered By: Gideon Kussmaul Comment: s/p partial gastrectomy    Encounter Details:  Community Questionnaire - 01/02/24 1130       Questionnaire   Ask client: Do you give verbal consent for me to treat you today? Yes    Student Assistance N/A    Location Patient Served  [facility]    Encounter Setting CN site    Population Status Unhoused    Insurance Medicaid    Insurance/Financial Assistance Referral N/A    Medication Have Medication Insecurities;Patient Medications Reviewed;Provided Medication Assistance    Medical Provider Yes    Screening Referrals Made N/A    Medical Referrals Made N/A    Medical Appointment Completed N/A    CNP Interventions Advocate/Support;Counsel    Screenings CN Performed N/A    ED Visit Averted N/A    Life-Saving Intervention Made N/A

## 2024-01-02 NOTE — Telephone Encounter (Signed)
 Received call from the patient needing refills on meloxicam   I sent the refills in for the patient  Carly please get this patient into my clinic I am running on June 19  give me date and time  as a Haematologist message

## 2024-01-03 ENCOUNTER — Other Ambulatory Visit: Payer: Self-pay

## 2024-01-04 ENCOUNTER — Encounter: Payer: Self-pay | Admitting: *Deleted

## 2024-01-04 ENCOUNTER — Other Ambulatory Visit: Payer: Self-pay

## 2024-01-04 NOTE — Congregational Nurse Program (Signed)
  Dept: [phone]   Congregational Nurse Program Note  Date of Encounter: 01/04/2024  Past Medical History: Past Medical History:  Diagnosis Date   Alcohol use    Hyperbilirubinemia 07/01/2020   Peptic ulcer 08/09/1987   Jul 28, 2008 Entered By: Gideon Kussmaul Comment: s/p partial gastrectomy    Encounter Details:  Community Questionnaire - 01/04/24 1450       Questionnaire   Ask client: Do you give verbal consent for me to treat you today? Yes    Student Assistance N/A    Location Patient Served  [facility]    Encounter Setting CN site    Population Status Unhoused    Insurance Medicaid    Insurance/Financial Assistance Referral N/A    Medication Have Medication Insecurities;Patient Medications Reviewed;Provided Medication Assistance    Medical Provider Yes    Screening Referrals Made N/A    Medical Referrals Made N/A    Medical Appointment Completed N/A    CNP Interventions Advocate/Support;Case Management    Screenings CN Performed N/A    ED Visit Averted N/A    Life-Saving Intervention Made N/A            Medication picked up [facility] and brought to client at [facility] per MD request. Modesto Andreas RN CN

## 2024-01-04 NOTE — Telephone Encounter (Signed)
 Noted appointment made and sent to [facility](staff message)

## 2024-01-21 DIAGNOSIS — Z599 Problem related to housing and economic circumstances, unspecified: Secondary | ICD-10-CM | POA: Insufficient documentation

## 2024-01-21 DIAGNOSIS — R7303 Prediabetes: Secondary | ICD-10-CM | POA: Insufficient documentation

## 2024-01-21 DIAGNOSIS — M1990 Unspecified osteoarthritis, unspecified site: Secondary | ICD-10-CM | POA: Insufficient documentation

## 2024-01-21 NOTE — Progress Notes (Deleted)
 Subjective:    Patient ID: Cody Glenn, male    DOB: 04/24/1956, 68 y.o.   MRN: [id number]  History of Present Illness: 68 y.o.M here to est PCP 08/24/20 Last seen 07/22/20 at [facility]: This is a 68 year old male with history of heavy alcohol use seen at the [facility].  He has been at the shelter for 1 week.  He has been drinking a quart and a half a day of beer and also actively smokes.  Prior to being in the shelter he was living on the street.  This patient's had several episodes of falling and note he was previously admitted with a significant fall and severe intoxication November 22.  At that time he was found to have significant cervical stenosis and was to have follow-up visit with neurosurgery but this appointment did not appear to occur.   Below is a copy of the discharge summary from that visit  Admit date:06/29/2020 Discharge date:07/06/2020  Admitted From:Street Disposition:Street/shelter  Recommendations for Outpatient Follow-up: 1. Follow up with PCP in 1 week 2. Neurosugery/ENT follow-up 3. Please obtain BMP/CBC in one week 4. Please follow up on the following pending results:None  Home Health:None Equipment/Devices:None  Discharge Condition:Stable CODE STATUS:Full code Diet recommendation:Heart healthy  Brief/Interim Summary:  Admission HPI written byVishal Kelvin Pattee, MD   Chief Complaint:Fall  HPI: Cody Seaman Hollandis a 68 y.o.malewith medical history significant foralcohol and tobacco use who presents to the ED for evaluation after a fall.Patient states he was outside when he was walking along the sidewalk when he tripped over the curb. He states fell forward onto his face. He says he thinks he lost consciousness temporarily. He says he has been feeling lightheaded all day. He has been having nausea and vomiting and some shortness of breath. He reports a chronic cough which is unchanged. He  has not had any chest pain, abdominal pain, dysuria, diarrhea, seizure activity, loss of bowel/bladder control.  Patient does report chronic alcohol use, at least 3 quarts of beer daily with unspecified amount of liquor as well. He is a chronic smoker of 1 pack/day since age of 89. He denies any marijuana, cocaine, or IV drug use. He reports a history of bleeding stomach ulcer requiring surgery over 30 years ago. He is unaware of any other chronic medical conditions. He is not currently taking any medications. He [facility]. He is unaware of any medical conditions in his immediate family.   Hospital course:  Hyponatremia Likely chronic. Patient with a fall prior to admission. Sodium of 111 on admission. Initially given a NS bolus given on admission followed by NS at 50 ml/hr with rapid improvement of sodium to 120 within 6 hours. IV fluids discontinued. Sodium continuing to increase with increased dietary intake and isnow resolved.  Nasal bone facture Left. Displaced medially. Recommendation for outpatient ENT follow-up  Fall Initially thought secondary to alcohol intoxication but possibly related to hyponatremia as well. OT recommending SNF but PT recommending no PT follow-up.  Alcohol abuse Continues to have low CIWA scores.  Tobacco use Cessation discussed -Continue nicotine  patch  Bilateral neuropathic pain Patient states that this is somewhat new. CT cervical spine significant for moderate-severe C5-C6/C6-C7 stenosis. MRI significant for severe stenosis at C3-4 with moderate stenosis at C5-6 and widespread severe foraminal stenosis. Patient with upper extremity weakness/numbness.Neurosurgery consulted and recommending cervical fusion surgery as an outpatient.  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  Elevated BP Started on clonidine . Patient left prior to receiving recommendations for  prescriptions.  Hyperbilirubinemia Likely related alcohol use. AST of 43 in setting of alcohol use. RUQ ultrasound with no evidence of cirrhosis  Hypokalemia Supplementation  Discharge Diagnoses: Principal Problem: Hyponatremia Active Problems: Alcohol use Tobacco use Malnutrition of moderate degree Hyperbilirubinemia Hypokalemia  This patient had another fall yesterday and was sent to the emergency room where he was evaluated the CT scan of the head and facial CT scan showing nasal fracture anteriorly but no bleeding in the brain.  Note his sodium at that visit was 141 and had resolved in terms of hyponatremia.  He has had one episode of nausea and vomiting today.  He states he does have a slight degree of headache in the right ear area.  He also complains of weakness in both shoulders and pain in both shoulders and he has very unsteady gait.  He has no abdominal pain or shortness of breath.  On exam blood pressure is 136/76 pulse 94 saturation 100% room air chest was clear cardiac exam unremarkable abdomen benign the patient has a very unsteady gait.  Impression is that of cervical stenosis which needs neurosurgical orthopedic spine evaluation ASAP #2 diarrhea status post heavy alcohol use #3 hyponatremia which is resolved  Plan for this patient is received thiamine  folic acid  multivitamin supplementation #2 we will get a referral to neurosurgery ASAP #3 we will obtain a walker for the patient #4 get this patient access to a [facility] clinic  Pt had no show visit 08/24/20 due to snow  10/13/2020 This is the first official face-to-face visit in the clinic the first visit in January was a telephone visit because of the weather.  Patient states he is reduced his alcohol consumption considerably at this time.  He is drinking only 1 beer a day now.  On arrival blood pressure was 156/89.  He denies any recent falls.  He does use a Rollator to ambulate.  Still has some neck  stiffness.  He is working on a plan we may move out of the shelter in with his daughter.  He is yet to see the spine surgeon because he does not have the orange card as of yet.  He still waiting on getting the tax information.  No other complaints at this time.  Patient does maintain thiamine  folic acid  supplementation.  01/25/24  Not seen since 2022 [facility] 10/2023 Pt still has not picked up the valsartan /hydrochlorothiazide  80/12.5 and meloxicam  15 mg.    No insurance coverage in the system.   Pt says he had 2 insurance cards, lost his wallet and does not have any cards now.    Since he dose not have physical cards and no cards in the system, will send meds to [facility] >> Jan to pick up.        10/25/2023    2:34 PM 10/18/2023    4:17 PM 05/18/2023   10:09 AM Vitals with BMI Systolic 129 143 295 Diastolic 81 91 98 Pulse 60 76 72   L arm has decreased ROM, cannot raise his L arm to horizontal.    Very painful to do any movements that involve raising his L arm.   Has never seen orthopedics. Dr Brent Cambric will refer.    Has had problems w/ tolerating the eggs at breakfast, believe they had milk in them.    He is getting restaurant food, and has found a health food store.  Past Medical History:  Diagnosis Date  . Alcohol use   . Hyperbilirubinemia 07/01/2020  . Peptic ulcer 08/09/1987   Jul 28, 2008 Entered By: Gideon Kussmaul Comment: s/p partial gastrectomy     No family history on file.   Social History   Socioeconomic History  . Marital status: Single    Spouse name: Not on file  . Number of children: Not on file  . Years of education: Not on file  . Highest education level: Not on file  Occupational History  . Not on file  Tobacco Use  . Smoking status: Every Day    Current packs/day: 1.00    Average packs/day: 1 pack/day for 40.0 years (40.0 ttl pk-yrs)    Types: Cigarettes  . Smokeless tobacco: Never  Vaping Use  . Vaping status: Never Used   Substance and Sexual Activity  . Alcohol use: Yes    Comment: lots on 06/29/20  . Drug use: Not Currently  . Sexual activity: Not on file  Other Topics Concern  . Not on file  Social History Narrative  . Not on file   Social Drivers of Health   Financial Resource Strain: Not on file  Food Insecurity: Not on file  Transportation Needs: Not on file  Physical Activity: Not on file  Stress: Not on file  Social Connections: Not on file  Intimate Partner Violence: Not on file     No Known Allergies   Outpatient Medications Prior to Visit  Medication Sig Dispense Refill  . folic acid  (FOLVITE ) 1 MG tablet Take 1 tablet (1 mg total) by mouth daily. 60 tablet 1  . meloxicam  (MOBIC ) 15 MG tablet Take 1 tablet (15 mg total) by mouth daily. 60 tablet 1  . thiamine  100 MG tablet Take 1 tablet (100 mg total) by mouth daily. 60 tablet 2  . valsartan -hydrochlorothiazide  (DIOVAN  HCT) 80-12.5 MG tablet Take 1 tablet by mouth daily. 90 tablet 1   No facility-administered medications prior to visit.    Review of Systems  HENT: Negative.    Respiratory: Negative.    Cardiovascular: Negative.   Gastrointestinal:  Positive for nausea and vomiting.  Genitourinary: Negative.   Musculoskeletal:  Positive for back pain, gait problem and neck pain.       Objective:   Physical Exam There were no vitals filed for this visit.   Gen: Pleasant, well-nourished, in no distress,  normal affect  ENT: No lesions,  mouth clear,  oropharynx clear, no postnasal drip  Neck: No JVD, no TMG, no carotid bruits  Lungs: No use of accessory muscles, no dullness to percussion, clear without rales or rhonchi  Cardiovascular: RRR, heart sounds normal, no murmur or gallops, no peripheral edema  Abdomen: soft and NT, no HSM,  BS normal  Musculoskeletal: No deformities, no cyanosis or clubbing  Neuro: alert, non focal, left lower extremity 4/5 strength right lower extremity 5/5  Skin: Warm, no lesions  or rashes  No results found.        Assessment & Plan:  I personally reviewed all images and lab data in the [facility] system as well as any outside material available during this office visit and agree with the  radiology impressions.   No problem-specific Assessment & Plan notes found for this encounter.   There are no diagnoses linked to this encounter. Check hepatitis C assay  Patient is fully vaccinated with flu vaccine and also COVID vaccines

## 2024-01-25 ENCOUNTER — Ambulatory Visit: Payer: Self-pay | Admitting: Critical Care Medicine

## 2024-02-20 ENCOUNTER — Other Ambulatory Visit: Payer: Self-pay

## 2024-02-20 ENCOUNTER — Emergency Department (HOSPITAL_COMMUNITY): Payer: MEDICAID

## 2024-02-20 ENCOUNTER — Inpatient Hospital Stay (HOSPITAL_COMMUNITY)
Admission: EM | Admit: 2024-02-20 | Discharge: 2024-02-28 | DRG: 166 | Disposition: A | Discharge status: Home / Self Care | Payer: MEDICAID | Attending: Internal Medicine | Admitting: Internal Medicine

## 2024-02-20 ENCOUNTER — Inpatient Hospital Stay (HOSPITAL_COMMUNITY): Payer: MEDICAID

## 2024-02-20 DIAGNOSIS — Z6825 Body mass index (BMI) 25.0-25.9, adult: Secondary | ICD-10-CM | POA: Diagnosis not present

## 2024-02-20 DIAGNOSIS — I82513 Chronic embolism and thrombosis of femoral vein, bilateral: Secondary | ICD-10-CM | POA: Diagnosis not present

## 2024-02-20 DIAGNOSIS — K7460 Unspecified cirrhosis of liver: Secondary | ICD-10-CM | POA: Diagnosis not present

## 2024-02-20 DIAGNOSIS — R609 Edema, unspecified: Secondary | ICD-10-CM | POA: Diagnosis not present

## 2024-02-20 DIAGNOSIS — I2510 Atherosclerotic heart disease of native coronary artery without angina pectoris: Secondary | ICD-10-CM | POA: Diagnosis present

## 2024-02-20 DIAGNOSIS — G8929 Other chronic pain: Secondary | ICD-10-CM | POA: Diagnosis not present

## 2024-02-20 DIAGNOSIS — I712 Thoracic aortic aneurysm, without rupture, unspecified: Secondary | ICD-10-CM | POA: Diagnosis not present

## 2024-02-20 DIAGNOSIS — R7303 Prediabetes: Secondary | ICD-10-CM | POA: Diagnosis not present

## 2024-02-20 DIAGNOSIS — B479 Mycetoma, unspecified: Secondary | ICD-10-CM | POA: Diagnosis not present

## 2024-02-20 DIAGNOSIS — R0902 Hypoxemia: Secondary | ICD-10-CM | POA: Diagnosis not present

## 2024-02-20 DIAGNOSIS — Y901 Blood alcohol level of 20-39 mg/100 ml: Secondary | ICD-10-CM | POA: Diagnosis present

## 2024-02-20 DIAGNOSIS — F1020 Alcohol dependence, uncomplicated: Secondary | ICD-10-CM | POA: Diagnosis not present

## 2024-02-20 DIAGNOSIS — J9601 Acute respiratory failure with hypoxia: Secondary | ICD-10-CM | POA: Diagnosis not present

## 2024-02-20 DIAGNOSIS — R911 Solitary pulmonary nodule: Secondary | ICD-10-CM | POA: Diagnosis not present

## 2024-02-20 DIAGNOSIS — F1090 Alcohol use, unspecified, uncomplicated: Secondary | ICD-10-CM | POA: Diagnosis present

## 2024-02-20 DIAGNOSIS — I82403 Acute embolism and thrombosis of unspecified deep veins of lower extremity, bilateral: Secondary | ICD-10-CM | POA: Diagnosis present

## 2024-02-20 DIAGNOSIS — Z48813 Encounter for surgical aftercare following surgery on the respiratory system: Secondary | ICD-10-CM | POA: Diagnosis not present

## 2024-02-20 DIAGNOSIS — Z791 Long term (current) use of non-steroidal anti-inflammatories (NSAID): Secondary | ICD-10-CM

## 2024-02-20 DIAGNOSIS — E441 Mild protein-calorie malnutrition: Secondary | ICD-10-CM | POA: Diagnosis not present

## 2024-02-20 DIAGNOSIS — I700 Atherosclerosis of aorta: Secondary | ICD-10-CM | POA: Diagnosis not present

## 2024-02-20 DIAGNOSIS — E871 Hypo-osmolality and hyponatremia: Principal | ICD-10-CM | POA: Diagnosis present

## 2024-02-20 DIAGNOSIS — J189 Pneumonia, unspecified organism: Secondary | ICD-10-CM | POA: Diagnosis not present

## 2024-02-20 DIAGNOSIS — J984 Other disorders of lung: Secondary | ICD-10-CM | POA: Diagnosis not present

## 2024-02-20 DIAGNOSIS — R0609 Other forms of dyspnea: Secondary | ICD-10-CM | POA: Diagnosis not present

## 2024-02-20 DIAGNOSIS — Z5901 Sheltered homelessness: Secondary | ICD-10-CM | POA: Diagnosis not present

## 2024-02-20 DIAGNOSIS — R042 Hemoptysis: Secondary | ICD-10-CM | POA: Diagnosis not present

## 2024-02-20 DIAGNOSIS — D649 Anemia, unspecified: Secondary | ICD-10-CM | POA: Diagnosis present

## 2024-02-20 DIAGNOSIS — K7689 Other specified diseases of liver: Secondary | ICD-10-CM | POA: Diagnosis not present

## 2024-02-20 DIAGNOSIS — F17210 Nicotine dependence, cigarettes, uncomplicated: Secondary | ICD-10-CM | POA: Diagnosis present

## 2024-02-20 DIAGNOSIS — S2232XA Fracture of one rib, left side, initial encounter for closed fracture: Secondary | ICD-10-CM | POA: Diagnosis not present

## 2024-02-20 DIAGNOSIS — M25512 Pain in left shoulder: Secondary | ICD-10-CM | POA: Diagnosis not present

## 2024-02-20 DIAGNOSIS — J439 Emphysema, unspecified: Secondary | ICD-10-CM | POA: Diagnosis present

## 2024-02-20 DIAGNOSIS — S2241XA Multiple fractures of ribs, right side, initial encounter for closed fracture: Secondary | ICD-10-CM | POA: Diagnosis not present

## 2024-02-20 DIAGNOSIS — Q2739 Arteriovenous malformation, other site: Secondary | ICD-10-CM

## 2024-02-20 DIAGNOSIS — I82533 Chronic embolism and thrombosis of popliteal vein, bilateral: Secondary | ICD-10-CM | POA: Diagnosis not present

## 2024-02-20 DIAGNOSIS — Z720 Tobacco use: Secondary | ICD-10-CM | POA: Diagnosis present

## 2024-02-20 DIAGNOSIS — M4804 Spinal stenosis, thoracic region: Secondary | ICD-10-CM | POA: Diagnosis not present

## 2024-02-20 DIAGNOSIS — K7030 Alcoholic cirrhosis of liver without ascites: Secondary | ICD-10-CM | POA: Diagnosis present

## 2024-02-20 DIAGNOSIS — I10 Essential (primary) hypertension: Secondary | ICD-10-CM | POA: Diagnosis not present

## 2024-02-20 DIAGNOSIS — M47816 Spondylosis without myelopathy or radiculopathy, lumbar region: Secondary | ICD-10-CM | POA: Diagnosis present

## 2024-02-20 DIAGNOSIS — M7989 Other specified soft tissue disorders: Secondary | ICD-10-CM | POA: Diagnosis not present

## 2024-02-20 DIAGNOSIS — I7121 Aneurysm of the ascending aorta, without rupture: Secondary | ICD-10-CM | POA: Diagnosis present

## 2024-02-20 DIAGNOSIS — J90 Pleural effusion, not elsewhere classified: Secondary | ICD-10-CM | POA: Diagnosis not present

## 2024-02-20 DIAGNOSIS — R0989 Other specified symptoms and signs involving the circulatory and respiratory systems: Secondary | ICD-10-CM | POA: Diagnosis not present

## 2024-02-20 DIAGNOSIS — R918 Other nonspecific abnormal finding of lung field: Secondary | ICD-10-CM

## 2024-02-20 DIAGNOSIS — Z8711 Personal history of peptic ulcer disease: Secondary | ICD-10-CM

## 2024-02-20 DIAGNOSIS — M47814 Spondylosis without myelopathy or radiculopathy, thoracic region: Secondary | ICD-10-CM | POA: Diagnosis not present

## 2024-02-20 DIAGNOSIS — F1010 Alcohol abuse, uncomplicated: Secondary | ICD-10-CM

## 2024-02-20 DIAGNOSIS — M48061 Spinal stenosis, lumbar region without neurogenic claudication: Secondary | ICD-10-CM | POA: Diagnosis present

## 2024-02-20 DIAGNOSIS — Z903 Acquired absence of stomach [part of]: Secondary | ICD-10-CM

## 2024-02-20 DIAGNOSIS — K838 Other specified diseases of biliary tract: Secondary | ICD-10-CM | POA: Diagnosis not present

## 2024-02-20 DIAGNOSIS — Z79899 Other long term (current) drug therapy: Secondary | ICD-10-CM

## 2024-02-20 DIAGNOSIS — J8410 Pulmonary fibrosis, unspecified: Secondary | ICD-10-CM | POA: Diagnosis not present

## 2024-02-20 DIAGNOSIS — M47817 Spondylosis without myelopathy or radiculopathy, lumbosacral region: Secondary | ICD-10-CM | POA: Diagnosis not present

## 2024-02-20 DIAGNOSIS — J929 Pleural plaque without asbestos: Secondary | ICD-10-CM | POA: Diagnosis not present

## 2024-02-20 DIAGNOSIS — Z716 Tobacco abuse counseling: Secondary | ICD-10-CM

## 2024-02-20 LAB — CBC WITH DIFFERENTIAL/PLATELET
Abs Immature Granulocytes: 0.14 K/uL — ABNORMAL HIGH (ref 0.00–0.07)
Basophils Absolute: 0.1 K/uL (ref 0.0–0.1)
Basophils Relative: 1 %
Eosinophils Absolute: 0.1 K/uL (ref 0.0–0.5)
Eosinophils Relative: 1 %
HCT: 29.8 % — ABNORMAL LOW (ref 39.0–52.0)
Hemoglobin: 10.4 g/dL — ABNORMAL LOW (ref 13.0–17.0)
Immature Granulocytes: 1 %
Lymphocytes Relative: 11 %
Lymphs Abs: 1.2 K/uL (ref 0.7–4.0)
MCH: 31 pg (ref 26.0–34.0)
MCHC: 34.9 g/dL (ref 30.0–36.0)
MCV: 88.7 fL (ref 80.0–100.0)
Monocytes Absolute: 0.9 K/uL (ref 0.1–1.0)
Monocytes Relative: 9 %
Neutro Abs: 8.3 K/uL — ABNORMAL HIGH (ref 1.7–7.7)
Neutrophils Relative %: 77 %
Platelets: 296 K/uL (ref 150–400)
RBC: 3.36 MIL/uL — ABNORMAL LOW (ref 4.22–5.81)
RDW: 14.6 % (ref 11.5–15.5)
WBC: 10.8 K/uL — ABNORMAL HIGH (ref 4.0–10.5)
nRBC: 0 % (ref 0.0–0.2)

## 2024-02-20 LAB — BASIC METABOLIC PANEL WITH GFR
Anion gap: 8 (ref 5–15)
Anion gap: 12 (ref 5–15)
BUN: 6 mg/dL — ABNORMAL LOW (ref 8–23)
BUN: 7 mg/dL — ABNORMAL LOW (ref 8–23)
CO2: 25 mmol/L (ref 22–32)
CO2: 22 mmol/L (ref 22–32)
Calcium: 8.2 mg/dL — ABNORMAL LOW (ref 8.9–10.3)
Calcium: 8.5 mg/dL — ABNORMAL LOW (ref 8.9–10.3)
Chloride: 86 mmol/L — ABNORMAL LOW (ref 98–111)
Chloride: 87 mmol/L — ABNORMAL LOW (ref 98–111)
Creatinine, Ser: 0.47 mg/dL — ABNORMAL LOW (ref 0.61–1.24)
Creatinine, Ser: 0.45 mg/dL — ABNORMAL LOW (ref 0.61–1.24)
GFR, Estimated: >60 mL/min (ref >60)
GFR, Estimated: >60 mL/min (ref >60)
Glucose, Bld: 118 mg/dL — ABNORMAL HIGH (ref 70–99)
Glucose, Bld: 103 mg/dL — ABNORMAL HIGH (ref 70–99)
Potassium: 3.6 mmol/L (ref 3.5–5.1)
Potassium: 3.8 mmol/L (ref 3.5–5.1)
Sodium: 120 mmol/L — ABNORMAL LOW (ref 135–145)
Sodium: 121 mmol/L — ABNORMAL LOW (ref 135–145)

## 2024-02-20 LAB — COMPREHENSIVE METABOLIC PANEL WITH GFR
ALT: 14 U/L (ref 0–44)
AST: 28 U/L (ref 15–41)
Albumin: 3.1 g/dL — ABNORMAL LOW (ref 3.5–5.0)
Alkaline Phosphatase: 75 U/L (ref 38–126)
Anion gap: 14 (ref 5–15)
BUN: 7 mg/dL — ABNORMAL LOW (ref 8–23)
CO2: 22 mmol/L (ref 22–32)
Calcium: 9.1 mg/dL (ref 8.9–10.3)
Chloride: 82 mmol/L — ABNORMAL LOW (ref 98–111)
Creatinine, Ser: <0.3 mg/dL — ABNORMAL LOW (ref 0.61–1.24)
Glucose, Bld: 88 mg/dL (ref 70–99)
Potassium: 3.9 mmol/L (ref 3.5–5.1)
Sodium: 118 mmol/L — CL (ref 135–145)
Total Bilirubin: 1.8 mg/dL — ABNORMAL HIGH (ref 0.0–1.2)
Total Protein: 7.6 g/dL (ref 6.5–8.1)

## 2024-02-20 LAB — TSH: TSH: 1.367 u[IU]/mL (ref 0.350–4.500)

## 2024-02-20 LAB — ETHANOL: Alcohol, Ethyl (B): 33 mg/dL — ABNORMAL HIGH (ref <15)

## 2024-02-20 LAB — VITAMIN B12: Vitamin B-12: 414 pg/mL (ref 180–914)

## 2024-02-20 LAB — APTT: aPTT: 31 s (ref 24–36)

## 2024-02-20 LAB — BRAIN NATRIURETIC PEPTIDE: B Natriuretic Peptide: 81.6 pg/mL (ref 0.0–100.0)

## 2024-02-20 LAB — FOLATE: Folate: 9.3 ng/mL (ref >5.9)

## 2024-02-20 LAB — PROTIME-INR
INR: 1 (ref 0.8–1.2)
Prothrombin Time: 13.6 s (ref 11.4–15.2)

## 2024-02-20 LAB — MRSA NEXT GEN BY PCR, NASAL: MRSA by PCR Next Gen: NOT DETECTED

## 2024-02-20 LAB — SODIUM, URINE, RANDOM: Sodium, Ur: <10 mmol/L

## 2024-02-20 LAB — OSMOLALITY, URINE: Osmolality, Ur: 209 mosm/kg — ABNORMAL LOW (ref 300–900)

## 2024-02-20 MED ORDER — PHENOBARBITAL 32.4 MG PO TABS
64.8000 mg | ORAL_TABLET | Freq: Two times a day (BID) | ORAL | Status: AC
  Administered 2024-02-21 – 2024-02-22 (×2): 64.8 mg via ORAL
  Filled 2024-02-20 (×2): qty 2

## 2024-02-20 MED ORDER — IOHEXOL 300 MG/ML  SOLN
75.0000 mL | Freq: Once | INTRAMUSCULAR | Status: AC | PRN
  Administered 2024-02-20: 75 mL via INTRAVENOUS

## 2024-02-20 MED ORDER — PIPERACILLIN-TAZOBACTAM 3.375 G IVPB
3.3750 g | Freq: Three times a day (TID) | INTRAVENOUS | Status: DC
  Administered 2024-02-20 – 2024-02-25 (×14): 3.375 g via INTRAVENOUS
  Filled 2024-02-20 (×14): qty 50

## 2024-02-20 MED ORDER — CHLORHEXIDINE GLUCONATE CLOTH 2 % EX PADS
6.0000 | MEDICATED_PAD | Freq: Every day | CUTANEOUS | Status: DC
  Administered 2024-02-20 – 2024-02-27 (×8): 6 via TOPICAL

## 2024-02-20 MED ORDER — PHENOBARBITAL 32.4 MG PO TABS
16.2000 mg | ORAL_TABLET | Freq: Two times a day (BID) | ORAL | Status: AC
  Administered 2024-02-23 – 2024-02-24 (×2): 16.2 mg via ORAL
  Filled 2024-02-20 (×2): qty 1

## 2024-02-20 MED ORDER — VANCOMYCIN HCL 1750 MG/350ML IV SOLN
1750.0000 mg | INTRAVENOUS | Status: DC
  Administered 2024-02-20 – 2024-02-23 (×4): 1750 mg via INTRAVENOUS
  Filled 2024-02-20 (×4): qty 350

## 2024-02-20 MED ORDER — NICOTINE 14 MG/24HR TD PT24: 14.0000 mg | MEDICATED_PATCH | Freq: Every day | TRANSDERMAL | Status: DC | PRN

## 2024-02-20 MED ORDER — NICOTINE 14 MG/24HR TD PT24
14.0000 mg | MEDICATED_PATCH | Freq: Every day | TRANSDERMAL | Status: DC
  Administered 2024-02-20 – 2024-02-28 (×9): 14 mg via TRANSDERMAL
  Filled 2024-02-20 (×9): qty 1

## 2024-02-20 MED ORDER — LORAZEPAM 1 MG PO TABS
2.0000 mg | ORAL_TABLET | ORAL | Status: AC | PRN
Start: 2024-02-20 — End: 2024-02-23

## 2024-02-20 MED ORDER — ADULT MULTIVITAMIN W/MINERALS CH: 1.0000 | ORAL_TABLET | Freq: Every day | ORAL | Status: DC

## 2024-02-20 MED ORDER — THIAMINE HCL 100 MG/ML IJ SOLN
100.0000 mg | Freq: Every day | INTRAMUSCULAR | Status: DC
  Administered 2024-02-20 – 2024-02-21 (×2): 100 mg via INTRAVENOUS
  Filled 2024-02-20 (×2): qty 2

## 2024-02-20 MED ORDER — PHENOBARBITAL 32.4 MG PO TABS
97.2000 mg | ORAL_TABLET | Freq: Two times a day (BID) | ORAL | Status: AC
  Administered 2024-02-20 – 2024-02-21 (×2): 97.2 mg via ORAL
  Filled 2024-02-20 (×2): qty 3

## 2024-02-20 MED ORDER — ONDANSETRON HCL 4 MG PO TABS: 4.0000 mg | ORAL_TABLET | Freq: Four times a day (QID) | ORAL | Status: DC | PRN

## 2024-02-20 MED ORDER — PHENOBARBITAL 32.4 MG PO TABS
32.4000 mg | ORAL_TABLET | Freq: Two times a day (BID) | ORAL | Status: AC
  Administered 2024-02-22 – 2024-02-23 (×2): 32.4 mg via ORAL
  Filled 2024-02-20 (×2): qty 1

## 2024-02-20 MED ORDER — LORAZEPAM 1 MG PO TABS: 1.0000 mg | ORAL_TABLET | ORAL | Status: DC | PRN

## 2024-02-20 MED ORDER — ONDANSETRON HCL 4 MG/2ML IJ SOLN: 4.0000 mg | Freq: Four times a day (QID) | INTRAMUSCULAR | Status: DC | PRN

## 2024-02-20 MED ORDER — ADULT MULTIVITAMIN W/MINERALS CH
1.0000 | ORAL_TABLET | Freq: Every day | ORAL | Status: DC
  Administered 2024-02-20 – 2024-02-28 (×8): 1 via ORAL
  Filled 2024-02-20 (×8): qty 1

## 2024-02-20 MED ORDER — FOLIC ACID 1 MG PO TABS
1.0000 mg | ORAL_TABLET | Freq: Every day | ORAL | Status: DC
  Administered 2024-02-20 – 2024-02-28 (×9): 1 mg via ORAL
  Filled 2024-02-20 (×9): qty 1

## 2024-02-20 MED ORDER — LORAZEPAM 1 MG PO TABS: 1.0000 mg | ORAL_TABLET | ORAL | Status: AC | PRN

## 2024-02-20 MED ORDER — HEPARIN (PORCINE) 25000 UT/250ML-% IV SOLN
1400.0000 [IU]/h | INTRAVENOUS | Status: DC
  Administered 2024-02-20: 1150 [IU]/h via INTRAVENOUS
  Filled 2024-02-20 (×2): qty 250

## 2024-02-20 MED ORDER — HEPARIN BOLUS VIA INFUSION
4000.0000 [IU] | Freq: Once | INTRAVENOUS | Status: AC
  Administered 2024-02-20: 4000 [IU] via INTRAVENOUS
  Filled 2024-02-20: qty 4000

## 2024-02-20 MED ORDER — SODIUM CHLORIDE 0.9 % IV SOLN: INTRAVENOUS | Status: AC

## 2024-02-20 MED ORDER — THIAMINE MONONITRATE 100 MG PO TABS
100.0000 mg | ORAL_TABLET | Freq: Every day | ORAL | Status: DC
  Administered 2024-02-22 – 2024-02-28 (×7): 100 mg via ORAL
  Filled 2024-02-20 (×7): qty 1

## 2024-02-20 MED ORDER — CHLORHEXIDINE GLUCONATE CLOTH 2 % EX PADS: 6.0000 | MEDICATED_PAD | Freq: Every day | CUTANEOUS | Status: DC

## 2024-02-20 NOTE — ED Triage Notes (Signed)
 Patient to ED by EMS from home with c/o bilateral leg swelling. Per EMS patient denies SOB or pain states legs have been swelling for a week he has HX of daily alcohol use. 130/74 67 20 86% NRB CBG: 133

## 2024-02-20 NOTE — Consult Note (Addendum)
 NAME:  Cody Glenn, MRN:  [id number], DOB:  1956-02-02, LOS: 0 ADMISSION DATE:  02/20/2024, CONSULTATION DATE:  02/20/24 REFERRING MD:  ED CHIEF COMPLAINT:  Cavitary Lung Lesion   History of Present Illness:  Cody Glenn is a 68 year old male with history of alcohol abuse who presented to the ED with bilateral lower extremity edema. He drinks a gallon of beer per day.   CT Chest with contrast showed emphysematous disease, a left upper lung cavitary lesion measuring 7.4 x 4.1cm with solid component concerning for malignancy or possibly aspergilloma. There is peripheral/basilar predominant honey combing concerning for pulmonary fibrosis.   He reports productive cough with green sputum with occasional blood tinged sputum. He reports loss of 7 lbs of weight. He does report some sweats.  He does smoke 3 packs per week. He has been smoking cigarettes since age 33. He drinks alcohol daily. Denies other drug use. He is currently homeless and came from a homeless shelter. He was incarcerated for a few years in the 1990s. No known TB exposure.   Pertinent  Medical History   Past Medical History:  Diagnosis Date   Alcohol use    Hyperbilirubinemia 07/01/2020   Peptic ulcer 08/09/1987   Jul 28, 2008 Entered By: KATHERINE ETHA HERO Comment: s/p partial gastrectomy   Significant Hospital Events: Including procedures, antibiotic start and stop dates in addition to other pertinent events   7/15 admitted to hospital, PCCM consulted  Interim History / Subjective:  As above  Objective    Blood pressure 124/78, pulse 90, temperature 98.4 F (36.9 C), temperature source Axillary, resp. rate 19, height 5' 6 (1.676 m), weight 66 kg, SpO2 100%.       No intake or output data in the 24 hours ending 02/20/24 1535 Filed Weights   02/20/24 1205  Weight: 66 kg   Examination: General: elderly male, chronically ill appearing, no acute distress HENT: [place]/AT, moist mucous membranes Lungs: clear to  auscultation, no wheezing Cardiovascular: rrr, no murmurs Abdomen: soft, non-tender, non-distended Extremities: warm, pedal edema Neuro: alert, oriented, moving all extremities GU: n/a  Labs reviewed: Na 118, Cl 82, albumin 3.1, BNP 81 WBC 10.8, CBC 10.4, Plt 296 Alcohol 33  Resolved problem list   Assessment and Plan   Cavitary Lung lesion Emphysema Possible Pulmonary Fibrosis Cigarette Smoker Alcohol Dependence Hyponatremia Lower extremity edema  Plan: - Differential for cavitary lesion includes malignancy, aspiration in setting of alcoholism, TB, fungal infection or inflammatory etiology.  - Check quantiferon gold, fungitell - Check expectorated sputum culture and AFB smear/Culture - start vancomycin  and zosyn . Check MRSA PCR.  - Plan for bronchoscopy tomorrow for BAL +/- transbronchial biopsies. Will be NPO after midnight. - Check echo and lower extremity US  for the edema - Alcohol withdrawal and hyponatremia care per primary team - Nicotine  patch ordered  Best Practice (right click and Reselect all SmartList Selections daily)   Diet/type: Regular consistency (see orders), NPO after midnight DVT prophylaxis SCD Pressure ulcer(s): N/A GI prophylaxis: N/A Lines: N/A Foley:  N/A Code Status:  full code Last date of multidisciplinary goals of care discussion [Per primary]  Labs   CBC: Recent Labs  Lab 02/20/24 1223  WBC 10.8*  NEUTROABS 8.3*  HGB 10.4*  HCT 29.8*  MCV 88.7  PLT 296    Basic Metabolic Panel: Recent Labs  Lab 02/20/24 1223  NA 118*  K 3.9  CL 82*  CO2 22  GLUCOSE 88  BUN 7*  CREATININE <0.30*  CALCIUM 9.1   GFR: CrCl cannot be calculated (This lab value cannot be used to calculate CrCl because it is not a number: <0.30). Recent Labs  Lab 02/20/24 1223  WBC 10.8*    Liver Function Tests: Recent Labs  Lab 02/20/24 1223  AST 28  ALT 14  ALKPHOS 75  BILITOT 1.8*  PROT 7.6  ALBUMIN 3.1*   No results for input(s):  LIPASE, AMYLASE in the last 168 hours. No results for input(s): AMMONIA in the last 168 hours.  ABG    Component Value Date/Time   TCO2 23 07/21/2020 1747     Coagulation Profile: No results for input(s): INR, PROTIME in the last 168 hours.  Cardiac Enzymes: No results for input(s): CKTOTAL, CKMB, CKMBINDEX, TROPONINI in the last 168 hours.  HbA1C: Hgb A1c MFr Bld  Date/Time Value Ref Range Status  10/13/2020 02:08 PM 5.5 4.8 - 5.6 % Final    Comment:             Prediabetes: 5.7 - 6.4          Diabetes: >6.4          Glycemic control for adults with diabetes: <7.0     CBG: No results for input(s): GLUCAP in the last 168 hours.  Review of Systems:   Review of Systems  Constitutional:  Positive for weight loss. Negative for chills, fever and malaise/fatigue.  HENT:  Negative for congestion, sinus pain and sore throat.   Eyes: Negative.   Respiratory:  Positive for cough, hemoptysis and sputum production. Negative for shortness of breath and wheezing.   Cardiovascular:  Positive for leg swelling. Negative for chest pain, palpitations, orthopnea and claudication.  Gastrointestinal:  Negative for abdominal pain, heartburn, nausea and vomiting.  Genitourinary: Negative.   Musculoskeletal:  Negative for joint pain and myalgias.  Skin:  Negative for rash.  Neurological:  Negative for weakness.  Endo/Heme/Allergies: Negative.   Psychiatric/Behavioral: Negative.       Past Medical History:  He,  has a past medical history of Alcohol use, Hyperbilirubinemia (07/01/2020), and Peptic ulcer (08/09/1987).   Surgical History:   Past Surgical History:  Procedure Laterality Date   ulcer surgery in stomach     pt states he had this approx 33 years ago     Social History:   reports that he has been smoking cigarettes. He has a 40 pack-year smoking history. He has never used smokeless tobacco. He reports current alcohol use. He reports that he does not currently  use drugs.   Family History:  His family history is not on file.   Allergies No Known Allergies   Home Medications  Prior to Admission medications   Medication Sig Start Date End Date Taking? Authorizing Provider  folic acid  (FOLVITE ) 1 MG tablet Take 1 tablet (1 mg total) by mouth daily. 10/13/20   [name], MD  meloxicam  (MOBIC ) 15 MG tablet Take 1 tablet (15 mg total) by mouth daily. 01/02/24   [name], MD  thiamine  100 MG tablet Take 1 tablet (100 mg total) by mouth daily. 10/13/20   [name], MD  valsartan -hydrochlorothiazide  (DIOVAN  HCT) 80-12.5 MG tablet Take 1 tablet by mouth daily. 10/25/23   [name], MD     Critical care time: n/a    Dorn Chill, MD [facility] Office: [phone]   See Amion for personal pager PCCM on call pager [phone] until 7pm. Please call Elink 7p-7a. [phone]

## 2024-02-20 NOTE — Progress Notes (Signed)
 PHARMACY - ANTICOAGULATION CONSULT NOTE  Pharmacy Consult for Heparin  + Vanco + Zosyn  Indication: BL DVT, Multiple Cavitary lung lesions  No Known Allergies  Patient Measurements: Height: 5' 6 (167.6 cm) Weight: 66 kg (145 lb 8.1 oz) IBW/kg (Calculated) : 63.8 HEPARIN  DW (KG): 66  Vital Signs: Temp: 97.5 F (36.4 C) (07/15 1614) Temp Source: Axillary (07/15 1219) BP: 149/84 (07/15 1600) Pulse Rate: 64 (07/15 1600)  Labs: Recent Labs    02/20/24 1223  HGB 10.4*  HCT 29.8*  PLT 296  CREATININE <0.30*    CrCl cannot be calculated (This lab value cannot be used to calculate CrCl because it is not a number: <0.30).   Medical History: Past Medical History:  Diagnosis Date   Alcohol use    Hyperbilirubinemia 07/01/2020   Peptic ulcer 08/09/1987   Jul 28, 2008 Entered By: KATHERINE ETHA HERO Comment: s/p partial gastrectomy    Assessment: Active Problem(s): BL LE edema, productive cough with green sputum, occasionally blood-tinged. 7lb weight loss, seats.  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 Goal AUC 400-550. Scr 0.8    Daveon Arpino Karoline Marina, PharmD, [facility] Clinical Staff Pharmacist Marina Salines Stillinger 02/20/2024,5:30 PM

## 2024-02-20 NOTE — H&P (Signed)
 History and Physical    Patient: Cody Glenn DOB: [birth year] DOA: 02/20/2024 DOS: the patient was seen and examined on 02/20/2024 PCP: [name], MD  Patient coming from: Home  Chief Complaint:  Chief Complaint  Patient presents with   Leg Swelling   HPI: Cody Glenn is a 68 y.o. male with medical history significant of heavy alcohol use (about 4 to 540 ounces beer daily), PUD, history of partial gastrectomy, active smoking, prediabetes, primary hypertension, cervical spinal stenosis, history of homelessness who was brought in by EMS to the emergency department due to bilateral lower extremities for the past week associated with orthopnea and dyspnea.  No fever, but positive night sweats.  He also has productive cough with green sputum with occasional hemoptysis. He denied fever, chills, rhinorrhea, sore throat or wheezing.  No chest pain, palpitations, diaphoresis, PND, orthopnea or pitting edema of the lower extremities.  No abdominal pain, nausea, emesis, diarrhea, constipation, melena or hematochezia.  No flank pain, dysuria, frequency or hematuria.  No polyuria, polydipsia, polyphagia or blurred vision.   Lab work: CBC showed white count of 10.8, hemoglobin 10.4 g/dL platelets 703.  Normal PT, INR and PTT.  BNP was 81.6 pg/mL.  Alcohol level 33 mg/dL.  CMP showed a sodium 118, chloride 82 mmol/L, the rest of the electrolytes and glucose were normal.  BUN was 7, creatinine less than 0.30 and total bilirubin 1.8 mg/dL.  Albumin was 3.1 and total protein 7.6 g/dL.  Normal transaminases and alkaline phosphatase.  Imaging: Portable 1 view chest radiograph showing a faint most likely calcified opacity overlying the left midlung zone measuring 3.5 x 3.7 cm may represent pleural versus parenchymal calcification evaluation with chest CT if there are no prior images. There is diffuse mild groundglass opacity throughout bilateral lungs, which is nonspecific and may be due  to respiratory imaging.  No dense consolidation or lung collapse.  CT chest without contrast showing a 7.4 x 4.1 cm cavitary abnormality in the left lung apex with peripheral solid component seen inferiorly highly concerning for neoplasm or malignancy, or possibly aspergilloma.  Emphysematous disease is noted throughout both lungs with bullous changes seen in both upper lungs.  No peripheral honeycombing is noted throughout both lungs concerning for possible pulmonary fibrosis.  Nondisplaced right eighth rib fracture.  4.1 cm ascending thoracic aortic aneurysm with annual imaging recommended.  Coronary calcifications.  Aortic atherosclerosis.   ED course: Initial vital signs were temperature 98.4 F, pulse 55, respiration 18, BP 146/91 mmHg O2 sat 86% on room air.  The patient was started on CIWA protocol with lorazepam .  PCCM consulted.  They started the patient on heparin , vancomycin  and Zosyn .  Review of Systems: As mentioned in the history of present illness. All other systems reviewed and are negative. Past Medical History:  Diagnosis Date   Alcohol use    Hyperbilirubinemia 07/01/2020   Peptic ulcer 08/09/1987   Jul 28, 2008 Entered By: KATHERINE ETHA HERO Comment: s/p partial gastrectomy   Past Surgical History:  Procedure Laterality Date   ulcer surgery in stomach     pt states he had this approx 33 years ago   Social History:  reports that he has been smoking cigarettes. He has a 40 pack-year smoking history. He has never used smokeless tobacco. He reports current alcohol use. He reports that he does not currently use drugs.  No Known Allergies  No family history on file. Not sure about family history  Prior to Admission medications  Medication Sig Start Date End Date Taking? Authorizing Provider  folic acid  (FOLVITE ) 1 MG tablet Take 1 tablet (1 mg total) by mouth daily. 10/13/20   [name], MD  meloxicam  (MOBIC ) 15 MG tablet Take 1 tablet (15 mg total) by mouth daily.  01/02/24   [name], MD  thiamine  100 MG tablet Take 1 tablet (100 mg total) by mouth daily. 10/13/20   [name], MD  valsartan -hydrochlorothiazide  (DIOVAN  HCT) 80-12.5 MG tablet Take 1 tablet by mouth daily. 10/25/23   [name], MD    Physical Exam: Vitals:   02/20/24 1430 02/20/24 1518 02/20/24 1600 02/20/24 1614  BP: (!) 156/74 124/78 (!) 149/84   Pulse: 64 90 64   Resp: 19  18   Temp:    (!) 97.5 F (36.4 C)  TempSrc:      SpO2: 100%  (!) 85%   Weight:      Height:       Physical Exam Vitals and nursing note reviewed.  Constitutional:      General: He is awake. He is not in acute distress.    Appearance: He is ill-appearing.  HENT:     Head: Normocephalic.     Mouth/Throat:     Mouth: Mucous membranes are moist.  Eyes:     General: No scleral icterus.    Pupils: Pupils are equal, round, and reactive to light.  Neck:     Vascular: No JVD.  Cardiovascular:     Rate and Rhythm: Normal rate and regular rhythm.     Heart sounds: S1 normal and S2 normal.  Pulmonary:     Effort: Pulmonary effort is normal.     Breath sounds: Normal breath sounds. No wheezing, rhonchi or rales.  Abdominal:     General: Bowel sounds are normal. There is no distension.     Palpations: Abdomen is soft.     Tenderness: There is no abdominal tenderness. There is no right CVA tenderness.  Musculoskeletal:     Cervical back: Neck supple.     Right lower leg: No edema.     Left lower leg: No edema.  Skin:    General: Skin is warm and dry.  Neurological:     General: No focal deficit present.     Mental Status: He is alert and oriented to person, place, and time.  Psychiatric:        Mood and Affect: Mood normal.        Behavior: Behavior normal. Behavior is cooperative.     Data Reviewed:  Results are pending, will review when available.  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   Admit to stepdown/inpatient. Fluid  restriction. Normal saline infusion 125 mL/h. Monitor sodium level.  Active Problems:   Alcohol use CIWA protocol with lorazepam . Will add phenobarbital  taper. Magnesium  sulfate supplementation. Folate, MVI and thiamine . Consult TOC team.    Acute respiratory failure with hypoxia (HCC) In the setting of:   Cavitary lesion of lung And   Emphysema/COPD (HCC) Supplemental oxygen as needed. Zosyn  per pharmacy Vancomycin  per pharmacy. Follow CBC and CMP in a.m. Pulmonary consult appreciated. - Will follow the recommendations.    Tobacco use Tobacco cessation advised. Nicotine  replacement therapy ordered.    Primary hypertension Monitor blood pressure. Oral or parenteral antihypertensives as needed.    Prediabetes Blood glucose is normal.    Normocytic anemia Monitor hematocrit and hemoglobin.    Mild protein malnutrition (  HCC) In the setting of anemia and acute illness. Also EtOH and cavitary lesion. May benefit from protein supplementation. Follow-up albumin level.    Ascending aortic aneurysm (HCC) Echocardiogram ordered by PCCM. Will need yearly follow-up imaging.    Coronary artery calcification Will follow echocardiogram results. Tobacco cessation.    Aortic atherosclerosis (HCC) Would benefit from statin therapy. -However, carries a risk if drinking alcohol.    Closed fracture of eight ribs of right side No significant pain.    DVT of lower extremity, bilateral (HCC) Started on heparin  per pharmacy.     Advance Care Planning:   Code Status: Full Code   Consults: Pulmonary medicine Raechel Chill, MD).  Family Communication:   Severity of Illness: The appropriate patient status for this patient is INPATIENT. Inpatient status is judged to be reasonable and necessary in order to provide the required intensity of service to ensure the patient's safety. The patient's presenting symptoms, physical exam findings, and initial radiographic and  laboratory data in the context of their chronic comorbidities is felt to place them at high risk for further clinical deterioration. Furthermore, it is not anticipated that the patient will be medically stable for discharge from the hospital within 2 midnights of admission.   * I certify that at the point of admission it is my clinical judgment that the patient will require inpatient hospital care spanning beyond 2 midnights from the point of admission due to high intensity of service, high risk for further deterioration and high frequency of surveillance required.*  Author: Alm Dorn Castor, MD 02/20/2024 4:22 PM  For on call review [email or web address].   This document was prepared using Dragon voice recognition software and may contain some unintended transcription errors.

## 2024-02-20 NOTE — ED Provider Notes (Signed)
 [place] EMERGENCY DEPARTMENT AT [facility] Provider Note   CSN: [id number] Arrival date & time: 02/20/24  1158     Patient presents with: Leg Swelling   Cody Glenn is a 68 y.o. male.   68 year old male presenting with bilateral lower extremity edema.  Symptoms been ongoing for approximately 1 week, edema has been constant, states this has never happened before nor has he been diagnosed with kidney disease/heart disease in the past. Patient sleeps sitting upright with his legs down so he is unable to tell me if the edema is improved with elevation.  Patient denies shortness of breath, chest pain.  States that he takes meloxicam  and 1 other medication daily, however when he produces these pill bottles it is difficult to read the labels as they are faded, and the second medication is empty.  He reports daily alcohol use, guessing that he drinks about a gallon of beer a day, last drink was earlier today, no history of withdrawal seizures.  No other complaints at this time.        Prior to Admission medications   Medication Sig Start Date End Date Taking? Authorizing Provider  folic acid  (FOLVITE ) 1 MG tablet Take 1 tablet (1 mg total) by mouth daily. 10/13/20   [name], MD  meloxicam  (MOBIC ) 15 MG tablet Take 1 tablet (15 mg total) by mouth daily. 01/02/24   [name], MD  thiamine  100 MG tablet Take 1 tablet (100 mg total) by mouth daily. 10/13/20   [name], MD  valsartan -hydrochlorothiazide  (DIOVAN  HCT) 80-12.5 MG tablet Take 1 tablet by mouth daily. 10/25/23   [name], MD    Allergies: Patient has no known allergies.    Review of Systems  Updated Vital Signs  Vitals:   02/20/24 1219 02/20/24 1300 02/20/24 1430 02/20/24 1518  BP:  (!) 141/71 (!) 156/74 124/78  Pulse:  (!) 51 64 90  Resp:  13 19   Temp: 98.4 F (36.9 C)     TempSrc: Axillary     SpO2:  100% 100%   Weight:      Height:         Physical Exam Vitals  and nursing note reviewed.  HENT:     Head: Normocephalic and atraumatic.  Eyes:     Extraocular Movements: Extraocular movements intact.  Neck:     Vascular: No JVD.  Cardiovascular:     Rate and Rhythm: Regular rhythm. Bradycardia present.  Pulmonary:     Effort: Pulmonary effort is normal. No respiratory distress.     Breath sounds: Normal breath sounds.     Comments: Speaks in full sentences, no accessory muscle use Musculoskeletal:     Cervical back: Normal range of motion.     Right lower leg: Edema (2+ pitting) present.     Left lower leg: Edema (2+ pitting) present.     Comments: Moves all extremities spontaneously without difficulty  Skin:    General: Skin is warm and dry.  Neurological:     Mental Status: He is alert and oriented to person, place, and time.     Sensory: No sensory deficit.     Motor: No weakness.     Comments: No tremor     (all labs ordered are listed, but only abnormal results are displayed) Labs Reviewed  COMPREHENSIVE METABOLIC PANEL WITH GFR - Abnormal; Notable for the following components:      Result Value   Sodium 118 (*)  Chloride 82 (*)    BUN 7 (*)    Creatinine, Ser <0.30 (*)    Albumin 3.1 (*)    Total Bilirubin 1.8 (*)    All other components within normal limits  CBC WITH DIFFERENTIAL/PLATELET - Abnormal; Notable for the following components:   WBC 10.8 (*)    RBC 3.36 (*)    Hemoglobin 10.4 (*)    HCT 29.8 (*)    Neutro Abs 8.3 (*)    Abs Immature Granulocytes 0.14 (*)    All other components within normal limits  ETHANOL - Abnormal; Notable for the following components:   Alcohol, Ethyl (B) 33 (*)    All other components within normal limits  BRAIN NATRIURETIC PEPTIDE  SODIUM, URINE, RANDOM  OSMOLALITY, URINE  QUANTIFERON-TB GOLD PLUS  ASPERGILLUS ANTIBODY BY IMMUNODIFF  FUNGITELL BETA-D-GLUCAN  TSH  VITAMIN B12  FOLATE    EKG: None  Radiology: CT Chest W Contrast Result Date: 02/20/2024 CLINICAL DATA:   Bilateral lower extremity swelling. Chest calcification. EXAM: CT CHEST WITH CONTRAST TECHNIQUE: Multidetector CT imaging of the chest was performed during intravenous contrast administration. RADIATION DOSE REDUCTION: This exam was performed according to the departmental dose-optimization program which includes automated exposure control, adjustment of the mA and/or kV according to patient size and/or use of iterative reconstruction technique. CONTRAST:  75mL OMNIPAQUE  IOHEXOL  300 MG/ML  SOLN COMPARISON:  Chest x-ray of same day. FINDINGS: Cardiovascular: 4.1 cm ascending thoracic aortic aneurysm. Normal cardiac size. No pericardial effusion. Coronary artery calcifications are noted. Mediastinum/Nodes: No enlarged mediastinal, hilar, or axillary lymph nodes. Thyroid  gland, trachea, and esophagus demonstrate no significant findings. Lungs/Pleura: No pneumothorax or pleural effusion is noted. Emphysematous disease is noted throughout both lungs. 7.4 x 4.1 cm cavitary abnormality is noted in left lung apex with peripheral solid component seen inferiorly highly concerning for neoplasm or malignancy, or possibly aspergilloma. Peripheral honeycombing is noted in both lung bases suggesting some degree of fibrosis. Upper Abdomen: No acute abnormality. Musculoskeletal: Nondisplaced right eighth rib fracture is noted. IMPRESSION: 7.4 x 4.1 cm cavitary abnormality is noted in left lung apex with peripheral solid component seen inferiorly highly concerning for neoplasm or malignancy, or possibly aspergilloma. Emphysematous disease is noted throughout both lungs with bullous seen in both upper lobes. Peripheral honeycombing is noted throughout both lungs concerning for possible pulmonary fibrosis. Nondisplaced right eighth rib fracture. 4.1 cm ascending thoracic aortic aneurysm. Recommend annual imaging followup by CTA or MRA. This recommendation follows 2010 ACCF/AHA/AATS/ACR/ASA/SCA/SCAI/SIR/STS/SVM Guidelines for the  Diagnosis and Management of Patients with Thoracic Aortic Disease. Circulation. 2010; 121: Z733-z630. Aortic aneurysm NOS (ICD10-I71.9). Coronary calcifications are noted suggesting coronary artery disease. Aortic Atherosclerosis (ICD10-I70.0) and Emphysema (ICD10-J43.9). Electronically Signed   By: Lynwood Landy Raddle M.D.   On: 02/20/2024 14:42   DG Chest Portable 1 View Result Date: 02/20/2024 CLINICAL DATA:  Hypoxia.  Bilateral leg swelling. EXAM: PORTABLE CHEST 1 VIEW COMPARISON:  None Available. FINDINGS: Low lung volume. There is a faint most likely calcified opacity overlying the left mid lung zone measuring 3.5 x 3.7 cm. This may represent pleural calcification versus parenchymal calcification. If prior imaging is available, comparison can be made. There are diffuse mild ground-glass opacities throughout bilateral lungs, which is nonspecific and may be due to expiratory imaging. No dense consolidation or lung collapse. Bilateral lateral costophrenic angles are clear. Normal cardio-mediastinal silhouette. No acute osseous abnormalities. The soft tissues are within normal limits. IMPRESSION: 1. There is a faint most likely calcified opacity overlying the  left mid lung zone measuring 3.5 x 3.7 cm. This may represent pleural calcification versus parenchymal calcification. If prior imaging is available, comparison can be made. Otherwise, consider better evaluation with chest CT scan. 2. There are diffuse mild ground-glass opacities throughout bilateral lungs, which is nonspecific and may be due to expiratory imaging. No dense consolidation or lung collapse. Electronically Signed   By: Ree Molt M.D.   On: 02/20/2024 12:41     Procedures   Medications Ordered in the ED  LORazepam  (ATIVAN ) tablet 1-4 mg (has no administration in time range)    Or  LORazepam  (ATIVAN ) tablet 1 mg (has no administration in time range)  thiamine  (VITAMIN B1) tablet 100 mg (has no administration in time range)    Or   thiamine  (VITAMIN B1) injection 100 mg (has no administration in time range)  folic acid  (FOLVITE ) tablet 1 mg (has no administration in time range)  multivitamin with minerals tablet 1 tablet (has no administration in time range)  iohexol  (OMNIPAQUE ) 300 MG/ML solution 75 mL (75 mLs Intravenous Contrast Given 02/20/24 1406)                                    Medical Decision Making This patient presents to the ED for concern of lower extremity edema, this involves an extensive number of treatment options, and is a complaint that carries with it a high risk of complications and morbidity.  The differential diagnosis includes dependent edema, venous insufficiency, heart failure, chronic kidney disease, lymphedema, cirrhosis.   Co morbidities that complicate the patient evaluation  HTN, OA   Additional history obtained:  Additional history obtained from chart review External records from outside source obtained and reviewed including prior ED note, case management notes   Lab Tests:  I Ordered, and personally interpreted labs.  The pertinent results include: CBC notable for leukocytosis at 10.8, anemia with hemoglobin of 10.4 along with reduced hematocrit/RBC count.  CMP notable for hyponatremia with sodium of 118, creatinine reduced at 0.30, total bilirubin mildly elevated at 1.8.  I have no more recent values for comparison, last lab work in our system was 3 years ago. Ethanol level elevated at 33.  BNP within normal limits at 81.6.    Imaging Studies ordered:  I ordered imaging studies including CXR, chest CT I independently visualized and interpreted imaging which showed  CXR:  1. There is a faint most likely calcified opacity overlying the left mid lung zone measuring 3.5 x 3.7 cm. This may represent pleural calcification versus parenchymal calcification. If prior imaging is available, comparison can be made. Otherwise, consider better evaluation with chest CT scan. 2. There  are diffuse mild ground-glass opacities throughout bilateral lungs, which is nonspecific and may be due to expiratory imaging. No dense consolidation or lung collapse.  CT: 7.4 x 4.1 cm cavitary abnormality is noted in left lung apex with peripheral solid component seen inferiorly highly concerning for neoplasm or malignancy, or possibly aspergilloma.   Emphysematous disease is noted throughout both lungs with bullous seen in both upper lobes.   Peripheral honeycombing is noted throughout both lungs concerning for possible pulmonary fibrosis.  Nondisplaced right eighth rib fracture.   4.1 cm ascending thoracic aortic aneurysm. Recommend annual imaging followup by CTA or MRA. This recommendation follows 2010 ACCF/AHA/AATS/ACR/ASA/SCA/SCAI/SIR/STS/SVM Guidelines for the Diagnosis and Management of Patients with Thoracic Aortic Disease. Circulation. 2010; 121: Z733-z630. Aortic aneurysm NOS (ICD10-I71.9).  Coronary calcifications are noted suggesting coronary artery disease. Aortic Atherosclerosis (ICD10-I70.0) and Emphysema (ICD10-J43.9).  I agree with the radiologist interpretation   Cardiac Monitoring: / EKG:  The patient was maintained on a cardiac monitor.  I personally viewed and interpreted the cardiac monitored which showed an underlying rhythm of: sinus bradycardia   Consultations Obtained:  I requested consultation with the hospitalist service,  and discussed lab and imaging findings as well as pertinent plan - they recommend: Spoke with Dr. Celinda who agrees that this patient is appropriate for admission, he requested that I contact the pulmonary critical care service to make them aware of this patient given his findings on CT and request that they follow-up with this patient while he is admitted to the hospital. Spoke with pulmonary critical care who is in agreement with this plan.    Problem List / ED Course / Critical interventions / Medication management  I ordered medication  including folic acid /multivitamin/thiamine /as needed lorazepam  for in accordance with CIWA protocol Reevaluation of the patient after these medicines showed that the patient stayed the same I have reviewed the patients home medicines and have made adjustments as needed   Social Determinants of Health:  Homelessness   Test / Admission - Considered:  Physical exam notable as above.  Based on chart review, it appears that patient was previously prescribed blood pressure medication including valsartan -hydrochlorothiazide , he was unaware of this medication and it is unclear if he was taking this medication at any point.  See above for lab/imaging interpretation.  Patient is notably hyponatremic, this is likely secondary to chronic alcohol use, will consult hospitalist for admission.  Urine osmolality and urine sodium pending at time of hospitalist consult.  Patient is homeless and CT results are concerning for cavitary lesion, QuantiFERON pending.  I did initiate CIWA protocol in the emergency department given patient reports daily heavy alcohol use, he is not demonstrating any signs of acute alcohol withdrawal at this time, no tremor, no AMS.  He is appropriate for admission at this time.  Staffed with Dr. Albertina    Amount and/or Complexity of Data Reviewed Labs: ordered. Radiology: ordered.  Risk OTC drugs. Prescription drug management. Decision regarding hospitalization.        Final diagnoses:  Hyponatremia  Alcohol abuse  Cavitary lesion of lung  Thoracic aortic aneurysm without rupture, unspecified part [facility])    ED Discharge Orders     None          Tequlia Gonsalves N, PA-C 02/20/24 1557    Albertina Dixon, MD 02/20/24 [phone]

## 2024-02-20 NOTE — ED Notes (Signed)
 Pt noted to have urinated on himself. Clothing was changed as well as his gown, he was cleaned up and a purwick was placed. Pt had no complaints at this time.

## 2024-02-20 NOTE — Progress Notes (Signed)
 eLink Physician-Brief Progress Note Patient Name: Cody Glenn DOB: 1956/05/02 MRN: [id number]   Date of Service  02/20/2024  HPI/Events of Note  68 y.o. male with medical history significant of heavy alcohol use (about 4 to 540 ounces beer daily), PUD, history of partial gastrectomy, active smoking, prediabetes, primary hypertension, cervical spinal stenosis, history of homelessness who was brought in by EMS to the emergency department due to bilateral lower extremities for the past week associated with orthopnea and dyspnea.  Admitted with severe hyponatremia and respiratory failure with hypoxemia.  Patient has normal vitals and is saturating 100% on room air.  Results show severe hyponatremia and alcohol level of 33.  Otherwise unremarkable.  Cirrhotic liver.  eICU Interventions  Maintain broad-spectrum antibiotics, cultures pending  Phenobarbital  taper and multivitamins in the setting of withdrawal  Continuous IVF at 125 cc/h, track sodium closely  DVT prophylaxis with therapeutic heparin  GI prophylaxis not indicated     Intervention Category Evaluation Type: New Patient Evaluation  Muneeb Veras 02/20/2024, 9:40 PM

## 2024-02-20 NOTE — Progress Notes (Signed)
 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   Plan to start heparin  drip. Will consult vascular surgery regarding the AV malformation.  Dorn Chill, MD [place] [facility] Office: [phone]   See Amion for personal pager PCCM on call pager [phone] until 7pm. Please call Elink 7p-7a. [phone]

## 2024-02-20 NOTE — Progress Notes (Signed)
 BLE venous exam completed.  Results given to Dr. Maryalice. Harce Volden, RVT

## 2024-02-21 ENCOUNTER — Inpatient Hospital Stay (HOSPITAL_COMMUNITY): Admitting: Certified Registered"

## 2024-02-21 ENCOUNTER — Inpatient Hospital Stay (HOSPITAL_COMMUNITY)

## 2024-02-21 ENCOUNTER — Encounter (HOSPITAL_COMMUNITY): Payer: Self-pay | Admitting: Internal Medicine

## 2024-02-21 ENCOUNTER — Encounter (HOSPITAL_COMMUNITY)
Admission: EM | Disposition: A | Discharge status: Home / Self Care | Payer: Self-pay | Source: Home / Self Care | Attending: Internal Medicine

## 2024-02-21 DIAGNOSIS — E871 Hypo-osmolality and hyponatremia: Secondary | ICD-10-CM | POA: Diagnosis not present

## 2024-02-21 DIAGNOSIS — I10 Essential (primary) hypertension: Secondary | ICD-10-CM | POA: Diagnosis not present

## 2024-02-21 DIAGNOSIS — J984 Other disorders of lung: Secondary | ICD-10-CM

## 2024-02-21 DIAGNOSIS — F17210 Nicotine dependence, cigarettes, uncomplicated: Secondary | ICD-10-CM | POA: Diagnosis not present

## 2024-02-21 DIAGNOSIS — I2510 Atherosclerotic heart disease of native coronary artery without angina pectoris: Secondary | ICD-10-CM | POA: Diagnosis not present

## 2024-02-21 DIAGNOSIS — R0609 Other forms of dyspnea: Secondary | ICD-10-CM | POA: Diagnosis not present

## 2024-02-21 DIAGNOSIS — F1020 Alcohol dependence, uncomplicated: Secondary | ICD-10-CM

## 2024-02-21 DIAGNOSIS — R918 Other nonspecific abnormal finding of lung field: Secondary | ICD-10-CM | POA: Diagnosis not present

## 2024-02-21 HISTORY — PX: FLEXIBLE BRONCHOSCOPY: SHX5094

## 2024-02-21 LAB — HEPARIN LEVEL (UNFRACTIONATED): Heparin Unfractionated: 0.12 [IU]/mL — ABNORMAL LOW (ref 0.30–0.70)

## 2024-02-21 LAB — CBC
HCT: 28.2 % — ABNORMAL LOW (ref 39.0–52.0)
Hemoglobin: 9.7 g/dL — ABNORMAL LOW (ref 13.0–17.0)
MCH: 31.2 pg (ref 26.0–34.0)
MCHC: 34.4 g/dL (ref 30.0–36.0)
MCV: 90.7 fL (ref 80.0–100.0)
Platelets: 300 K/uL (ref 150–400)
RBC: 3.11 MIL/uL — ABNORMAL LOW (ref 4.22–5.81)
RDW: 14.6 % (ref 11.5–15.5)
WBC: 8.3 K/uL (ref 4.0–10.5)
nRBC: 0 % (ref 0.0–0.2)

## 2024-02-21 LAB — ECHOCARDIOGRAM COMPLETE
AR max vel: 2.67 cm2
AV Area VTI: 2.68 cm2
AV Area mean vel: 2.52 cm2
AV Mean grad: 2 mmHg
AV Peak grad: 3.6 mmHg
Ao pk vel: 0.95 m/s
Area-P 1/2: 2.84 cm2
Height: 65 in
S' Lateral: 3.1 cm
Weight: 2433.88 [oz_av]

## 2024-02-21 LAB — HIV ANTIBODY (ROUTINE TESTING W REFLEX): HIV Screen 4th Generation wRfx: NONREACTIVE

## 2024-02-21 SURGERY — BRONCHOSCOPY, FLEXIBLE
Anesthesia: General | Laterality: Left

## 2024-02-21 MED ORDER — EPHEDRINE SULFATE-NACL 50-0.9 MG/10ML-% IV SOSY
PREFILLED_SYRINGE | INTRAVENOUS | Status: DC | PRN
  Administered 2024-02-21: 10 mg via INTRAVENOUS

## 2024-02-21 MED ORDER — HEPARIN (PORCINE) 25000 UT/250ML-% IV SOLN
1500.0000 [IU]/h | INTRAVENOUS | Status: DC
  Administered 2024-02-21: 1400 [IU]/h via INTRAVENOUS
  Administered 2024-02-22: 1500 [IU]/h via INTRAVENOUS
  Filled 2024-02-21: qty 250

## 2024-02-21 MED ORDER — MIDAZOLAM HCL 2 MG/2ML IJ SOLN
INTRAMUSCULAR | Status: AC
  Filled 2024-02-21: qty 2

## 2024-02-21 MED ORDER — MIDAZOLAM HCL 5 MG/5ML IJ SOLN
INTRAMUSCULAR | Status: DC | PRN
  Administered 2024-02-21: 2 mg via INTRAVENOUS

## 2024-02-21 MED ORDER — SUGAMMADEX SODIUM 200 MG/2ML IV SOLN
INTRAVENOUS | Status: DC | PRN
  Administered 2024-02-21: 200 mg via INTRAVENOUS

## 2024-02-21 MED ORDER — DEXAMETHASONE SODIUM PHOSPHATE 10 MG/ML IJ SOLN
INTRAMUSCULAR | Status: DC | PRN
  Administered 2024-02-21: 10 mg via INTRAVENOUS

## 2024-02-21 MED ORDER — ONDANSETRON HCL 4 MG/2ML IJ SOLN
INTRAMUSCULAR | Status: DC | PRN
  Administered 2024-02-21: 4 mg via INTRAVENOUS

## 2024-02-21 MED ORDER — ROCURONIUM BROMIDE 10 MG/ML (PF) SYRINGE
PREFILLED_SYRINGE | INTRAVENOUS | Status: DC | PRN
  Administered 2024-02-21: 30 mg via INTRAVENOUS

## 2024-02-21 MED ORDER — FENTANYL CITRATE (PF) 100 MCG/2ML IJ SOLN
INTRAMUSCULAR | Status: AC
  Filled 2024-02-21: qty 2

## 2024-02-21 MED ORDER — CHLORHEXIDINE GLUCONATE 0.12 % MT SOLN
OROMUCOSAL | Status: AC
  Filled 2024-02-21: qty 15

## 2024-02-21 MED ORDER — CHLORHEXIDINE GLUCONATE 0.12 % MT SOLN
15.0000 mL | Freq: Once | OROMUCOSAL | Status: AC
  Administered 2024-02-21: 15 mL via OROMUCOSAL

## 2024-02-21 MED ORDER — PROPOFOL 10 MG/ML IV BOLUS
INTRAVENOUS | Status: DC | PRN
  Administered 2024-02-21: 200 mg via INTRAVENOUS

## 2024-02-21 MED ORDER — SUCCINYLCHOLINE CHLORIDE 200 MG/10ML IV SOSY
PREFILLED_SYRINGE | INTRAVENOUS | Status: DC | PRN
  Administered 2024-02-21: 100 mg via INTRAVENOUS

## 2024-02-21 MED ORDER — LIDOCAINE 2% (20 MG/ML) 5 ML SYRINGE
INTRAMUSCULAR | Status: DC | PRN
  Administered 2024-02-21: 80 mg via INTRAVENOUS

## 2024-02-21 MED ORDER — HEPARIN BOLUS VIA INFUSION
2000.0000 [IU] | Freq: Once | INTRAVENOUS | Status: AC
  Administered 2024-02-21: 2000 [IU] via INTRAVENOUS
  Filled 2024-02-21: qty 2000

## 2024-02-21 MED ORDER — FENTANYL CITRATE (PF) 100 MCG/2ML IJ SOLN
INTRAMUSCULAR | Status: DC | PRN
  Administered 2024-02-21 (×2): 50 ug via INTRAVENOUS

## 2024-02-21 MED ORDER — PROPOFOL 10 MG/ML IV BOLUS
INTRAVENOUS | Status: AC
  Filled 2024-02-21: qty 20

## 2024-02-21 NOTE — Anesthesia Preprocedure Evaluation (Addendum)
 Anesthesia Evaluation  Patient identified by MRN, date of birth, ID band Patient awake    Reviewed: Allergy & Precautions, NPO status , Patient's Chart, lab work & pertinent test results  Airway Mallampati: II  TM Distance: >3 FB Neck ROM: Full    Dental no notable dental hx. (+) Teeth Intact, Dental Advisory Given   Pulmonary COPD, Current Smoker and Patient abstained from smoking.   Pulmonary exam normal breath sounds clear to auscultation       Cardiovascular hypertension, Pt. on medications + CAD  Normal cardiovascular exam Rhythm:Regular Rate:Normal     Neuro/Psych negative neurological ROS  negative psych ROS   GI/Hepatic PUD,,,(+)     substance abuse  alcohol use  Endo/Other  negative endocrine ROS    Renal/GU Lab Results      Component                Value               Date                      NA                       121 (L)             02/20/2024                CL                       87 (L)              02/20/2024                K                        3.8                 02/20/2024                CO2                      22                  02/20/2024                BUN                      7 (L)               02/20/2024                CREATININE               0.45 (L)            02/20/2024                GFRNONAA                 >60                 02/20/2024                CALCIUM                  8.5 (L)             02/20/2024  PHOS                     3.3                 07/01/2020                ALBUMIN                  3.1 (L)             02/20/2024                GLUCOSE                  103 (H)             02/20/2024                Musculoskeletal  (+) Arthritis ,    Abdominal   Peds  Hematology  (+) Blood dyscrasia, anemia Lab Results      Component                Value               Date                      WBC                      8.3                 02/21/2024                 HGB                      9.7 (L)             02/21/2024                HCT                      28.2 (L)            02/21/2024                MCV                      90.7                02/21/2024                PLT                      300                 02/21/2024              Anesthesia Other Findings   Reproductive/Obstetrics                              Anesthesia Physical Anesthesia Plan  ASA: 3  Anesthesia Plan: General   Post-op Pain Management: Minimal or no pain anticipated   Induction: Rapid sequence, Cricoid pressure planned and Intravenous  PONV Risk Score and Plan: Treatment may vary due to age or medical condition, Midazolam  and Ondansetron   Airway Management Planned: Oral ETT  Additional Equipment: None  Intra-op Plan:   Post-operative Plan: Extubation in OR  Informed Consent: I have reviewed the patients History and Physical, chart, labs and discussed the procedure including the risks, benefits and alternatives for the proposed anesthesia with the patient or authorized representative who has indicated his/her understanding and acceptance.     Dental advisory given  Plan Discussed with: CRNA and Surgeon  Anesthesia Plan Comments:          Anesthesia Quick Evaluation

## 2024-02-21 NOTE — Transfer of Care (Signed)
 Immediate Anesthesia Transfer of Care Note  Patient: Cody Glenn  Procedure(s) Performed: BRONCHOSCOPY, FLEXIBLE (Left)  Patient Location: PACU  Anesthesia Type:General  Level of Consciousness: awake, alert , and sedated  Airway & Oxygen Therapy: Patient Spontanous Breathing and Patient connected to face mask oxygen  Post-op Assessment: Report given to RN and Post -op Vital signs reviewed and stable  Post vital signs: Reviewed and stable  Last Vitals:  Vitals Value Taken Time  BP    Temp    Pulse    Resp    SpO2      Last Pain:  Vitals:   02/21/24 1323  TempSrc: Temporal  PainSc: 0-No pain         Complications: No notable events documented.

## 2024-02-21 NOTE — Progress Notes (Signed)
 PHARMACY - ANTICOAGULATION CONSULT NOTE  Pharmacy Consult for Heparin  Indication: BL DVT  No Known Allergies  Patient Measurements: Height: 5' 6 (167.6 cm) Weight: 66 kg (145 lb 8.1 oz) IBW/kg (Calculated) : 63.8 HEPARIN  DW (KG): 66  Vital Signs: Temp: 97.8 F (36.6 C) (07/16 0000) Temp Source: Oral (07/16 0000) BP: 141/71 (07/16 0300) Pulse Rate: 48 (07/16 0300)  Labs: Recent Labs    02/20/24 1223 02/20/24 1653 02/20/24 1723 02/20/24 2200 02/21/24 0139  HGB 10.4*  --   --   --  9.7*  HCT 29.8*  --   --   --  28.2*  PLT 296  --   --   --  300  APTT  --   --  31  --   --   LABPROT  --   --  13.6  --   --   INR  --   --  1.0  --   --   HEPARINUNFRC  --   --   --   --  0.12*  CREATININE <0.30* 0.47*  --  0.45*  --     Estimated Creatinine Clearance: 80.9 mL/min (A) (by C-G formula based on SCr of 0.45 mg/dL (L)).   Medical History: Past Medical History:  Diagnosis Date   Alcohol use    Hyperbilirubinemia 07/01/2020   Peptic ulcer 08/09/1987   Jul 28, 2008 Entered By: KATHERINE ETHA HERO Comment: s/p partial gastrectomy    Assessment: IV heparin  for BL DVTs.  Not on anticoagulation PTA.  - Baseline Hgb 10.4, Plts WNL  02/21/2024: Initial heparin  level 0.12; sub-therapeutic on IV heparin  1150 units/hr CBC: Hg slightly decreased to 937, pltc remains stable/WNL No bleeding or infusion related concerns reported by RN  Goal of Therapy:  Heparin  level 0.3-0.7 units/ml Monitor platelets by anticoagulation protocol: Yes   Plan:  Re-bolus IV heparin  2000 unit IV x1 Increase Heparin  infusion to 1400 units/hr Check heparin  level in 6-8 hrs Daily HL and CBC while on heparin  F/U plans for long-term anticoagulation  Rosaline Millet PharmD 02/21/2024,3:33 AM

## 2024-02-21 NOTE — Consult Note (Signed)
 NAME:  Cody Glenn, MRN:  [id number], DOB:  1955/10/05, LOS: 1 ADMISSION DATE:  02/20/2024, CONSULTATION DATE:  02/20/24 REFERRING MD:  ED CHIEF COMPLAINT:  Cavitary Lung Lesion   History of Present Illness:  Cody Glenn is a 68 year old male with history of alcohol abuse who presented to the ED with bilateral lower extremity edema. He drinks a gallon of beer per day.   CT Chest with contrast showed emphysematous disease, a left upper lung cavitary lesion measuring 7.4 x 4.1cm with solid component concerning for malignancy or possibly aspergilloma. There is peripheral/basilar predominant honey combing concerning for pulmonary fibrosis.   He reports productive cough with green sputum with occasional blood tinged sputum. He reports loss of 7 lbs of weight. He does report some sweats.  He does smoke 3 packs per week. He has been smoking cigarettes since age 89. He drinks alcohol daily. Denies other drug use. He is currently homeless and came from a homeless shelter. He was incarcerated for a few years in the 1990s. No known TB exposure.   Pertinent  Medical History   Past Medical History:  Diagnosis Date   Alcohol use    Hyperbilirubinemia 07/01/2020   Peptic ulcer 08/09/1987   Jul 28, 2008 Entered By: KATHERINE ETHA HERO Comment: s/p partial gastrectomy   Significant Hospital Events: Including procedures, antibiotic start and stop dates in addition to other pertinent events   7/15 admitted to hospital, PCCM consulted 7/15 CT scan of the chest without any mediastinal or hilar adenopathy.  Significant emphysematous disease bilaterally.  Left upper lobe 7.4 x 4.1 cm cavitary lesion with a peripheral solid component and central opacity suggestive of a mycetoma  Interim History / Subjective:   No results back on QuantGold, Fungitell, aspergillus Ab No sputa collected   Ur Na < 10 Ur Osm 209 Na 118 > 120 > 121 WBC 8.3  Objective    Blood pressure 136/68, pulse (!) 46, temperature  98.1 F (36.7 C), temperature source Oral, resp. rate 12, height 5' 5 (1.651 m), weight 69 kg, SpO2 100%.        Intake/Output Summary (Last 24 hours) at 02/21/2024 0846 Last data filed at 02/21/2024 0600 Gross per 24 hour  Intake 2006.63 ml  Output 670 ml  Net 1336.63 ml   Filed Weights   02/20/24 1205 02/20/24 2134  Weight: 66 kg 69 kg   Examination: General: Elderly gentleman laying in bed in no distress on room air HENT: Oropharynx clear, strong voice, no secretions Lungs: Distant but clear bilaterally without any wheezing Cardiovascular: Regular, no murmur Abdomen: Nondistended with positive bowel sounds Extremities: 1+ bilateral pedal edema Neuro: Awake, alert, interacting appropriately, answers questions and follows commands.   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  There is a central opacity in the cavity consistent with a mycetoma. - Agree with empiric broad-spectrum antibiotics - QuantiFERON gold, Fungitell testing are pending - Planning for bronchoscopy today 7/16 for culture data, possibly transbronchial biopsies if more solid component of lesion is easily accessible and visible on fluoroscopy.  Will hold his heparin  in case biopsies are possible.  Restart postprocedure - Echocardiogram ordered and pending - Agree with management of his alcohol withdrawal, hyponatremia   Best Practice (right click and  Reselect all SmartList Selections daily)   Diet/type: Regular consistency (see orders), NPO after midnight DVT prophylaxis SCD Pressure ulcer(s): N/A GI prophylaxis: N/A Lines: N/A Foley:  N/A Code Status:  full code Last date of  multidisciplinary goals of care discussion [Per primary]  Labs   CBC: Recent Labs  Lab 02/20/24 1223 02/21/24 0139  WBC 10.8* 8.3  NEUTROABS 8.3*  --   HGB 10.4* 9.7*  HCT 29.8* 28.2*  MCV 88.7 90.7  PLT 296 300    Basic Metabolic Panel: Recent Labs  Lab 02/20/24 1223 02/20/24 1653 02/20/24 2200  NA 118* 120* 121*  K 3.9 3.6 3.8  CL 82* 86* 87*  CO2 22 25 22   GLUCOSE 88 118* 103*  BUN 7* 6* 7*  CREATININE <0.30* 0.47* 0.45*  CALCIUM 9.1 8.2* 8.5*   GFR: Estimated Creatinine Clearance: 77.9 mL/min (A) (by C-G formula based on SCr of 0.45 mg/dL (L)). Recent Labs  Lab 02/20/24 1223 02/21/24 0139  WBC 10.8* 8.3    Liver Function Tests: Recent Labs  Lab 02/20/24 1223  AST 28  ALT 14  ALKPHOS 75  BILITOT 1.8*  PROT 7.6  ALBUMIN 3.1*   No results for input(s): LIPASE, AMYLASE in the last 168 hours. No results for input(s): AMMONIA in the last 168 hours.  ABG    Component Value Date/Time   TCO2 23 07/21/2020 1747     Coagulation Profile: Recent Labs  Lab 02/20/24 1723  INR 1.0    Cardiac Enzymes: No results for input(s): CKTOTAL, CKMB, CKMBINDEX, TROPONINI in the last 168 hours.  HbA1C: Hgb A1c MFr Bld  Date/Time Value Ref Range Status  10/13/2020 02:08 PM 5.5 4.8 - 5.6 % Final    Comment:             Prediabetes: 5.7 - 6.4          Diabetes: >6.4          Glycemic control for adults with diabetes: <7.0     Critical care time: n/a    Lamar Chris, MD, PhD 02/21/2024, 8:47 AM [facility] [phone] or if no answer before 7:00PM call [phone] For any issues after 7:00PM please call eLink [phone]

## 2024-02-21 NOTE — Progress Notes (Signed)
*  PRELIMINARY RESULTS* Echocardiogram 2D Echocardiogram has been performed.  Cody Glenn Stallion 02/21/2024, 9:02 AM

## 2024-02-21 NOTE — Progress Notes (Addendum)
 PHARMACY - ANTICOAGULATION CONSULT NOTE  Pharmacy Consult for Heparin  Indication: BL DVT  No Known Allergies  Patient Measurements: Height: 5' 5 (165.1 cm) Weight: 69 kg (152 lb 1.9 oz) IBW/kg (Calculated) : 61.5 HEPARIN  DW (KG): 69  Vital Signs: Temp: 97 F (36.1 C) (07/16 1434) Temp Source: Temporal (07/16 1434) BP: 106/68 (07/16 1434) Pulse Rate: 67 (07/16 1434)  Labs: Recent Labs    02/20/24 1223 02/20/24 1653 02/20/24 1723 02/20/24 2200 02/21/24 0139  HGB 10.4*  --   --   --  9.7*  HCT 29.8*  --   --   --  28.2*  PLT 296  --   --   --  300  APTT  --   --  31  --   --   LABPROT  --   --  13.6  --   --   INR  --   --  1.0  --   --   HEPARINUNFRC  --   --   --   --  0.12*  CREATININE <0.30* 0.47*  --  0.45*  --     Estimated Creatinine Clearance: 77.9 mL/min (A) (by C-G formula based on SCr of 0.45 mg/dL (L)).   Medical History: Past Medical History:  Diagnosis Date   Alcohol use    Hyperbilirubinemia 07/01/2020   Peptic ulcer 08/09/1987   Jul 28, 2008 Entered By: KATHERINE ETHA HERO Comment: s/p partial gastrectomy    Assessment: IV heparin  for BL DVTs.  Not on anticoagulation PTA.    02/21/2024: Bronchoscopy and biopsies performed this afternoon Hgb down to 9.7, platelets WNL  Goal of Therapy:  Heparin  level 0.3-0.7 units/ml Monitor platelets by anticoagulation protocol: Yes   Plan:  Resume IV Heparin  infusion at 1400 units/hr two hours after bronchoscopy per PCCM Check heparin  level in 6-8 hrs Daily HL and CBC while on heparin  F/U plans for long-term anticoagulation    Thank you for allowing pharmacy to be a part of this patient's care.  Eleanor EMERSON Agent, PharmD, BCPS Clinical Pharmacist [facility] 02/21/2024 2:38 PM

## 2024-02-21 NOTE — Plan of Care (Signed)
   Problem: Activity: Goal: Risk for activity intolerance will decrease Outcome: Progressing   Problem: Coping: Goal: Level of anxiety will decrease Outcome: Progressing   Problem: Elimination: Goal: Will not experience complications related to urinary retention Outcome: Progressing

## 2024-02-21 NOTE — Anesthesia Procedure Notes (Signed)
 Procedure Name: Intubation Date/Time: 02/21/2024 1:42 PM  Performed by: Zaydah Nawabi D, CRNAPre-anesthesia Checklist: Patient identified, Emergency Drugs available, Suction available and Patient being monitored Patient Re-evaluated:Patient Re-evaluated prior to induction Oxygen Delivery Method: Circle system utilized Preoxygenation: Pre-oxygenation with 100% oxygen Induction Type: IV induction and Rapid sequence Laryngoscope Size: Mac and 4 Grade View: Grade I Tube type: Oral Tube size: 8.0 mm Number of attempts: 1 Airway Equipment and Method: Stylet and Oral airway Placement Confirmation: ETT inserted through vocal cords under direct vision, positive ETCO2 and breath sounds checked- equal and bilateral Secured at: 22 cm Tube secured with: Tape Dental Injury: Teeth and Oropharynx as per pre-operative assessment

## 2024-02-21 NOTE — Op Note (Signed)
 Video Bronchoscopy Procedure Note  Date of Operation: 02/21/2024  Pre-op Diagnosis: Left upper lobe cavitary mass  Post-op Diagnosis: Same  Surgeon: LAMAR CHRIS  Assistants: none  Anesthesia: General  Operation: Flexible video fiberoptic bronchoscopy with BAL and biopsies.  Estimated Blood Loss: 0 cc  Complications: none noted  Indications and History: Cody Glenn is 68 y.o. with history of alcohol abuse, tobacco abuse.  He has been having purulent sputum, some scant hemoptysis mixed in with his mucus.  CT imaging revealed a large cavitary right upper lobe lesion with an apparent mycetoma.  Recommendation was to perform video fiberoptic bronchoscopy with biopsies and bronchoalveolar lavage. The risks, benefits, complications, treatment options and expected outcomes were discussed with the patient.  The possibilities of pneumothorax, pneumonia, reaction to medication, pulmonary aspiration, perforation of a viscus, bleeding, failure to diagnose a condition and creating a complication requiring transfusion or operation were discussed with the patient who freely signed the consent.    Description of Procedure: The patient was seen in the Preoperative Area, was examined and was deemed appropriate to proceed.  The patient was taken to [facility] endoscopy room 4, identified as KEVON TENCH and the procedure verified as Flexible Video Fiberoptic Bronchoscopy.  A Time Out was held and the above information confirmed.   General anesthesia was initiated the patient was endotracheally intubated.  The video fiberoptic bronchoscope was introduced via endotracheal tube and a general inspection was performed which showed normal distal trachea, normal main carina. The R sided airways were inspected and showed normal RUL, BI, RML and RLL. The L side was then inspected. The LLL, Lingular and LUL airways were normal.  There were no endobronchial lesions, no secretions.  There was no fresh blood or  evidence for prior hemoptysis seen.   Bronchioloalveolar lavage was performed in the apicoposterior segment of the left upper lobe with approximately 80 cc of normal saline instilled and 35 cc returned.  This will be sent for microbiology (bacterial, mycobacterial, fungal) and cytology.  Under fluoroscopic guidance transbronchial brushings were performed in the left upper lobe to be sent for cytology.  Finally under fluoroscopic guidance transbronchial forceps biopsies were performed in the left upper lobe to be sent for cytopathology.  The patient tolerated the procedure well. The bronchoscope was removed. There were no obvious complications.   Samples: 1.  Bronchoalveolar lavage from the apicoposterior segment of the left upper lobe 2.  Transbronchial brushings from the left upper lobe 3.  Transbronchial forceps biopsies from the left upper lobe   Plans:  We will review the cytology, pathology and microbiology results with the patient when they become available.  He will be transferred back to his hospital room after chest x-ray and after recovery from general anesthesia.   LAMAR CHRIS, MD, PhD 02/21/2024, 2:17 PM [facility] [phone] or if no answer [phone]

## 2024-02-21 NOTE — Progress Notes (Signed)
 This RN noticed pt's HR to get as low as 48. NP, Lynwood Kipper notified. No new orders. Pt resting comfortably at this time, no physical sign of distress, breathing is regular, unlabored, and symmetrical.

## 2024-02-21 NOTE — Progress Notes (Addendum)
 [facility] Progress Note  Patient: Cody Glenn     FMW:[id number]  DOA: 02/20/2024   PCP: [name], MD       Brief hospital course: This is a 68 year old male with history of heavy alcohol use, peptic ulcer disease, partial gastrectomy, hypertension, who presents to the hospital for bilateral lower extremity edema with complaints of dyspnea, cough productive of green sputum and occasional hemoptysis. In the ED: Patient noted to have CT scan that showed 7.4 x 4.1 cm cavitary lesion in the left lung apex highly, nondisplaced right eighth rib fracture and and emphysematous disease in both lungs with bullous changes.  Subjective:  Admits to weight loss of about 10-15 lbs in the past month from loss of appetite. He has been coughing for a few weeks and has had a few days of mild hemoptysis.    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  - on Heparin  infusion-  will request a vascular surgery consult    Tobacco use - smokes about 1-2 ppd - Smoking cessation discussed - continue Nicoderm patch    Primary hypertension -Resume valsartan     Hyponatremia - Likely secondary to beer potomania - Currently asymptomatic-will continue to follow      Ascending aortic aneurysm (HCC) -Noted on imaging to be 4.1 cm - Will need outpatient  follow-up      Code Status: Full Code Total time on patient care: 35 minutes DVT prophylaxis:  SCDs Start: 02/20/24 1552     Objective:   Vitals:   02/21/24 0425 02/21/24 0500 02/21/24 0600 02/21/24 0800  BP:  (!) 144/76 136/68 (!) 142/68  Pulse:  (!) 49 (!) 46 (!) 49  Resp:  15 12 11   Temp: 98.1 F (36.7 C)   98.2 F (36.8 C)  TempSrc: Oral   Oral  SpO2:  100% 100% 100%  Weight:      Height:       Filed Weights   02/20/24 1205 02/20/24 2134  Weight: 66 kg 69 kg   Exam: General exam: Appears comfortable  HEENT: oral mucosa moist Respiratory system: Clear to auscultation.  Cardiovascular system: S1 & S2 heard  Gastrointestinal system: Abdomen soft, non-tender, nondistended. Normal bowel sounds   Extremities: No cyanosis, clubbing or edema Psychiatry:  Mood & affect appropriate.     CBC: Recent Labs  Lab 02/20/24 1223 02/21/24 0139  WBC 10.8* 8.3  NEUTROABS 8.3*  --   HGB 10.4* 9.7*  HCT 29.8* 28.2*  MCV 88.7 90.7  PLT 296 300   Basic Metabolic Panel: Recent Labs  Lab 02/20/24 1223 02/20/24 1653 02/20/24 2200  NA 118* 120* 121*  K 3.9 3.6 3.8  CL 82* 86* 87*  CO2 22 25 22   GLUCOSE 88 118* 103*  BUN 7* 6* 7*  CREATININE <0.30* 0.47* 0.45*  CALCIUM 9.1 8.2* 8.5*     Scheduled Meds:  Chlorhexidine  Gluconate  Cloth  6 each Topical Daily   folic acid   1 mg Oral Daily   multivitamin with minerals  1 tablet Oral Daily   nicotine   14 mg Transdermal Daily   [START ON 02/23/2024] phenobarbital   16.2 mg Oral BID   [START ON 02/22/2024] phenobarbital   32.4 mg Oral BID   phenobarbital   64.8 mg Oral BID   thiamine   100 mg Oral Daily   Or   thiamine   100 mg Intravenous Daily    Imaging and lab data personally reviewed   Author: Isela Stantz  02/21/2024 9:46 AM  To contact [facility]>   Check the care team in [facility] and look for the attending/consulting [facility] provider listed  Log into [email or web address] and use [place]'s universal password   Go to>  [facility]  and find provider  If you still have difficulty reaching the provider, please page the [facility] (Director on Call) for the Hospitalists listed on amion

## 2024-02-21 NOTE — Anesthesia Postprocedure Evaluation (Signed)
 Anesthesia Post Note  Patient: Cody Glenn  Procedure(s) Performed: BRONCHOSCOPY, FLEXIBLE (Left)     Patient location during evaluation: PACU Anesthesia Type: General Level of consciousness: awake and alert Pain management: pain level controlled Vital Signs Assessment: post-procedure vital signs reviewed and stable Respiratory status: spontaneous breathing, nonlabored ventilation, respiratory function stable and patient connected to nasal cannula oxygen Cardiovascular status: blood pressure returned to baseline and stable Postop Assessment: no apparent nausea or vomiting Anesthetic complications: no   No notable events documented.  Last Vitals:  Vitals:   02/21/24 1440 02/21/24 1450  BP: 102/72 105/77  Pulse: (!) 59 63  Resp: 17 18  Temp:    SpO2: 99% 98%    Last Pain:  Vitals:   02/21/24 1450  TempSrc:   PainSc: 0-No pain                 Garnette DELENA Gab

## 2024-02-22 ENCOUNTER — Encounter (HOSPITAL_COMMUNITY): Payer: Self-pay | Admitting: Emergency Medicine

## 2024-02-22 DIAGNOSIS — E871 Hypo-osmolality and hyponatremia: Secondary | ICD-10-CM | POA: Diagnosis not present

## 2024-02-22 DIAGNOSIS — J984 Other disorders of lung: Secondary | ICD-10-CM | POA: Diagnosis not present

## 2024-02-22 LAB — BASIC METABOLIC PANEL WITH GFR
Anion gap: 9 (ref 5–15)
BUN: 9 mg/dL (ref 8–23)
CO2: 21 mmol/L — ABNORMAL LOW (ref 22–32)
Calcium: 8.4 mg/dL — ABNORMAL LOW (ref 8.9–10.3)
Chloride: 98 mmol/L (ref 98–111)
Creatinine, Ser: 0.62 mg/dL (ref 0.61–1.24)
GFR, Estimated: >60 mL/min (ref >60)
Glucose, Bld: 86 mg/dL (ref 70–99)
Potassium: 3.7 mmol/L (ref 3.5–5.1)
Sodium: 128 mmol/L — ABNORMAL LOW (ref 135–145)

## 2024-02-22 LAB — CBC
HCT: 25.9 % — ABNORMAL LOW (ref 39.0–52.0)
Hemoglobin: 8.5 g/dL — ABNORMAL LOW (ref 13.0–17.0)
MCH: 31.1 pg (ref 26.0–34.0)
MCHC: 32.8 g/dL (ref 30.0–36.0)
MCV: 94.9 fL (ref 80.0–100.0)
Platelets: 252 K/uL (ref 150–400)
RBC: 2.73 MIL/uL — ABNORMAL LOW (ref 4.22–5.81)
RDW: 14.9 % (ref 11.5–15.5)
WBC: 9.7 K/uL (ref 4.0–10.5)
nRBC: 0 % (ref 0.0–0.2)

## 2024-02-22 LAB — HEPARIN LEVEL (UNFRACTIONATED)
Heparin Unfractionated: 0.28 [IU]/mL — ABNORMAL LOW (ref 0.30–0.70)
Heparin Unfractionated: 0.69 [IU]/mL (ref 0.30–0.70)

## 2024-02-22 MED ORDER — ENOXAPARIN SODIUM 40 MG/0.4ML IJ SOSY
40.0000 mg | PREFILLED_SYRINGE | INTRAMUSCULAR | Status: DC
  Administered 2024-02-22 – 2024-02-27 (×6): 40 mg via SUBCUTANEOUS
  Filled 2024-02-22 (×6): qty 0.4

## 2024-02-22 MED ORDER — ENSURE PLUS HIGH PROTEIN PO LIQD: 237.0000 mL | Freq: Two times a day (BID) | ORAL | Status: DC

## 2024-02-22 MED ORDER — LOPERAMIDE HCL 2 MG PO CAPS
2.0000 mg | ORAL_CAPSULE | Freq: Four times a day (QID) | ORAL | Status: DC | PRN
  Administered 2024-02-22 – 2024-02-23 (×2): 2 mg via ORAL
  Filled 2024-02-22 (×3): qty 1

## 2024-02-22 NOTE — Progress Notes (Signed)
 PHARMACY - ANTICOAGULATION CONSULT NOTE  Pharmacy Consult for Heparin  Indication: BL DVT  No Known Allergies  Patient Measurements: Height: 5' 5 (165.1 cm) Weight: 69 kg (152 lb 1.9 oz) IBW/kg (Calculated) : 61.5 HEPARIN  DW (KG): 69  Vital Signs: Temp: 97.6 F (36.4 C) (07/16 2305) Temp Source: Oral (07/16 2305) BP: 156/83 (07/17 0600) Pulse Rate: 66 (07/17 0600)  Labs: Recent Labs    02/20/24 1223 02/20/24 1653 02/20/24 1723 02/20/24 2200 02/21/24 0139 02/21/24 2322 02/22/24 0302 02/22/24 0648  HGB 10.4*  --   --   --  9.7*  --  8.5*  --   HCT 29.8*  --   --   --  28.2*  --  25.9*  --   PLT 296  --   --   --  300  --  252  --   APTT  --   --  31  --   --   --   --   --   LABPROT  --   --  13.6  --   --   --   --   --   INR  --   --  1.0  --   --   --   --   --   HEPARINUNFRC  --   --   --   --  0.12* 0.28*  --  0.69  CREATININE <0.30* 0.47*  --  0.45*  --   --   --   --     Estimated Creatinine Clearance: 77.9 mL/min (A) (by C-G formula based on SCr of 0.45 mg/dL (L)).   Medical History: Past Medical History:  Diagnosis Date   Alcohol use    Hyperbilirubinemia 07/01/2020   Peptic ulcer 08/09/1987   Jul 28, 2008 Entered By: KATHERINE ETHA HERO Comment: s/p partial gastrectomy    Assessment: IV heparin  for BL DVTs.  Not on anticoagulation PTA.  7/17: Bronchoscopy and biopsies completed; heparin  resumed 2h post bronch  02/22/2024: Heparin  level now therapeutic on IV heparin  1500 units/hr Hgb down to 8.5, platelets 252 stab;e No bleeding or infusion related concerns reported by RN  Goal of Therapy:  Heparin  level 0.3-0.7 units/ml Monitor platelets by anticoagulation protocol: Yes   Plan:  Continue IV Heparin  at current rate of 1500 units/hr  Recheck heparin  level in 6 hours for confirmation Daily HL and CBC while on heparin  F/U plans for long-term anticoagulation   Eva HERO Allis, PharmD, BCPS Secure Chat if ?s 02/22/2024 7:38 AM

## 2024-02-22 NOTE — TOC Initial Note (Signed)
 Transition of Care [facility]) - Initial/Assessment Note    Patient Details  Name: Cody Glenn MRN: [id number] Date of Birth: [birth year]  [facility] CM/SW Contact:    Jon ONEIDA Anon, RN Phone Number: 02/22/2024, 2:24 PM  Clinical Narrative:                 NCM met with pt at bedside and he states that he is currently homeless, and has been living from place to place prior to admission. Pt states that he does have a brother, but is unable to reside with him. When asked if he wanted NCM to contact brother, he declined. Pt states that he has a walker and a cane. Discussed substance abuse resources and pt is open to residential substance abuse options. Resources added to AVS for substance abuse, food and  housing instability. Awaiting PT eval. TOC is following.   Expected Discharge Plan: Homeless Shelter Barriers to Discharge: Continued Medical Work up   Patient Goals and CMS Choice Patient states their goals for this hospitalization and ongoing recovery are:: To get better and find housing CMS Medicare.gov Compare Post Acute Care list provided to:: Other (Comment Required) (NA) Choice offered to / list presented to : NA [place] ownership interest in [facility].provided to:: Parent NA    Expected Discharge Plan and Services In-house Referral: Clinical Social Work Discharge Planning Services: CM Consult Post Acute Care Choice: NA Living arrangements for the past 2 months: Homeless                 DME Arranged: N/A DME Agency: NA       HH Arranged: NA HH Agency: NA        Prior Living Arrangements/Services Living arrangements for the past 2 months: Homeless Lives with:: Self Patient language and need for interpreter reviewed:: Yes Do you feel safe going back to the place where you live?:  (Pt currently homeless)      Need for Family Participation in Patient Care: No (Comment) Care giver support system in place?: No (comment) Current home  services: DME (Walker, cane) Criminal Activity/Legal Involvement Pertinent to Current Situation/Hospitalization: No - Comment as needed  Activities of Daily Living   ADL Screening (condition at time of admission) Independently performs ADLs?: Yes (appropriate for developmental age) Is the patient deaf or have difficulty hearing?: No Does the patient have difficulty seeing, even when wearing glasses/contacts?: No Does the patient have difficulty concentrating, remembering, or making decisions?: No  Permission Sought/Granted Permission sought to share information with : Case Manager Permission granted to share information with : Yes, Verbal Permission Granted  Share Information with NAME: Case Manager           Emotional Assessment Appearance:: Appears stated age Attitude/Demeanor/Rapport: Engaged Affect (typically observed): Appropriate Orientation: : Oriented to Self, Oriented to Place, Oriented to  Time, Oriented to Situation Alcohol / Substance Use: Alcohol Use Psych Involvement: No (comment)  Admission diagnosis:  Alcohol abuse [F10.10] Hyponatremia [E87.1] Cavitary lesion of lung [J98.4] Thoracic aortic aneurysm without rupture, unspecified part (HCC) [I71.20] Patient Active Problem List   Diagnosis Date Noted   Hyponatremia 02/20/2024   Normocytic anemia 02/20/2024   Mild protein malnutrition (HCC) 02/20/2024   Acute respiratory failure with hypoxia (HCC) 02/20/2024   Emphysema/COPD (HCC) 02/20/2024   Cavitary lesion of lung 02/20/2024   Ascending aortic aneurysm (HCC) 02/20/2024   Coronary artery calcification 02/20/2024   Aortic atherosclerosis (HCC) 02/20/2024   Closed fracture of eight ribs  of right side 02/20/2024   DVT of lower extremity, bilateral (HCC) 02/20/2024   Osteoarthritis 01/21/2024   Prediabetes 01/21/2024   Problem related to housing and economic circumstances, unspecified 01/21/2024   Other B-complex deficiencies 10/13/2020   Sheltered  homelessness 10/13/2020   Primary hypertension 10/13/2020   Cervical stenosis of spine 08/24/2020   Alcohol use 06/29/2020   Tobacco use 06/29/2020   Peptic ulcer without hemorrhage, perforation, or obstruction 08/09/1987   PCP:  [name], MD Pharmacy:   [facility] [address] [place] [address] Phone: [phone] Fax: [phone]     Social Drivers of Health (SDOH) Social History: SDOH Screenings   Food Insecurity: Food Insecurity Present (02/21/2024)  Housing: High Risk (02/21/2024)  Transportation Needs: No Transportation Needs (02/21/2024)  Utilities: Not At Risk (02/21/2024)  Depression (PHQ2-9): Low Risk  (10/13/2020)  Social Connections: Moderately Integrated (02/21/2024)  Tobacco Use: High Risk (02/21/2024)   SDOH Interventions: Food Insecurity Interventions: [employer] Provided, Inpatient TOC Housing Interventions: Community Resources Provided, Inpatient TOC   Readmission Risk Interventions    02/22/2024    2:07 PM  Readmission Risk Prevention Plan  Post Dischage Appt Complete  Medication Screening Complete  Transportation Screening Complete

## 2024-02-22 NOTE — Progress Notes (Signed)
 PHARMACY - ANTICOAGULATION CONSULT NOTE  Pharmacy Consult for Heparin  Indication: BL DVT  No Known Allergies  Patient Measurements: Height: 5' 5 (165.1 cm) Weight: 69 kg (152 lb 1.9 oz) IBW/kg (Calculated) : 61.5 HEPARIN  DW (KG): 69  Vital Signs: Temp: 97.6 F (36.4 C) (07/16 2305) Temp Source: Oral (07/16 2305) BP: 131/62 (07/16 2100) Pulse Rate: 83 (07/16 2100)  Labs: Recent Labs    02/20/24 1223 02/20/24 1653 02/20/24 1723 02/20/24 2200 02/21/24 0139 02/21/24 2322  HGB 10.4*  --   --   --  9.7*  --   HCT 29.8*  --   --   --  28.2*  --   PLT 296  --   --   --  300  --   APTT  --   --  31  --   --   --   LABPROT  --   --  13.6  --   --   --   INR  --   --  1.0  --   --   --   HEPARINUNFRC  --   --   --   --  0.12* 0.28*  CREATININE <0.30* 0.47*  --  0.45*  --   --     Estimated Creatinine Clearance: 77.9 mL/min (A) (by C-G formula based on SCr of 0.45 mg/dL (L)).   Medical History: Past Medical History:  Diagnosis Date   Alcohol use    Hyperbilirubinemia 07/01/2020   Peptic ulcer 08/09/1987   Jul 28, 2008 Entered By: KATHERINE ETHA HERO Comment: s/p partial gastrectomy    Assessment: IV heparin  for BL DVTs.  Not on anticoagulation PTA.  7/17: Bronchoscopy and biopsies completed; heparin  resumed 2h post bronch  02/22/2024: Heparin  level 0.28- subtherapeutic on IV heparin  1400 units/hr Hgb down to 9.7, platelets WNL No bleeding or infusion related concerns reported by RN  Goal of Therapy:  Heparin  level 0.3-0.7 units/ml Monitor platelets by anticoagulation protocol: Yes   Plan:  Increase IV Heparin  slightly to 1500 units/hr  Check heparin  level ~6h after rate increase Daily HL and CBC while on heparin  F/U plans for long-term anticoagulation  Thank you for allowing pharmacy to be a part of this patient's care.  Rosaline Millet, PharmD, BCPS 02/22/2024 12:10 AM

## 2024-02-22 NOTE — Discharge Instructions (Signed)
 FOOD PANTRY [facility] [address] [phone]  [facility] [address] [place] [phone]  [facility] [address] [place] [phone]  [facility] [address] [phone]  [facility] [address] [place], [place] [address] [phone]

## 2024-02-22 NOTE — Progress Notes (Signed)
 PCCM interval note  Underwent bronchoscopy 7/16  BAL for culture, cytology, transbronchial brushings and biopsies from the left upper lobe performed, all pending.  QuantiFERON gold pending as well  Will continue to follow with you for results.   Lamar Chris, MD, PhD 02/22/2024, 7:41 AM  Pulmonary and Critical Care [phone] or if no answer before 7:00PM call [phone] For any issues after 7:00PM please call eLink [phone]

## 2024-02-22 NOTE — Progress Notes (Signed)
 [facility] Progress Note  Patient: Cody Glenn     FMW:[id number]  DOA: 02/20/2024   PCP: [name], MD       Brief hospital course: This is a 68 year old male with history of heavy alcohol use, peptic ulcer disease, partial gastrectomy, hypertension, who presents to the hospital for bilateral lower extremity edema with complaints of dyspnea, cough productive of green sputum and occasional hemoptysis. 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  Subjective:  Patient states that cough has improved.  No hemoptysis.  No complaints of fevers.  Assessment and Plan: Principal Problem:   Cavitary lesion of lung with acute respiratory failure - He was initially was found to have a pulse ox of 80s on room air and required a non re breather in the ED - Being treated with Zosyn  and Vancomycin  for possible underlying infection - Pulmonary consulted for assistance with management - 7/16-underwent bronchoscopy with BAL, brushings and biopsies - Has been weaned to room air and pulse ox is at 100% - Although he does not have pulmonary function test to evaluate him for COPD, imaging is suggestive of emphysema-can follow-up as outpatient with pulmonary for PFTs  Active Problems:   Alcohol use Cirrhosis of the liver - Drinks large amounts of beer daily - Started on phenobarbital -no signs or symptoms of withdrawal so far - Continue folate, multivitamin and thiamine  - Have discussed alcohol cessation with him  Lower extremity edema Chronic lower extremity venous thromboembolism -Vascular ultrasound reveals bilateral chronic deep venous thrombosis in the common femoral,  femoral and  popliteal veins  -Patient states that he previously had pedal edema but this appears to have resolved in the hospital - appears that  his clots are chronic- stop Heparin  infusion - Place teds and encourage early ambulation    Tobacco use - Admits to smoking about 1-2 ppd - Smoking cessation discussed - continue Nicoderm patch    Primary hypertension - Continue e valsartan     Hyponatremia - Likely secondary to beer potomania - Currently asymptomatic - Sodium steadily improving     Ascending aortic aneurysm (HCC) -Noted on imaging to be 4.1 cm - Will need continued outpatient surveillance      Code Status: Full Code Total time on patient care: 35 minutes DVT prophylaxis:  SCDs Start: 02/20/24 1552     Objective:   Vitals:   02/22/24 0800 02/22/24 0900 02/22/24 1000 02/22/24 1100  BP: (!) 159/92 (!) 145/75 (!) 159/71 135/74  Pulse: (!) 56 (!) 44 (!) 58 70  Resp: 17 12 18 14   Temp: 97.8 F (36.6 C)     TempSrc: Oral     SpO2: 100% 100% 100% 100%  Weight:      Height:       Filed Weights   02/20/24 1205 02/20/24 2134  Weight: 66 kg 69 kg   Exam: General exam: Appears comfortable  HEENT: oral mucosa moist Respiratory system: Clear to auscultation.  Cardiovascular system: S1 & S2 heard  Gastrointestinal system: Abdomen soft, non-tender, nondistended. Normal bowel sounds   Extremities: No cyanosis, clubbing or edema Psychiatry:  Mood & affect appropriate.     CBC: Recent Labs  Lab 02/20/24 1223 02/21/24 0139 02/22/24 0302  WBC 10.8* 8.3 9.7  NEUTROABS 8.3*  --   --   HGB 10.4* 9.7* 8.5*  HCT 29.8* 28.2* 25.9*  MCV 88.7 90.7 94.9  PLT 296 300 252   Basic Metabolic Panel: Recent Labs  Lab 02/20/24 1223 02/20/24 1653 02/20/24 2200 02/22/24 0640  NA 118* 120* 121* 128*  K 3.9 3.6 3.8 3.7  CL 82* 86* 87* 98  CO2 22 25 22  21*  GLUCOSE 88 118* 103* 86  BUN 7* 6* 7* 9  CREATININE <0.30* 0.47* 0.45* 0.62  CALCIUM 9.1 8.2* 8.5* 8.4*     Scheduled Meds:  Chlorhexidine  Gluconate Cloth  6 each Topical Daily   folic acid   1 mg Oral Daily   multivitamin with minerals  1 tablet Oral  Daily   nicotine   14 mg Transdermal Daily   [START ON 02/23/2024] phenobarbital   16.2 mg Oral BID   phenobarbital   32.4 mg Oral BID   thiamine   100 mg Oral Daily   Or   thiamine   100 mg Intravenous Daily    Imaging and lab data personally reviewed   Author: Peggie Hornak  02/22/2024 11:26 AM  To contact [facility]>   Check the care team in [facility] and look for the attending/consulting [facility] provider listed  Log into [email or web address] and use [place] universal password   Go to> [facility]  and find provider  If you still have difficulty reaching the provider, please page the [facility] (Director on Call) for the Hospitalists listed on amion

## 2024-02-23 DIAGNOSIS — F1020 Alcohol dependence, uncomplicated: Secondary | ICD-10-CM | POA: Diagnosis not present

## 2024-02-23 DIAGNOSIS — E871 Hypo-osmolality and hyponatremia: Secondary | ICD-10-CM | POA: Diagnosis not present

## 2024-02-23 DIAGNOSIS — J984 Other disorders of lung: Secondary | ICD-10-CM | POA: Diagnosis not present

## 2024-02-23 DIAGNOSIS — F17210 Nicotine dependence, cigarettes, uncomplicated: Secondary | ICD-10-CM | POA: Diagnosis not present

## 2024-02-23 LAB — BASIC METABOLIC PANEL WITH GFR
Anion gap: 7 (ref 5–15)
BUN: 8 mg/dL (ref 8–23)
CO2: 20 mmol/L — ABNORMAL LOW (ref 22–32)
Calcium: 8.3 mg/dL — ABNORMAL LOW (ref 8.9–10.3)
Chloride: 103 mmol/L (ref 98–111)
Creatinine, Ser: 0.88 mg/dL (ref 0.61–1.24)
GFR, Estimated: >60 mL/min (ref >60)
Glucose, Bld: 109 mg/dL — ABNORMAL HIGH (ref 70–99)
Potassium: 3.1 mmol/L — ABNORMAL LOW (ref 3.5–5.1)
Sodium: 130 mmol/L — ABNORMAL LOW (ref 135–145)

## 2024-02-23 LAB — CBC
HCT: 22.8 % — ABNORMAL LOW (ref 39.0–52.0)
Hemoglobin: 7.6 g/dL — ABNORMAL LOW (ref 13.0–17.0)
MCH: 31.3 pg (ref 26.0–34.0)
MCHC: 33.3 g/dL (ref 30.0–36.0)
MCV: 93.8 fL (ref 80.0–100.0)
Platelets: 242 K/uL (ref 150–400)
RBC: 2.43 MIL/uL — ABNORMAL LOW (ref 4.22–5.81)
RDW: 15.2 % (ref 11.5–15.5)
WBC: 7.4 K/uL (ref 4.0–10.5)
nRBC: 0 % (ref 0.0–0.2)

## 2024-02-23 MED ORDER — POTASSIUM CHLORIDE CRYS ER 20 MEQ PO TBCR
40.0000 meq | EXTENDED_RELEASE_TABLET | ORAL | Status: AC
  Administered 2024-02-23 (×2): 40 meq via ORAL
  Filled 2024-02-23 (×2): qty 2

## 2024-02-23 NOTE — Progress Notes (Signed)
   NAME:  Cody Glenn, MRN:  [id number], DOB:  Feb 16, 1956, LOS: 3 ADMISSION DATE:  02/20/2024, CONSULTATION DATE:  02/20/24 REFERRING MD:  ED, CHIEF COMPLAINT:  cavitary lung disease   History of Present Illness:  Cody Glenn is a 68 year old male with history of alcohol abuse who presented to the ED with bilateral lower extremity edema. He drinks a gallon of beer per day.    CT Chest with contrast showed emphysematous disease, a left upper lung cavitary lesion measuring 7.4 x 4.1cm with solid component concerning for malignancy or possibly aspergilloma. There is peripheral/basilar predominant honey combing concerning for pulmonary fibrosis.    He reports productive cough with green sputum with occasional blood tinged sputum. He reports loss of 7 lbs of weight. He does report some sweats.   He does smoke 3 packs per week. He has been smoking cigarettes since age 57. He drinks alcohol daily. Denies other drug use. He is currently homeless and came from a homeless shelter. He was incarcerated for a few years in the 1990s. No known TB exposure.     Pertinent  Medical History   Past Medical History:  Diagnosis Date   Alcohol use    Hyperbilirubinemia 07/01/2020   Peptic ulcer 08/09/1987   Jul 28, 2008 Entered By: KATHERINE ETHA HERO Comment: s/p partial gastrectomy   Significant Hospital Events: Including procedures, antibiotic start and stop dates in addition to other pertinent events   7/15 admitted to hospital, PCCM consulted 7/15 CT scan of the chest without any mediastinal or hilar adenopathy.  Significant emphysematous disease bilaterally.  Left upper lobe 7.4 x 4.1 cm cavitary lesion with a peripheral solid component and central opacity suggestive of a mycetoma 02/21/2024-bronchoscopy performed  Interim History / Subjective:  Feels better Less coughing, less sputum production  Objective    Blood pressure 135/79, pulse (!) 55, temperature 98 F (36.7 C), temperature source  Oral, resp. rate 16, height 5' 5 (1.651 m), weight 69 kg, SpO2 100%.        Intake/Output Summary (Last 24 hours) at 02/23/2024 1127 Last data filed at 02/23/2024 0600 Gross per 24 hour  Intake 1317.13 ml  Output 1150 ml  Net 167.13 ml   Filed Weights   02/20/24 1205 02/20/24 2134  Weight: 66 kg 69 kg    Examination: General: Elderly, does not appear to be in distress HENT: Moist oral mucosa Lungs: Distant but clear breath sounds Cardiovascular: S1-S2 appreciated Abdomen: Soft, bowel sounds appreciated Extremities: Pedal edema Neuro: Awake alert interactive GU:   I reviewed last 24 h vitals and pain scores, last 48 h intake and output, last 24 h labs and trends, and last 24 h imaging results.  Resolved problem list   Assessment and Plan   Cavitary right upper lobe lesion Concern for mycetoma - S/p bronchoscopy - BAL negative so far, other cultures pending  Emphysema Possible pulmonary fibrosis - Continue bronchodilator treatments  Nicotine  dependence Alcohol dependence - Counseling  Femoral and popliteal DVTs - On anticoagulation  Continue lines of care  Will follow results from bronchoscopy

## 2024-02-23 NOTE — Plan of Care (Signed)
   Problem: Activity: Goal: Risk for activity intolerance will decrease Outcome: Progressing   Problem: Pain Managment: Goal: General experience of comfort will improve and/or be controlled Outcome: Progressing   Problem: Safety: Goal: Ability to remain free from injury will improve Outcome: Progressing

## 2024-02-23 NOTE — Progress Notes (Signed)
 [facility] Progress Note  Patient: Cody Glenn     FMW:[id number]  DOA: 02/20/2024   PCP: [name], MD       Brief hospital course: This is a 68 year old male with history of heavy alcohol use, peptic ulcer disease, partial gastrectomy, hypertension, who presents to the hospital for bilateral lower extremity edema with complaints of dyspnea, cough productive of green sputum and occasional hemoptysis. 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  Subjective:  Patient states that cough has improved.  No hemoptysis.  No complaints of fevers.  Assessment and Plan: Principal Problem:   Cavitary lesion of lung with acute respiratory failure - He was initially was found to have a pulse ox of 80s on room air and required a non re breather in the ED - Pulmonary consulted for assistance with management - 7/16-underwent bronchoscopy with BAL, brushings and biopsies - Has been weaned to room air and pulse ox is at 100% - cont Zosyn  and Vancomycin  - symptoms improving   Active Problems:   Alcohol use Cirrhosis of the liver - Drinks a gallon of beer daily - Started on phenobarbital -no signs or symptoms of withdrawal so far - Continue folate, multivitamin and thiamine  - Have discussed alcohol cessation with him  Lower extremity edema Chronic lower extremity venous thromboembolism -Vascular ultrasound reveals bilateral chronic deep venous thrombosis in the common femoral,  femoral and  popliteal veins  -Patient states that he previously had pedal edema but this appears to have resolved in the hospital - appears that his clots are chronic- stop Heparin  infusion - Place teds and encourage early ambulation    Tobacco use - Admits to smoking about 1-2 ppd - Smoking cessation discussed - continue Nicoderm  patch    Primary hypertension - Continue valsartan     Hyponatremia - Likely secondary to beer potomania - Sodium steadily improving     Ascending aortic aneurysm (HCC) -Noted on imaging to be 4.1 cm - Will need continued outpatient surveillance      Code Status: Full Code Total time on patient care: 35 minutes DVT prophylaxis:  enoxaparin  (LOVENOX ) injection 40 mg Start: 02/22/24 1400 SCDs Start: 02/20/24 1552     Objective:   Vitals:   02/22/24 2000 02/23/24 0108 02/23/24 0602 02/23/24 1450  BP: (!) 145/81 139/86 135/79 (!) 145/77  Pulse: (!) 52 (!) 52 (!) 55 73  Resp: 16 15 16 20   Temp: 97.6 F (36.4 C) 98.1 F (36.7 C) 98 F (36.7 C) 97.6 F (36.4 C)  TempSrc: Oral Oral Oral Oral  SpO2: 100% 100% 100% 100%  Weight:      Height:       Filed Weights   02/20/24 1205 02/20/24 2134  Weight: 66 kg 69 kg   Exam: General exam: Appears comfortable  HEENT: oral mucosa moist Respiratory system: Clear to auscultation.  Cardiovascular system: S1 & S2 heard  Gastrointestinal system: Abdomen soft, non-tender, nondistended. Normal bowel sounds   Extremities: No cyanosis, clubbing or edema Psychiatry:  Mood & affect appropriate.     CBC: Recent Labs  Lab 02/20/24 1223 02/21/24 0139 02/22/24 0302 02/23/24 0338  WBC 10.8* 8.3 9.7 7.4  NEUTROABS 8.3*  --   --   --   HGB 10.4* 9.7* 8.5* 7.6*  HCT 29.8* 28.2* 25.9* 22.8*  MCV 88.7  90.7 94.9 93.8  PLT 296 300 252 242   Basic Metabolic Panel: Recent Labs  Lab 02/20/24 1223 02/20/24 1653 02/20/24 2200 02/22/24 0640 02/23/24 0338  NA 118* 120* 121* 128* 130*  K 3.9 3.6 3.8 3.7 3.1*  CL 82* 86* 87* 98 103  CO2 22 25 22  21* 20*  GLUCOSE 88 118* 103* 86 109*  BUN 7* 6* 7* 9 8  CREATININE <0.30* 0.47* 0.45* 0.62 0.88  CALCIUM 9.1 8.2* 8.5* 8.4* 8.3*     Scheduled Meds:  Chlorhexidine  Gluconate Cloth  6 each Topical Daily   enoxaparin  (LOVENOX ) injection  40 mg Subcutaneous Q24H   feeding supplement  237 mL  Oral BID BM   folic acid   1 mg Oral Daily   multivitamin with minerals  1 tablet Oral Daily   nicotine   14 mg Transdermal Daily   phenobarbital   16.2 mg Oral BID   thiamine   100 mg Oral Daily   Or   thiamine   100 mg Intravenous Daily    Imaging and lab data personally reviewed   Author: Adien Kimmel  02/23/2024 5:16 PM  To contact [facility]>   Check the care team in [facility] and look for the attending/consulting [facility] provider listed  Log into [email or web address] and use 's universal password   Go to> [facility]  and find provider  If you still have difficulty reaching the provider, please page the [facility] (Director on Call) for the Hospitalists listed on amion

## 2024-02-23 NOTE — Plan of Care (Signed)

## 2024-02-23 NOTE — Evaluation (Signed)
 Physical Therapy Evaluation Patient Details Name: Cody Glenn MRN: [id number] DOB: February 24, 1956 Today's Date: 02/23/2024  History of Present Illness  Pt is 68 yo male admitted 02/20/24 with cavitary R upper lobe lesion (concern for mycetoma).  Pt s/p bronchoscopy on 02/21/24.  Pt with femoral and popliteal DVTs - on anticoagulation.  Also with emphysema and alcohol dependence with CIWA protocol.  Pt with hx including but not limited to EtOH abuse, peptic ulcer, hyperbilirubinemia  Clinical Impression  Pt admitted with above diagnosis. Pt is homeless and staying at shelter prior to admission.  He has rollator and reports had cane but has lost it.  He is normally able to ambulate in community.  Today, pt ambulating 64' in room with rollator and mild instability but no overt LOB.  He was on RA and O2 sats stable.  Pt was soiled in BM at arrival and reports he was unaware.  Assisted with cleaning.  Suspect pt likely not far from baseline.  Will benefit from PT to progress while hospitalized but likely no needs at d/c.  Pt currently with functional limitations due to the deficits listed below (see PT Problem List). Pt will benefit from acute skilled PT to increase their independence and safety with mobility to allow discharge.           If plan is discharge home, recommend the following: Help with stairs or ramp for entrance   Can travel by private vehicle        Equipment Recommendations [place] (pt request cane)  Recommendations for Other Services       Functional Status Assessment Patient has had a recent decline in their functional status and demonstrates the ability to make significant improvements in function in a reasonable and predictable amount of time.     Precautions / Restrictions Precautions Precautions: Fall      Mobility  Bed Mobility Overal bed mobility: Needs Assistance Bed Mobility: Supine to Sit     Supine to sit: Supervision          Transfers Overall transfer  level: Needs assistance Equipment used: Rollator (4 wheels) Transfers: Sit to/from Stand Sit to Stand: Contact guard assist                Ambulation/Gait Ambulation/Gait assistance: Contact guard assist Gait Distance (Feet): 80 Feet Assistive device: Rollator (4 wheels) Gait Pattern/deviations: Step-through pattern, Decreased stride length Gait velocity: decreased     General Gait Details: laps in room ; on airborne for TB rule out so stayed in room; navigated around obstacles and multiple turns; mild instability  Stairs            Wheelchair Mobility     Tilt Bed    Modified Rankin (Stroke Patients Only)       Balance Overall balance assessment: Needs assistance Sitting-balance support: No upper extremity supported Sitting balance-Leahy Scale: Good     Standing balance support: Bilateral upper extremity supported, Reliant on assistive device for balance Standing balance-Leahy Scale: Poor Standing balance comment: steady with UE support                             Pertinent Vitals/Pain Pain Assessment Pain Assessment: 0-10 Pain Score: 6  Pain Location: Reports chronic - neck and between shoudlers posterior Pain Descriptors / Indicators: Aching Pain Intervention(s): Limited activity within patient's tolerance, Monitored during session    Home Living Family/patient expects to be discharged to:: Unsure Living Arrangements: Alone  Home Equipment: Rollator (4 wheels) Additional Comments: Pt homeless and staying at shelter    Prior Function Prior Level of Function : Independent/Modified Independent             Mobility Comments: Ambulates with rollator (reports little dependence on rollator); Reports steps are hard; Could ambulate in community. States he does better with cane but has lost his. ADLs Comments: Independent adls     Extremity/Trunk Assessment   Upper Extremity Assessment Upper Extremity Assessment:  RUE deficits/detail;LUE deficits/detail;Generalized weakness RUE Deficits / Details: ROM: shoulder to about 70 degrees (chronic limitation); LUE Deficits / Details: ROM: shoulder limited to about 10 degress (chronic)    Lower Extremity Assessment Lower Extremity Assessment: Overall WFL for tasks assessed    Cervical / Trunk Assessment Cervical / Trunk Assessment: Kyphotic  Communication        Cognition Arousal: Alert Behavior During Therapy: WFL for tasks assessed/performed   PT - Cognitive impairments: No family/caregiver present to determine baseline                       PT - Cognition Comments: Overall orientation intact and able to follow commands.  However, pt had BM and was laying in bed.  States unaware he had BM         Cueing       General Comments General comments (skin integrity, edema, etc.): On RA with sats 100% throughout    Exercises     Assessment/Plan    PT Assessment Patient needs continued PT services  PT Problem List Decreased strength;Decreased range of motion;Decreased activity tolerance;Decreased balance;Decreased mobility;Decreased knowledge of use of DME;Decreased cognition       PT Treatment Interventions DME instruction;Therapeutic exercise;Gait training;Stair training;Functional mobility training;Therapeutic activities;Patient/family education;Balance training    PT Goals (Current goals can be found in the Care Plan section)  Acute Rehab PT Goals Patient Stated Goal: not stated PT Goal Formulation: With patient Time For Goal Achievement: 03/08/24 Potential to Achieve Goals: Good    Frequency Min 2X/week     Co-evaluation               AM-PAC PT 6 Clicks Mobility  Outcome Measure Help needed turning from your back to your side while in a flat bed without using bedrails?: A Little Help needed moving from lying on your back to sitting on the side of a flat bed without using bedrails?: A Little Help needed moving  to and from a bed to a chair (including a wheelchair)?: A Little Help needed standing up from a chair using your arms (e.g., wheelchair or bedside chair)?: A Little Help needed to walk in hospital room?: A Little Help needed climbing 3-5 steps with a railing? : A Lot 6 Click Score: 17    End of Session Equipment Utilized During Treatment: Gait belt Activity Tolerance: Patient tolerated treatment well Patient left: with chair alarm set;in chair;with call bell/phone within reach Nurse Communication: Mobility status;Other (comment) (Notified tech pt had BM.  PT assisted with cleaning and rolling up sheets but no linen bag.  Needs bed change when able.) PT Visit Diagnosis: Other abnormalities of gait and mobility (R26.89);Muscle weakness (generalized) (M62.81)    Time: 8766-8693 PT Time Calculation (min) (ACUTE ONLY): 33 min   Charges:   PT Evaluation $PT Eval Low Complexity: 1 Low PT Treatments $Gait Training: 8-22 mins PT General Charges $$ ACUTE PT VISIT: 1 Visit         Nycholas Rayner, PT Acute  Rehab Services [facility] [phone]   Benjiman VEAR [place] 02/23/2024, 2:17 PM

## 2024-02-24 DIAGNOSIS — J984 Other disorders of lung: Secondary | ICD-10-CM | POA: Diagnosis not present

## 2024-02-24 DIAGNOSIS — E871 Hypo-osmolality and hyponatremia: Secondary | ICD-10-CM | POA: Diagnosis not present

## 2024-02-24 LAB — BASIC METABOLIC PANEL WITH GFR
Anion gap: 7 (ref 5–15)
BUN: 10 mg/dL (ref 8–23)
CO2: 19 mmol/L — ABNORMAL LOW (ref 22–32)
Calcium: 8.3 mg/dL — ABNORMAL LOW (ref 8.9–10.3)
Chloride: 105 mmol/L (ref 98–111)
Creatinine, Ser: 0.73 mg/dL (ref 0.61–1.24)
GFR, Estimated: >60 mL/min (ref >60)
Glucose, Bld: 139 mg/dL — ABNORMAL HIGH (ref 70–99)
Potassium: 3.7 mmol/L (ref 3.5–5.1)
Sodium: 131 mmol/L — ABNORMAL LOW (ref 135–145)

## 2024-02-24 LAB — CBC
HCT: 24.9 % — ABNORMAL LOW (ref 39.0–52.0)
Hemoglobin: 8.3 g/dL — ABNORMAL LOW (ref 13.0–17.0)
MCH: 31.7 pg (ref 26.0–34.0)
MCHC: 33.3 g/dL (ref 30.0–36.0)
MCV: 95 fL (ref 80.0–100.0)
Platelets: 205 K/uL (ref 150–400)
RBC: 2.62 MIL/uL — ABNORMAL LOW (ref 4.22–5.81)
RDW: 15.6 % — ABNORMAL HIGH (ref 11.5–15.5)
WBC: 8.1 K/uL (ref 4.0–10.5)
nRBC: 0 % (ref 0.0–0.2)

## 2024-02-24 MED ORDER — DOCUSATE SODIUM 100 MG PO CAPS
100.0000 mg | ORAL_CAPSULE | Freq: Every day | ORAL | Status: DC
  Administered 2024-02-26: 100 mg via ORAL
  Filled 2024-02-24 (×3): qty 1

## 2024-02-24 NOTE — Progress Notes (Signed)
 [facility] Progress Note  Patient: Cody Glenn     FMW:[id number]  DOA: 02/20/2024   PCP: [name], MD       Brief hospital course: This is a 68 year old male with history of heavy alcohol use, peptic ulcer disease, partial gastrectomy, hypertension, who presents to the hospital for bilateral lower extremity edema with complaints of dyspnea, cough productive of green sputum and occasional hemoptysis. 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  Subjective:  Continues to have a mild cough. No other complaints.   Assessment and Plan: Principal Problem:   Cavitary lesion of lung with acute respiratory failure - He was initially was found to have a pulse ox of 80s on room air and required a non re breather in the ED - Pulmonary consulted for assistance with management - 7/16-underwent bronchoscopy with BAL, brushings and biopsies - Has been weaned to room air and pulse ox is at 100% - cont Zosyn  - Vancomycin  discontinued  Active Problems:   Alcohol use Cirrhosis of the liver - Drinks a gallon of beer daily - Started on phenobarbital -no signs or symptoms of withdrawal so far - Continue folate, multivitamin and thiamine  - Have discussed alcohol cessation with him  Lower extremity edema Chronic lower extremity venous thromboembolism -Vascular ultrasound reveals bilateral chronic deep venous thrombosis in the common femoral,  femoral and  popliteal veins  -Patient states that he previously had pedal edema but this appears to have resolved in the hospital - Place teds and encourage early ambulation    Tobacco use - Admits to smoking about 1-2 ppd - Smoking cessation discussed - continue Nicoderm patch    Primary hypertension - Continue valsartan     Hyponatremia - Likely secondary to beer  potomania - Sodium steadily improving     Ascending aortic aneurysm (HCC) -Noted on imaging to be 4.1 cm - Will need continued outpatient surveillance      Code Status: Full Code Total time on patient care: 35 minutes DVT prophylaxis:  enoxaparin  (LOVENOX ) injection 40 mg Start: 02/22/24 1400 SCDs Start: 02/20/24 1552     Objective:   Vitals:   02/23/24 1450 02/23/24 2302 02/24/24 0624 02/24/24 1300  BP: (!) 145/77 (!) 150/88 (!) 157/85 (!) 152/76  Pulse: 73 61 (!) 55 67  Resp: 20 16 20 17   Temp: 97.6 F (36.4 C) 98.1 F (36.7 C) 98.9 F (37.2 C) 98.7 F (37.1 C)  TempSrc: Oral Oral Oral Oral  SpO2: 100% 100% 100% 100%  Weight:      Height:       Filed Weights   02/20/24 1205 02/20/24 2134  Weight: 66 kg 69 kg   Exam: General exam: Appears comfortable  HEENT: oral mucosa moist Respiratory system: Clear to auscultation.  Cardiovascular system: S1 & S2 heard  Gastrointestinal system: Abdomen soft, non-tender, nondistended. Normal bowel sounds   Extremities: No cyanosis, clubbing or edema Psychiatry:  Mood & affect appropriate.     CBC: Recent Labs  Lab 02/20/24 1223 02/21/24 0139 02/22/24 0302 02/23/24 0338 02/24/24 0350  WBC 10.8* 8.3 9.7 7.4 8.1  NEUTROABS 8.3*  --   --   --   --   HGB 10.4* 9.7* 8.5* 7.6* 8.3*  HCT 29.8* 28.2* 25.9* 22.8* 24.9*  MCV 88.7 90.7 94.9 93.8 95.0  PLT 296 300 252  242 205   Basic Metabolic Panel: Recent Labs  Lab 02/20/24 1653 02/20/24 2200 02/22/24 0640 02/23/24 0338 02/24/24 0350  NA 120* 121* 128* 130* 131*  K 3.6 3.8 3.7 3.1* 3.7  CL 86* 87* 98 103 105  CO2 25 22 21* 20* 19*  GLUCOSE 118* 103* 86 109* 139*  BUN 6* 7* 9 8 10   CREATININE 0.47* 0.45* 0.62 0.88 0.73  CALCIUM 8.2* 8.5* 8.4* 8.3* 8.3*     Scheduled Meds:  Chlorhexidine  Gluconate Cloth  6 each Topical Daily   docusate sodium   100 mg Oral QHS   enoxaparin  (LOVENOX ) injection  40 mg Subcutaneous Q24H   feeding supplement  237 mL Oral BID BM    folic acid   1 mg Oral Daily   multivitamin with minerals  1 tablet Oral Daily   nicotine   14 mg Transdermal Daily   thiamine   100 mg Oral Daily    Imaging and lab data personally reviewed   Author: Dawson Hollman  02/24/2024 2:54 PM  To contact [facility]>   Check the care team in [facility] and look for the attending/consulting [facility] provider listed  Log into [email or web address] and use [place]'s universal password   Go to> [facility]  and find provider  If you still have difficulty reaching the provider, please page the [facility] (Director on Call) for the Hospitalists listed on amion

## 2024-02-24 NOTE — Plan of Care (Signed)

## 2024-02-24 NOTE — Plan of Care (Signed)
   Problem: Education: Goal: Knowledge of General Education information will improve Description Including pain rating scale, medication(s)/side effects and non-pharmacologic comfort measures Outcome: Progressing   Problem: Health Behavior/Discharge Planning: Goal: Ability to manage health-related needs will improve Outcome: Progressing

## 2024-02-25 DIAGNOSIS — J984 Other disorders of lung: Secondary | ICD-10-CM | POA: Diagnosis not present

## 2024-02-25 DIAGNOSIS — F17210 Nicotine dependence, cigarettes, uncomplicated: Secondary | ICD-10-CM | POA: Diagnosis not present

## 2024-02-25 DIAGNOSIS — F1020 Alcohol dependence, uncomplicated: Secondary | ICD-10-CM | POA: Diagnosis not present

## 2024-02-25 LAB — QUANTIFERON-TB GOLD PLUS: QuantiFERON-TB Gold Plus: NEGATIVE

## 2024-02-25 LAB — ASPERGILLUS ANTIBODY BY IMMUNODIFF
Aspergillus flavus: NEGATIVE
Aspergillus fumigatus, IgG: 1:4 {titer} — ABNORMAL HIGH
Aspergillus niger: NEGATIVE

## 2024-02-25 LAB — QUANTIFERON-TB GOLD PLUS (RQFGPL)
QuantiFERON Mitogen Value: 1.11 [IU]/mL
QuantiFERON Nil Value: 0.06 [IU]/mL
QuantiFERON TB1 Ag Value: 0.05 [IU]/mL
QuantiFERON TB2 Ag Value: 0.05 [IU]/mL

## 2024-02-25 MED ORDER — OXYCODONE-ACETAMINOPHEN 5-325 MG PO TABS
1.0000 | ORAL_TABLET | ORAL | Status: DC | PRN
  Administered 2024-02-26 (×3): 1 via ORAL
  Filled 2024-02-25 (×4): qty 1

## 2024-02-25 MED ORDER — AMOXICILLIN-POT CLAVULANATE 875-125 MG PO TABS
1.0000 | ORAL_TABLET | Freq: Two times a day (BID) | ORAL | Status: DC
  Administered 2024-02-25 – 2024-02-28 (×7): 1 via ORAL
  Filled 2024-02-25 (×7): qty 1

## 2024-02-25 NOTE — Progress Notes (Signed)
   NAME:  Cody Glenn, MRN:  [id number], DOB:  1956-08-07, LOS: 5 ADMISSION DATE:  02/20/2024, CONSULTATION DATE:  02/20/24 REFERRING MD:  ED, CHIEF COMPLAINT:  cavitary lung disease   History of Present Illness:  Cody Glenn is a 68 year old male with history of alcohol abuse who presented to the ED with bilateral lower extremity edema. He drinks a gallon of beer per day.    CT Chest with contrast showed emphysematous disease, a left upper lung cavitary lesion measuring 7.4 x 4.1cm with solid component concerning for malignancy or possibly aspergilloma. There is peripheral/basilar predominant honey combing concerning for pulmonary fibrosis.    He reports productive cough with green sputum with occasional blood tinged sputum. He reports loss of 7 lbs of weight. He does report some sweats.   He does smoke 3 packs per week. He has been smoking cigarettes since age 51. He drinks alcohol daily. Denies other drug use. He is currently homeless and came from a homeless shelter. He was incarcerated for a few years in the 1990s. No known TB exposure.     Pertinent  Medical History   Past Medical History:  Diagnosis Date   Alcohol use    Hyperbilirubinemia 07/01/2020   Peptic ulcer 08/09/1987   Jul 28, 2008 Entered By: KATHERINE ETHA HERO Comment: s/p partial gastrectomy   Significant Hospital Events: Including procedures, antibiotic start and stop dates in addition to other pertinent events   7/15 admitted to hospital, PCCM consulted 7/15 CT scan of the chest without any mediastinal or hilar adenopathy.  Significant emphysematous disease bilaterally.  Left upper lobe 7.4 x 4.1 cm cavitary lesion with a peripheral solid component and central opacity suggestive of a mycetoma 02/21/2024-bronchoscopy performed  Interim History / Subjective:  Feels better Still has a cough Sputum production, slightly blood-tinged  Objective    Blood pressure (!) 158/79, pulse (!) 54, temperature 98.7 F (37.1  C), temperature source Oral, resp. rate 16, height 5' 5 (1.651 m), weight 69 kg, SpO2 100%.        Intake/Output Summary (Last 24 hours) at 02/25/2024 1213 Last data filed at 02/25/2024 1000 Gross per 24 hour  Intake 745.7 ml  Output 2025 ml  Net -1279.3 ml   Filed Weights   02/20/24 1205 02/20/24 2134  Weight: 66 kg 69 kg    Examination: General: Elderly, does not appear to be in distress HENT: Moist oral mucosa Lungs: Distant breath sounds bilaterally S1-S2 appreciated Cardiovascular: S1-S2 appreciated Abdomen: Soft, bowel sounds appreciated Extremities: Mild pedal edema Neuro: Awake alert interactive GU:   I reviewed last 24 h vitals and pain scores, last 48 h intake and output, last 24 h labs and trends, and last 24 h imaging results. Resolved problem list   Assessment and Plan   Cavitary right upper lobe lesion - Concern for mycetoma S/p bronchoscopy - BAL is negative so far  Has remained on antibiotics vancomycin  and Zosyn  - Negative cultures so far - Will switch to Augmentin  for 5 days  Emphysema possible pulmonary fibrosis - Continue bronchodilator treatments   Nicotine  dependence Alcohol dependence - Counseling  Femoral and popliteal DVTs - On anticoagulation  Continue other lines of care  QuantiFERON pending Follow-up on other cultures

## 2024-02-25 NOTE — Plan of Care (Signed)
  Problem: Clinical Measurements: Goal: Ability to maintain clinical measurements within normal limits will improve Outcome: Progressing   Problem: Activity: Goal: Risk for activity intolerance will decrease Outcome: Progressing   Problem: Nutrition: Goal: Adequate nutrition will be maintained Outcome: Progressing   Problem: Pain Managment: Goal: General experience of comfort will improve and/or be controlled Outcome: Progressing

## 2024-02-26 ENCOUNTER — Inpatient Hospital Stay (HOSPITAL_COMMUNITY)

## 2024-02-26 DIAGNOSIS — J8410 Pulmonary fibrosis, unspecified: Secondary | ICD-10-CM

## 2024-02-26 DIAGNOSIS — J984 Other disorders of lung: Secondary | ICD-10-CM | POA: Diagnosis not present

## 2024-02-26 LAB — FUNGITELL BETA-D-GLUCAN: Fungitell Value:: <31.25 pg/mL

## 2024-02-26 MED ORDER — ACETAMINOPHEN 325 MG PO TABS
650.0000 mg | ORAL_TABLET | Freq: Four times a day (QID) | ORAL | Status: DC | PRN
  Administered 2024-02-27: 650 mg via ORAL
  Filled 2024-02-26: qty 2

## 2024-02-26 MED ORDER — SODIUM CHLORIDE 3 % IN NEBU
4.0000 mL | INHALATION_SOLUTION | Freq: Once | RESPIRATORY_TRACT | Status: AC
  Administered 2024-02-26: 4 mL via RESPIRATORY_TRACT
  Filled 2024-02-26: qty 4

## 2024-02-26 MED ORDER — IOHEXOL 350 MG/ML SOLN
75.0000 mL | Freq: Once | INTRAVENOUS | Status: AC | PRN
  Administered 2024-02-26: 75 mL via INTRAVENOUS

## 2024-02-26 NOTE — Progress Notes (Signed)
   NAME:  Cody Glenn, MRN:  [id number], DOB:  12-27-1955, LOS: 6 ADMISSION DATE:  02/20/2024, CONSULTATION DATE:  02/20/24 REFERRING MD:  ED, CHIEF COMPLAINT:  cavitary lung disease   History of Present Illness:  Cody Glenn is a 69 year old male with history of alcohol abuse who presented to the ED with bilateral lower extremity edema. He drinks a gallon of beer per day.    CT Chest with contrast showed emphysematous disease, a left upper lung cavitary lesion measuring 7.4 x 4.1cm with solid component concerning for malignancy or possibly aspergilloma. There is peripheral/basilar predominant honey combing concerning for pulmonary fibrosis.    He reports productive cough with green sputum with occasional blood tinged sputum. He reports loss of 7 lbs of weight. He does report some sweats.   He does smoke 3 packs per week. He has been smoking cigarettes since age 36. He drinks alcohol daily. Denies other drug use. He is currently homeless and came from a homeless shelter. He was incarcerated for a few years in the 1990s. No known TB exposure.    Pertinent  Medical History   Past Medical History:  Diagnosis Date   Alcohol use    Hyperbilirubinemia 07/01/2020   Peptic ulcer 08/09/1987   Jul 28, 2008 Entered By: KATHERINE ETHA HERO Comment: s/p partial gastrectomy   Significant Hospital Events: Including procedures, antibiotic start and stop dates in addition to other pertinent events   7/15 admitted to hospital, PCCM consulted 7/15 CT scan of the chest without any mediastinal or hilar adenopathy.  Significant emphysematous disease bilaterally.  Left upper lobe 7.4 x 4.1 cm cavitary lesion with a peripheral solid component and central opacity suggestive of a mycetoma 02/21/2024-bronchoscopy performed  Interim History / Subjective:  Overall better Cough   Objective    Blood pressure (!) 171/89, pulse (!) 56, temperature 97.9 F (36.6 C), temperature source Oral, resp. rate 18, height  5' 5 (1.651 m), weight 69 kg, SpO2 100%.        Intake/Output Summary (Last 24 hours) at 02/26/2024 1213 Last data filed at 02/26/2024 0946 Gross per 24 hour  Intake 1560 ml  Output 200 ml  Net 1360 ml   Filed Weights   02/20/24 1205 02/20/24 2134  Weight: 66 kg 69 kg    Examination: General: Elderly, does not appear to be in distress HENT: Moist oral mucosa Lungs: Decreased breath sounds bilaterally Cardiovascular: S1-S2 appreciated Abdomen: Soft, bowel sounds appreciated Extremities: Mild edema Neuro: Awake alert interactive GU:   I reviewed last 24 h vitals and pain scores, last 48 h intake and output, last 24 h labs and trends, and last 24 h imaging results.  Resolved problem list   Assessment and Plan   Cavitary right upper lobe lesion - Concern for mycetoma -Aspergillus IgG positive and blood  QuantiFERON-TB gold negative  Was switched to Augmentin  to be used for 5 days  Appreciate ID input, consulted for possible aspergilloma  He does have minimal intermittent hemoptysis No history of massive hemoptysis Does have underlying emphysema, interstitial lung disease - Not a good candidate for surgical resection without risking being a respiratory cripple  Alcohol dependence Nicotine  dependence  Femoral and popliteal DVTs - On anticoagulation  Added order for induced sputum for AFB

## 2024-02-26 NOTE — Plan of Care (Signed)
   Problem: Activity: Goal: Risk for activity intolerance will decrease Outcome: Progressing   Problem: Pain Managment: Goal: General experience of comfort will improve and/or be controlled Outcome: Progressing   Problem: Safety: Goal: Ability to remain free from injury will improve Outcome: Progressing

## 2024-02-26 NOTE — Plan of Care (Signed)

## 2024-02-26 NOTE — Consult Note (Addendum)
 [facility]    Date of Admission:  02/20/2024     Reason for Consult: cavitary lung lesion    Referring Provider: Earley     Lines:  Peripheral iv's  Abx: 7/20-c amox-clav        7/15-20 vanc/piptazo    Assessment: 68 yo male  smoker, alcohol abuse, incarceration and homelessness admitted 7/15 for several weeks of weight loss, mostly dry cough intermittent trace blood, found to have bilateral basilar fibrosis changes and right upper lobe cavity along with suggestion of fungal ball   Afbrile/no leukocytosis. Initially hypoxemic but resolved after tx for pneumonia  He has emphysematous changes and fibrosis changes. 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 Quantiferon gold is not a reliable test in this case -The aspergillus igg is nonspecific. He is not a host for aspergillus invasive infection. And voriconazole would not be beneficial. If continued to be symptomatic in terms of chest pain/worsening hemoptysis will need IR embolization vs partial lobectomy -cancer in ddx and being checked -probably would finish 3 weeks pneumonia treatment  -spine tenderness will get mri to r/o metastatic disease either from cancer/infectious process in ddx above   Plan: Recommend 3 weeks total abx (augmentin  is fine) until 03/12/24 Mri thoracic-lumbar spine without contrast Defer chronic dvt management to primary team.  F/u pathology from bal Added rifampin-mtb pcr  testing from bal 7/21 Await afb culture Even if fungal cx positive for mold or fungus I would not treat Maintain airborne isolation while in house Discussed with pulm/primary team  ------------ Addendum I have contacted [facility] to notify them of tb workup  I have also asked case manager to help make sure patient has good contacts address/phone in case [facility] need to call him      ------------------------------------------------ Principal Problem:   Cavitary lesion of lung Active Problems:   Alcohol use   Tobacco use   Primary hypertension   Prediabetes   Hyponatremia   Normocytic anemia   Mild protein malnutrition (HCC)   Acute respiratory failure with hypoxia (HCC)   Emphysema/COPD (HCC)   Ascending aortic aneurysm (HCC)   Coronary artery calcification   Aortic atherosclerosis (HCC)   Closed fracture of eight ribs of right side   DVT of lower extremity, bilateral (HCC)    HPI: Cody Glenn is a 68 y.o. male with heavy alcohol use (about 4 to 540 ounces beer daily), smoker, PUD s/p partial gastrectomy, hx incarceration/current homelessness bib EMS for dyspnea, dry cough of several weeks   Confirm hx with patient Patient didn't want to into too much on homelessness and incarceration hx No travel outside the states  [place] he check tb screen once a year and was not told to be positive previously  Several weeks dry cough/intermittent hemoptysis, worsening dyspnea,  No f/c/nightsweat  No rash Chronic left shoulder pain unable to abduct  Heavy drinking and ongoing smoking  Afebrile/no leukocytosis on admission 80s% o2 sat at presentation  needing nrb Ct chest fibrosis bases and rul cavitary lesion with central solid component Bcx negative Empiric vanc/piptazo transitioned to amox-clav 7/21  Hypoxemia resolved  Other finding include bilateral lower ext duplex and cirrhosis finding on liver u/s   Pulm following and bronch 7/16 Cytology pending Called micro and they  didn't receive the sample --> able to track it to cytology department and will run afb/fungal/bacterial cx and rif-mtb pcr    History reviewed. No pertinent family history.  Social History   Tobacco Use   Smoking status: Every Day    Current packs/day: 1.00    Average packs/day: 1 pack/day for 40.0 years (40.0 ttl pk-yrs)    Types: Cigarettes   Smokeless tobacco: Never  Vaping Use   Vaping status: Never Used  Substance Use Topics   Alcohol use: Yes    Comment: lots on 06/29/20   Drug use: Not Currently    No Known Allergies  Review of Systems: ROS All Other ROS was negative, except mentioned above   Past Medical History:  Diagnosis Date   Alcohol use    Hyperbilirubinemia 07/01/2020   Peptic ulcer 08/09/1987   Jul 28, 2008 Entered By: KATHERINE ETHA HERO Comment: s/p partial gastrectomy       Scheduled Meds:  amoxicillin -clavulanate  1 tablet Oral Q12H   Chlorhexidine  Gluconate Cloth  6 each Topical Daily   docusate sodium   100 mg Oral QHS   enoxaparin  (LOVENOX ) injection  40 mg Subcutaneous Q24H   feeding supplement  237 mL Oral BID BM   folic acid   1 mg Oral Daily   multivitamin with minerals  1 tablet Oral Daily   nicotine   14 mg Transdermal Daily   sodium chloride  HYPERTONIC  4 mL Nebulization Once   thiamine   100 mg Oral Daily   Continuous Infusions: PRN Meds:.acetaminophen , ondansetron  **OR** ondansetron  (ZOFRAN ) IV, oxyCODONE -acetaminophen    OBJECTIVE: Blood pressure (!) 146/90, pulse 66, temperature 97.9 F (36.6 C), temperature source Oral, resp. rate 15, height 5' 5 (1.651 m), weight 69 kg, SpO2 100%.  Physical Exam  General/constitutional: no distress, pleasant HEENT: Normocephalic, PER, Conj Clear, EOMI, Oropharynx clear Neck supple CV: rrr no mrg Lungs: clear to auscultation, normal respiratory effort Abd: Soft, Nontender Ext: bilateral lower ext edema Skin: No Rash Neuro: nonfocal MSK: left shoulder can't abduct beyond 30 degree; no  sign of frozen shoulder; tender lower thoracic spine     Lab Results Lab Results  Component Value Date   WBC 8.1 02/24/2024   HGB 8.3 (L) 02/24/2024   HCT 24.9 (L) 02/24/2024   MCV 95.0 02/24/2024   PLT 205 02/24/2024    Lab Results  Component Value Date   CREATININE 0.73 02/24/2024   BUN 10 02/24/2024   NA 131 (L) 02/24/2024   K 3.7 02/24/2024   CL 105 02/24/2024   CO2 19 (L) 02/24/2024    Lab Results  Component Value Date   ALT 14 02/20/2024   AST 28 02/20/2024   ALKPHOS 75 02/20/2024   BILITOT 1.8 (H) 02/20/2024      Microbiology: Recent Results (from the past 240 hours)  MRSA Next Gen by PCR, Nasal     Status: None   Collection Time: 02/20/24  5:23 PM   Specimen: Nasal Mucosa; Nasal Swab  Result Value Ref Range Status   MRSA by PCR Next Gen NOT DETECTED NOT DETECTED Final    Comment: (NOTE) The GeneXpert MRSA Assay (FDA approved for NASAL specimens only), is one component of a comprehensive  MRSA colonization surveillance program. It is not intended to diagnose MRSA infection nor to guide or monitor treatment for MRSA infections. Test performance is not FDA approved in patients less than 60 years old. Performed at [facility], [address] [place], [place] [address]      Serology:    Imaging: If present, new imagings (plain films, ct scans, and mri) have been personally visualized and interpreted; radiology reports have been reviewed. Decision making incorporated into the Impression / Recommendations.   7/14 chest ct with contrast FINDINGS: Cardiovascular: 4.1 cm ascending thoracic aortic aneurysm. Normal cardiac size. No pericardial effusion. Coronary artery calcifications are noted.   Mediastinum/Nodes: No enlarged mediastinal, hilar, or axillary lymph nodes. Thyroid  gland, trachea, and esophagus demonstrate no significant findings.   Lungs/Pleura: No pneumothorax or pleural effusion is noted. Emphysematous disease is  noted throughout both lungs. 7.4 x 4.1 cm cavitary abnormality is noted in left lung apex with peripheral solid component seen inferiorly highly concerning for neoplasm or malignancy, or possibly aspergilloma. Peripheral honeycombing is noted in both lung bases suggesting some degree of fibrosis.   Upper Abdomen: No acute abnormality.   Musculoskeletal: Nondisplaced right eighth rib fracture is noted.   IMPRESSION: 7.4 x 4.1 cm cavitary abnormality is noted in left lung apex with peripheral solid component seen inferiorly highly concerning for neoplasm or malignancy, or possibly aspergilloma.   Emphysematous disease is noted throughout both lungs with bullous seen in both upper lobes.   Peripheral honeycombing is noted throughout both lungs concerning for possible pulmonary fibrosis.   Nondisplaced right eighth rib fracture.   4.1 cm ascending thoracic aortic aneurysm. Recommend annual imaging followup by CTA or MRA. This recommendation follows 2010 ACCF/AHA/AATS/ACR/ASA/SCA/SCAI/SIR/STS/SVM Guidelines for the Diagnosis and Management of Patients with Thoracic Aortic Disease. Circulation. 2010; 121: Z733-z630. Aortic aneurysm NOS (ICD10-I71.9).   Coronary calcifications are noted suggesting coronary artery disease.   Aortic Atherosclerosis     7/15 abd u/s ruq 1. Gallbladder sludge with no acute cholecystitis. 2. Cirrhosis. No focal liver lesions identified. Please note that liver protocol enhanced MR and CT are the most sensitive tests for the screening detection of hepatocellular carcinoma in the high risk setting of cirrhosis.     7/15 lower ext vasc ultrasound RIGHT:  - Findings consistent with chronic deep vein thrombosis involving the  right common femoral vein, right femoral vein, and right popliteal vein.  - No cystic structure found in the popliteal fossa.    LEFT:  - Findings consistent with chronic deep vein thrombosis involving the left  common  femoral vein, left femoral vein, and left popliteal vein.  - No cystic structure found in the popliteal fossa.  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     7/15 tte  1. Left ventricular ejection fraction, by estimation, is 55 to 60%. The  left ventricle has normal function. The left ventricle has no regional  wall motion abnormalities. There is mild concentric left ventricular  hypertrophy. Left ventricular diastolic  parameters are indeterminate.   2. Right ventricular systolic function is normal. The right ventricular  size is normal. There is normal pulmonary artery systolic pressure.   3. The mitral valve is normal in structure. No evidence of mitral valve  regurgitation. No evidence of mitral stenosis.   4. Tricuspid valve regurgitation is mild to moderate.   5. The aortic valve is normal in structure. Aortic valve regurgitation is  not visualized. No aortic stenosis is present.  6. The inferior vena cava is normal in size with greater than 50%  respiratory variability, suggesting right atrial pressure of 3 mmHg.    Constance ONEIDA Passer, MD [facility] [phone] pager    02/26/2024, 1:56 PM

## 2024-02-26 NOTE — Progress Notes (Signed)
 [facility] Progress Note  Patient: Cody Glenn     FMW:[id number]  DOA: 02/20/2024   PCP: [name], MD       Brief hospital course: This is a 68 year old male with history of heavy alcohol use, peptic ulcer disease, partial gastrectomy, hypertension, who presents to the hospital for bilateral lower extremity edema with complaints of dyspnea, cough productive of green sputum and occasional hemoptysis. 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  Subjective:  No new complaints.   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-poor candidate for surgical resection and voriconazole  Active Problems:   Alcohol use  Cirrhosis of the liver - Drinks a gallon of beer daily - Continue folate, multivitamin and thiamine  - Have discussed alcohol cessation with him  Lower extremity edema Chronic lower extremity venous thromboembolism -Vascular ultrasound reveals bilateral chronic deep venous thrombosis in the common femoral,  femoral and  popliteal veins  -Patient states that he previously had pedal edema but this appears to have resolved in the hospital -Continue Lovenox  for DVT prophylaxis    Tobacco use - Admits to smoking about 1-2 ppd - Smoking cessation discussed - continue Nicoderm patch    Primary hypertension - Continue valsartan     Hyponatremia - Likely  secondary to beer potomania - Improving    Ascending aortic aneurysm (HCC) -Noted on imaging to be 4.1 cm - Will need continued outpatient surveillance      Code Status: Full Code Total time on patient care: 35 minutes DVT prophylaxis:  enoxaparin  (LOVENOX ) injection 40 mg Start: 02/22/24 1400 SCDs Start: 02/20/24 1552     Objective:   Vitals:   02/25/24 2000 02/25/24 2248 02/26/24 0452 02/26/24 1319  BP:  (!) 157/90 (!) 171/89 (!) 146/90  Pulse: (!) 58 (!) 53 (!) 56 66  Resp:  16 18 15   Temp:  98.2 F (36.8 C) 97.9 F (36.6 C)   TempSrc:  Oral Oral   SpO2:  100% 100% 100%  Weight:      Height:       Filed Weights   02/20/24 1205 02/20/24 2134  Weight: 66 kg 69 kg   Exam: General exam: Appears comfortable  HEENT: oral mucosa moist Respiratory system: Clear to auscultation.  Cardiovascular system: S1 & S2 heard  Gastrointestinal system: Abdomen soft, non-tender, nondistended. Normal bowel sounds   Extremities: No cyanosis, clubbing or edema Psychiatry:  Mood & affect appropriate.     CBC: Recent Labs  Lab 02/20/24 1223 02/21/24 0139 02/22/24 0302 02/23/24 0338 02/24/24 0350  WBC 10.8* 8.3 9.7 7.4 8.1  NEUTROABS 8.3*  --   --   --   --   HGB 10.4* 9.7* 8.5* 7.6* 8.3*  HCT 29.8* 28.2* 25.9* 22.8* 24.9*  MCV 88.7 90.7 94.9 93.8 95.0  PLT 296 300 252 242 205   Basic Metabolic Panel: Recent Labs  Lab 02/20/24 1653 02/20/24 2200 02/22/24 0640 02/23/24 0338 02/24/24 0350  NA 120* 121* 128* 130* 131*  K 3.6 3.8 3.7 3.1* 3.7  CL 86* 87* 98 103 105  CO2 25 22 21* 20* 19*  GLUCOSE 118* 103* 86 109* 139*  BUN 6* 7* 9 8 10   CREATININE 0.47* 0.45* 0.62 0.88 0.73  CALCIUM 8.2* 8.5* 8.4* 8.3* 8.3*     Scheduled Meds:  amoxicillin -clavulanate  1 tablet Oral Q12H   Chlorhexidine  Gluconate Cloth  6 each Topical Daily   docusate sodium   100 mg Oral QHS   enoxaparin  (LOVENOX ) injection  40 mg Subcutaneous Q24H   feeding supplement  237 mL Oral BID BM    folic acid   1 mg Oral Daily   multivitamin with minerals  1 tablet Oral Daily   nicotine   14 mg Transdermal Daily   sodium chloride  HYPERTONIC  4 mL Nebulization Once   thiamine   100 mg Oral Daily    Imaging and lab data personally reviewed   Author: [employer]  02/26/2024 1:33 PM  To contact [facility]>   Check the care team in [facility] and look for the attending/consulting [facility] provider listed  Log into [email or web address] and use [place]'s universal password   Go to> [facility]  and find provider  If you still have difficulty reaching the provider, please page the [facility] (Director on Call) for the Hospitalists listed on amion

## 2024-02-26 NOTE — Plan of Care (Signed)
 ?  Problem: Coping: ?Goal: Level of anxiety will decrease ?Outcome: Progressing ?  ?Problem: Safety: ?Goal: Ability to remain free from injury will improve ?Outcome: Progressing ?  ?

## 2024-02-26 NOTE — Progress Notes (Signed)
 Physical Therapy Treatment Patient Details Name: Cody Glenn MRN: [id number] DOB: 22-Nov-1955 Today's Date: 02/26/2024   History of Present Illness Pt is 68 yo male admitted 02/20/24 with cavitary R upper lobe lesion (concern for mycetoma).  Pt s/p bronchoscopy on 02/21/24.  Pt with femoral and popliteal DVTs - on anticoagulation.  Also with emphysema and alcohol dependence with CIWA protocol.  Pt with hx including but not limited to EtOH abuse, peptic ulcer, hyperbilirubinemia    PT Comments  Pt agreeable to PT. Gait/activity tolerance improved this session. Reviewed and discussed features of pt's rollator which shows significant wear on wheels causing brakes not to grip. This makes rollator unsafe to sit on without assist as well as potentially unsafe on uneven surfaces/ramps. Pt would benefit from new rollator--will defer to TOC.      If plan is discharge home, recommend the following: Help with stairs or ramp for entrance   Can travel by private vehicle        Equipment Recommendations  Other (comment) (needs/requests new rollator)    Recommendations for Other Services       Precautions / Restrictions Precautions Precautions: Fall Restrictions Weight Bearing Restrictions Per Provider Order: No     Mobility  Bed Mobility               General bed mobility comments: seated on EOB and returned to same    Transfers Overall transfer level: Needs assistance Equipment used: Rollator (4 wheels) Transfers: Sit to/from Stand Sit to Stand: Contact guard assist, Supervision           General transfer comment: STS x3. reviewed use of rollator however after practice discussed limited safety with sitting on rollator d/t wheel and brake wear    Ambulation/Gait Ambulation/Gait assistance: Supervision, Contact guard assist Gait Distance (Feet): 200 Feet Assistive device: Rollator (4 wheels) Gait Pattern/deviations: Step-through pattern, Decreased stride length Gait  velocity: decreased but functional;     General Gait Details: intermittent cues for walker position and safety with turns   Stairs             Wheelchair Mobility     Tilt Bed    Modified Rankin (Stroke Patients Only)       Balance   Sitting-balance support: No upper extremity supported Sitting balance-Leahy Scale: Good     Standing balance support: Bilateral upper extremity supported, Reliant on assistive device for balance Standing balance-Leahy Scale: Fair Standing balance comment: able to static stand briefly without UE support                            Communication Communication Communication: No apparent difficulties  Cognition Arousal: Alert Behavior During Therapy: WFL for tasks assessed/performed   PT - Cognitive impairments: No family/caregiver present to determine baseline                       PT - Cognition Comments: Overall orientation intact and able to follow commands.requests assist in making up bed. cannot recall if he ordered supper Following commands: Intact      Cueing Cueing Techniques: Verbal cues  Exercises      General Comments        Pertinent Vitals/Pain Pain Assessment Pain Assessment: Faces Faces Pain Scale: Hurts a little bit Pain Location: Reports chronic - neck and between shoulders posterior Pain Descriptors / Indicators: Aching Pain Intervention(s): Limited activity within patient's tolerance, Monitored during session, Repositioned  Home Living                          Prior Function            PT Goals (current goals can now be found in the care plan section) Acute Rehab PT Goals Patient Stated Goal: not stated PT Goal Formulation: With patient Time For Goal Achievement: 03/08/24 Potential to Achieve Goals: Good Progress towards PT goals: Progressing toward goals    Frequency    Min 2X/week      PT Plan      Co-evaluation              AM-PAC PT 6  Clicks Mobility   Outcome Measure  Help needed turning from your back to your side while in a flat bed without using bedrails?: None Help needed moving from lying on your back to sitting on the side of a flat bed without using bedrails?: None Help needed moving to and from a bed to a chair (including a wheelchair)?: A Little Help needed standing up from a chair using your arms (e.g., wheelchair or bedside chair)?: A Little Help needed to walk in hospital room?: A Little Help needed climbing 3-5 steps with a railing? : A Little 6 Click Score: 20    End of Session   Activity Tolerance: Patient tolerated treatment well Patient left: in bed;with call bell/phone within reach Nurse Communication: Mobility status;Other (comment) (has not ordered supper per dietary) PT Visit Diagnosis: Other abnormalities of gait and mobility (R26.89);Muscle weakness (generalized) (M62.81)     Time: 1410-1436 PT Time Calculation (min) (ACUTE ONLY): 26 min  Charges:    $Gait Training: 23-37 mins PT General Charges $$ ACUTE PT VISIT: 1 Visit                     Kathleena Freeman, PT  Acute Rehab Dept ([facility]) [phone]  02/26/2024    [facility] 02/26/2024, 2:45 PM

## 2024-02-27 ENCOUNTER — Inpatient Hospital Stay (HOSPITAL_COMMUNITY)

## 2024-02-27 DIAGNOSIS — M4804 Spinal stenosis, thoracic region: Secondary | ICD-10-CM | POA: Diagnosis not present

## 2024-02-27 DIAGNOSIS — E871 Hypo-osmolality and hyponatremia: Secondary | ICD-10-CM | POA: Diagnosis not present

## 2024-02-27 DIAGNOSIS — M47814 Spondylosis without myelopathy or radiculopathy, thoracic region: Secondary | ICD-10-CM | POA: Diagnosis not present

## 2024-02-27 DIAGNOSIS — M47816 Spondylosis without myelopathy or radiculopathy, lumbar region: Secondary | ICD-10-CM | POA: Diagnosis not present

## 2024-02-27 DIAGNOSIS — M47817 Spondylosis without myelopathy or radiculopathy, lumbosacral region: Secondary | ICD-10-CM | POA: Diagnosis not present

## 2024-02-27 DIAGNOSIS — J984 Other disorders of lung: Secondary | ICD-10-CM | POA: Diagnosis not present

## 2024-02-27 LAB — BASIC METABOLIC PANEL WITH GFR
Anion gap: 6 (ref 5–15)
BUN: 11 mg/dL (ref 8–23)
CO2: 22 mmol/L (ref 22–32)
Calcium: 7.9 mg/dL — ABNORMAL LOW (ref 8.9–10.3)
Chloride: 100 mmol/L (ref 98–111)
Creatinine, Ser: 0.67 mg/dL (ref 0.61–1.24)
GFR, Estimated: >60 mL/min (ref >60)
Glucose, Bld: 91 mg/dL (ref 70–99)
Potassium: 3.2 mmol/L — ABNORMAL LOW (ref 3.5–5.1)
Sodium: 128 mmol/L — ABNORMAL LOW (ref 135–145)

## 2024-02-27 NOTE — Plan of Care (Signed)

## 2024-02-27 NOTE — Plan of Care (Signed)
 Id brief note  Chest cta no pe Pending thoracic/lumbar spine mri Bal afb cx and rif/mtb pcr in progress  I had discussed with TB department [facility] 7/22 about need for f/u culture on this patient   Will revisit once mri available

## 2024-02-27 NOTE — Progress Notes (Signed)
 CCM MD review  Will sign off Call back If needed     SIGNATURE    Dr. Dorethia Cave, M.D., F.C.C.P,  Pulmonary and Critical Care Medicine Staff Physician, [facility] Center Director - Interstitial Lung Disease  Program  Pulmonary Fibrosis [facility] Network at [facility] [place], [place], [address]   Pager: [phone], If no answer  -> Check AMION or Try [phone] Telephone (clinical office): [phone] Telephone (research): [phone]  8:55 AM 02/27/2024

## 2024-02-27 NOTE — Plan of Care (Signed)
   Problem: Activity: Goal: Risk for activity intolerance will decrease Outcome: Progressing   Problem: Pain Managment: Goal: General experience of comfort will improve and/or be controlled Outcome: Progressing   Problem: Safety: Goal: Ability to remain free from injury will improve Outcome: Progressing

## 2024-02-27 NOTE — Progress Notes (Signed)
 [facility] Progress Note  Patient: Cody Glenn     FMW:[id number]  DOA: 02/20/2024   PCP: [name], MD       Brief hospital course: This is a 68 year old male with history of heavy alcohol use, peptic ulcer disease, partial gastrectomy, hypertension, who presents to the hospital for bilateral lower extremity edema with complaints of dyspnea, cough productive of green sputum and occasional hemoptysis. 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  Subjective:  No new complaints.   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-poor candidate for surgical resection per pulmonary team -maintain airborne isolation and wait for AFB results - MRI of thoracic and lumbar spine ordered by ID also pending - ID recommending 3 weeks of treatment for pneumonia-currently on Augmentin     Active Problems:   Alcohol use  Cirrhosis of the liver - Drinks a gallon of beer daily - Continue folate, multivitamin and thiamine  - Have discussed alcohol cessation with him - Patient did not have any alcohol withdrawal during the hospital stay  Lower extremity edema Chronic lower extremity venous thromboembolism -Vascular ultrasound reveals bilateral chronic deep venous thrombosis in the common femoral,  femoral and  popliteal veins  -No acute PE -Continue Lovenox  for DVT prophylaxis and  TED hose -He has a rollater at the bedside with him    Tobacco use - Admits to smoking about 1-2 ppd - Smoking cessation discussed - continue Nicoderm patch    Primary hypertension - Continue valsartan     Hyponatremia - Likely secondary to beer potomania - Improving    Ascending aortic aneurysm (HCC) -Noted on imaging to be 4.1 cm - Will need continued outpatient surveillance  Homeless - TOC consulted       Code Status: Full Code Total time on patient care: 35 minutes DVT prophylaxis:  enoxaparin  (LOVENOX ) injection 40 mg Start: 02/22/24 1400 SCDs Start: 02/20/24 1552     Objective:   Vitals:   02/26/24 1406 02/26/24 2200 02/27/24 0647 02/27/24 1337  BP:  (!) 146/90 (!) 141/89 132/74  Pulse:  66 71 65  Resp:  16 18 16   Temp:  98 F (36.7 C) 98 F (36.7 C) 98.5 F (36.9 C)  TempSrc:  Oral Oral Oral  SpO2: 100% 100% 100% 100%  Weight:      Height:       Filed Weights   02/20/24 1205 02/20/24 2134  Weight: 66 kg 69 kg   Exam: General exam: Appears comfortable  HEENT: oral mucosa moist Respiratory system: Clear to auscultation.  Cardiovascular system: S1 & S2 heard  Gastrointestinal system: Abdomen soft, non-tender, nondistended. Normal bowel sounds   Extremities: No cyanosis, clubbing or edema Psychiatry:  Mood & affect appropriate.     CBC: Recent Labs  Lab 02/21/24 0139 02/22/24 0302 02/23/24 0338 02/24/24 0350  WBC 8.3 9.7 7.4 8.1  HGB 9.7*  8.5* 7.6* 8.3*  HCT 28.2* 25.9* 22.8* 24.9*  MCV 90.7 94.9 93.8 95.0  PLT 300 252 242 205   Basic Metabolic Panel: Recent Labs  Lab 02/20/24 2200 02/22/24 0640 02/23/24 0338 02/24/24 0350 02/27/24 0346  NA 121* 128* 130* 131* 128*  K 3.8 3.7 3.1* 3.7 3.2*  CL 87* 98 103 105 100  CO2 22 21* 20* 19* 22  GLUCOSE 103* 86 109* 139* 91  BUN 7* 9 8 10 11   CREATININE 0.45* 0.62 0.88 0.73 0.67  CALCIUM 8.5* 8.4* 8.3* 8.3* 7.9*     Scheduled Meds:  amoxicillin -clavulanate  1 tablet Oral Q12H    Chlorhexidine  Gluconate Cloth  6 each Topical Daily   docusate sodium   100 mg Oral QHS   enoxaparin  (LOVENOX ) injection  40 mg Subcutaneous Q24H   feeding supplement  237 mL Oral BID BM   folic acid   1 mg Oral Daily   multivitamin with minerals  1 tablet Oral Daily   nicotine   14 mg Transdermal Daily   thiamine   100 mg Oral Daily    Imaging and lab data personally reviewed   Author: [employer]  02/27/2024 1:56 PM  To contact [facility]>   Check the care team in [facility] and look for the attending/consulting [facility] provider listed  Log into [email or web address] and use [place]'s universal password   Go to> [facility]  and find provider  If you still have difficulty reaching the provider, please page the [facility] (Director on Call) for the Hospitalists listed on amion

## 2024-02-27 NOTE — TOC Progression Note (Signed)
 Transition of Care [facility]) - Progression Note    Patient Details  Name: Cody Glenn MRN: [id number] Date of Birth: 07-Sep-1955  Transition of [facility]) CM/SW Contact  Alfonse JONELLE Rex, RN Phone Number: 02/27/2024, 1:37 PM  Clinical Narrative:  [facility] consult for Medication Assistance, patient has private insurance on file, not eligible for [facility] program.      Expected Discharge Plan: Homeless Shelter Barriers to Discharge: Continued Medical Work up  Expected Discharge Plan and Services In-house Referral: Clinical Social Work Discharge Planning Services: CM Consult Post Acute Care Choice: NA Living arrangements for the past 2 months: Homeless                 DME Arranged: N/A DME Agency: NA       HH Arranged: NA HH Agency: NA         Social Determinants of Health (SDOH) Interventions SDOH Screenings   Food Insecurity: Food Insecurity Present (02/21/2024)  Housing: High Risk (02/21/2024)  Transportation Needs: No Transportation Needs (02/21/2024)  Utilities: Not At Risk (02/21/2024)  Depression (PHQ2-9): Low Risk  (10/13/2020)  Social Connections: Moderately Integrated (02/21/2024)  Tobacco Use: High Risk (02/21/2024)    Readmission Risk Interventions    02/22/2024    2:07 PM  Readmission Risk Prevention Plan  Post Dischage Appt Complete  Medication Screening Complete  Transportation Screening Complete

## 2024-02-28 ENCOUNTER — Other Ambulatory Visit (HOSPITAL_COMMUNITY): Payer: Self-pay

## 2024-02-28 ENCOUNTER — Telehealth (HOSPITAL_COMMUNITY): Payer: Self-pay | Admitting: Pharmacy Technician

## 2024-02-28 DIAGNOSIS — J984 Other disorders of lung: Secondary | ICD-10-CM | POA: Diagnosis not present

## 2024-02-28 LAB — ACID FAST SMEAR (AFB, MYCOBACTERIA): Acid Fast Smear: NEGATIVE

## 2024-02-28 LAB — CULTURE, BAL-QUANTITATIVE W GRAM STAIN
Culture: NO GROWTH
Gram Stain: NONE SEEN

## 2024-02-28 LAB — CYTOLOGY - NON PAP

## 2024-02-28 MED ORDER — THIAMINE MONONITRATE 100 MG PO TABS
100.0000 mg | ORAL_TABLET | Freq: Every day | ORAL | 0 refills | Status: DC
  Filled 2024-02-28: qty 90, 90d supply, fill #0

## 2024-02-28 MED ORDER — FOLIC ACID 1 MG PO TABS
1.0000 mg | ORAL_TABLET | Freq: Every day | ORAL | 0 refills | Status: DC
  Filled 2024-02-28: qty 90, 90d supply, fill #0

## 2024-02-28 MED ORDER — AMOXICILLIN-POT CLAVULANATE 875-125 MG PO TABS
1.0000 | ORAL_TABLET | Freq: Two times a day (BID) | ORAL | 0 refills | Status: AC
  Filled 2024-02-28: qty 36, 18d supply, fill #0

## 2024-02-28 NOTE — Discharge Summary (Signed)
 Physician Discharge Summary  Cody Glenn FMW:[id number] DOB: Oct 19, 1955 DOA: 02/20/2024  PCP: [name], MD  Admit date: 02/20/2024 Discharge date: 02/28/2024  Admitted From: Home Disposition: Home  Recommendations for Outpatient Follow-up:  Follow up with PCP in 1-2 weeks Follow-up with ID as scheduled  Home Health: None Equipment/Devices: None  Discharge Condition: Stable CODE STATUS: Full Diet recommendation: Low-salt low-fat diet  Brief/Interim Summary: This is a 68 year old male with history of heavy alcohol use, peptic ulcer disease, partial gastrectomy, hypertension, who presents to the hospital for bilateral lower extremity edema with complaints of dyspnea, cough productive of green sputum and occasional hemoptysis. 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  Patient evaluated as above with concern over cavitary pneumonia, cultures/quantitative urine gold negative for TB, further discussion with infectious disease and given negative findings we will discharge patient on Augmentin  to complete 3-week course with outpatient follow-up with [facility].  Patient otherwise stable and agreeable for discharge home, medication changes as outlined below.  Discharge Diagnoses:  Principal Problem:   Cavitary lesion of lung Active Problems:   Alcohol use   Tobacco use   Primary hypertension   Prediabetes   Hyponatremia   Normocytic anemia   Mild protein malnutrition (HCC)   Acute respiratory failure with hypoxia (HCC)   Emphysema/COPD (HCC)   Ascending aortic aneurysm (HCC)   Coronary artery calcification   Aortic atherosclerosis (HCC)   Closed fracture of eight ribs of right side   DVT of lower extremity, bilateral (HCC)  Cavitary lesion of lung with acute respiratory failure - He was initially  was found to have a pulse ox of 80s on room air and required a nonrebreather in the ED -currently on room air - Pulmonary consulted for assistance with management - 7/16-underwent bronchoscopy with BAL, brushings and biopsies - QuantiFERON gold negative - Pulmonary and ID assisting with management - 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-poor candidate for surgical resection per pulmonary team  Alcohol abuse  Cirrhosis of the liver - Drinks a gallon of beer daily - discussed cessation - Continue folate, multivitamin and thiamine  - Patient did not have any alcohol withdrawal during the hospital stay   Lower extremity edema Chronic lower extremity venous thromboembolism -Vascular ultrasound reveals bilateral chronic deep venous thrombosis in the common femoral,  femoral and  popliteal veins  -No acute PE -Uses Rolator at baseline   Tobacco use - Admits to smoking about 1-2 ppd - Smoking cessation discussed - continue Nicoderm patch   Primary hypertension - Continue valsartan    Hyponatremia - Likely secondary to beer potomania - Improving   Ascending aortic aneurysm (HCC) - Noted on imaging to be 4.1 cm -  Will need continued outpatient surveillance   Homeless - TOC consulted   Incidentally noted spinal stenosis, severe MRI: L4-5: Large disc bulge and moderate facet arthrosis with severe spinal canal stenosis and impingement of the cauda equina, as well as moderate bilateral foraminal stenosis.  Discharge Instructions  Discharge Instructions     Ambulatory Referral for Lung Cancer Scre   Complete by: As directed    Call MD for:  difficulty breathing, headache or visual disturbances   Complete by: As directed    Call MD for:  extreme fatigue   Complete by: As directed    Call MD for:  hives  Complete by: As directed    Call MD for:  persistant dizziness or light-headedness   Complete by: As directed    Call MD for:  persistant nausea and vomiting   Complete by: As  directed    Call MD for:  severe uncontrolled pain   Complete by: As directed    Call MD for:  temperature >100.4   Complete by: As directed    Diet - low sodium heart healthy   Complete by: As directed    Increase activity slowly   Complete by: As directed    No wound care   Complete by: As directed       Allergies as of 02/28/2024   No Known Allergies      Medication List     STOP taking these medications    Ibuprofen 200 MG Caps   meloxicam  15 MG tablet Commonly known as: MOBIC    thiamine  100 MG tablet Commonly known as: VITAMIN B1 Replaced by: thiamine  100 MG tablet   valsartan -hydrochlorothiazide  80-12.5 MG tablet Commonly known as: Diovan  HCT       TAKE these medications    amoxicillin -clavulanate 875-125 MG tablet Commonly known as: AUGMENTIN  Take 1 tablet by mouth every 12 (twelve) hours for 18 days.   folic acid  1 MG tablet Commonly known as: FOLVITE  Take 1 tablet (1 mg total) by mouth daily.   thiamine  100 MG tablet Commonly known as: VITAMIN B1 Take 1 tablet (100 mg total) by mouth daily. Replaces: thiamine  100 MG tablet        No Known Allergies  Consultations: ID   Procedures/Studies: MR LUMBAR SPINE WO CONTRAST Result Date: 02/27/2024 EXAM: MRI THORACIC AND LUMBAR SPINE WITHOUT INTRAVENOUS CONTRAST 02/27/2024 04:19:10 PM TECHNIQUE: Multiplanar multisequence MRI of the thoracic and lumbar spine was performed without the administration of intravenous contrast. COMPARISON: None available. CLINICAL HISTORY: Concern for metastatic disease from cancer vs infection. On TB r/o isolation. FINDINGS: BONES AND ALIGNMENT: Heterogeneous bone marrow signal without focal osseous lesion. No abnormal enhancement. SPINAL CORD: Normal spinal cord size. Normal spinal cord signal. SOFT TISSUES: Small pleural effusions. THORACIC DISC LEVELS: No thoracic spinal canal or neural foraminal stenosis. LUMBAR DISC LEVELS: There is transitional lumbosacral anatomy  with the disc space at S1-2. L1-L2: Mild facet arthrosis and small disc bulge without stenosis. L2-L3: Mild facet arthrosis and small disc bulge without stenosis. L3-L4: Small disc bulge and mild left facet arthrosis with mild left foraminal stenosis. L4-L5: Large disc bulge and moderate facet arthrosis with severe spinal canal stenosis and impingement of the cauda equina with moderate bilateral foraminal stenosis. L5-S1: Intermediate sized disc bulge and mild facet arthrosis with severe spinal canal stenosis and impingement of the cauda equina. Mild bilateral foraminal stenosis. S1-S2: No disc herniation or stenosis. IMPRESSION: 1. L4-5: Large disc bulge and moderate facet arthrosis with severe spinal canal stenosis and impingement of the cauda equina, as well as moderate bilateral foraminal stenosis. 2. L5-S1: Intermediate sized disc bulge and mild facet arthrosis with severe spinal canal stenosis and impingement of the cauda equina, as well as mild bilateral foraminal stenosis. 3. Heterogeneous bone marrow signal without focal osseous lesion. No intravenous contrast was administered which lowers the sensitivity for detecting metastases. Electronically signed by: Franky Stanford MD 02/27/2024 08:48 PM EDT RP Workstation: [id number]   MR THORACIC SPINE WO CONTRAST Result Date: 02/27/2024 EXAM: MRI THORACIC AND LUMBAR SPINE WITHOUT INTRAVENOUS CONTRAST 02/27/2024 04:19:10 PM TECHNIQUE: Multiplanar multisequence MRI of the thoracic and lumbar spine was performed  without the administration of intravenous contrast. COMPARISON: None available. CLINICAL HISTORY: Concern for metastatic disease from cancer vs infection. On TB r/o isolation. FINDINGS: BONES AND ALIGNMENT: Heterogeneous bone marrow signal without focal osseous lesion. No abnormal enhancement. SPINAL CORD: Normal spinal cord size. Normal spinal cord signal. SOFT TISSUES: Small pleural effusions. THORACIC DISC LEVELS: No thoracic spinal canal or neural  foraminal stenosis. LUMBAR DISC LEVELS: There is transitional lumbosacral anatomy with the disc space at S1-2. L1-L2: Mild facet arthrosis and small disc bulge without stenosis. L2-L3: Mild facet arthrosis and small disc bulge without stenosis. L3-L4: Small disc bulge and mild left facet arthrosis with mild left foraminal stenosis. L4-L5: Large disc bulge and moderate facet arthrosis with severe spinal canal stenosis and impingement of the cauda equina with moderate bilateral foraminal stenosis. L5-S1: Intermediate sized disc bulge and mild facet arthrosis with severe spinal canal stenosis and impingement of the cauda equina. Mild bilateral foraminal stenosis. S1-S2: No disc herniation or stenosis. IMPRESSION: 1. L4-5: Large disc bulge and moderate facet arthrosis with severe spinal canal stenosis and impingement of the cauda equina, as well as moderate bilateral foraminal stenosis. 2. L5-S1: Intermediate sized disc bulge and mild facet arthrosis with severe spinal canal stenosis and impingement of the cauda equina, as well as mild bilateral foraminal stenosis. 3. Heterogeneous bone marrow signal without focal osseous lesion. No intravenous contrast was administered which lowers the sensitivity for detecting metastases. Electronically signed by: Franky Stanford MD 02/27/2024 08:48 PM EDT RP Workstation: [id number]   CT Angio Chest Pulmonary Embolism (PE) W or WO Contrast Result Date: 02/26/2024 CLINICAL DATA:  WL-CTPulmonary embolism (PE) suspected, low to intermediate prob, neg D-dimer is EXAM: CT ANGIOGRAPHY CHEST WITH CONTRAST TECHNIQUE: Multidetector CT imaging of the chest was performed using the standard protocol during bolus administration of intravenous contrast. Multiplanar CT image reconstructions and MIPs were obtained to evaluate the vascular anatomy. RADIATION DOSE REDUCTION: This exam was performed according to the departmental dose-optimization program which includes automated exposure control,  adjustment of the mA and/or kV according to patient size and/or use of iterative reconstruction technique. CONTRAST:  75mL OMNIPAQUE  IOHEXOL  350 MG/ML SOLN COMPARISON:  None Available. FINDINGS: Cardiovascular: No filling defects within the pulmonary arteries to suggest acute pulmonary embolism. Mediastinum/Nodes: No axillary or supraclavicular adenopathy. No mediastinal or hilar adenopathy. No pericardial fluid. Esophagus normal. Lungs/Pleura: Again demonstrated cavitary process in the LEFT upper lobe with rounded potential mycetoma along the inferior margin. Rounded mass lesion measures 2.0 cm unchanged. The cavitary portion at the apex measures 5.6 cm unchanged. There is small LEFT effusion increased from prior. Upper Abdomen: Limited view of the liver, kidneys, pancreas are unremarkable. Normal adrenal glands. Musculoskeletal: Nondisplaced rib fracture involving the LEFT lateral eighth rib on image 119/series 6. Review of the MIP images confirms the above findings. IMPRESSION: 1. No change in cavitary process in the LEFT upper lobe with potential mycetoma. 2. New small LEFT effusion. 3. Nondisplaced anterior lateral LEFT eighth fracture. 4. No evidence acute pulmonary embolism. Electronically Signed   By: Jackquline Boxer M.D.   On: 02/26/2024 18:58   DG Chest Port 1 View Result Date: 02/21/2024 CLINICAL DATA:  Status post bronchoscopy with biopsy. EXAM: PORTABLE CHEST 1 VIEW COMPARISON:  February 20, 2024 FINDINGS: The heart size and mediastinal contours are within normal limits. There is mild left apical pleural thickening. A predominantly stable ill-defined left upper lobe opacity is seen. Very mild hazy areas of scarring, atelectasis and/or infiltrate are seen along the periphery of the  left lung base. No pleural effusion or pneumothorax is identified. The visualized skeletal structures are unremarkable. IMPRESSION: 1. Predominantly stable ill-defined left upper lobe opacity which likely corresponds to the  biopsied left upper lobe lesion seen on prior chest CT (February 20, 2024). 2. No pneumothorax status post bronchoscopy with biopsy. Electronically Signed   By: Suzen Dials M.D.   On: 02/21/2024 15:33   DG C-ARM BRONCHOSCOPY Result Date: 02/21/2024 C-ARM BRONCHOSCOPY: Fluoroscopy was utilized by the requesting physician.  No radiographic interpretation.   ECHOCARDIOGRAM COMPLETE Result Date: 02/21/2024    ECHOCARDIOGRAM REPORT   Patient Name:   Cody Glenn Date of Exam: 02/21/2024 Medical Rec #:  [id number]         Height:       65.0 in Accession #:    [id number]        Weight:       152.1 lb Date of Birth:  05-28-1956          BSA:          1.761 m Patient Age:    68 years          BP:           136/68 mmHg Patient Gender: M                 HR:           86 bpm. Exam Location:  Inpatient Procedure: 2D Echo, Cardiac Doppler and Color Doppler (Both Spectral and Color            Flow Doppler were utilized during procedure). Indications:    Dyspnea  History:        Patient has no prior history of Echocardiogram examinations.  Sonographer:    Benard Stallion Referring Phys: [id number] DORN NOVAK DEWALD IMPRESSIONS  1. Left ventricular ejection fraction, by estimation, is 55 to 60%. The left ventricle has normal function. The left ventricle has no regional wall motion abnormalities. There is mild concentric left ventricular hypertrophy. Left ventricular diastolic parameters are indeterminate.  2. Right ventricular systolic function is normal. The right ventricular size is normal. There is normal pulmonary artery systolic pressure.  3. The mitral valve is normal in structure. No evidence of mitral valve regurgitation. No evidence of mitral stenosis.  4. Tricuspid valve regurgitation is mild to moderate.  5. The aortic valve is normal in structure. Aortic valve regurgitation is not visualized. No aortic stenosis is present.  6. The inferior vena cava is normal in size with greater than 50% respiratory  variability, suggesting right atrial pressure of 3 mmHg. FINDINGS  Left Ventricle: Left ventricular ejection fraction, by estimation, is 55 to 60%. The left ventricle has normal function. The left ventricle has no regional wall motion abnormalities. The left ventricular internal cavity size was normal in size. There is  mild concentric left ventricular hypertrophy. Left ventricular diastolic parameters are indeterminate. Right Ventricle: The right ventricular size is normal. No increase in right ventricular wall thickness. Right ventricular systolic function is normal. There is normal pulmonary artery systolic pressure. The tricuspid regurgitant velocity is 2.44 m/s, and  with an assumed right atrial pressure of 3 mmHg, the estimated right ventricular systolic pressure is 26.8 mmHg. Left Atrium: Left atrial size was normal in size. Right Atrium: Right atrial size was normal in size. Pericardium: There is no evidence of pericardial effusion. Mitral Valve: The mitral valve is normal in structure. No evidence of mitral valve regurgitation. No evidence of mitral valve  stenosis. Tricuspid Valve: The tricuspid valve is normal in structure. Tricuspid valve regurgitation is mild to moderate. No evidence of tricuspid stenosis. Aortic Valve: The aortic valve is normal in structure. Aortic valve regurgitation is not visualized. No aortic stenosis is present. Aortic valve mean gradient measures 2.0 mmHg. Aortic valve peak gradient measures 3.6 mmHg. Aortic valve area, by VTI measures 2.68 cm. Pulmonic Valve: The pulmonic valve was normal in structure. Pulmonic valve regurgitation is not visualized. No evidence of pulmonic stenosis. Aorta: The aortic root is normal in size and structure. Venous: The inferior vena cava is normal in size with greater than 50% respiratory variability, suggesting right atrial pressure of 3 mmHg. IAS/Shunts: No atrial level shunt detected by color flow Doppler.  LEFT VENTRICLE PLAX 2D LVIDd:          4.40 cm   Diastology LVIDs:         3.10 cm   LV e' medial:    10.60 cm/s LV PW:         1.00 cm   LV E/e' medial:  4.8 LV IVS:        1.00 cm   LV e' lateral:   11.50 cm/s LVOT diam:     2.10 cm   LV E/e' lateral: 4.4 LV SV:         60 LV SV Index:   34 LVOT Area:     3.46 cm  RIGHT VENTRICLE RV Basal diam:  3.20 cm RV Mid diam:    2.00 cm RV S prime:     13.80 cm/s TAPSE (M-mode): 3.0 cm LEFT ATRIUM             Index        RIGHT ATRIUM           Index LA diam:        3.50 cm 1.99 cm/m   RA Area:     16.70 cm LA Vol (A2C):   62.4 ml 35.44 ml/m  RA Volume:   47.10 ml  26.75 ml/m LA Vol (A4C):   39.8 ml 22.60 ml/m LA Biplane Vol: 53.0 ml 30.10 ml/m  AORTIC VALVE AV Area (Vmax):    2.67 cm AV Area (Vmean):   2.52 cm AV Area (VTI):     2.68 cm AV Vmax:           95.00 cm/s AV Vmean:          62.400 cm/s AV VTI:            0.225 m AV Peak Grad:      3.6 mmHg AV Mean Grad:      2.0 mmHg LVOT Vmax:         73.30 cm/s LVOT Vmean:        45.400 cm/s LVOT VTI:          0.174 m LVOT/AV VTI ratio: 0.77  AORTA Ao Root diam: 2.60 cm MITRAL VALVE               TRICUSPID VALVE MV Area (PHT): 2.84 cm    TR Peak grad:   23.8 mmHg MV Decel Time: 267 msec    TR Vmax:        244.00 cm/s MV E velocity: 51.00 cm/s MV A velocity: 56.60 cm/s  SHUNTS MV E/A ratio:  0.90        Systemic VTI:  0.17 m  Systemic Diam: 2.10 cm Kardie Tobb DO Electronically signed by Dub Huntsman DO Signature Date/Time: 02/21/2024/11:24:42 AM    Final    VAS US  LOWER EXTREMITY VENOUS (DVT) Result Date: 02/20/2024  Lower Venous DVT Study Patient Name:  Cody Glenn  Date of Exam:   02/20/2024 Medical Rec #: [id number]          Accession #:    [id number] Date of Birth: 1956-06-14           Patient Gender: M Patient Age:   62 years Exam Location:  [facility] Procedure:      VAS US  LOWER EXTREMITY VENOUS (DVT) Referring Phys: DORN CHILL  --------------------------------------------------------------------------------  Indications: Edema. Other Indications: Pt is being evaluated for TB. Performing Technologist: Elmarie Crump,RVT  Examination Guidelines: A complete evaluation includes B-mode imaging, spectral Doppler, color Doppler, and power Doppler as needed of all accessible portions of each vessel. Bilateral testing is considered an integral part of a complete examination. Limited examinations for reoccurring indications may be performed as noted. The reflux portion of the exam is performed with the patient in reverse Trendelenburg.  +---------+---------------+---------+-----------+----------+-------------------+ RIGHT    CompressibilityPhasicitySpontaneityPropertiesThrombus Aging      +---------+---------------+---------+-----------+----------+-------------------+ CFV      Partial                                                          +---------+---------------+---------+-----------+----------+-------------------+ SFJ                                                   Not well visualized +---------+---------------+---------+-----------+----------+-------------------+ FV Prox  None                                                             +---------+---------------+---------+-----------+----------+-------------------+ FV Mid   None                                                             +---------+---------------+---------+-----------+----------+-------------------+ FV DistalNone                                                             +---------+---------------+---------+-----------+----------+-------------------+ PFV      Full                                                             +---------+---------------+---------+-----------+----------+-------------------+ POP      None                                                              +---------+---------------+---------+-----------+----------+-------------------+  PTV      Full                                                             +---------+---------------+---------+-----------+----------+-------------------+ PERO     Full                                                             +---------+---------------+---------+-----------+----------+-------------------+ There appears to be chronic appearing, partially compressible thrombus in the CFV. There appears to be chronic appearing, noncompressible thrombus in the femoral and popliteal vein.  +---------+---------------+---------+-----------+----------+-------------------+ LEFT     CompressibilityPhasicitySpontaneityPropertiesThrombus Aging      +---------+---------------+---------+-----------+----------+-------------------+ CFV      Partial                                                          +---------+---------------+---------+-----------+----------+-------------------+ SFJ                                                   Not well visualized +---------+---------------+---------+-----------+----------+-------------------+ FV Prox  None                                                             +---------+---------------+---------+-----------+----------+-------------------+ FV Mid   None                                                             +---------+---------------+---------+-----------+----------+-------------------+ FV DistalNone                                                             +---------+---------------+---------+-----------+----------+-------------------+ PFV                                                   Not well visualized +---------+---------------+---------+-----------+----------+-------------------+ POP      Partial                                                           +---------+---------------+---------+-----------+----------+-------------------+  PTV                                                   Not well visualized +---------+---------------+---------+-----------+----------+-------------------+ PERO                                                  Not well visualized +---------+---------------+---------+-----------+----------+-------------------+ There is chronic appearing, partially compressible thrombus in the CFV and pop vein. 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 There is chronic appearing, noncompressible thrombus in the femoral vein.    Summary: RIGHT: - Findings consistent with chronic deep vein thrombosis involving the right common femoral vein, right femoral vein, and right popliteal vein.  - No cystic structure found in the popliteal fossa.  LEFT: - Findings consistent with chronic deep vein thrombosis involving the left common femoral vein, left femoral vein, and left popliteal vein.  - No cystic structure found in the popliteal fossa. 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  *See table(s) above for measurements and observations. Electronically signed by Debby Robertson on 02/20/2024 at 5:19:25 PM.    Final    US  Abdomen Limited RUQ (LIVER/GB) Result Date: 02/20/2024 CLINICAL DATA:  [id number] Cirrhosis (HCC) 92834 EXAM: ULTRASOUND ABDOMEN LIMITED RIGHT UPPER QUADRANT COMPARISON:  Ultrasound abdomen 07/02/2020, CT abdomen pelvis 02/20/2024 FINDINGS: Gallbladder: Gallbladder sludge within the gallbladder lumen. No gallstones or wall thickening visualized. No sonographic Murphy sign noted by sonographer. Common bile duct: Diameter: 4 mm. Liver: Slightly nodular hepatic contour. No focal lesion identified. Within normal limits in parenchymal echogenicity. Portal vein is patent on color Doppler imaging with normal direction of blood flow towards the liver. Other: None. IMPRESSION: 1. Gallbladder  sludge with no acute cholecystitis. 2. Cirrhosis. No focal liver lesions identified. Please note that liver protocol enhanced MR and CT are the most sensitive tests for the screening detection of hepatocellular carcinoma in the high risk setting of cirrhosis. Electronically Signed   By: Morgane  Naveau M.D.   On: 02/20/2024 16:48   CT Chest W Contrast Result Date: 02/20/2024 CLINICAL DATA:  Bilateral lower extremity swelling. Chest calcification. EXAM: CT CHEST WITH CONTRAST TECHNIQUE: Multidetector CT imaging of the chest was performed during intravenous contrast administration. RADIATION DOSE REDUCTION: This exam was performed according to the departmental dose-optimization program which includes automated exposure control, adjustment of the mA and/or kV according to patient size and/or use of iterative reconstruction technique. CONTRAST:  75mL OMNIPAQUE  IOHEXOL  300 MG/ML  SOLN COMPARISON:  Chest x-ray of same day. FINDINGS: Cardiovascular: 4.1 cm ascending thoracic aortic aneurysm. Normal cardiac size. No pericardial effusion. Coronary artery calcifications are noted. Mediastinum/Nodes: No enlarged mediastinal, hilar, or axillary lymph nodes. Thyroid  gland, trachea, and esophagus demonstrate no significant findings. Lungs/Pleura: No pneumothorax or pleural effusion is noted. Emphysematous disease is noted throughout both lungs. 7.4 x 4.1 cm cavitary abnormality is noted in left lung apex with peripheral solid component seen inferiorly highly concerning for neoplasm or malignancy, or possibly aspergilloma. Peripheral honeycombing is noted in both lung bases suggesting some degree of fibrosis. Upper Abdomen: No acute abnormality. Musculoskeletal: Nondisplaced right eighth rib fracture is noted. IMPRESSION: 7.4 x 4.1 cm cavitary abnormality is noted in  left lung apex with peripheral solid component seen inferiorly highly concerning for neoplasm or malignancy, or possibly aspergilloma. Emphysematous disease is  noted throughout both lungs with bullous seen in both upper lobes. Peripheral honeycombing is noted throughout both lungs concerning for possible pulmonary fibrosis. Nondisplaced right eighth rib fracture. 4.1 cm ascending thoracic aortic aneurysm. Recommend annual imaging followup by CTA or MRA. This recommendation follows 2010 ACCF/AHA/AATS/ACR/ASA/SCA/SCAI/SIR/STS/SVM Guidelines for the Diagnosis and Management of Patients with Thoracic Aortic Disease. Circulation. 2010; 121: Z733-z630. Aortic aneurysm NOS (ICD10-I71.9). Coronary calcifications are noted suggesting coronary artery disease. Aortic Atherosclerosis (ICD10-I70.0) and Emphysema (ICD10-J43.9). Electronically Signed   By: Lynwood Landy Raddle M.D.   On: 02/20/2024 14:42   DG Chest Portable 1 View Result Date: 02/20/2024 CLINICAL DATA:  Hypoxia.  Bilateral leg swelling. EXAM: PORTABLE CHEST 1 VIEW COMPARISON:  None Available. FINDINGS: Low lung volume. There is a faint most likely calcified opacity overlying the left mid lung zone measuring 3.5 x 3.7 cm. This may represent pleural calcification versus parenchymal calcification. If prior imaging is available, comparison can be made. There are diffuse mild ground-glass opacities throughout bilateral lungs, which is nonspecific and may be due to expiratory imaging. No dense consolidation or lung collapse. Bilateral lateral costophrenic angles are clear. Normal cardio-mediastinal silhouette. No acute osseous abnormalities. The soft tissues are within normal limits. IMPRESSION: 1. There is a faint most likely calcified opacity overlying the left mid lung zone measuring 3.5 x 3.7 cm. This may represent pleural calcification versus parenchymal calcification. If prior imaging is available, comparison can be made. Otherwise, consider better evaluation with chest CT scan. 2. There are diffuse mild ground-glass opacities throughout bilateral lungs, which is nonspecific and may be due to expiratory imaging. No dense  consolidation or lung collapse. Electronically Signed   By: Ree Molt M.D.   On: 02/20/2024 12:41     Subjective: No acute issues/events overnight   Discharge Exam: Vitals:   02/27/24 2229 02/28/24 0444  BP: (!) 159/94 (!) 159/88  Pulse: 62 (!) 53  Resp: 16 18  Temp: 99.4 F (37.4 C) 97.9 F (36.6 C)  SpO2: 100% 100%   Vitals:   02/27/24 0647 02/27/24 1337 02/27/24 2229 02/28/24 0444  BP: (!) 141/89 132/74 (!) 159/94 (!) 159/88  Pulse: 71 65 62 (!) 53  Resp: 18 16 16 18   Temp: 98 F (36.7 C) 98.5 F (36.9 C) 99.4 F (37.4 C) 97.9 F (36.6 C)  TempSrc: Oral Oral Oral Oral  SpO2: 100% 100% 100% 100%  Weight:      Height:        General: Pt is alert, awake, not in acute distress Cardiovascular: RRR, S1/S2 +, no rubs, no gallops Respiratory: CTA bilaterally, no wheezing, no rhonchi Abdominal: Soft, NT, ND, bowel sounds + Extremities: no edema, no cyanosis    The results of significant diagnostics from this hospitalization (including imaging, microbiology, ancillary and laboratory) are listed below for reference.     Microbiology: Recent Results (from the past 240 hours)  MRSA Next Gen by PCR, Nasal     Status: None   Collection Time: 02/20/24  5:23 PM   Specimen: Nasal Mucosa; Nasal Swab  Result Value Ref Range Status   MRSA by PCR Next Gen NOT DETECTED NOT DETECTED Final    Comment: (NOTE) The GeneXpert MRSA Assay (FDA approved for NASAL specimens only), is one component of a comprehensive MRSA colonization surveillance program. It is not intended to diagnose MRSA infection nor to guide or monitor  treatment for MRSA infections. Test performance is not FDA approved in patients less than 8 years old. Performed at [facility], [address], [place], [place] [address]   Culture, BAL-quantitative w Gram Stain     Status: None   Collection Time: 02/21/24  2:00 PM   Specimen: Bronchial Alveolar Lavage; Respiratory  Result Value Ref  Range Status   Specimen Description   Final    BRONCHIAL ALVEOLAR LAVAGE Performed at [facility], [address], [place], [place] [address]    Special Requests   Final    NONE Performed at [facility], [address], [place], [place] [address]    Gram Stain NO WBC SEEN NO ORGANISMS SEEN   Final   Culture   Final    NO GROWTH 2 DAYS Performed at [facility], [address], [place], [place] [address]    Report Status 02/28/2024 FINAL  Final  Anaerobic culture w Gram Stain     Status: None (Preliminary result)   Collection Time: 02/21/24  2:00 PM   Specimen: Bronchoalveolar Lavage  Result Value Ref Range Status   Specimen Description BRONCHIAL ALVEOLAR LAVAGE  Final   Special Requests NONE  Final   Gram Stain   Final    RARE WBC PRESENT, PREDOMINANTLY PMN NO ORGANISMS SEEN Performed at [facility], [address] [place], [place] [address]    Culture   Final    NO ANAEROBES ISOLATED; CULTURE IN PROGRESS FOR 5 DAYS   Report Status PENDING  Incomplete  Acid Fast Smear (AFB)     Status: None   Collection Time: 02/22/24 12:11 PM   Specimen: Bronchial Alveolar Lavage; Respiratory  Result Value Ref Range Status   AFB Specimen Processing Concentration  Final   Acid Fast Smear Negative  Final    Comment: (NOTE) Performed At: [facility] [address] [place], [place] [id number] Jennette Shorter MD Ey:[id number]    Source (AFB) BRONCHIAL ALVEOLAR LAVAGE  Final    Comment: Performed at [facility], [address], [place], [place] [address]     Labs: BNP (last 3 results) Recent Labs    02/20/24 1223  BNP 81.6   Basic Metabolic Panel: Recent Labs  Lab 02/22/24 0640 02/23/24 0338 02/24/24 0350 02/27/24 0346  NA 128* 130* 131* 128*  K 3.7 3.1* 3.7 3.2*  CL 98 103 105 100  CO2 21* 20* 19* 22  GLUCOSE 86 109* 139* 91  BUN 9 8 10 11   CREATININE 0.62 0.88 0.73 0.67  CALCIUM 8.4* 8.3*  8.3* 7.9*   Liver Function Tests: No results for input(s): AST, ALT, ALKPHOS, BILITOT, PROT, ALBUMIN in the last 168 hours. No results for input(s): LIPASE, AMYLASE in the last 168 hours. No results for input(s): AMMONIA in the last 168 hours. CBC: Recent Labs  Lab 02/22/24 0302 02/23/24 0338 02/24/24 0350  WBC 9.7 7.4 8.1  HGB 8.5* 7.6* 8.3*  HCT 25.9* 22.8* 24.9*  MCV 94.9 93.8 95.0  PLT 252 242 205   Cardiac Enzymes: No results for input(s): CKTOTAL, CKMB, CKMBINDEX, TROPONINI in the last 168 hours. BNP: Invalid input(s): POCBNP CBG: No results for input(s): GLUCAP in the last 168 hours. D-Dimer No results for input(s): DDIMER in the last 72 hours. Hgb A1c No results for input(s): HGBA1C in the last 72 hours. Lipid Profile No results for input(s): CHOL, HDL, LDLCALC, TRIG, CHOLHDL, LDLDIRECT in the last 72 hours. Thyroid   function studies No results for input(s): TSH, T4TOTAL, T3FREE, THYROIDAB in the last 72 hours.  Invalid input(s): FREET3 Anemia work up No results for input(s): VITAMINB12, FOLATE, FERRITIN, TIBC, IRON, RETICCTPCT in the last 72 hours. Urinalysis    Component Value Date/Time   COLORURINE AMBER (A) 06/29/2020 1933   APPEARANCEUR CLEAR 06/29/2020 1933   LABSPEC 1.020 06/29/2020 1933   PHURINE 6.0 06/29/2020 1933   GLUCOSEU NEGATIVE 06/29/2020 1933   HGBUR NEGATIVE 06/29/2020 1933   BILIRUBINUR NEGATIVE 06/29/2020 1933   KETONESUR 20 (A) 06/29/2020 1933   PROTEINUR 100 (A) 06/29/2020 1933   NITRITE NEGATIVE 06/29/2020 1933   LEUKOCYTESUR NEGATIVE 06/29/2020 1933   Sepsis Labs Recent Labs  Lab 02/22/24 0302 02/23/24 0338 02/24/24 0350  WBC 9.7 7.4 8.1   Microbiology Recent Results (from the past 240 hours)  MRSA Next Gen by PCR, Nasal     Status: None   Collection Time: 02/20/24  5:23 PM   Specimen: Nasal Mucosa; Nasal Swab  Result Value Ref Range Status   MRSA by PCR  Next Gen NOT DETECTED NOT DETECTED Final    Comment: (NOTE) The GeneXpert MRSA Assay (FDA approved for NASAL specimens only), is one component of a comprehensive MRSA colonization surveillance program. It is not intended to diagnose MRSA infection nor to guide or monitor treatment for MRSA infections. Test performance is not FDA approved in patients less than 68 years old. Performed at [facility], [address], [place], [place] [address]   Culture, BAL-quantitative w Gram Stain     Status: None   Collection Time: 02/21/24  2:00 PM   Specimen: Bronchial Alveolar Lavage; Respiratory  Result Value Ref Range Status   Specimen Description   Final    BRONCHIAL ALVEOLAR LAVAGE Performed at [facility], [address], [place], [place] [address]    Special Requests   Final    NONE Performed at [facility], [address] [place], [place] [address]    Gram Stain NO WBC SEEN NO ORGANISMS SEEN   Final   Culture   Final    NO GROWTH 2 DAYS Performed at [facility], [address], [place], [place] [address]    Report Status 02/28/2024 FINAL  Final  Anaerobic culture w Gram Stain     Status: None (Preliminary result)   Collection Time: 02/21/24  2:00 PM   Specimen: Bronchoalveolar Lavage  Result Value Ref Range Status   Specimen Description BRONCHIAL ALVEOLAR LAVAGE  Final   Special Requests NONE  Final   Gram Stain   Final    RARE WBC PRESENT, PREDOMINANTLY PMN NO ORGANISMS SEEN Performed at [facility], [address] [place], [place] [address]    Culture   Final    NO ANAEROBES ISOLATED; CULTURE IN PROGRESS FOR 5 DAYS   Report Status PENDING  Incomplete  Acid Fast Smear (AFB)     Status: None   Collection Time: 02/22/24 12:11 PM   Specimen: Bronchial Alveolar Lavage; Respiratory  Result Value Ref Range Status   AFB Specimen Processing Concentration  Final   Acid Fast Smear Negative  Final     Comment: (NOTE) Performed At: [facility] [address] [place], [place] [id number] Jennette Shorter MD Ey:[id number]    Source (AFB) BRONCHIAL ALVEOLAR LAVAGE  Final    Comment: Performed at [facility], [address] [place], [place] [address]     Time coordinating discharge: Over  30 minutes  SIGNED:   Elsie JAYSON Montclair, DO [facility] 02/28/2024, 1:53 PM Pager   If 7PM-7AM, please contact night-coverage [email or web address]

## 2024-02-28 NOTE — Plan of Care (Signed)

## 2024-02-28 NOTE — Progress Notes (Signed)
 Discharge medications delivered in a secure bag to patient at bedside D Johnie RN

## 2024-02-28 NOTE — Progress Notes (Signed)
 Id brief update  MRI unremarkableok to DC and finish 3 week augmentin , once cleared with pulm  [employer] will follow tb culture  Please ensure patient has working correct address and phone  Discuss with primary team

## 2024-02-28 NOTE — Plan of Care (Signed)
  Problem: Education: Goal: Knowledge of General Education information will improve Description: Including pain rating scale, medication(s)/side effects and non-pharmacologic comfort measures 02/28/2024 0952 by Alaina Dozier PARAS, RN Outcome: Adequate for Discharge 02/28/2024 [phone] by Alaina Dozier PARAS, RN Outcome: Progressing   Problem: Health Behavior/Discharge Planning: Goal: Ability to manage health-related needs will improve 02/28/2024 0952 by Alaina Dozier PARAS, RN Outcome: Adequate for Discharge 02/28/2024 [phone] by Alaina Dozier PARAS, RN Outcome: Progressing   Problem: Clinical Measurements: Goal: Ability to maintain clinical measurements within normal limits will improve 02/28/2024 0952 by Alaina Dozier PARAS, RN Outcome: Adequate for Discharge 02/28/2024 [phone] by Alaina Dozier PARAS, RN Outcome: Progressing Goal: Will remain free from infection 02/28/2024 0952 by Alaina Dozier PARAS, RN Outcome: Adequate for Discharge 02/28/2024 [phone] by Alaina Dozier PARAS, RN Outcome: Progressing Goal: Diagnostic test results will improve 02/28/2024 0952 by Alaina Dozier PARAS, RN Outcome: Adequate for Discharge 02/28/2024 [phone] by Alaina Dozier PARAS, RN Outcome: Progressing Goal: Respiratory complications will improve Outcome: Adequate for Discharge Goal: Cardiovascular complication will be avoided Outcome: Adequate for Discharge   Problem: Activity: Goal: Risk for activity intolerance will decrease Outcome: Adequate for Discharge   Problem: Nutrition: Goal: Adequate nutrition will be maintained Outcome: Adequate for Discharge   Problem: Coping: Goal: Level of anxiety will decrease Outcome: Adequate for Discharge   Problem: Elimination: Goal: Will not experience complications related to bowel motility Outcome: Adequate for Discharge Goal: Will not experience complications related to urinary retention Outcome: Adequate for Discharge   Problem: Pain  Managment: Goal: General experience of comfort will improve and/or be controlled Outcome: Adequate for Discharge   Problem: Safety: Goal: Ability to remain free from injury will improve Outcome: Adequate for Discharge   Problem: Skin Integrity: Goal: Risk for impaired skin integrity will decrease Outcome: Adequate for Discharge

## 2024-02-28 NOTE — Telephone Encounter (Signed)
 Patient Product/process development scientist completed.    The patient is insured through [employer]. Patient has Medicare and is not eligible for a copay card, but may be able to apply for patient assistance or Medicare RX Payment Plan (Patient Must reach out to their plan, if eligible for payment plan), if available.    Ran test claim for amoxicillin -clavulanate 875-125 mg and the current 30 day co-pay is $4.90.   This test claim was processed through [facility]- copay amounts may vary at other pharmacies due to pharmacy/plan contracts, or as the patient moves through the different stages of their insurance plan.     Reyes Sharps, CPHT Pharmacy Technician III Certified Patient Advocate [facility] Patient Advocate Team Direct Number: [phone]  Fax: [phone]

## 2024-02-28 NOTE — TOC Transition Note (Signed)
 Transition of Care [facility]) - Discharge Note   Patient Details  Name: Cody Glenn MRN: [id number] Date of Birth: [birth year]  Transition of Care [facility]) CM/SW Contact:  Alfonse JONELLE Rex, RN Phone Number: 02/28/2024, 11:32 AM   Clinical Narrative:   DC order. Patient requesting assistance with transportation to get downtown, provided bus pass. No further TOC needs identified at this time.     Final next level of care: Homeless Shelter Barriers to Discharge: Barriers Resolved   Patient Goals and CMS Choice Patient states their goals for this hospitalization and ongoing recovery are:: To get better and find housing CMS Medicare.gov Compare Post Acute Care list provided to:: Other (Comment Required) (NA) Choice offered to / list presented to : NA [facility] ownership interest in [facility].provided to:: Parent NA    Discharge Placement                       Discharge Plan and Services Additional resources added to the After Visit Summary for   In-house Referral: Clinical Social Work Discharge Planning Services: CM Consult Post Acute Care Choice: NA          DME Arranged: N/A DME Agency: NA       HH Arranged: NA HH Agency: NA        Social Drivers of Health (SDOH) Interventions SDOH Screenings   Food Insecurity: Food Insecurity Present (02/21/2024)  Housing: High Risk (02/21/2024)  Transportation Needs: No Transportation Needs (02/21/2024)  Utilities: Not At Risk (02/21/2024)  Depression (PHQ2-9): Low Risk  (10/13/2020)  Social Connections: Moderately Integrated (02/21/2024)  Tobacco Use: High Risk (02/21/2024)     Readmission Risk Interventions    02/22/2024    2:07 PM  Readmission Risk Prevention Plan  Post Dischage Appt Complete  Medication Screening Complete  Transportation Screening Complete

## 2024-02-28 NOTE — Progress Notes (Signed)
 IV's removed, belongings packed, dressed, instructions reviewed with understanding verbalized, taxi voucher requested for a ride to downtown.

## 2024-02-29 ENCOUNTER — Telehealth: Payer: Self-pay

## 2024-02-29 NOTE — Transitions of Care (Post Inpatient/ED Visit) (Signed)
   02/29/2024  Name: Cody Glenn MRN: [id number] DOB: 1956/01/19  Today's TOC FU Call Status: Today's TOC FU Call Status:: Unsuccessful Call (1st Attempt) Unsuccessful Call (1st Attempt) Date: 02/29/24  Attempted to reach the patient regarding the most recent Inpatient/ED visit.  Follow Up Plan: Additional outreach attempts will be made to reach the patient to complete the Transitions of Care (Post Inpatient/ED visit) call.   I also called [phone] and the recording stated that the call cannot be completed as dialed.  Signature   Slater Diesel, RN

## 2024-03-01 ENCOUNTER — Other Ambulatory Visit (HOSPITAL_COMMUNITY): Payer: Self-pay

## 2024-03-02 LAB — ANAEROBIC CULTURE W GRAM STAIN

## 2024-03-04 ENCOUNTER — Telehealth: Payer: Self-pay

## 2024-03-04 NOTE — Transitions of Care (Post Inpatient/ED Visit) (Signed)
   03/04/2024  Name: Cody Glenn MRN: [id number] DOB: [birth year]  Today's TOC FU Call Status: Today's TOC FU Call Status:: Unsuccessful Call (2nd Attempt) Unsuccessful Call (1st Attempt) Date: 02/29/24 Unsuccessful Call (2nd Attempt) Date: 03/04/24  Attempted to reach the patient regarding the most recent Inpatient/ED visit.  Follow Up Plan: Additional outreach attempts will be made to reach the patient to complete the Transitions of Care (Post Inpatient/ED visit) call.   I also called [phone] and the recording stated that the call cannot be completed at this time.   Dr Brien is listed as PCP but the patient has not seen him in the clinic since 10/2020 and Dr Brien is now retired.     Signature Slater Diesel, RN

## 2024-03-05 ENCOUNTER — Telehealth: Payer: Self-pay

## 2024-03-05 NOTE — Transitions of Care (Post Inpatient/ED Visit) (Signed)
   03/05/2024  Name: Cody Glenn MRN: [id number] DOB: 05-22-1956  Today's TOC FU Call Status: Today's TOC FU Call Status:: Unsuccessful Call (3rd Attempt) Unsuccessful Call (1st Attempt) Date: 02/29/24 Unsuccessful Call (2nd Attempt) Date: 03/04/24 Unsuccessful Call (3rd Attempt) Date: 03/05/24  Attempted to reach the patient regarding the most recent Inpatient/ED visit.  Follow Up Plan: No further outreach attempts will be made at this time. We have been unable to contact the patient.  I called the numbers listed for the patient: [phone] and the message stated that the call cannot be completed at this time. I called [phone] and the message stated that the call cannot be completed as dialed   The patient was last seen at [facility] 10/2020.  Letter sent to patient requesting he contact [facility] to schedule an appointment as we have not been able to reach him .  Signature  Slater Diesel, RN

## 2024-03-18 ENCOUNTER — Other Ambulatory Visit: Payer: Self-pay

## 2024-03-18 ENCOUNTER — Emergency Department (HOSPITAL_COMMUNITY)
Admission: EM | Admit: 2024-03-18 | Discharge: 2024-03-18 | Disposition: A | Discharge status: Home / Self Care | Attending: Emergency Medicine | Admitting: Emergency Medicine

## 2024-03-18 ENCOUNTER — Encounter (HOSPITAL_COMMUNITY): Payer: Self-pay

## 2024-03-18 DIAGNOSIS — E8809 Other disorders of plasma-protein metabolism, not elsewhere classified: Secondary | ICD-10-CM

## 2024-03-18 DIAGNOSIS — F1090 Alcohol use, unspecified, uncomplicated: Secondary | ICD-10-CM | POA: Diagnosis present

## 2024-03-18 DIAGNOSIS — F10220 Alcohol dependence with intoxication, uncomplicated: Secondary | ICD-10-CM | POA: Insufficient documentation

## 2024-03-18 DIAGNOSIS — Y907 Blood alcohol level of 200-239 mg/100 ml: Secondary | ICD-10-CM | POA: Diagnosis not present

## 2024-03-18 DIAGNOSIS — Z79899 Other long term (current) drug therapy: Secondary | ICD-10-CM | POA: Diagnosis not present

## 2024-03-18 DIAGNOSIS — E871 Hypo-osmolality and hyponatremia: Secondary | ICD-10-CM | POA: Insufficient documentation

## 2024-03-18 DIAGNOSIS — F10920 Alcohol use, unspecified with intoxication, uncomplicated: Secondary | ICD-10-CM

## 2024-03-18 DIAGNOSIS — F10129 Alcohol abuse with intoxication, unspecified: Secondary | ICD-10-CM | POA: Diagnosis not present

## 2024-03-18 DIAGNOSIS — D649 Anemia, unspecified: Secondary | ICD-10-CM

## 2024-03-18 LAB — CBC
HCT: 31.7 % — ABNORMAL LOW (ref 39.0–52.0)
Hemoglobin: 10.7 g/dL — ABNORMAL LOW (ref 13.0–17.0)
MCH: 32.3 pg (ref 26.0–34.0)
MCHC: 33.8 g/dL (ref 30.0–36.0)
MCV: 95.8 fL (ref 80.0–100.0)
Platelets: 282 K/uL (ref 150–400)
RBC: 3.31 MIL/uL — ABNORMAL LOW (ref 4.22–5.81)
RDW: 14.9 % (ref 11.5–15.5)
WBC: 11 K/uL — ABNORMAL HIGH (ref 4.0–10.5)
nRBC: 0 % (ref 0.0–0.2)

## 2024-03-18 LAB — COMPREHENSIVE METABOLIC PANEL WITH GFR
ALT: 14 U/L (ref 0–44)
AST: 24 U/L (ref 15–41)
Albumin: 2.4 g/dL — ABNORMAL LOW (ref 3.5–5.0)
Alkaline Phosphatase: 88 U/L (ref 38–126)
Anion gap: 10 (ref 5–15)
BUN: 5 mg/dL — ABNORMAL LOW (ref 8–23)
CO2: 21 mmol/L — ABNORMAL LOW (ref 22–32)
Calcium: 8.4 mg/dL — ABNORMAL LOW (ref 8.9–10.3)
Chloride: 99 mmol/L (ref 98–111)
Creatinine, Ser: 0.5 mg/dL — ABNORMAL LOW (ref 0.61–1.24)
GFR, Estimated: >60 mL/min (ref >60)
Glucose, Bld: 80 mg/dL (ref 70–99)
Potassium: 3.9 mmol/L (ref 3.5–5.1)
Sodium: 130 mmol/L — ABNORMAL LOW (ref 135–145)
Total Bilirubin: 1.5 mg/dL — ABNORMAL HIGH (ref 0.0–1.2)
Total Protein: 6.3 g/dL — ABNORMAL LOW (ref 6.5–8.1)

## 2024-03-18 LAB — ETHANOL: Alcohol, Ethyl (B): 236 mg/dL — ABNORMAL HIGH (ref <15)

## 2024-03-18 NOTE — Discharge Instructions (Addendum)
 It was our pleasure to provide your ER care today - we hope that you feel better. Drink plenty of (non-alcohol containing) fluids/stay well hydrated.   For recent issues found during your hospital admission, make sure to follow up closely with primary care doctor in the next 1-2 weeks - make sure they review all your lab and imaging studies, and arrange appropriate follow up for you.   Avoid alcohol use as it is harmful to your physical health and mental well-being. See resource guide attached in terms of accessing inpatient or outpatient substance use treatment programs.   Make sure to never drive if/when drinking alcohol.   Follow up closely with primary care doctor and behavioral health provider in the coming week.  For mental health issues and/or crisis, you may also go directly to the [facility] - they are open 24/7 and walk-ins are welcome.    Return to ER if worse, new symptoms, fevers, chest pain, trouble breathing, or other emergency concern.

## 2024-03-18 NOTE — ED Notes (Signed)
 Pt aware of needing urine sample. Pt peed in bed and went to bathroom without collecting sample.

## 2024-03-18 NOTE — ED Notes (Signed)
 Pt independently dressed self for discharge and given bus pass for transport. All belongings returned. Reviewed d/c instructions with pt and pt verbalized understanding.

## 2024-03-18 NOTE — ED Provider Notes (Addendum)
 [place] EMERGENCY DEPARTMENT AT [facility] Provider Note   CSN: [id number] Arrival date & time: 03/18/24  1303     Patient presents with: Alcohol Intoxication   Cody Glenn is a 68 y.o. male.   Pt reportedly from parking lot - was in car, ?etoh intoxication. Currently pt denies specific physical or mental health complaint and indicates he just wants something to eat and drink.  Pt limited historian - level 5 caveat. Denies headache. No neck or back pain. No chest pain or sob. No abd pain or nvd. No gu c/o. No fever or chills. Denies depression. No thoughts of harm to self or others.   The history is provided by the patient, medical records and the EMS personnel. The history is limited by the condition of the patient.  Alcohol Intoxication Pertinent negatives include no chest pain, no abdominal pain, no headaches and no shortness of breath.       Prior to Admission medications   Medication Sig Start Date End Date Taking? Authorizing Provider  folic acid  (FOLVITE ) 1 MG tablet Take 1 tablet (1 mg total) by mouth daily. 02/28/24   Lue Elsie BROCKS, MD  thiamine  (VITAMIN B1) 100 MG tablet Take 1 tablet (100 mg total) by mouth daily. 02/28/24   Lue Elsie BROCKS, MD    Allergies: Patient has no known allergies.    Review of Systems  Constitutional:  Negative for chills and fever.  HENT:  Negative for sore throat.   Respiratory:  Negative for cough and shortness of breath.   Cardiovascular:  Negative for chest pain.  Gastrointestinal:  Negative for abdominal pain, diarrhea and vomiting.  Genitourinary:  Negative for difficulty urinating, dysuria and flank pain.  Musculoskeletal:  Negative for neck pain.       Hx back pain - denies acute or abrupt worsening. No new numbness/weakness.  Neurological:  Negative for headaches.    Updated Vital Signs BP 107/72 (BP Location: Left Arm)   Pulse 71   Temp 98.8 F (37.1 C) (Oral)   Resp 16   Ht 1.651 m (5' 5)    Wt 69 kg   SpO2 100%   BMI 25.31 kg/m   Physical Exam Vitals and nursing note reviewed.  Constitutional:      Appearance: Normal appearance. He is well-developed.  HENT:     Head: Atraumatic.     Nose: Nose normal.     Mouth/Throat:     Mouth: Mucous membranes are moist.     Pharynx: Oropharynx is clear.  Eyes:     General: No scleral icterus.    Conjunctiva/sclera: Conjunctivae normal.     Pupils: Pupils are equal, round, and reactive to light.  Neck:     Trachea: No tracheal deviation.  Cardiovascular:     Rate and Rhythm: Normal rate and regular rhythm.     Pulses: Normal pulses.     Heart sounds: Normal heart sounds. No murmur heard.    No friction rub. No gallop.  Pulmonary:     Effort: Pulmonary effort is normal. No accessory muscle usage or respiratory distress.     Breath sounds: Normal breath sounds.  Abdominal:     General: Bowel sounds are normal. There is no distension.     Palpations: Abdomen is soft.     Tenderness: There is no abdominal tenderness.  Genitourinary:    Comments: No cva tenderness. Musculoskeletal:        General: No swelling or tenderness.  Cervical back: Normal range of motion and neck supple. No rigidity.  Skin:    General: Skin is warm and dry.     Findings: No rash.  Neurological:     Mental Status: He is alert.     Comments: Alert, speech clear. Motor/sens grossly intact bil.   Psychiatric:        Mood and Affect: Mood normal.     (all labs ordered are listed, but only abnormal results are displayed) Results for orders placed or performed during the hospital encounter of 03/18/24  Ethanol   Collection Time: 03/18/24  1:51 PM  Result Value Ref Range   Alcohol, Ethyl (B) 236 (H) <15 mg/dL  CBC   Collection Time: 03/18/24  2:48 PM  Result Value Ref Range   WBC 11.0 (H) 4.0 - 10.5 K/uL   RBC 3.31 (L) 4.22 - 5.81 MIL/uL   Hemoglobin 10.7 (L) 13.0 - 17.0 g/dL   HCT 68.2 (L) 60.9 - 47.9 %   MCV 95.8 80.0 - 100.0 fL   MCH  32.3 26.0 - 34.0 pg   MCHC 33.8 30.0 - 36.0 g/dL   RDW 85.0 88.4 - 84.4 %   Platelets 282 150 - 400 K/uL   nRBC 0.0 0.0 - 0.2 %  Comprehensive metabolic panel with GFR   Collection Time: 03/18/24  2:48 PM  Result Value Ref Range   Sodium 130 (L) 135 - 145 mmol/L   Potassium 3.9 3.5 - 5.1 mmol/L   Chloride 99 98 - 111 mmol/L   CO2 21 (L) 22 - 32 mmol/L   Glucose, Bld 80 70 - 99 mg/dL   BUN 5 (L) 8 - 23 mg/dL   Creatinine, Ser 9.49 (L) 0.61 - 1.24 mg/dL   Calcium 8.4 (L) 8.9 - 10.3 mg/dL   Total Protein 6.3 (L) 6.5 - 8.1 g/dL   Albumin 2.4 (L) 3.5 - 5.0 g/dL   AST 24 15 - 41 U/L   ALT 14 0 - 44 U/L   Alkaline Phosphatase 88 38 - 126 U/L   Total Bilirubin 1.5 (H) 0.0 - 1.2 mg/dL   GFR, Estimated >39 >39 mL/min   Anion gap 10 5 - 15   EKG: None  Radiology: No results found.   Procedures   Medications Ordered in the ED - No data to display                                  Medical Decision Making Problems Addressed: Acute alcoholic intoxication without complication [facility]): acute illness or injury with systemic symptoms that poses a threat to life or bodily functions Chronic alcohol use: chronic illness or injury that poses a threat to life or bodily functions Chronic anemia: chronic illness or injury Hypoalbuminemia: chronic illness or injury Hyponatremia: chronic illness or injury  Amount and/or Complexity of Data Reviewed Independent Historian: EMS    Details: hx External Data Reviewed: notes. Labs: ordered. Decision-making details documented in ED Course.  Risk Decision regarding hospitalization.   Labs ordered/sent.  Differential diagnosis includes etoh use disorder, dehydration, etc. Dispo decision including potential need for admission considered - will get labs and reassess.   Reviewed nursing notes and prior charts for additional history. External reports reviewed. Additional history from: EMS.   Labs reviewed/interpreted by me - wbc 11, hct 32. Chem w  na mildly low. K normal. Labs similar to prior. Etoh is high, 236.   Po  fluids/food provided.   Recheck, pt denies new/additional complaints, indicates feels ready for d/c. Ambulate in hall.    Pt currently appears stable for d/c per his request.  (Note that currently HR is not 130, hr is currently 78, rr 16, pulse ox 99%, bp 135/77, checked by myself).   Rec close pcp f/u - pt indicates he will f/u closely as directed.  Also encourage etoh cessation and resources provided.   Return precautions provided.       Final diagnoses:  None    ED Discharge Orders     None          Bernard Drivers, MD 03/18/24 1706

## 2024-03-18 NOTE — ED Notes (Signed)
Pt provided with sandwich bag and water.

## 2024-03-18 NOTE — ED Triage Notes (Signed)
 Pt BIB [facility] from a parking lot in his car. Pt endorses needing help to stop drinking alcohol. Hx of daily alcohol use.

## 2024-03-28 LAB — FUNGUS CULTURE RESULT

## 2024-03-28 LAB — FUNGUS CULTURE WITH STAIN

## 2024-03-28 LAB — FUNGAL ORGANISM REFLEX

## 2024-04-10 LAB — ACID FAST CULTURE WITH REFLEXED SENSITIVITIES (MYCOBACTERIA): Acid Fast Culture: NEGATIVE

## 2024-04-14 LAB — MTB-RIF NAA W AFB CULT, NON-SPUTUM
Acid Fast Culture: NEGATIVE
M tuberculosis complex: NOT DETECTED

## 2024-04-15 ENCOUNTER — Encounter (HOSPITAL_COMMUNITY): Payer: Self-pay

## 2024-04-15 ENCOUNTER — Other Ambulatory Visit: Payer: Self-pay

## 2024-04-15 ENCOUNTER — Inpatient Hospital Stay (HOSPITAL_COMMUNITY)
Admission: EM | Admit: 2024-04-15 | Discharge: 2024-05-02 | DRG: 871 | Disposition: A | Discharge status: Skilled Nursing Facility | Attending: Internal Medicine | Admitting: Internal Medicine

## 2024-04-15 DIAGNOSIS — F10229 Alcohol dependence with intoxication, unspecified: Secondary | ICD-10-CM | POA: Diagnosis present

## 2024-04-15 DIAGNOSIS — E872 Acidosis, unspecified: Secondary | ICD-10-CM | POA: Diagnosis present

## 2024-04-15 DIAGNOSIS — Z66 Do not resuscitate: Secondary | ICD-10-CM | POA: Diagnosis present

## 2024-04-15 DIAGNOSIS — R627 Adult failure to thrive: Secondary | ICD-10-CM | POA: Diagnosis present

## 2024-04-15 DIAGNOSIS — E860 Dehydration: Secondary | ICD-10-CM | POA: Diagnosis present

## 2024-04-15 DIAGNOSIS — F1010 Alcohol abuse, uncomplicated: Secondary | ICD-10-CM

## 2024-04-15 DIAGNOSIS — E11649 Type 2 diabetes mellitus with hypoglycemia without coma: Secondary | ICD-10-CM | POA: Diagnosis present

## 2024-04-15 DIAGNOSIS — I110 Hypertensive heart disease with heart failure: Secondary | ICD-10-CM | POA: Diagnosis present

## 2024-04-15 DIAGNOSIS — D649 Anemia, unspecified: Secondary | ICD-10-CM | POA: Diagnosis present

## 2024-04-15 DIAGNOSIS — M1990 Unspecified osteoarthritis, unspecified site: Secondary | ICD-10-CM | POA: Diagnosis present

## 2024-04-15 DIAGNOSIS — R9431 Abnormal electrocardiogram [ECG] [EKG]: Secondary | ICD-10-CM | POA: Diagnosis present

## 2024-04-15 DIAGNOSIS — J439 Emphysema, unspecified: Secondary | ICD-10-CM | POA: Diagnosis present

## 2024-04-15 DIAGNOSIS — I7121 Aneurysm of the ascending aorta, without rupture: Secondary | ICD-10-CM | POA: Diagnosis present

## 2024-04-15 DIAGNOSIS — R042 Hemoptysis: Secondary | ICD-10-CM | POA: Diagnosis present

## 2024-04-15 DIAGNOSIS — Y906 Blood alcohol level of 120-199 mg/100 ml: Secondary | ICD-10-CM | POA: Diagnosis present

## 2024-04-15 DIAGNOSIS — I82443 Acute embolism and thrombosis of tibial vein, bilateral: Secondary | ICD-10-CM | POA: Diagnosis present

## 2024-04-15 DIAGNOSIS — E16A3 Hypoglycemia level 3: Secondary | ICD-10-CM | POA: Diagnosis present

## 2024-04-15 DIAGNOSIS — A039 Shigellosis, unspecified: Secondary | ICD-10-CM | POA: Diagnosis present

## 2024-04-15 DIAGNOSIS — A4154 Sepsis due to Acinetobacter baumannii: Principal | ICD-10-CM | POA: Diagnosis present

## 2024-04-15 DIAGNOSIS — I5021 Acute systolic (congestive) heart failure: Secondary | ICD-10-CM

## 2024-04-15 DIAGNOSIS — W19XXXA Unspecified fall, initial encounter: Secondary | ICD-10-CM | POA: Diagnosis present

## 2024-04-15 DIAGNOSIS — I10 Essential (primary) hypertension: Secondary | ICD-10-CM | POA: Diagnosis present

## 2024-04-15 DIAGNOSIS — Z903 Acquired absence of stomach [part of]: Secondary | ICD-10-CM

## 2024-04-15 DIAGNOSIS — E739 Lactose intolerance, unspecified: Secondary | ICD-10-CM | POA: Diagnosis present

## 2024-04-15 DIAGNOSIS — E871 Hypo-osmolality and hyponatremia: Secondary | ICD-10-CM | POA: Diagnosis not present

## 2024-04-15 DIAGNOSIS — M48061 Spinal stenosis, lumbar region without neurogenic claudication: Secondary | ICD-10-CM | POA: Diagnosis present

## 2024-04-15 DIAGNOSIS — J440 Chronic obstructive pulmonary disease with acute lower respiratory infection: Secondary | ICD-10-CM | POA: Diagnosis present

## 2024-04-15 DIAGNOSIS — Z7189 Other specified counseling: Secondary | ICD-10-CM

## 2024-04-15 DIAGNOSIS — K7030 Alcoholic cirrhosis of liver without ascites: Secondary | ICD-10-CM | POA: Diagnosis present

## 2024-04-15 DIAGNOSIS — E162 Hypoglycemia, unspecified: Secondary | ICD-10-CM | POA: Diagnosis not present

## 2024-04-15 DIAGNOSIS — E43 Unspecified severe protein-calorie malnutrition: Secondary | ICD-10-CM | POA: Diagnosis present

## 2024-04-15 DIAGNOSIS — I82413 Acute embolism and thrombosis of femoral vein, bilateral: Secondary | ICD-10-CM | POA: Diagnosis present

## 2024-04-15 DIAGNOSIS — R64 Cachexia: Secondary | ICD-10-CM | POA: Diagnosis present

## 2024-04-15 DIAGNOSIS — F1090 Alcohol use, unspecified, uncomplicated: Secondary | ICD-10-CM | POA: Diagnosis present

## 2024-04-15 DIAGNOSIS — G8929 Other chronic pain: Secondary | ICD-10-CM | POA: Diagnosis present

## 2024-04-15 DIAGNOSIS — E1151 Type 2 diabetes mellitus with diabetic peripheral angiopathy without gangrene: Secondary | ICD-10-CM | POA: Diagnosis present

## 2024-04-15 DIAGNOSIS — R7303 Prediabetes: Secondary | ICD-10-CM | POA: Diagnosis present

## 2024-04-15 DIAGNOSIS — Z79899 Other long term (current) drug therapy: Secondary | ICD-10-CM

## 2024-04-15 DIAGNOSIS — Z5982 Transportation insecurity: Secondary | ICD-10-CM

## 2024-04-15 DIAGNOSIS — Z5900 Homelessness unspecified: Secondary | ICD-10-CM

## 2024-04-15 DIAGNOSIS — I82533 Chronic embolism and thrombosis of popliteal vein, bilateral: Secondary | ICD-10-CM | POA: Diagnosis present

## 2024-04-15 DIAGNOSIS — F17210 Nicotine dependence, cigarettes, uncomplicated: Secondary | ICD-10-CM | POA: Diagnosis present

## 2024-04-15 DIAGNOSIS — K279 Peptic ulcer, site unspecified, unspecified as acute or chronic, without hemorrhage or perforation: Secondary | ICD-10-CM | POA: Diagnosis present

## 2024-04-15 DIAGNOSIS — Z7901 Long term (current) use of anticoagulants: Secondary | ICD-10-CM

## 2024-04-15 DIAGNOSIS — Z8711 Personal history of peptic ulcer disease: Secondary | ICD-10-CM

## 2024-04-15 DIAGNOSIS — F05 Delirium due to known physiological condition: Secondary | ICD-10-CM | POA: Diagnosis not present

## 2024-04-15 DIAGNOSIS — J9819 Other pulmonary collapse: Secondary | ICD-10-CM | POA: Diagnosis present

## 2024-04-15 DIAGNOSIS — Z720 Tobacco use: Secondary | ICD-10-CM | POA: Diagnosis present

## 2024-04-15 DIAGNOSIS — Z5941 Food insecurity: Secondary | ICD-10-CM

## 2024-04-15 DIAGNOSIS — R911 Solitary pulmonary nodule: Secondary | ICD-10-CM | POA: Diagnosis present

## 2024-04-15 DIAGNOSIS — I82453 Acute embolism and thrombosis of peroneal vein, bilateral: Secondary | ICD-10-CM | POA: Diagnosis present

## 2024-04-15 DIAGNOSIS — Z6824 Body mass index (BMI) 24.0-24.9, adult: Secondary | ICD-10-CM

## 2024-04-15 DIAGNOSIS — J9601 Acute respiratory failure with hypoxia: Secondary | ICD-10-CM | POA: Diagnosis present

## 2024-04-15 DIAGNOSIS — E876 Hypokalemia: Secondary | ICD-10-CM | POA: Diagnosis present

## 2024-04-15 DIAGNOSIS — E8809 Other disorders of plasma-protein metabolism, not elsewhere classified: Secondary | ICD-10-CM | POA: Diagnosis present

## 2024-04-15 DIAGNOSIS — Z515 Encounter for palliative care: Secondary | ICD-10-CM

## 2024-04-15 DIAGNOSIS — M549 Dorsalgia, unspecified: Secondary | ICD-10-CM | POA: Diagnosis not present

## 2024-04-15 DIAGNOSIS — L4050 Arthropathic psoriasis, unspecified: Secondary | ICD-10-CM | POA: Diagnosis present

## 2024-04-15 DIAGNOSIS — M25519 Pain in unspecified shoulder: Secondary | ICD-10-CM | POA: Diagnosis not present

## 2024-04-15 DIAGNOSIS — Z599 Problem related to housing and economic circumstances, unspecified: Secondary | ICD-10-CM

## 2024-04-15 DIAGNOSIS — Z5948 Other specified lack of adequate food: Secondary | ICD-10-CM

## 2024-04-15 DIAGNOSIS — M4802 Spinal stenosis, cervical region: Secondary | ICD-10-CM | POA: Diagnosis present

## 2024-04-15 DIAGNOSIS — J1561 Pneumonia due to Acinetobacter baumannii: Secondary | ICD-10-CM | POA: Diagnosis present

## 2024-04-15 DIAGNOSIS — E441 Mild protein-calorie malnutrition: Secondary | ICD-10-CM | POA: Diagnosis present

## 2024-04-15 DIAGNOSIS — I429 Cardiomyopathy, unspecified: Secondary | ICD-10-CM | POA: Diagnosis present

## 2024-04-15 HISTORY — DX: Tobacco use: Z72.0

## 2024-04-15 HISTORY — DX: Aneurysm of the ascending aorta, without rupture: I71.21

## 2024-04-15 HISTORY — DX: Other disorders of lung: J98.4

## 2024-04-15 HISTORY — DX: Homelessness unspecified: Z59.00

## 2024-04-15 HISTORY — DX: Thrombocytopenia, unspecified: D69.6

## 2024-04-15 HISTORY — DX: Anemia, unspecified: D64.9

## 2024-04-15 HISTORY — DX: Unspecified cirrhosis of liver: K74.60

## 2024-04-15 LAB — BASIC METABOLIC PANEL WITH GFR
Anion gap: 15 (ref 5–15)
BUN: 9 mg/dL (ref 8–23)
CO2: 16 mmol/L — ABNORMAL LOW (ref 22–32)
Calcium: 8.5 mg/dL — ABNORMAL LOW (ref 8.9–10.3)
Chloride: 81 mmol/L — ABNORMAL LOW (ref 98–111)
Creatinine, Ser: 0.52 mg/dL — ABNORMAL LOW (ref 0.61–1.24)
GFR, Estimated: >60 mL/min (ref >60)
Glucose, Bld: 116 mg/dL — ABNORMAL HIGH (ref 70–99)
Potassium: 4.3 mmol/L (ref 3.5–5.1)
Sodium: 112 mmol/L — CL (ref 135–145)

## 2024-04-15 LAB — CBC WITH DIFFERENTIAL/PLATELET
Abs Immature Granulocytes: 0.18 K/uL — ABNORMAL HIGH (ref 0.00–0.07)
Basophils Absolute: 0 K/uL (ref 0.0–0.1)
Basophils Relative: 0 %
Eosinophils Absolute: 0 K/uL (ref 0.0–0.5)
Eosinophils Relative: 0 %
HCT: 33.9 % — ABNORMAL LOW (ref 39.0–52.0)
Hemoglobin: 12.1 g/dL — ABNORMAL LOW (ref 13.0–17.0)
Immature Granulocytes: 1 %
Lymphocytes Relative: 9 %
Lymphs Abs: 1.4 K/uL (ref 0.7–4.0)
MCH: 33.3 pg (ref 26.0–34.0)
MCHC: 35.7 g/dL (ref 30.0–36.0)
MCV: 93.4 fL (ref 80.0–100.0)
Monocytes Absolute: 0.9 K/uL (ref 0.1–1.0)
Monocytes Relative: 6 %
Neutro Abs: 12.7 K/uL — ABNORMAL HIGH (ref 1.7–7.7)
Neutrophils Relative %: 84 %
Platelets: 277 K/uL (ref 150–400)
RBC: 3.63 MIL/uL — ABNORMAL LOW (ref 4.22–5.81)
RDW: 12.6 % (ref 11.5–15.5)
WBC: 15.2 K/uL — ABNORMAL HIGH (ref 4.0–10.5)
nRBC: 0 % (ref 0.0–0.2)

## 2024-04-15 LAB — CBG MONITORING, ED
Glucose-Capillary: 15 mg/dL — CL (ref 70–99)
Glucose-Capillary: 160 mg/dL — ABNORMAL HIGH (ref 70–99)

## 2024-04-15 LAB — I-STAT CHEM 8, ED
BUN: 10 mg/dL (ref 8–23)
Calcium, Ion: 0.97 mmol/L — ABNORMAL LOW (ref 1.15–1.40)
Chloride: 80 mmol/L — ABNORMAL LOW (ref 98–111)
Creatinine, Ser: 0.9 mg/dL (ref 0.61–1.24)
Glucose, Bld: 115 mg/dL — ABNORMAL HIGH (ref 70–99)
HCT: 39 % (ref 39.0–52.0)
Hemoglobin: 13.3 g/dL (ref 13.0–17.0)
Potassium: 4.6 mmol/L (ref 3.5–5.1)
Sodium: 113 mmol/L — CL (ref 135–145)
TCO2: 20 mmol/L — ABNORMAL LOW (ref 22–32)

## 2024-04-15 MED ORDER — DEXTROSE 50 % IV SOLN
1.0000 | Freq: Once | INTRAVENOUS | Status: AC
  Administered 2024-04-15: 50 mL via INTRAVENOUS

## 2024-04-15 NOTE — ED Provider Notes (Signed)
 [place] EMERGENCY DEPARTMENT AT [facility] Provider Note   CSN: [id number] Arrival date & time: 04/15/24  2024     Patient presents with: Hypoglycemia and Alcohol Intoxication   Cody Glenn is a 68 y.o. male.  {Add pertinent medical, surgical, social history, OB history to YEP:67052} The history is provided by the patient and medical records.  Hypoglycemia Alcohol Intoxication   68 year old male with history of arthritis, tobacco abuse, homelessness, presenting to the ED after being found by bystander in a local parking lot.  Was hypoglycemic with EMS so was given an amp of D50.  Patient states he does not have any history of diabetes.  He does admit to chronic alcoholism, drinks beer on a daily basis-- estimates more than 12 but less than 24.  Prior to Admission medications   Medication Sig Start Date End Date Taking? Authorizing Provider  folic acid  (FOLVITE ) 1 MG tablet Take 1 tablet (1 mg total) by mouth daily. 02/28/24   Lue Elsie BROCKS, MD  thiamine  (VITAMIN B1) 100 MG tablet Take 1 tablet (100 mg total) by mouth daily. 02/28/24   Lue Elsie BROCKS, MD    Allergies: Patient has no known allergies.    Review of Systems  Constitutional:        Alcohol intoxication  All other systems reviewed and are negative.   Updated Vital Signs BP 136/80   Pulse 85   Temp (!) 97.3 F (36.3 C)   Resp 18   Ht 5' 5 (1.651 m)   Wt 68 kg   SpO2 94%   BMI 24.96 kg/m   Physical Exam Vitals and nursing note reviewed.  Constitutional:      Appearance: He is well-developed.     Comments: Smells of body odor  HENT:     Head: Normocephalic and atraumatic.  Eyes:     Conjunctiva/sclera: Conjunctivae normal.     Pupils: Pupils are equal, round, and reactive to light.  Cardiovascular:     Rate and Rhythm: Normal rate and regular rhythm.     Heart sounds: Normal heart sounds.  Pulmonary:     Effort: Pulmonary effort is normal.     Breath sounds: Normal  breath sounds.  Abdominal:     General: Bowel sounds are normal.     Palpations: Abdomen is soft.  Musculoskeletal:        General: Normal range of motion.     Cervical back: Normal range of motion.  Skin:    General: Skin is warm and dry.  Neurological:     Mental Status: He is alert.     Comments: Appears intoxicated but able to answer questions, moving extremities well     (all labs ordered are listed, but only abnormal results are displayed) Labs Reviewed  BASIC METABOLIC PANEL WITH GFR - Abnormal; Notable for the following components:      Result Value   Sodium 112 (*)    Chloride 81 (*)    CO2 16 (*)    Glucose, Bld 116 (*)    Creatinine, Ser 0.52 (*)    Calcium  8.5 (*)    All other components within normal limits  CBC WITH DIFFERENTIAL/PLATELET - Abnormal; Notable for the following components:   WBC 15.2 (*)    RBC 3.63 (*)    Hemoglobin 12.1 (*)    HCT 33.9 (*)    Neutro Abs 12.7 (*)    Abs Immature Granulocytes 0.18 (*)    All other components within  normal limits  CBG MONITORING, ED - Abnormal; Notable for the following components:   Glucose-Capillary 15 (*)    All other components within normal limits  CBG MONITORING, ED - Abnormal; Notable for the following components:   Glucose-Capillary 160 (*)    All other components within normal limits  I-STAT CHEM 8, ED - Abnormal; Notable for the following components:   Sodium 113 (*)    Chloride 80 (*)    Glucose, Bld 115 (*)    Calcium , Ion 0.97 (*)    TCO2 20 (*)    All other components within normal limits  ETHANOL  OSMOLALITY  SODIUM, URINE, RANDOM  OSMOLALITY, URINE  MAGNESIUM     EKG: None  Radiology: No results found.  {Document cardiac monitor, telemetry assessment procedure when appropriate:32947} Procedures   CRITICAL CARE Performed by: Olam CHRISTELLA Slocumb   Total critical care time: 45 minutes  Critical care time was exclusive of separately billable procedures and treating other  patients.  Critical care was necessary to treat or prevent imminent or life-threatening deterioration.  Critical care was time spent personally by me on the following activities: development of treatment plan with patient and/or surrogate as well as nursing, discussions with consultants, evaluation of patient's response to treatment, examination of patient, obtaining history from patient or surrogate, ordering and performing treatments and interventions, ordering and review of laboratory studies, ordering and review of radiographic studies, pulse oximetry and re-evaluation of patient's condition.   Medications Ordered in the ED  dextrose  50 % solution 50 mL (50 mLs Intravenous Given 04/15/24 2041)      {Click here for ABCD2, HEART and other calculators REFRESH Note before signing:1}                              Medical Decision Making Amount and/or Complexity of Data Reviewed Labs: ordered. ECG/medicine tests: ordered and independent interpretation performed.   67 year old male presenting to the ED after being found down in a parking lot.  He was hypoglycemic and given D50 by EMS.  He is a chronic alcoholic and has been drinking today.  He is awake, alert, oriented here but does appear intoxicated.  Labs were obtained through triage and reviewed--has a mild leukocytosis at 15,000, stable hemoglobin.  Sodium is significantly low at 112.  Baseline is around 128-130 based on prior values.  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  Will add on hepatic panel, serum and urine osmolalities, etc.  He will require admission.  Final diagnoses:  None    ED Discharge Orders     None

## 2024-04-15 NOTE — ED Triage Notes (Signed)
 PT brought in by [facility]. PT was intoxicated and states was found in a parking lot. PT states he drinks alcohol everyday and states he is not a diabetic. PT is alert and talking and at his baseline GCS. No pain noted and no problems stated

## 2024-04-15 NOTE — ED Notes (Signed)
 Pt request something to eat.  Pt given graham crakers and sprite.  km

## 2024-04-16 DIAGNOSIS — A419 Sepsis, unspecified organism: Secondary | ICD-10-CM | POA: Diagnosis not present

## 2024-04-16 DIAGNOSIS — I82413 Acute embolism and thrombosis of femoral vein, bilateral: Secondary | ICD-10-CM | POA: Diagnosis not present

## 2024-04-16 DIAGNOSIS — R64 Cachexia: Secondary | ICD-10-CM | POA: Diagnosis not present

## 2024-04-16 DIAGNOSIS — J984 Other disorders of lung: Secondary | ICD-10-CM | POA: Diagnosis not present

## 2024-04-16 DIAGNOSIS — Y906 Blood alcohol level of 120-199 mg/100 ml: Secondary | ICD-10-CM | POA: Diagnosis present

## 2024-04-16 DIAGNOSIS — E871 Hypo-osmolality and hyponatremia: Secondary | ICD-10-CM

## 2024-04-16 DIAGNOSIS — E441 Mild protein-calorie malnutrition: Secondary | ICD-10-CM | POA: Diagnosis not present

## 2024-04-16 DIAGNOSIS — E43 Unspecified severe protein-calorie malnutrition: Secondary | ICD-10-CM | POA: Diagnosis not present

## 2024-04-16 DIAGNOSIS — D649 Anemia, unspecified: Secondary | ICD-10-CM | POA: Diagnosis not present

## 2024-04-16 DIAGNOSIS — Z7401 Bed confinement status: Secondary | ICD-10-CM | POA: Diagnosis not present

## 2024-04-16 DIAGNOSIS — E872 Acidosis, unspecified: Secondary | ICD-10-CM | POA: Diagnosis not present

## 2024-04-16 DIAGNOSIS — I82443 Acute embolism and thrombosis of tibial vein, bilateral: Secondary | ICD-10-CM | POA: Diagnosis not present

## 2024-04-16 DIAGNOSIS — I429 Cardiomyopathy, unspecified: Secondary | ICD-10-CM | POA: Diagnosis not present

## 2024-04-16 DIAGNOSIS — Z66 Do not resuscitate: Secondary | ICD-10-CM | POA: Diagnosis not present

## 2024-04-16 DIAGNOSIS — R531 Weakness: Secondary | ICD-10-CM | POA: Diagnosis not present

## 2024-04-16 DIAGNOSIS — F1090 Alcohol use, unspecified, uncomplicated: Secondary | ICD-10-CM | POA: Diagnosis not present

## 2024-04-16 DIAGNOSIS — Z515 Encounter for palliative care: Secondary | ICD-10-CM | POA: Diagnosis not present

## 2024-04-16 DIAGNOSIS — A4154 Sepsis due to Acinetobacter baumannii: Secondary | ICD-10-CM | POA: Diagnosis not present

## 2024-04-16 DIAGNOSIS — I5021 Acute systolic (congestive) heart failure: Secondary | ICD-10-CM | POA: Diagnosis not present

## 2024-04-16 DIAGNOSIS — J449 Chronic obstructive pulmonary disease, unspecified: Secondary | ICD-10-CM | POA: Diagnosis not present

## 2024-04-16 DIAGNOSIS — F17200 Nicotine dependence, unspecified, uncomplicated: Secondary | ICD-10-CM | POA: Diagnosis not present

## 2024-04-16 DIAGNOSIS — E46 Unspecified protein-calorie malnutrition: Secondary | ICD-10-CM | POA: Diagnosis not present

## 2024-04-16 DIAGNOSIS — I110 Hypertensive heart disease with heart failure: Secondary | ICD-10-CM | POA: Diagnosis not present

## 2024-04-16 DIAGNOSIS — J439 Emphysema, unspecified: Secondary | ICD-10-CM | POA: Diagnosis not present

## 2024-04-16 DIAGNOSIS — I5043 Acute on chronic combined systolic (congestive) and diastolic (congestive) heart failure: Secondary | ICD-10-CM | POA: Diagnosis not present

## 2024-04-16 DIAGNOSIS — F10929 Alcohol use, unspecified with intoxication, unspecified: Secondary | ICD-10-CM | POA: Diagnosis not present

## 2024-04-16 DIAGNOSIS — E1151 Type 2 diabetes mellitus with diabetic peripheral angiopathy without gangrene: Secondary | ICD-10-CM | POA: Diagnosis not present

## 2024-04-16 DIAGNOSIS — R609 Edema, unspecified: Secondary | ICD-10-CM | POA: Diagnosis not present

## 2024-04-16 DIAGNOSIS — I82453 Acute embolism and thrombosis of peroneal vein, bilateral: Secondary | ICD-10-CM | POA: Diagnosis not present

## 2024-04-16 DIAGNOSIS — F10229 Alcohol dependence with intoxication, unspecified: Secondary | ICD-10-CM | POA: Diagnosis not present

## 2024-04-16 DIAGNOSIS — J90 Pleural effusion, not elsewhere classified: Secondary | ICD-10-CM | POA: Diagnosis not present

## 2024-04-16 DIAGNOSIS — E162 Hypoglycemia, unspecified: Secondary | ICD-10-CM | POA: Diagnosis not present

## 2024-04-16 DIAGNOSIS — J1561 Pneumonia due to Acinetobacter baumannii: Secondary | ICD-10-CM | POA: Diagnosis not present

## 2024-04-16 DIAGNOSIS — R0609 Other forms of dyspnea: Secondary | ICD-10-CM | POA: Diagnosis not present

## 2024-04-16 DIAGNOSIS — I5020 Unspecified systolic (congestive) heart failure: Secondary | ICD-10-CM | POA: Diagnosis not present

## 2024-04-16 DIAGNOSIS — I5033 Acute on chronic diastolic (congestive) heart failure: Secondary | ICD-10-CM | POA: Diagnosis not present

## 2024-04-16 DIAGNOSIS — K7030 Alcoholic cirrhosis of liver without ascites: Secondary | ICD-10-CM | POA: Diagnosis not present

## 2024-04-16 DIAGNOSIS — F1010 Alcohol abuse, uncomplicated: Secondary | ICD-10-CM | POA: Diagnosis not present

## 2024-04-16 DIAGNOSIS — W19XXXA Unspecified fall, initial encounter: Secondary | ICD-10-CM | POA: Diagnosis present

## 2024-04-16 DIAGNOSIS — I7121 Aneurysm of the ascending aorta, without rupture: Secondary | ICD-10-CM | POA: Diagnosis not present

## 2024-04-16 DIAGNOSIS — Z7189 Other specified counseling: Secondary | ICD-10-CM | POA: Diagnosis not present

## 2024-04-16 DIAGNOSIS — J189 Pneumonia, unspecified organism: Secondary | ICD-10-CM | POA: Diagnosis not present

## 2024-04-16 DIAGNOSIS — F05 Delirium due to known physiological condition: Secondary | ICD-10-CM | POA: Diagnosis not present

## 2024-04-16 DIAGNOSIS — R911 Solitary pulmonary nodule: Secondary | ICD-10-CM | POA: Diagnosis not present

## 2024-04-16 DIAGNOSIS — I4581 Long QT syndrome: Secondary | ICD-10-CM | POA: Diagnosis not present

## 2024-04-16 DIAGNOSIS — J440 Chronic obstructive pulmonary disease with acute lower respiratory infection: Secondary | ICD-10-CM | POA: Diagnosis not present

## 2024-04-16 DIAGNOSIS — F1020 Alcohol dependence, uncomplicated: Secondary | ICD-10-CM | POA: Diagnosis not present

## 2024-04-16 DIAGNOSIS — J9601 Acute respiratory failure with hypoxia: Secondary | ICD-10-CM | POA: Diagnosis not present

## 2024-04-16 DIAGNOSIS — I82533 Chronic embolism and thrombosis of popliteal vein, bilateral: Secondary | ICD-10-CM | POA: Diagnosis not present

## 2024-04-16 DIAGNOSIS — L4050 Arthropathic psoriasis, unspecified: Secondary | ICD-10-CM | POA: Diagnosis not present

## 2024-04-16 LAB — HEMOGLOBIN A1C
Hgb A1c MFr Bld: 4.1 % — ABNORMAL LOW (ref 4.8–5.6)
Mean Plasma Glucose: 70.97 mg/dL

## 2024-04-16 LAB — COMPREHENSIVE METABOLIC PANEL WITH GFR
ALT: 16 U/L (ref 0–44)
AST: 35 U/L (ref 15–41)
Albumin: 2 g/dL — ABNORMAL LOW (ref 3.5–5.0)
Alkaline Phosphatase: 66 U/L (ref 38–126)
Anion gap: 13 (ref 5–15)
BUN: 7 mg/dL — ABNORMAL LOW (ref 8–23)
CO2: 20 mmol/L — ABNORMAL LOW (ref 22–32)
Calcium: 8 mg/dL — ABNORMAL LOW (ref 8.9–10.3)
Chloride: 82 mmol/L — ABNORMAL LOW (ref 98–111)
Creatinine, Ser: 0.44 mg/dL — ABNORMAL LOW (ref 0.61–1.24)
GFR, Estimated: >60 mL/min (ref >60)
Glucose, Bld: 88 mg/dL (ref 70–99)
Potassium: 3.7 mmol/L (ref 3.5–5.1)
Sodium: 115 mmol/L — CL (ref 135–145)
Total Bilirubin: 1 mg/dL (ref 0.0–1.2)
Total Protein: 5.4 g/dL — ABNORMAL LOW (ref 6.5–8.1)

## 2024-04-16 LAB — GLUCOSE, CAPILLARY
Glucose-Capillary: 106 mg/dL — ABNORMAL HIGH (ref 70–99)
Glucose-Capillary: 120 mg/dL — ABNORMAL HIGH (ref 70–99)
Glucose-Capillary: 157 mg/dL — ABNORMAL HIGH (ref 70–99)

## 2024-04-16 LAB — CBC
HCT: 26 % — ABNORMAL LOW (ref 39.0–52.0)
Hemoglobin: 9.5 g/dL — ABNORMAL LOW (ref 13.0–17.0)
MCH: 33.6 pg (ref 26.0–34.0)
MCHC: 36.5 g/dL — ABNORMAL HIGH (ref 30.0–36.0)
MCV: 91.9 fL (ref 80.0–100.0)
Platelets: 261 K/uL (ref 150–400)
RBC: 2.83 MIL/uL — ABNORMAL LOW (ref 4.22–5.81)
RDW: 12.7 % (ref 11.5–15.5)
WBC: 10.3 K/uL (ref 4.0–10.5)
nRBC: 0 % (ref 0.0–0.2)

## 2024-04-16 LAB — BASIC METABOLIC PANEL WITH GFR
Anion gap: 9 (ref 5–15)
Anion gap: 12 (ref 5–15)
BUN: 6 mg/dL — ABNORMAL LOW (ref 8–23)
BUN: 8 mg/dL (ref 8–23)
CO2: 22 mmol/L (ref 22–32)
CO2: 20 mmol/L — ABNORMAL LOW (ref 22–32)
Calcium: 8.2 mg/dL — ABNORMAL LOW (ref 8.9–10.3)
Calcium: 7.6 mg/dL — ABNORMAL LOW (ref 8.9–10.3)
Chloride: 89 mmol/L — ABNORMAL LOW (ref 98–111)
Chloride: 87 mmol/L — ABNORMAL LOW (ref 98–111)
Creatinine, Ser: 0.57 mg/dL — ABNORMAL LOW (ref 0.61–1.24)
Creatinine, Ser: 0.56 mg/dL — ABNORMAL LOW (ref 0.61–1.24)
GFR, Estimated: >60 mL/min (ref >60)
GFR, Estimated: >60 mL/min (ref >60)
Glucose, Bld: 93 mg/dL (ref 70–99)
Glucose, Bld: 127 mg/dL — ABNORMAL HIGH (ref 70–99)
Potassium: 4.1 mmol/L (ref 3.5–5.1)
Potassium: 3.4 mmol/L — ABNORMAL LOW (ref 3.5–5.1)
Sodium: 120 mmol/L — ABNORMAL LOW (ref 135–145)
Sodium: 119 mmol/L — CL (ref 135–145)

## 2024-04-16 LAB — HEPATIC FUNCTION PANEL
ALT: 18 U/L (ref 0–44)
AST: 44 U/L — ABNORMAL HIGH (ref 15–41)
Albumin: 2.2 g/dL — ABNORMAL LOW (ref 3.5–5.0)
Alkaline Phosphatase: 73 U/L (ref 38–126)
Bilirubin, Direct: 0.6 mg/dL — ABNORMAL HIGH (ref 0.0–0.2)
Indirect Bilirubin: 0.9 mg/dL (ref 0.3–0.9)
Total Bilirubin: 1.5 mg/dL — ABNORMAL HIGH (ref 0.0–1.2)
Total Protein: 6.1 g/dL — ABNORMAL LOW (ref 6.5–8.1)

## 2024-04-16 LAB — OSMOLALITY
Osmolality: 265 mosm/kg — ABNORMAL LOW (ref 275–295)
Osmolality: 282 mosm/kg (ref 275–295)

## 2024-04-16 LAB — SODIUM
Sodium: 114 mmol/L — CL (ref 135–145)
Sodium: 115 mmol/L — CL (ref 135–145)
Sodium: 118 mmol/L — CL (ref 135–145)

## 2024-04-16 LAB — MAGNESIUM
Magnesium: 1.5 mg/dL — ABNORMAL LOW (ref 1.7–2.4)
Magnesium: 3.2 mg/dL — ABNORMAL HIGH (ref 1.7–2.4)

## 2024-04-16 LAB — ETHANOL: Alcohol, Ethyl (B): 177 mg/dL — ABNORMAL HIGH (ref <15)

## 2024-04-16 LAB — TSH: TSH: 0.88 u[IU]/mL (ref 0.350–4.500)

## 2024-04-16 LAB — CBG MONITORING, ED: Glucose-Capillary: 114 mg/dL — ABNORMAL HIGH (ref 70–99)

## 2024-04-16 LAB — PHOSPHORUS: Phosphorus: 2.5 mg/dL (ref 2.5–4.6)

## 2024-04-16 LAB — T4, FREE: Free T4: 1.34 ng/dL — ABNORMAL HIGH (ref 0.61–1.12)

## 2024-04-16 MED ORDER — ONDANSETRON HCL 4 MG PO TABS: 4.0000 mg | ORAL_TABLET | Freq: Four times a day (QID) | ORAL | Status: DC | PRN

## 2024-04-16 MED ORDER — SODIUM CHLORIDE 1 G PO TABS
2.0000 g | ORAL_TABLET | Freq: Three times a day (TID) | ORAL | Status: DC
  Administered 2024-04-16: 2 g via ORAL
  Filled 2024-04-16 (×2): qty 2

## 2024-04-16 MED ORDER — NICOTINE 14 MG/24HR TD PT24
14.0000 mg | MEDICATED_PATCH | Freq: Every day | TRANSDERMAL | Status: DC
  Administered 2024-04-16 – 2024-05-02 (×17): 14 mg via TRANSDERMAL
  Filled 2024-04-16 (×17): qty 1

## 2024-04-16 MED ORDER — NICOTINE POLACRILEX 2 MG MT GUM: 2.0000 mg | CHEWING_GUM | OROMUCOSAL | Status: DC | PRN

## 2024-04-16 MED ORDER — LORAZEPAM 2 MG/ML IJ SOLN: 0.0000 mg | Freq: Four times a day (QID) | INTRAMUSCULAR | Status: DC

## 2024-04-16 MED ORDER — CHLORDIAZEPOXIDE HCL 25 MG PO CAPS: 25.0000 mg | ORAL_CAPSULE | Freq: Three times a day (TID) | ORAL | Status: DC

## 2024-04-16 MED ORDER — MAGNESIUM SULFATE 2 GM/50ML IV SOLN
2.0000 g | Freq: Once | INTRAVENOUS | Status: AC
  Administered 2024-04-16: 2 g via INTRAVENOUS
  Filled 2024-04-16: qty 50

## 2024-04-16 MED ORDER — POLYETHYLENE GLYCOL 3350 17 G PO PACK: 17.0000 g | PACK | Freq: Every day | ORAL | Status: DC | PRN

## 2024-04-16 MED ORDER — LORAZEPAM 2 MG/ML IJ SOLN: 1.0000 mg | INTRAMUSCULAR | Status: AC | PRN

## 2024-04-16 MED ORDER — SODIUM CHLORIDE 1 G PO TABS
1.0000 g | ORAL_TABLET | Freq: Three times a day (TID) | ORAL | Status: DC
  Administered 2024-04-16 – 2024-04-26 (×29): 1 g via ORAL
  Filled 2024-04-16 (×30): qty 1

## 2024-04-16 MED ORDER — LIDOCAINE 5 % EX PTCH
1.0000 | MEDICATED_PATCH | CUTANEOUS | Status: DC
  Administered 2024-04-16 – 2024-05-02 (×7): 1 via TRANSDERMAL
  Filled 2024-04-16 (×13): qty 1

## 2024-04-16 MED ORDER — ENSURE PLUS HIGH PROTEIN PO LIQD
237.0000 mL | Freq: Three times a day (TID) | ORAL | Status: DC
  Filled 2024-04-16: qty 237

## 2024-04-16 MED ORDER — SODIUM CHLORIDE 0.9 % IV SOLN: INTRAVENOUS | Status: AC

## 2024-04-16 MED ORDER — ALBUTEROL SULFATE (2.5 MG/3ML) 0.083% IN NEBU: 2.5000 mg | INHALATION_SOLUTION | RESPIRATORY_TRACT | Status: DC | PRN

## 2024-04-16 MED ORDER — LORAZEPAM 1 MG PO TABS: 1.0000 mg | ORAL_TABLET | ORAL | Status: AC | PRN

## 2024-04-16 MED ORDER — ONDANSETRON HCL 4 MG/2ML IJ SOLN: 4.0000 mg | Freq: Four times a day (QID) | INTRAMUSCULAR | Status: DC | PRN

## 2024-04-16 MED ORDER — CHLORDIAZEPOXIDE HCL 25 MG PO CAPS
25.0000 mg | ORAL_CAPSULE | Freq: Four times a day (QID) | ORAL | Status: DC
  Administered 2024-04-16 (×3): 25 mg via ORAL
  Filled 2024-04-16 (×3): qty 1

## 2024-04-16 MED ORDER — PANTOPRAZOLE SODIUM 40 MG PO TBEC
40.0000 mg | DELAYED_RELEASE_TABLET | Freq: Every day | ORAL | Status: DC
  Administered 2024-04-16 – 2024-05-02 (×17): 40 mg via ORAL
  Filled 2024-04-16 (×17): qty 1

## 2024-04-16 MED ORDER — THIAMINE MONONITRATE 100 MG PO TABS
100.0000 mg | ORAL_TABLET | Freq: Every day | ORAL | Status: DC
  Administered 2024-04-16 – 2024-04-25 (×9): 100 mg via ORAL
  Filled 2024-04-16 (×9): qty 1

## 2024-04-16 MED ORDER — ENSURE PLUS HIGH PROTEIN PO LIQD
237.0000 mL | Freq: Two times a day (BID) | ORAL | Status: DC
  Administered 2024-04-16 – 2024-04-17 (×2): 237 mL via ORAL
  Filled 2024-04-16: qty 237

## 2024-04-16 MED ORDER — ADULT MULTIVITAMIN W/MINERALS CH
1.0000 | ORAL_TABLET | Freq: Every day | ORAL | Status: DC
  Administered 2024-04-16 – 2024-05-02 (×17): 1 via ORAL
  Filled 2024-04-16 (×17): qty 1

## 2024-04-16 MED ORDER — FOLIC ACID 1 MG PO TABS
1.0000 mg | ORAL_TABLET | Freq: Every day | ORAL | Status: DC
  Administered 2024-04-16 – 2024-05-02 (×17): 1 mg via ORAL
  Filled 2024-04-16 (×18): qty 1

## 2024-04-16 MED ORDER — ENOXAPARIN SODIUM 40 MG/0.4ML IJ SOSY
40.0000 mg | PREFILLED_SYRINGE | Freq: Every day | INTRAMUSCULAR | Status: DC
  Administered 2024-04-16: 40 mg via SUBCUTANEOUS
  Filled 2024-04-16: qty 0.4

## 2024-04-16 MED ORDER — THIAMINE HCL 100 MG/ML IJ SOLN
100.0000 mg | Freq: Every day | INTRAMUSCULAR | Status: DC
  Administered 2024-04-19: 100 mg via INTRAVENOUS
  Filled 2024-04-16 (×3): qty 2

## 2024-04-16 MED ORDER — CHLORDIAZEPOXIDE HCL 25 MG PO CAPS: 25.0000 mg | ORAL_CAPSULE | Freq: Every day | ORAL | Status: DC

## 2024-04-16 MED ORDER — CALCIUM GLUCONATE-NACL 1-0.675 GM/50ML-% IV SOLN
1.0000 g | Freq: Once | INTRAVENOUS | Status: AC
  Administered 2024-04-16: 1000 mg via INTRAVENOUS
  Filled 2024-04-16: qty 50

## 2024-04-16 MED ORDER — LORAZEPAM 2 MG/ML IJ SOLN: 0.0000 mg | Freq: Two times a day (BID) | INTRAMUSCULAR | Status: DC

## 2024-04-16 MED ORDER — ACETAMINOPHEN 325 MG PO TABS
650.0000 mg | ORAL_TABLET | Freq: Four times a day (QID) | ORAL | Status: DC | PRN
  Administered 2024-04-16 – 2024-05-02 (×5): 650 mg via ORAL
  Filled 2024-04-16 (×5): qty 2

## 2024-04-16 MED ORDER — ACETAMINOPHEN 650 MG RE SUPP: 650.0000 mg | Freq: Four times a day (QID) | RECTAL | Status: DC | PRN

## 2024-04-16 MED ORDER — CHLORDIAZEPOXIDE HCL 25 MG PO CAPS: 25.0000 mg | ORAL_CAPSULE | ORAL | Status: DC

## 2024-04-16 MED ORDER — KETOROLAC TROMETHAMINE 15 MG/ML IJ SOLN
15.0000 mg | Freq: Four times a day (QID) | INTRAMUSCULAR | Status: AC | PRN
  Administered 2024-04-17: 15 mg via INTRAVENOUS
  Filled 2024-04-16: qty 1

## 2024-04-16 MED ORDER — MELATONIN 5 MG PO TABS
5.0000 mg | ORAL_TABLET | Freq: Every evening | ORAL | Status: DC | PRN
  Administered 2024-04-17 – 2024-05-01 (×6): 5 mg via ORAL
  Filled 2024-04-16 (×8): qty 1

## 2024-04-16 NOTE — ED Notes (Addendum)
 Pt unable to void at this time.  Pt given turkey sandwich (bag) and coke.  KM

## 2024-04-16 NOTE — Discharge Instructions (Addendum)
 Outpatient Substance Abuse  Treatment- uninsured  Narcotics Anonymous 24-HOUR HELPLINE Pre-recorded for Meeting Schedules [facility] [phone]  [email or web address] ALCOHOLICS ANONYMOUS  [place]   Answering Service [phone] Please Note: All [place] Meetings are Non-smoking [email or web address]  Alcohol and Drug Services -  Insurance: Medicaid /State funding/private insurance Methadone, suboxone/Intensive outpatient     [phone] Fax: [phone] [address] [place], [place], [address] [place] [phone] Fax: [phone]    [address] [place], [place], [address] ([address] [place], [place], [place], [place], [place], [place], [place], [place]) [facility] [email or web address] Accepts State funding/Medicaid Transitional housing, Intensive Outpatient Treatment, Outpatient treatment, Veterans Services  Phone: [phone] Fax: [phone] Address: [address] [place] [address]   [employer] [facility] ([email or web address].[email or web address]/) Insurance: Medicaid Case Management, Administrator, arts, Medication Management, Outpatient Therapy, Psychosocial Rehabilitation, Substance Abuse Intensive Outpatient  Phone: [phone] Fax: [phone] [address], [place], [place], [address]  [facility] Medicaid, most private insurance providers Types of Program: Individual/Group Therapy, Substance Abuse Treatment  Phone: [place] [phone] Fax: [phone] [address], [place], [place], [address] [place] [phone] [address], [address] [place], [place], [address]  [facility]  Medicaid Types of Program: SAIOP  Phone: [phone] Fax: [phone] [address] [place], [place], [place], [address] [facility] Medicaid/state funds Crisis line [phone] [facility] [phone] [place] [phone]  [place] [phone]  [facility] [address];  [place], [place] [address] [phone]  Substance Abuse Intensive Outpatient Program [facility] [address] [place], [place] [address] [phone]- Substance abuse treatment  Successful Transitions  Insurance: [facility], [address], sliding scale Types of Program: substance abuse treatment, transportation assistance Phone: [phone] Fax: [phone] Address: [address], [place] [address] [facility] ([email or web address]) Insurance: UHC, [place], [place], [place] Program: addiction counseling, detoxification,  Phone: [phone]  Fax: [phone] Address: [address] [place], [place] [address]  [facility] (statewide facilities/programs) [address] (Medicaid/state funds) [place], [place] [address]                      http://barrett.[email or web address] [phone] Daniel mcalpine- [phone] [place]- [phone] [facility] [place] (2 Locations) (Medicaid/state funds) --[address]  walk in 8:30-12 and 1-2:30 [place], [id number]   [facility]- [phone] --[address] [place], [place] [address]  [phone]-[phone] walk in 8:30-12 and 2-3:30  [facility] state funds/medicaid [address] [place], [place] [address] [phone] [facility] (Suboxone clinic) Medicaid/state funds  [address]  [place], [place] [address] [phone]   [facility]  [address], [place], [place] [address]  [phone] (24 hours) [place]- [address] [place], [place] [address]  [phone] (24 hours) [place]- [address] [place] [phone] - [address] [place] [phone] [place] [address] [place] [phone] [facility]- Medicaid and state funds  [place]- [address] [place], [place] [address] [phone] (24 hours) [place]- [address] [place],  [place] [address] [phone] [facility]- [address] [place], [place] [address] [phone] (24 hours) [place] [address]  [address] [place], [place]  [address] [phone] [place]- [address] [place] [phone]   Information on my medicine - ELIQUIS  (apixaban )  Why was Eliquis  prescribed for you? Eliquis  was prescribed to treat blood clots that may have been found in the veins of your legs (deep vein thrombosis) or in your lungs (pulmonary embolism) and to reduce the risk of them occurring again.  What do You need to know about Eliquis  ? The starting dose is 10 mg (two 5 mg tablets) taken TWICE daily for the FIRST SEVEN (7) DAYS, then on October 1 the dose is reduced to ONE 5 mg tablet taken TWICE daily.  Eliquis  may be taken with or without food.   Try to take the dose about the same time in the morning and in the evening. If you have difficulty swallowing the tablet whole please discuss with your pharmacist how to take the medication safely.  Take Eliquis  exactly as prescribed and DO NOT stop taking Eliquis  without talking to the doctor who prescribed the medication.  Stopping may increase your risk of developing a new blood clot.  Refill your prescription before you run out.  After discharge, you should have regular check-up appointments with your healthcare provider that is prescribing your Eliquis .    What do you do if you miss a dose? If a dose of ELIQUIS  is not taken at the scheduled time, take it as soon as possible on the same day and twice-daily administration should be resumed. The dose should not be doubled to make up for a missed dose.  Important Safety Information A possible side effect of Eliquis  is bleeding. You should call your healthcare provider right away if you experience any of the following: Bleeding from an injury or your nose that does not stop. Unusual colored urine (red or dark brown) or unusual colored stools (red or black). Unusual bruising for  unknown reasons. A serious fall or if you hit your head (even if there is no bleeding).  Some medicines may interact with Eliquis  and might increase your risk of bleeding or clotting while on Eliquis . To help avoid this, consult your healthcare provider or pharmacist prior to using any new prescription or non-prescription medications, including herbals, vitamins, non-steroidal anti-inflammatory drugs (NSAIDs) and supplements.  This website has more information on Eliquis  (apixaban ): [email or web address] [email or web address] [email or web address]

## 2024-04-16 NOTE — H&P (Signed)
 History and Physical    [name]:[id number] DOB: 07/04/1956 DOA: 04/15/2024  DOS: the patient was seen and examined on 04/15/2024  PCP: [name], MD   Patient coming from: Homeless  I have personally briefly reviewed patient's old medical records in [facility] and CareEverywhere  HPI:  Cody Glenn is a 68 y.o. year old male with past medical history of heavy alcohol use, DVT, PUD, history of partial gastrectomy, active smoking, prediabetes, primary hypertension, cervical spinal stenosis, and homelessness.  He presents to [name], ED after being found by a bystander in a parking lot.  On EMS arrival patient was hypoglycemic and was given an amp of D50.  ED Course: On arrival to [facility], ED patient was noted to be afebrile temp 36.6 C, BP 136/80, HR 85, RR 18, SpO2 94% on room air.  Labs notable for sodium 138, ionized calcium  0.97, chloride 80, WBC 15.2, hemoglobin 12.1, alcohol level 177, serum osmolality 282, total protein 6.1, albumin 2.2, AST 44, T. bili 1.5, magnesium  1.5.  He was given 2 g IV magnesium , an amp of D50, started on CIWA protocol with symptom triggered Ativan , and started on normal saline at 75 mL/h. TRH contacted for admission.  Review of Systems: As mentioned in the history of present illness. All other systems reviewed and are negative.  Review of Systems  Constitutional:  Positive for malaise/fatigue. Negative for chills, fever and weight loss.  HENT:  Negative for congestion, sinus pain and sore throat.   Eyes:  Positive for redness.  Respiratory:  Negative for cough, sputum production, shortness of breath and wheezing.   Cardiovascular:  Negative for chest pain and palpitations.  Gastrointestinal:  Positive for heartburn. Negative for abdominal pain, constipation, diarrhea, nausea and vomiting.  Musculoskeletal:  Positive for neck pain.  Skin:  Positive for itching.  Neurological:  Positive for headaches. Negative for dizziness,  focal weakness and weakness.    Past Medical History:  Diagnosis Date   Alcohol use    Hyperbilirubinemia 07/01/2020   Peptic ulcer 08/09/1987   Jul 28, 2008 Entered By: KATHERINE ETHA HERO Comment: s/p partial gastrectomy    Past Surgical History:  Procedure Laterality Date   FLEXIBLE BRONCHOSCOPY Left 02/21/2024   Procedure: BRONCHOSCOPY, FLEXIBLE;  Surgeon: Shelah Lamar RAMAN, MD;  Location: [facility] ENDOSCOPY;  Service: Cardiopulmonary;  Laterality: Left;   ulcer surgery in stomach     pt states he had this approx 33 years ago     reports that he has been smoking cigarettes. He has a 40 pack-year smoking history. He has never used smokeless tobacco. He reports current alcohol use. He reports that he does not currently use drugs.  No Known Allergies  History reviewed. No pertinent family history.  Prior to Admission medications   Medication Sig Start Date End Date Taking? Authorizing Provider  folic acid  (FOLVITE ) 1 MG tablet Take 1 tablet (1 mg total) by mouth daily. 02/28/24   Lue Elsie BROCKS, MD  thiamine  (VITAMIN B1) 100 MG tablet Take 1 tablet (100 mg total) by mouth daily. 02/28/24   Lue Elsie BROCKS, MD    Physical Exam: Vitals:   04/16/24 0500 04/16/24 0530 04/16/24 0600 04/16/24 0603  BP: 133/79  109/68 109/68  Pulse: (!) 59   (!) 59  Resp: 16  16   Temp:      TempSrc:      SpO2: 90% 100%    Weight:      Height:  Physical Exam Vitals and nursing note reviewed.  HENT:     Head: Normocephalic.     Mouth/Throat:     Mouth: Mucous membranes are dry.     Dentition: Abnormal dentition.  Eyes:     Pupils: Pupils are equal, round, and reactive to light.  Cardiovascular:     Rate and Rhythm: Normal rate and regular rhythm.     Pulses: Normal pulses.     Heart sounds: No murmur heard.    No friction rub. No gallop.  Pulmonary:     Effort: Pulmonary effort is normal. No respiratory distress.     Breath sounds: No wheezing, rhonchi or rales.  Abdominal:      General: There is no distension.     Tenderness: There is no abdominal tenderness. There is no guarding.  Musculoskeletal:        General: Normal range of motion.     Cervical back: Pain with movement and spinous process tenderness present.  Skin:    General: Skin is warm and dry.     Capillary Refill: Capillary refill takes less than 2 seconds.  Neurological:     General: No focal deficit present.     Mental Status: He is alert and oriented to person, place, and time.     Labs on Admission: I have personally reviewed following labs and imaging studies  CBC: Recent Labs  Lab 04/15/24 2043 04/15/24 2045  WBC  --  15.2*  NEUTROABS  --  12.7*  HGB 13.3 12.1*  HCT 39.0 33.9*  MCV  --  93.4  PLT  --  277   Basic Metabolic Panel: Recent Labs  Lab 04/15/24 2043 04/15/24 2045 04/16/24 0334 04/16/24 0531  NA 113* 112* 114* 115*  K 4.6 4.3  --   --   CL 80* 81*  --   --   CO2  --  16*  --   --   GLUCOSE 115* 116*  --   --   BUN 10 9  --   --   CREATININE 0.90 0.52*  --   --   CALCIUM   --  8.5*  --   --   MG  --   --  1.5*  --    GFR: Estimated Creatinine Clearance: 77.9 mL/min (A) (by C-G formula based on SCr of 0.52 mg/dL (L)). Liver Function Tests: Recent Labs  Lab 04/16/24 0334  AST 44*  ALT 18  ALKPHOS 73  BILITOT 1.5*  PROT 6.1*  ALBUMIN 2.2*   No results for input(s): LIPASE, AMYLASE in the last 168 hours. No results for input(s): AMMONIA in the last 168 hours. Coagulation Profile: No results for input(s): INR, PROTIME in the last 168 hours. Cardiac Enzymes: No results for input(s): CKTOTAL, CKMB, CKMBINDEX, TROPONINI, TROPONINIHS in the last 168 hours. BNP (last 3 results) Recent Labs    02/20/24 1223  BNP 81.6   HbA1C: No results for input(s): HGBA1C in the last 72 hours. CBG: Recent Labs  Lab 04/15/24 2033 04/15/24 2044  GLUCAP 15* 160*   Lipid Profile: No results for input(s): CHOL, HDL, LDLCALC, TRIG,  CHOLHDL, LDLDIRECT in the last 72 hours. Thyroid  Function Tests: No results for input(s): TSH, T4TOTAL, FREET4, T3FREE, THYROIDAB in the last 72 hours. Anemia Panel: No results for input(s): VITAMINB12, FOLATE, FERRITIN, TIBC, IRON, RETICCTPCT in the last 72 hours. Urine analysis:    Component Value Date/Time   COLORURINE AMBER (A) 06/29/2020 1933   APPEARANCEUR CLEAR 06/29/2020 1933  LABSPEC 1.020 06/29/2020 1933   PHURINE 6.0 06/29/2020 1933   GLUCOSEU NEGATIVE 06/29/2020 1933   HGBUR NEGATIVE 06/29/2020 1933   BILIRUBINUR NEGATIVE 06/29/2020 1933   KETONESUR 20 (A) 06/29/2020 1933   PROTEINUR 100 (A) 06/29/2020 1933   NITRITE NEGATIVE 06/29/2020 1933   LEUKOCYTESUR NEGATIVE 06/29/2020 1933    Radiological Exams on Admission: I have personally reviewed images No results found.  EKG: My personal interpretation of EKG shows: NSR HR 75, without conduction abnormalities    Assessment/Plan Active Problems:   Hyponatremia   Active Medical Issues: ##Beer Potomania ##Hyponatremia Patient has corrected 93mmol/L in 12 hours 113-->115 - 1200 mL Fluid restriction for now - Increase continuous NS to 158mL/hr - Goal correction 4-6 mmol/L in first 24 hours. - Dr Dolan with Nephrology consulted, appreciate his recommendations and management  ##Hypoglycemia #Prediabetes - Patient with history of prediabetes, check A1C - Diet ordered - q4H CBG monitoring  ##Hypomagnesemia ##Hypocalcemia - s/p 2g IV Mg in ED - Ionized calcium  0.97, will give 1g Ca Gluconate x1 now.  ##Protein Calorie Malnutrition ##Hypoalbuminemia - Dietician consulted  #Alcohol use disorder Patient reports drinking 6-8 beers daily, reports last drink was yesterday - Patient counseled on importance of cessation - CIWA protocol with PRN symptom triggered Ativan   #Tobacco Use Disorder - Patient counseled on importance of cessation - Nicotine  replacement therapy with nicotine   patch and gum while inpatient  Chronic Medical Issues: #Normocytic Anemia Stable  #COPD Not on any outpatient maintenance inhalers -PRN Albuterol   #Hypertension - Patient reports he is not taking Diovan  - BP normotensive, monitor  #Peptic Ulcer Disease - Protonix   #Chronic Neck Pain - Lidocaine  patch - PRN toradol   VTE prophylaxis:  Lovenox   GI prophylaxis: Protonix   Diet: Regular with 1200 mL fluid restriction Access: PIV Lines: NONE Code Status:  Full Code Telemetry: Yes  Admission status: Inpatient, Progressive Patient is from: Home  Anticipated d/c is to: Home  Anticipated d/c date is: 3-4 days Patient currently: Pending nephrology consultation and stabilization of electrolyte abnormalities  Family Communication:  No family at bedside, plan of care discussed with patient. Questions welcomed and elicited. Questions answered to patients expressed satisfaction  Consults called: Dr Dolan, nephrology    Severity of Illness: The appropriate patient status for this patient is INPATIENT. Inpatient status is judged to be reasonable and necessary in order to provide the required intensity of service to ensure the patient's safety. The patient's presenting symptoms, physical exam findings, and initial radiographic and laboratory data in the context of their chronic comorbidities is felt to place them at high risk for further clinical deterioration. Furthermore, it is not anticipated that the patient will be medically stable for discharge from the hospital within 2 midnights of admission.   * I certify that at the point of admission it is my clinical judgment that the patient will require inpatient hospital care spanning beyond 2 midnights from the point of admission due to high intensity of service, high risk for further deterioration and high frequency of surveillance required.*  To reach the provider On-Call:   7AM- 7PM see care teams to locate the attending and reach out  to them via [email or web address] Password: [email or web address] 7PM-7AM contact night-coverage If you still have difficulty reaching the appropriate provider, please page the [facility] (Director on Call) for [facility] on amion for assistance  This document was prepared using Conservation officer, historic buildings and may include unintentional dictation errors.  Rockie Rams FNP-BC, PMHNP-BC Nurse Practitioner [facility]

## 2024-04-16 NOTE — ED Notes (Signed)
 CCMD contacted for cardiac monitoring

## 2024-04-16 NOTE — Progress Notes (Signed)
 Sodium level 118. Notified [employer], DO. Continue with NS @ 100 mL/hr and Q 4 hour BMP/s.

## 2024-04-16 NOTE — Progress Notes (Addendum)
 CSW added substance abuse and shelter resources to patient's AVS.  Edwin Dada, MSW, LCSW Transitions of Care  Clinical Social Worker II [phone]

## 2024-04-16 NOTE — ED Notes (Signed)
 Pt was soiled with stool.  Pt changed and meal given

## 2024-04-16 NOTE — Progress Notes (Signed)
 TR[facility] night cross cover note:  Regarding this patient here with acute on chronic hyponatremia, with a suspected contribution, per chart review, from beer drinker's potomania: most recent BMP drawn at 1900 shows sodium level corrected for glucose of approximately 120, stable from more recent prior sodium level of 120 drawn around 1600 today, and showing gradual improvement relative to presenting sodium level of 112 when checked about 24 hours ago at 2100 on 9/8. For now, will continue with existing orders for ns at 100 cc/hr, with order for q4h bmp's noted. Nephrology following.     Eva Pore, DO Hospitalist

## 2024-04-16 NOTE — Consult Note (Signed)
 [facility] Nephrology Consultation Note  Requesting MD: Dr. Tobie, Yetta Reason for consult: Hyponatremia  HPI:  Cody Glenn is a 68 y.o. male with past medical history significant for alcohol abuse, PVD, DVT, hypertension, homelessness, chronic hyponatremia presented with alcohol intoxication, seen as a consultation for the evaluation of hyponatremia. Apparently the patient was brought in by EMS as he was intoxicated and was found in a parking lot.  Reportedly he was hypoglycemic and was given dextrose .  In the ER his blood pressure was acceptable and in room air.  The labs showed sodium level of 113, BUN 10, creatinine 0.9.  Received normal saline with sodium level changed 115 this morning.  Rest of the lab looks serum osmolality 265, urine osmolarity 209, urine sodium less than 10. It seems like he drinks almost every day and probably not eating proper meal. He is currently alert awake and following commands.  Mental status seems to be gradually improving. No nausea, vomiting, chest pain or shortness of breath.  Review of system is limited.  PMHx:   Past Medical History:  Diagnosis Date   Alcohol use    Hyperbilirubinemia 07/01/2020   Peptic ulcer 08/09/1987   Jul 28, 2008 Entered By: KATHERINE ETHA HERO Comment: s/p partial gastrectomy    Past Surgical History:  Procedure Laterality Date   FLEXIBLE BRONCHOSCOPY Left 02/21/2024   Procedure: BRONCHOSCOPY, FLEXIBLE;  Surgeon: Shelah Lamar RAMAN, MD;  Location: [facility];  Service: Cardiopulmonary;  Laterality: Left;   ulcer surgery in stomach     pt states he had this approx 33 years ago    Family Hx: History reviewed. No pertinent family history.  Social History:  reports that he has been smoking cigarettes. He has a 40 pack-year smoking history. He has never used smokeless tobacco. He reports current alcohol use. He reports that he does not currently use drugs.  Allergies: No Known  Allergies  Medications: Prior to Admission medications   Medication Sig Start Date End Date Taking? Authorizing Provider  folic acid  (FOLVITE ) 1 MG tablet Take 1 tablet (1 mg total) by mouth daily. 02/28/24  Yes Lue Elsie BROCKS, MD  thiamine  (VITAMIN B1) 100 MG tablet Take 1 tablet (100 mg total) by mouth daily. 02/28/24  Yes Lue Elsie BROCKS, MD    I have reviewed the patient's current medications.  Labs: Renal Panel: Recent Labs  Lab 04/15/24 2043 04/15/24 2045 04/16/24 0334 04/16/24 0531 04/16/24 0743  NA 113* 112* 114* 115* 115*  K 4.6 4.3  --   --  3.7  CL 80* 81*  --   --  82*  CO2  --  16*  --   --  20*  GLUCOSE 115* 116*  --   --  88  BUN 10 9  --   --  7*  CREATININE 0.90 0.52*  --   --  0.44*  CALCIUM   --  8.5*  --   --  8.0*  MG  --   --  1.5*  --  3.2*  PHOS  --   --   --   --  2.5     CBC:    Latest Ref Rng & Units 04/16/2024    7:43 AM 04/15/2024    8:45 PM 04/15/2024    8:43 PM  CBC  WBC 4.0 - 10.5 K/uL 10.3  15.2    Hemoglobin 13.0 - 17.0 g/dL 9.5  87.8  86.6   Hematocrit 39.0 - 52.0 % 26.0  33.9  39.0   Platelets 150 - 400 K/uL 261  277       Anemia Panel:  Recent Labs    02/20/24 1653 02/21/24 0139 02/24/24 0350 03/18/24 1448 04/15/24 2043 04/15/24 2045 04/16/24 0743  HGB  --    < > 8.3* 10.7* 13.3 12.1* 9.5*  MCV  --    < > 95.0 95.8  --  93.4 91.9  VITAMINB12 414  --   --   --   --   --   --   FOLATE 9.3  --   --   --   --   --   --    < > = values in this interval not displayed.    Recent Labs  Lab 04/16/24 0334 04/16/24 0743  AST 44* 35  ALT 18 16  ALKPHOS 73 66  BILITOT 1.5* 1.0  PROT 6.1* 5.4*  ALBUMIN 2.2* 2.0*    Lab Results  Component Value Date   HGBA1C 5.5 10/13/2020    ROS:  Pertinent items noted in HPI and remainder of comprehensive ROS otherwise negative.  Physical Exam: Vitals:   04/16/24 0930 04/16/24 1000  BP: 118/86 113/71  Pulse: 76 86  Resp: 15 19  Temp:  98.7 F (37.1 C)  SpO2: 91% 100%      General exam: Lying on bed, alert awake and not in distress Respiratory system: Clear to auscultation. Respiratory effort normal. No wheezing or crackle Cardiovascular system: S1 & S2 heard, RRR.  No pedal edema. Gastrointestinal system: Abdomen is nondistended, soft and nontender. Normal bowel sounds heard. Central nervous system: Alert and awake Extremities: Trace chronic peripheral edema Skin: No rashes, lesions or ulcers Psychiatry: Alert and awake.  Assessment/Plan:  # Acute on chronic hyponatremia due to beer potomania/reduced solute intake.  Baseline sodium level seems to be around anywhere between 120-130 however presented with lowest sodium level of 112.  Received NS with improvement of sodium 115 this morning. Agree with NS, salt tablet.  Encourage high-protein diet.  Continue frequent sodium checks.  Mental status seems to be gradually improving, no need for hypertonic saline at this time. Check TSH and a.m. cortisol level as well.  # Alcohol intoxication, watch for alcohol withdrawal: Currently on thiamine , multivitamin and hydration per primary team.  # Protein calorie malnutrition: Needs high-protein diet.  I encouraged him to drink Ensure.  Thank you for the consult, we will continue to follow.   Arrow Emmerich Amelie Romney 04/16/2024, 10:47 AM  [facility].

## 2024-04-17 ENCOUNTER — Inpatient Hospital Stay (HOSPITAL_COMMUNITY)

## 2024-04-17 DIAGNOSIS — A419 Sepsis, unspecified organism: Secondary | ICD-10-CM | POA: Diagnosis not present

## 2024-04-17 DIAGNOSIS — F1010 Alcohol abuse, uncomplicated: Secondary | ICD-10-CM | POA: Diagnosis not present

## 2024-04-17 DIAGNOSIS — E43 Unspecified severe protein-calorie malnutrition: Secondary | ICD-10-CM | POA: Insufficient documentation

## 2024-04-17 DIAGNOSIS — E871 Hypo-osmolality and hyponatremia: Secondary | ICD-10-CM | POA: Diagnosis not present

## 2024-04-17 DIAGNOSIS — J984 Other disorders of lung: Secondary | ICD-10-CM

## 2024-04-17 LAB — COMPREHENSIVE METABOLIC PANEL WITH GFR
ALT: 15 U/L (ref 0–44)
ALT: 17 U/L (ref 0–44)
AST: 35 U/L (ref 15–41)
AST: 32 U/L (ref 15–41)
Albumin: 1.8 g/dL — ABNORMAL LOW (ref 3.5–5.0)
Albumin: 2.2 g/dL — ABNORMAL LOW (ref 3.5–5.0)
Alkaline Phosphatase: 67 U/L (ref 38–126)
Alkaline Phosphatase: 71 U/L (ref 38–126)
Anion gap: 9 (ref 5–15)
Anion gap: 11 (ref 5–15)
BUN: 8 mg/dL (ref 8–23)
BUN: 8 mg/dL (ref 8–23)
CO2: 21 mmol/L — ABNORMAL LOW (ref 22–32)
CO2: 23 mmol/L (ref 22–32)
Calcium: 7.8 mg/dL — ABNORMAL LOW (ref 8.9–10.3)
Calcium: 8.2 mg/dL — ABNORMAL LOW (ref 8.9–10.3)
Chloride: 91 mmol/L — ABNORMAL LOW (ref 98–111)
Chloride: 90 mmol/L — ABNORMAL LOW (ref 98–111)
Creatinine, Ser: 0.59 mg/dL — ABNORMAL LOW (ref 0.61–1.24)
Creatinine, Ser: 0.59 mg/dL — ABNORMAL LOW (ref 0.61–1.24)
GFR, Estimated: >60 mL/min (ref >60)
GFR, Estimated: >60 mL/min (ref >60)
Glucose, Bld: 104 mg/dL — ABNORMAL HIGH (ref 70–99)
Glucose, Bld: 86 mg/dL (ref 70–99)
Potassium: 3.5 mmol/L (ref 3.5–5.1)
Potassium: 3.7 mmol/L (ref 3.5–5.1)
Sodium: 121 mmol/L — ABNORMAL LOW (ref 135–145)
Sodium: 124 mmol/L — ABNORMAL LOW (ref 135–145)
Total Bilirubin: 0.9 mg/dL (ref 0.0–1.2)
Total Bilirubin: 1.2 mg/dL (ref 0.0–1.2)
Total Protein: 4.6 g/dL — ABNORMAL LOW (ref 6.5–8.1)
Total Protein: 5.9 g/dL — ABNORMAL LOW (ref 6.5–8.1)

## 2024-04-17 LAB — BASIC METABOLIC PANEL WITH GFR
Anion gap: 9 (ref 5–15)
Anion gap: 11 (ref 5–15)
BUN: 7 mg/dL — ABNORMAL LOW (ref 8–23)
BUN: 7 mg/dL — ABNORMAL LOW (ref 8–23)
CO2: 18 mmol/L — ABNORMAL LOW (ref 22–32)
CO2: 19 mmol/L — ABNORMAL LOW (ref 22–32)
Calcium: 7.4 mg/dL — ABNORMAL LOW (ref 8.9–10.3)
Calcium: 7.4 mg/dL — ABNORMAL LOW (ref 8.9–10.3)
Chloride: 94 mmol/L — ABNORMAL LOW (ref 98–111)
Chloride: 96 mmol/L — ABNORMAL LOW (ref 98–111)
Creatinine, Ser: 0.75 mg/dL (ref 0.61–1.24)
Creatinine, Ser: 0.7 mg/dL (ref 0.61–1.24)
GFR, Estimated: >60 mL/min (ref >60)
GFR, Estimated: >60 mL/min (ref >60)
Glucose, Bld: 216 mg/dL — ABNORMAL HIGH (ref 70–99)
Glucose, Bld: 97 mg/dL (ref 70–99)
Potassium: 3.1 mmol/L — ABNORMAL LOW (ref 3.5–5.1)
Potassium: 3.5 mmol/L (ref 3.5–5.1)
Sodium: 121 mmol/L — ABNORMAL LOW (ref 135–145)
Sodium: 126 mmol/L — ABNORMAL LOW (ref 135–145)

## 2024-04-17 LAB — SODIUM, URINE, RANDOM: Sodium, Ur: <30 mmol/L

## 2024-04-17 LAB — CBC
HCT: 25.9 % — ABNORMAL LOW (ref 39.0–52.0)
Hemoglobin: 9.5 g/dL — ABNORMAL LOW (ref 13.0–17.0)
MCH: 33.9 pg (ref 26.0–34.0)
MCHC: 36.7 g/dL — ABNORMAL HIGH (ref 30.0–36.0)
MCV: 92.5 fL (ref 80.0–100.0)
Platelets: 261 K/uL (ref 150–400)
RBC: 2.8 MIL/uL — ABNORMAL LOW (ref 4.22–5.81)
RDW: 12.7 % (ref 11.5–15.5)
WBC: 10.2 K/uL (ref 4.0–10.5)
nRBC: 0 % (ref 0.0–0.2)

## 2024-04-17 LAB — CBC WITH DIFFERENTIAL/PLATELET
Abs Immature Granulocytes: 0.11 K/uL — ABNORMAL HIGH (ref 0.00–0.07)
Basophils Absolute: 0 K/uL (ref 0.0–0.1)
Basophils Relative: 0 %
Eosinophils Absolute: 0 K/uL (ref 0.0–0.5)
Eosinophils Relative: 0 %
HCT: 29.4 % — ABNORMAL LOW (ref 39.0–52.0)
Hemoglobin: 10.5 g/dL — ABNORMAL LOW (ref 13.0–17.0)
Immature Granulocytes: 1 %
Lymphocytes Relative: 6 %
Lymphs Abs: 0.8 K/uL (ref 0.7–4.0)
MCH: 33.7 pg (ref 26.0–34.0)
MCHC: 35.7 g/dL (ref 30.0–36.0)
MCV: 94.2 fL (ref 80.0–100.0)
Monocytes Absolute: 0.6 K/uL (ref 0.1–1.0)
Monocytes Relative: 5 %
Neutro Abs: 11 K/uL — ABNORMAL HIGH (ref 1.7–7.7)
Neutrophils Relative %: 88 %
Platelets: 288 K/uL (ref 150–400)
RBC: 3.12 MIL/uL — ABNORMAL LOW (ref 4.22–5.81)
RDW: 12.9 % (ref 11.5–15.5)
WBC: 12.6 K/uL — ABNORMAL HIGH (ref 4.0–10.5)
nRBC: 0 % (ref 0.0–0.2)

## 2024-04-17 LAB — CORTISOL: Cortisol, Plasma: 19.5 ug/dL

## 2024-04-17 LAB — MAGNESIUM
Magnesium: 1.5 mg/dL — ABNORMAL LOW (ref 1.7–2.4)
Magnesium: 2 mg/dL (ref 1.7–2.4)

## 2024-04-17 LAB — PHOSPHORUS: Phosphorus: 2.1 mg/dL — ABNORMAL LOW (ref 2.5–4.6)

## 2024-04-17 LAB — OSMOLALITY: Osmolality: 256 mosm/kg — ABNORMAL LOW (ref 275–295)

## 2024-04-17 LAB — OSMOLALITY, URINE: Osmolality, Ur: 339 mosm/kg (ref 300–900)

## 2024-04-17 LAB — TSH: TSH: 0.639 u[IU]/mL (ref 0.350–4.500)

## 2024-04-17 LAB — CORTISOL-AM, BLOOD: Cortisol - AM: 10.8 ug/dL (ref 6.7–22.6)

## 2024-04-17 LAB — PROTIME-INR
INR: 1 (ref 0.8–1.2)
Prothrombin Time: 14.1 s (ref 11.4–15.2)

## 2024-04-17 LAB — GLUCOSE, CAPILLARY
Glucose-Capillary: 110 mg/dL — ABNORMAL HIGH (ref 70–99)
Glucose-Capillary: 85 mg/dL (ref 70–99)
Glucose-Capillary: 140 mg/dL — ABNORMAL HIGH (ref 70–99)
Glucose-Capillary: 109 mg/dL — ABNORMAL HIGH (ref 70–99)
Glucose-Capillary: 120 mg/dL — ABNORMAL HIGH (ref 70–99)

## 2024-04-17 LAB — T4, FREE: Free T4: 1.2 ng/dL — ABNORMAL HIGH (ref 0.61–1.12)

## 2024-04-17 LAB — URIC ACID: Uric Acid, Serum: 4.7 mg/dL (ref 3.7–8.6)

## 2024-04-17 LAB — CREATININE, URINE, RANDOM: Creatinine, Urine: 121 mg/dL

## 2024-04-17 LAB — HIV ANTIBODY (ROUTINE TESTING W REFLEX): HIV Screen 4th Generation wRfx: NONREACTIVE

## 2024-04-17 LAB — AMMONIA: Ammonia: <13 umol/L (ref 9–35)

## 2024-04-17 MED ORDER — POTASSIUM CHLORIDE CRYS ER 20 MEQ PO TBCR
40.0000 meq | EXTENDED_RELEASE_TABLET | Freq: Two times a day (BID) | ORAL | Status: DC
  Administered 2024-04-17 – 2024-04-18 (×3): 40 meq via ORAL
  Filled 2024-04-17 (×3): qty 2

## 2024-04-17 MED ORDER — HEPARIN SODIUM (PORCINE) 5000 UNIT/ML IJ SOLN
5000.0000 [IU] | Freq: Three times a day (TID) | INTRAMUSCULAR | Status: DC
  Administered 2024-04-17 – 2024-04-29 (×35): 5000 [IU] via SUBCUTANEOUS
  Filled 2024-04-17 (×37): qty 1

## 2024-04-17 MED ORDER — DEXTROSE 50 % IV SOLN
1.0000 | INTRAVENOUS | Status: DC | PRN
  Administered 2024-04-21 – 2024-04-30 (×2): 50 mL via INTRAVENOUS
  Filled 2024-04-17 (×3): qty 50

## 2024-04-17 MED ORDER — MAGNESIUM SULFATE 4 GM/100ML IV SOLN
4.0000 g | Freq: Once | INTRAVENOUS | Status: AC
  Administered 2024-04-17: 4 g via INTRAVENOUS
  Filled 2024-04-17: qty 100

## 2024-04-17 MED ORDER — IOHEXOL 350 MG/ML SOLN
50.0000 mL | Freq: Once | INTRAVENOUS | Status: AC | PRN
  Administered 2024-04-17: 50 mL via INTRAVENOUS

## 2024-04-17 MED ORDER — SODIUM CHLORIDE 0.9 % IV SOLN: INTRAVENOUS | Status: DC

## 2024-04-17 MED ORDER — LACTATED RINGERS IV BOLUS
500.0000 mL | Freq: Once | INTRAVENOUS | Status: AC
  Administered 2024-04-17: 500 mL via INTRAVENOUS

## 2024-04-17 MED ORDER — SODIUM CHLORIDE 0.9 % IV SOLN
3.0000 g | Freq: Four times a day (QID) | INTRAVENOUS | Status: DC
  Administered 2024-04-17 – 2024-04-27 (×39): 3 g via INTRAVENOUS
  Filled 2024-04-17 (×39): qty 8

## 2024-04-17 MED ORDER — BOOST / RESOURCE BREEZE PO LIQD CUSTOM
1.0000 | Freq: Three times a day (TID) | ORAL | Status: DC
  Administered 2024-04-17 – 2024-05-02 (×21): 1 via ORAL
  Filled 2024-04-17: qty 1

## 2024-04-17 MED ORDER — DEXTROSE 5 % IV SOLN
30.0000 mmol | Freq: Once | INTRAVENOUS | Status: AC
  Administered 2024-04-17: 30 mmol via INTRAVENOUS
  Filled 2024-04-17: qty 10

## 2024-04-17 NOTE — Progress Notes (Addendum)
 Initial Nutrition Assessment  DOCUMENTATION CODES:   Severe malnutrition in context of social or environmental circumstances  INTERVENTION:  Encourage PO intake - currently on dysphagia 2, thin liquids with 1200 ml fluid restriction  Room service with assist Nursing to assist with feeding pt  Boost Breeze po TID, each supplement provides 250 kcal and 9 grams of protein MVI with minerals daily  Continue Thiamine  and folic acid  supplementation  Recommend updated weight Monitor magnesium , potassium, and phosphorus daily for at least 3 days, MD to replete as needed, as pt is at risk for refeeding syndrome  *Lactose intolerant    NUTRITION DIAGNOSIS:   Severe Malnutrition related to social / environmental circumstances, chronic illness (Chronic alcohol abuse and homelessness) as evidenced by severe muscle depletion, severe fat depletion.  GOAL:   Patient will meet greater than or equal to 90% of their needs  MONITOR:   PO intake, Supplement acceptance, Diet advancement, Labs, I & O's, Skin  REASON FOR ASSESSMENT:   Consult Assessment of nutrition requirement/status  ASSESSMENT:  68 y.o. year old male with PMHof heavy alcohol use (9 beers per day), DVT, PUD, partial gastrectomy, smoking, prediabetes, HTN, cervical spinal stenosis, and homelessness.  Presented to the ED after being found by a bystander in a parking lot.  Workup consistent with being hypoglycemic, hyponatremia, dehydration with multiple electrolyte abnormalities.  Chest x-ray also showed left upper lobe cavitary lesion.  Pt is a poor historian at this time, question wether history obtained is fully accurate.   Pt is homeless and it is unknown how pt is receiving meals. Pt denies having trouble getting food as his brother brings him meals sometimes. Pt says he eats 3 meals per day but unknown from where. Has been drinking 9 beers per day for years. Pt with limited weight history. HE states he used to weigh 160 lbs  and is now 140-145 lbs. Unknown timeframe of weight loss. Current weight is likely stated, asked nursing to provide updated weight. On exam pt with severe muscle and fat wasting.  Suspect pt is lower than stated weight however, unable to quantify at this time.   Pt unable to remember what he had for breakfast. RN unsure how he did with breakfast. On visit pt stated he was starving RD took pt's lunch order. Pt does not Ensures and reports he is lactose intolerant, willing to try Boost Breeze. Speech saw today and noticed pt with difficulty eating, diet downgraded to Dysphagia 2, thin liquids. Pt is severely malnourished, no wounds noted. Monitor PO intake. Pt severely malnourished, already with multiple electrolyte abnormalities, MD repleting. Pt at risk of refeeding syndrome. On IV fluids. Was hypoglycemia on admission likely due to poor oral intake.   Admit weight: 68 kg - Stated? Current weight: 68 kg   Average Meal Intake: No meals recorded   Nutritionally Relevant Medications: Scheduled Meds:  feeding supplement  237 mL Oral BID BM   folic acid   1 mg Oral Daily   multivitamin with minerals  1 tablet Oral Daily   thiamine   100 mg Oral Daily   Or   thiamine   100 mg Intravenous Daily   Continuous Infusions:  sodium chloride  75 mL/hr at 04/17/24 1011   ampicillin -sulbactam (UNASYN ) IV 3 g (04/17/24 1338)   lactated ringers      potassium PHOSPHATE  IVPB (in mmol) 30 mmol (04/17/24 1347)   Labs Reviewed: Sodium 124 Creatinine 0.59 Calcium  8.2 Phosphorus 2.1 Magnesium  1.5 CBG ranges from 85-140 mg/dL over the last 24  hours HgbA1c 4.1  NUTRITION - FOCUSED PHYSICAL EXAM:  Flowsheet Row Most Recent Value  Orbital Region Moderate depletion  Upper Arm Region Severe depletion  Thoracic and Lumbar Region Moderate depletion  Buccal Region Moderate depletion  Temple Region Severe depletion  Clavicle Bone Region Severe depletion  Clavicle and Acromion Bone Region Severe depletion   Scapular Bone Region Severe depletion  Dorsal Hand Severe depletion  Patellar Region Severe depletion  Anterior Thigh Region Severe depletion  Posterior Calf Region Severe depletion  Edema (RD Assessment) None  Hair Reviewed  Eyes Reviewed  Mouth Reviewed  Skin Reviewed  [Dry skin especially on legs,]  Nails Reviewed  [Overgrown ree/yellow toenails]    Diet Order:   Diet Order             DIET DYS 2 Room service appropriate? No; Fluid consistency: Thin; Fluid restriction: 1200 mL Fluid  Diet effective now                   EDUCATION NEEDS:   Not appropriate for education at this time  Skin:  Skin Assessment: Reviewed RN Assessment  Last BM:  9/9, x3 BM  Height:   Ht Readings from Last 1 Encounters:  04/15/24 5' 5 (1.651 m)    Weight:   Wt Readings from Last 1 Encounters:  04/15/24 68 kg    Ideal Body Weight:  61.8 kg  BMI:  Body mass index is 24.96 kg/m.  Estimated Nutritional Needs:   Kcal:  1700-2000 kcal  Protein:  75-100 gm  Fluid:  1.2L fluid restriction per MD   Olivia Kenning, RD Registered Dietitian  See Amion for more information

## 2024-04-17 NOTE — Progress Notes (Signed)
 [facility] NEPHROLOGY PROGRESS NOTE  Assessment/ Plan: Pt is a 68 y.o. yo male  with past medical history significant for alcohol abuse, PVD, DVT, hypertension, homelessness, chronic hyponatremia presented with alcohol intoxication, seen as a consultation for the evaluation of hyponatremia.   # Acute on chronic hyponatremia due to beer potomania/reduced solute intake.  Baseline sodium level seems to be around anywhere between 120-130 however presented with lowest sodium level of 112.   Treated with normal saline and salt tablet with improvement of serum sodium level to 124.  Continue NS, salt tablet.  Encourage high-protein diet.  TSH level acceptable. Noted patient is febrile and new cavitary lesion in the lung.  May need fluid bolus for resuscitation if become hypotensive.  Continue frequent lab check.  Discussed with primary team.   # Alcohol intoxication, watch for alcohol withdrawal: Currently on thiamine , multivitamin and hydration per primary team.   # Protein calorie malnutrition: Needs high-protein diet.    # Acute febrile illness/left lung cavitary lesion/sepsis.  Getting CT scan, pulmonary consult.  Management per primary team.  Subjective: Seen and examined at bedside.  No event overnight however developed fever this morning.  Denies nausea, vomiting, chest pain or shortness of breath.  Sodium level improved to 124. Objective Vital signs in last 24 hours: Vitals:   04/17/24 0814 04/17/24 0850 04/17/24 0916 04/17/24 0956  BP: 125/75 125/75 133/72 133/75  Pulse: 94 96 (!) 104 (!) 107  Resp: (!) 21  (!) 21 (!) 22  Temp:   (!) 102.4 F (39.1 C) (!) 102.1 F (38.9 C)  TempSrc:   Oral Oral  SpO2: 100%  100% 99%  Weight:      Height:       Weight change:   Intake/Output Summary (Last 24 hours) at 04/17/2024 1032 Last data filed at 04/17/2024 0419 Gross per 24 hour  Intake 740 ml  Output --  Net 740 ml       Labs: RENAL PANEL Recent Labs  Lab  04/16/24 0334 04/16/24 0531 04/16/24 0743 04/16/24 1101 04/16/24 1610 04/16/24 1844 04/16/24 2328 04/17/24 0807  NA 114*   < > 115* 118* 120* 119* 121* 124*  K  --   --  3.7  --  4.1 3.4* 3.5 3.7  CL  --   --  82*  --  89* 87* 91* 90*  CO2  --   --  20*  --  22 20* 21* 23  GLUCOSE  --   --  88  --  93 127* 104* 86  BUN  --   --  7*  --  6* 8 8 8   CREATININE  --   --  0.44*  --  0.57* 0.56* 0.59* 0.59*  CALCIUM   --   --  8.0*  --  8.2* 7.6* 7.8* 8.2*  MG 1.5*  --  3.2*  --   --   --  1.5*  --   PHOS  --   --  2.5  --   --   --   --  2.1*  ALBUMIN 2.2*  --  2.0*  --   --   --  1.8* 2.2*   < > = values in this interval not displayed.    Liver Function Tests: Recent Labs  Lab 04/16/24 0743 04/16/24 2328 04/17/24 0807  AST 35 35 32  ALT 16 15 17   ALKPHOS 66 67 71  BILITOT 1.0 0.9 1.2  PROT 5.4* 4.6* 5.9*  ALBUMIN  2.0* 1.8* 2.2*   No results for input(s): LIPASE, AMYLASE in the last 168 hours. Recent Labs  Lab 04/17/24 0807  AMMONIA <13   CBC: Recent Labs    02/20/24 1653 02/21/24 0139 04/15/24 2043 04/15/24 2045 04/16/24 0743 04/16/24 2328 04/17/24 0807  HGB  --    < > 13.3 12.1* 9.5* 9.5* 10.5*  MCV  --    < >  --  93.4 91.9 92.5 94.2  VITAMINB12 414  --   --   --   --   --   --   FOLATE 9.3  --   --   --   --   --   --    < > = values in this interval not displayed.    Cardiac Enzymes: No results for input(s): CKTOTAL, CKMB, CKMBINDEX, TROPONINI in the last 168 hours. CBG: Recent Labs  Lab 04/16/24 1556 04/16/24 2142 04/16/24 2357 04/17/24 0341 04/17/24 0914  GLUCAP 106* 157* 120* 110* 85    Iron Studies: No results for input(s): IRON, TIBC, TRANSFERRIN, FERRITIN in the last 72 hours. Studies/Results: DG Chest Port 1 View Result Date: 04/17/2024 EXAM: 1 VIEW XRAY OF THE CHEST 04/17/2024 06:22:11 AM COMPARISON: 02/21/2024 CLINICAL HISTORY: SOB (shortness of breath) 141880. Per chart - Hypoglycemia; Alcohol Intoxication; Reason  for exam - SOB FINDINGS: LUNGS AND PLEURA: Persistent pleural thickening over the left upper lobe. Persistent cavitary process within the left upper lobe measuring approximately 6.1 cm on today's study. Loculated air overlying the peripheral left upper lobe may reflect underlying bronchopulmonary fistula. New airspace disease is identified within the left base and right mid lung. No pleural effusion. HEART AND MEDIASTINUM: No acute abnormality of the cardiac and mediastinal silhouettes. BONES AND SOFT TISSUES: No acute osseous abnormality. Surgical clips at GE junction. IMPRESSION: 1. Persistent cavitary process within the left upper lobe, measuring approximately 6.1 cm, with associated pleural thickening. 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 2. New airspace disease within the left base and right mid lung. 3. No pleural effusion. Electronically signed by: Cody Calk MD 04/17/2024 06:37 AM EDT RP Workstation: [id number]    Medications: Infusions:  sodium chloride  75 mL/hr at 04/17/24 1011   ampicillin -sulbactam (UNASYN ) IV     potassium PHOSPHATE  IVPB (in mmol)      Scheduled Medications:  feeding supplement  237 mL Oral BID BM   folic acid   1 mg Oral Daily   heparin  injection (subcutaneous)  5,000 Units Subcutaneous Q8H   lidocaine   1 patch Transdermal Q24H   multivitamin with minerals  1 tablet Oral Daily   nicotine   14 mg Transdermal Daily   pantoprazole   40 mg Oral Daily   sodium chloride   1 g Oral TID WC   thiamine   100 mg Oral Daily   Or   thiamine   100 mg Intravenous Daily    have reviewed scheduled and prn medications.  Physical Exam: General:NAD, comfortable Heart:RRR, s1s2 nl Lungs:clear b/l, no crackle Abdomen:soft, Non-tender, non-distended Extremities:No edema Neurology: Alert, awake and following commands  C[facility] 04/17/2024,10:32 AM  LOS: 1 day

## 2024-04-17 NOTE — Evaluation (Signed)
 Clinical/Bedside Swallow Evaluation Patient Details  Name: [facility] Glenn MRN: [id number] Date of Birth: Nov 25, 1955  Today's Date: 04/17/2024 Time: SLP Start Time (ACUTE ONLY): 1100 SLP Stop Time (ACUTE ONLY): 1115 SLP Time Calculation (min) (ACUTE ONLY): 15 min  Past Medical History:  Past Medical History:  Diagnosis Date   Alcohol use    Hyperbilirubinemia 07/01/2020   Peptic ulcer 08/09/1987   Jul 28, 2008 Entered By: KATHERINE ETHA HERO Comment: s/p partial gastrectomy   Past Surgical History:  Past Surgical History:  Procedure Laterality Date   FLEXIBLE BRONCHOSCOPY Left 02/21/2024   Procedure: BRONCHOSCOPY, FLEXIBLE;  Surgeon: Shelah Lamar RAMAN, MD;  Location: [facility];  Service: Cardiopulmonary;  Laterality: Left;   ulcer surgery in stomach     pt states he had this approx 33 years ago   HPI:  Cody Glenn is a 68 yo male presenting to ED 9/8 after being found down in a parking lot with hypoglycemia, hyponatremia, and dehydration. CXR shows LUL cavitary process with associated pleural thickening and new airspace disease within the L base and R mid lung. CT Chest pending. PMH includes daily alcohol use, DVT, PUD, history of partial gastrectomy, current tobacco use, cervical spinal stenosis, primary hypertension, and homelessness    Assessment / Plan / Recommendation  Clinical Impression  Pt is alert but confused, having difficulty feeding himself with his breakfast tray positioned in front of him. He is edentulous, contributing to prolonged mastication with eventual expectoration of regular solids. Oral transit is timely with purees and thin liquids. He completed the 3 oz water test without signs clinically concerning for aspiration. Suspect daily alcohol use increases risk of esophageal dysphagia and post-prandial aspiration. Given difficulty with mastication, recommend downgrade to Dys 2 solids and thin liquids with increased supervision at mealtimes. SLP will f/u.  SLP Visit  Diagnosis: Dysphagia, unspecified (R13.10)    Aspiration Risk  Moderate aspiration risk    Diet Recommendation Dysphagia 2 (Fine chop);Thin liquid    Liquid Administration via: Cup;Straw Medication Administration: Whole meds with puree Supervision: Staff to assist with self feeding;Full supervision/cueing for compensatory strategies Compensations: Slow rate;Small sips/bites Postural Changes: Seated upright at 90 degrees;Remain upright for at least 30 minutes after po intake    Other  Recommendations Oral Care Recommendations: Oral care BID     Assistance Recommended at Discharge    Functional Status Assessment Patient has had a recent decline in their functional status and demonstrates the ability to make significant improvements in function in a reasonable and predictable amount of time.  Frequency and Duration min 2x/week  1 week       Prognosis Prognosis for improved oropharyngeal function: Good Barriers to Reach Goals: Time post onset      Swallow Study   General HPI: [facility] Glenn is a 68 yo male presenting to ED 9/8 after being found down in a parking lot with hypoglycemia, hyponatremia, and dehydration. CXR shows LUL cavitary process with associated pleural thickening and new airspace disease within the L base and R mid lung. CT Chest pending. PMH includes daily alcohol use, DVT, PUD, history of partial gastrectomy, current tobacco use, cervical spinal stenosis, primary hypertension, and homelessness Type of Study: Bedside Swallow Evaluation Previous Swallow Assessment: none in chart Diet Prior to this Study: Regular;Thin liquids (Level 0) Temperature Spikes Noted: No Respiratory Status: Room air History of Recent Intubation: No Behavior/Cognition: Alert;Cooperative Oral Cavity Assessment: Within Functional Limits Oral Care Completed by SLP: No Oral Cavity - Dentition: Edentulous Vision: Functional  for self-feeding Self-Feeding Abilities: Needs assist Patient  Positioning: Upright in bed Baseline Vocal Quality: Normal Volitional Cough: Strong Volitional Swallow: Able to elicit    Oral/Motor/Sensory Function Overall Oral Motor/Sensory Function: Within functional limits   Ice Chips Ice chips: Not tested   Thin Liquid Thin Liquid: Within functional limits Presentation: Straw    Nectar Thick Nectar Thick Liquid: Not tested   Honey Thick Honey Thick Liquid: Not tested   Puree Puree: Within functional limits Presentation: Spoon   Solid     Solid: Impaired Presentation: Spoon Oral Phase Functional Implications: Impaired mastication      Damien Blumenthal, M.A., CCC-SLP Speech Language Pathology, Acute Rehabilitation Services  Secure Chat preferred [phone]  04/17/2024,11:58 AM

## 2024-04-17 NOTE — Evaluation (Signed)
 Physical Therapy Evaluation Patient Details Name: Cody [facility] MRN: [id number] DOB: Mar 06, 1956 Today's Date: 04/17/2024  History of Present Illness  Pt is 68 year old presented to [facility] on  04/15/24 for acute on chronic hyponatremia due to alcohol use. SABRA PMH - etoh abuse, DVT, htn, cervical stenosis, partial gastrectomy  Clinical Impression  Attempted to evaluate multiple times with multiple delays and interruptions. Pt eventually seen but he was focused on eating the lunch I had helped him order prior. Agreed to stand at EOB but didn't amb due to pt wanting to eat as well as IV site leaking. Pt is homeless and stated he may go home with sister but I suspect this may not be an option. May need continued inpatient follow up therapy, <3 hours/day if he isn't close to his baseline with mobility. Will monitor and update dc recommendations as needed and as we see better what he is able to do.           If plan is discharge home, recommend the following: Assist for transportation   Can travel by private vehicle   Yes    Equipment Recommendations None recommended by PT  Recommendations for Other Services       Functional Status Assessment Patient has had a recent decline in their functional status and demonstrates the ability to make significant improvements in function in a reasonable and predictable amount of time.     Precautions / Restrictions Precautions Precautions: Fall Restrictions Weight Bearing Restrictions Per Provider Order: No      Mobility  Bed Mobility Overal bed mobility: Needs Assistance Bed Mobility: Supine to Sit, Sit to Supine     Supine to sit: Min assist Sit to supine: Min assist   General bed mobility comments: Assist to elevate trunk and assist to bring feet back up into bed.    Transfers Overall transfer level: Needs assistance Equipment used: Rollator (4 wheels) Transfers: Sit to/from Stand Sit to Stand: Min assist           General transfer  comment: Assist to power up.    Ambulation/Gait               General Gait Details: Side stepped up side of bed  Stairs            Wheelchair Mobility     Tilt Bed    Modified Rankin (Stroke Patients Only)       Balance Overall balance assessment: Needs assistance Sitting-balance support: No upper extremity supported, Feet supported Sitting balance-Leahy Scale: Fair     Standing balance support: Bilateral upper extremity supported, During functional activity, Reliant on assistive device for balance Standing balance-Leahy Scale: Poor Standing balance comment: UE support                             Pertinent Vitals/Pain Pain Assessment Pain Assessment: No/denies pain    Home Living Family/patient expects to be discharged to:: Shelter/Homeless                   Additional Comments: Pt stated he may go home with sister but I am unsure that this is an option.    Prior Function Prior Level of Function : Independent/Modified Independent             Mobility Comments: Ambulates with rollator       Extremity/Trunk Assessment   Upper Extremity Assessment Upper Extremity Assessment: Defer to OT evaluation  Lower Extremity Assessment Lower Extremity Assessment: Generalized weakness       Communication   Communication Communication: No apparent difficulties    Cognition Arousal: Alert Behavior During Therapy: WFL for tasks assessed/performed   PT - Cognitive impairments: No family/caregiver present to determine baseline, Memory, Safety/Judgement, Problem solving                       PT - Cognition Comments: Had to repeatedly explain that he couldn't have crackers or bread on his current diet and why Following commands: Impaired Following commands impaired: Follows one step commands with increased time     Cueing Cueing Techniques: Tactile cues, Verbal cues     General Comments General comments (skin  integrity, edema, etc.): VSS on RA    Exercises     Assessment/Plan    PT Assessment Patient needs continued PT services  PT Problem List Decreased strength;Decreased activity tolerance;Decreased balance;Decreased mobility;Decreased safety awareness       PT Treatment Interventions DME instruction;Gait training;Functional mobility training;Therapeutic activities;Therapeutic exercise;Balance training;Patient/family education    PT Goals (Current goals can be found in the Care Plan section)  Acute Rehab PT Goals Patient Stated Goal: get something to eat PT Goal Formulation: With patient Time For Goal Achievement: 05/01/24 Potential to Achieve Goals: Good    Frequency Min 2X/week     Co-evaluation               AM-PAC PT 6 Clicks Mobility  Outcome Measure Help needed turning from your back to your side while in a flat bed without using bedrails?: A Little Help needed moving from lying on your back to sitting on the side of a flat bed without using bedrails?: A Little Help needed moving to and from a bed to a chair (including a wheelchair)?: A Little Help needed standing up from a chair using your arms (e.g., wheelchair or bedside chair)?: A Little Help needed to walk in hospital room?: A Little Help needed climbing 3-5 steps with a railing? : Total 6 Click Score: 16    End of Session   Activity Tolerance: Patient tolerated treatment well Patient left: in bed;with call bell/phone within reach;with bed alarm set Nurse Communication: Other (comment) (IV leaking) PT Visit Diagnosis: Unsteadiness on feet (R26.81);Other abnormalities of gait and mobility (R26.89);Muscle weakness (generalized) (M62.81)    Time: 1540-1556 (Plus 1453 to 1507 prior to interuption for CT) PT Time Calculation (min) (ACUTE ONLY): 16 min   Charges:   PT Evaluation $PT Eval Moderate Complexity: 1 Mod PT Treatments $Gait Training: 8-22 mins PT General Charges $$ ACUTE PT VISIT: 1 Visit          [facility] PT Acute Rehabilitation Services Office [phone]   Rodgers ORN [facility] 04/17/2024, 4:47 PM

## 2024-04-17 NOTE — Consult Note (Signed)
 NAME:  Cody Glenn, MRN:  [id number], DOB:  09/27/1955, LOS: 1 ADMISSION DATE:  04/15/2024, CONSULTATION DATE:  04/17/24 REFERRING MD:  Dennise, CHIEF COMPLAINT:  abnormal chest xr   History of Present Illness:  68 yo M PMH etoh abuse, dvt, DM, HTN, cavitary lung lesion who was admitted to [facility] 04/16/24 after presenting to ED for AMS. He was found to be hypoglycemic which was addressed, and have AoC hyponatremia for which nephrology was consulted.  A CXR was obtained 9/10 which reveals LUL cavitary lesion, and air concerning for possible BPF.    PCCM consulted in this setting   He has a known LUL cavitary lesion that was worked up on prior admission, and was dc in July on 3wk augmentin . He reports to me he completed this. No resp complaints. Does endorse frequent coughing when he swallows thin liquids, specifically noting that he coughs a lot when he drinks alcohol.   Pertinent  Medical History  Etoh abuse  Cavitary lung lesion Malnutrition Dvt HTN   Significant Hospital Events: Including procedures, antibiotic start and stop dates in addition to other pertinent events   9/9 admitted 9/10 pulm consult   Interim History / Subjective:  Febrile 102   Objective    Blood pressure (!) 89/59, pulse 87, temperature 99.7 F (37.6 C), temperature source Oral, resp. rate (!) 25, height 5' 5 (1.651 m), weight 68 kg, SpO2 99%.        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  Suspected dysphagia  P -agree with plan for CT  chest -agree with unasyn   -SLP is going to evaluate him today  -ordering an expectorated sputum  -MRSA swab is pending   Labs   CBC: Recent Labs  Lab 04/15/24 2043 04/15/24 2045 04/16/24 0743 04/16/24 2328 04/17/24 0807  WBC  --  15.2* 10.3 10.2 12.6*  NEUTROABS  --  12.7*  --   --  11.0*  HGB 13.3 12.1* 9.5* 9.5* 10.5*  HCT 39.0 33.9* 26.0* 25.9* 29.4*  MCV  --  93.4 91.9 92.5 94.2  PLT  --  277 261 261 288    Basic Metabolic Panel: Recent Labs  Lab 04/16/24 0334 04/16/24 0531 04/16/24 0743 04/16/24 1101 04/16/24 1610 04/16/24 1844 04/16/24 2328 04/17/24 0807  NA 114*   < > 115* 118* 120* 119* 121* 124*  K  --   --  3.7  --  4.1 3.4* 3.5 3.7  CL  --   --  82*  --  89* 87* 91* 90*  CO2  --   --  20*  --  22 20* 21* 23  GLUCOSE  --   --  88  --  93 127* 104* 86  BUN  --   --  7*  --  6* 8 8 8   CREATININE  --   --  0.44*  --  0.57* 0.56* 0.59* 0.59*  CALCIUM   --   --  8.0*  --  8.2* 7.6* 7.8* 8.2*  MG 1.5*  --  3.2*  --   --   --  1.5*  --   PHOS  --   --  2.5  --   --   --   --  2.1*   < > = values in this interval not displayed.   GFR: Estimated Creatinine Clearance: 77.9 mL/min (A) (by C-G formula based on SCr of 0.59 mg/dL (L)). Recent Labs  Lab 04/15/24 2045 04/16/24 0743 04/16/24 2328 04/17/24 0807  WBC 15.2* 10.3 10.2 12.6*    Liver Function Tests: Recent Labs  Lab 04/16/24 0334 04/16/24 0743 04/16/24 2328 04/17/24 0807  AST 44* 35 35 32  ALT 18 16 15 17   ALKPHOS 73 66 67 71  BILITOT 1.5* 1.0 0.9 1.2  PROT 6.1* 5.4* 4.6* 5.9*  ALBUMIN 2.2* 2.0* 1.8* 2.2*   No results for input(s): LIPASE, AMYLASE in the last 168 hours. Recent Labs  Lab 04/17/24 0807  AMMONIA <13    ABG    Component Value Date/Time   TCO2 20 (L) 04/15/2024 2043     Coagulation Profile: Recent Labs  Lab 04/17/24 0807  INR 1.0    Cardiac Enzymes: No results for input(s): CKTOTAL, CKMB, CKMBINDEX, TROPONINI in the last 168 hours.  HbA1C: Hgb  A1c MFr Bld  Date/Time Value Ref Range Status  04/16/2024 11:01 AM 4.1 (L) 4.8 - 5.6 % Final    Comment:    (NOTE) Diagnosis of Diabetes The following HbA1c ranges recommended by the American Diabetes Association (ADA) may be used as an aid in the diagnosis of diabetes mellitus.  Hemoglobin             Suggested A1C NGSP%              Diagnosis  <5.7                   Non Diabetic  5.7-6.4                Pre-Diabetic  >6.4                   Diabetic  <7.0                   Glycemic control for                       adults with diabetes.    10/13/2020 02:08 PM 5.5 4.8 - 5.6 % Final    Comment:             Prediabetes: 5.7 - 6.4          Diabetes: >6.4          Glycemic control for adults with diabetes: <7.0     CBG: Recent Labs  Lab 04/16/24 2142 04/16/24 2357 04/17/24 0341 04/17/24 0914 04/17/24 1204  GLUCAP 157* 120* 110* 85 140*    Review of Systems:   Review of Systems  Constitutional:  Positive for fever.     Past Medical History:  He,  has a past medical history of Alcohol use, Hyperbilirubinemia (07/01/2020), and Peptic ulcer (08/09/1987).   Surgical History:   Past Surgical History:  Procedure Laterality Date   FLEXIBLE BRONCHOSCOPY Left 02/21/2024   Procedure: BRONCHOSCOPY, FLEXIBLE;  Surgeon: Shelah Lamar RAMAN, MD;  Location: [facility];  Service: Cardiopulmonary;  Laterality: Left;   ulcer surgery in stomach     pt states he had this approx  33 years ago     Social History:   reports that he has been smoking cigarettes. He has a 40 pack-year smoking history. He has never used smokeless tobacco. He reports current alcohol use. He reports that he does not currently use drugs.   Family History:  His family history is not on file.   Allergies No Known Allergies   Home Medications  Prior to Admission medications   Medication Sig Start Date End Date Taking? Authorizing Provider  folic acid  (FOLVITE ) 1 MG tablet Take 1 tablet (1 mg total) by  mouth daily. 02/28/24  Yes Lue Elsie BROCKS, MD  thiamine  (VITAMIN B1) 100 MG tablet Take 1 tablet (100 mg total) by mouth daily. 02/28/24  Yes Lue Elsie BROCKS, MD     Critical care time: na      Mod MDM    Ronnald Gave MSN, AGACNP-BC [facility] Pulmonary/Critical Care Medicine Amion for pager 04/17/2024, 1:47 PM

## 2024-04-17 NOTE — Progress Notes (Signed)
 PROGRESS NOTE                                                                                                                                                                                                             Patient Demographics:    Cody Glenn, is a 68 y.o. male, DOB - 1955-11-30, FMW:[id number]  Outpatient Primary MD for the patient is [name], MD    LOS - 1  Admit date - 04/15/2024    Chief Complaint  Patient presents with   Hypoglycemia   Alcohol Intoxication       Brief Narrative (HPI from H&P)    68 y.o. year old male with past medical history of heavy alcohol use, DVT, PUD, history of partial gastrectomy, active smoking, prediabetes, primary hypertension, cervical spinal stenosis, and homelessness.  He presents to [name], ED after being found by a bystander in a parking lot.  On EMS arrival patient was hypoglycemic and was given an amp of D50.  In the ER workup consistent with hyponatremia, dehydration with multiple electrolyte abnormalities.  Chest x-ray also showed left upper lobe cavitary lesion.   Subjective:    Cody Glenn today has, No headache, No chest pain, No abdominal pain - No Nausea, No new weakness tingling or numbness, mild dry cough no shortness of breath.   Assessment  & Plan :    Severe dehydration, hyponatremia, hypomagnesemia, hypocalcemia, hypophosphatemia.  In a patient with ongoing alcohol abuse, homeless. Nephrology on board being gently hydrated, sodium gradually improving, electrolytes have been replaced and will be monitored.   Left lung cavitary lesion with high fever.  Septic.  Homeless, alcoholic, high risk for chronic aspiration, sepsis workup initiated, IV fluids, pancultured, CT chest with IV contrast check MRSA nasal PCR, HIV, respiratory viral panel, COVID-19, empiric Unasyn .  Speech and pulmonary consult.   ##Protein Calorie Malnutrition  Dietitian  consulted.   #Alcohol use disorder Patient reports drinking 6-8 beers daily, reports last drink was yesterday - Patient counseled on importance of cessation - CIWA protocol with PRN symptom triggered Ativan    #Tobacco Use Disorder - Patient counseled on importance of cessation - Nicotine  replacement therapy with nicotine  patch and gum while inpatient  #Normocytic Anemia Stable   #COPD Not on any outpatient maintenance inhalers -PRN Albuterol    #Hypertension - Patient reports he is  not taking Diovan  - BP normotensive, monitor   #Peptic Ulcer Disease - Protonix    #Chronic Neck Pain - Lidocaine  patch - PRN toradol   #Normocytic Anemia Stable   #COPD Not on any outpatient maintenance inhalers -PRN Albuterol    #Hypertension - Patient reports he is not taking Diovan  - BP normotensive, monitor   #Peptic Ulcer Disease - Protonix    #Chronic Neck Pain - Lidocaine  patch - PRN toradol   ##Hypoglycemia - Likely due to poor oral intake, A1c was low, monitor on oral diet, check CBGs.  Check TSH, free T4 and cortisol levels as well.    Lab Results  Component Value Date   HGBA1C 4.1 (L) 04/16/2024   CBG (last 3)  Recent Labs    04/16/24 2357 04/17/24 0341 04/17/24 0914  GLUCAP 120* 110* 85        Condition - Extremely Guarded  Family Communication  : None present  Code Status : Full code  Consults  : Pulmonary, nephrology  PUD Prophylaxis :  PPI   Procedures  :     CT chest with IV contrast ordered 04/17/2024      Disposition Plan  :    Status is: Inpatient  DVT Prophylaxis  :    Place and maintain sequential compression device Start: 04/16/24 1157    Lab Results  Component Value Date   PLT 288 04/17/2024    Diet :  Diet Order             Diet regular Room service appropriate? Yes; Fluid consistency: Thin; Fluid restriction: 1200 mL Fluid  Diet effective now                    Inpatient Medications  Scheduled Meds:  feeding  supplement  237 mL Oral BID BM   folic acid   1 mg Oral Daily   lidocaine   1 patch Transdermal Q24H   multivitamin with minerals  1 tablet Oral Daily   nicotine   14 mg Transdermal Daily   pantoprazole   40 mg Oral Daily   sodium chloride   1 g Oral TID WC   thiamine   100 mg Oral Daily   Or   thiamine   100 mg Intravenous Daily   Continuous Infusions:  sodium chloride  75 mL/hr at 04/17/24 1011   potassium PHOSPHATE  IVPB (in mmol)     PRN Meds:.acetaminophen  **OR** acetaminophen , albuterol , ketorolac , LORazepam  **OR** LORazepam , melatonin, nicotine  polacrilex, ondansetron  **OR** ondansetron  (ZOFRAN ) IV, polyethylene glycol  Antibiotics  :    Anti-infectives (From admission, onward)    None         Objective:   Vitals:   04/17/24 0814 04/17/24 0850 04/17/24 0916 04/17/24 0956  BP: 125/75 125/75 133/72 133/75  Pulse: 94 96 (!) 104 (!) 107  Resp: (!) 21  (!) 21 (!) 22  Temp:   (!) 102.4 F (39.1 C) (!) 102.1 F (38.9 C)  TempSrc:   Oral Oral  SpO2: 100%  100% 99%  Weight:      Height:        Wt Readings from Last 3 Encounters:  04/15/24 68 kg  03/18/24 69 kg  02/20/24 69 kg     Intake/Output Summary (Last 24 hours) at 04/17/2024 1013 Last data filed at 04/17/2024 0419 Gross per 24 hour  Intake 740 ml  Output --  Net 740 ml     Physical Exam  Awake Alert, No new F.N deficits, Normal affect Robinson.AT,PERRAL Supple Neck, No JVD,   Symmetrical Chest wall movement,  coarse bilateral breath sounds RRR,No Gallops,Rubs or new Murmurs,  +ve B.Sounds, Abd Soft, No tenderness,   No Cyanosis, Clubbing or edema        Data Review:    Recent Labs  Lab 04/15/24 2043 04/15/24 2045 04/16/24 0743 04/16/24 2328 04/17/24 0807  WBC  --  15.2* 10.3 10.2 12.6*  HGB 13.3 12.1* 9.5* 9.5* 10.5*  HCT 39.0 33.9* 26.0* 25.9* 29.4*  PLT  --  277 261 261 288  MCV  --  93.4 91.9 92.5 94.2  MCH  --  33.3 33.6 33.9 33.7  MCHC  --  35.7 36.5* 36.7* 35.7  RDW  --  12.6 12.7 12.7  12.9  LYMPHSABS  --  1.4  --   --  0.8  MONOABS  --  0.9  --   --  0.6  EOSABS  --  0.0  --   --  0.0  BASOSABS  --  0.0  --   --  0.0    Recent Labs  Lab 04/16/24 0334 04/16/24 0531 04/16/24 0743 04/16/24 1101 04/16/24 1610 04/16/24 1844 04/16/24 2328 04/17/24 0807  NA 114*   < > 115* 118* 120* 119* 121* 124*  K  --   --  3.7  --  4.1 3.4* 3.5 3.7  CL  --   --  82*  --  89* 87* 91* 90*  CO2  --   --  20*  --  22 20* 21* 23  ANIONGAP  --   --  13  --  9 12 9 11   GLUCOSE  --   --  88  --  93 127* 104* 86  BUN  --   --  7*  --  6* 8 8 8   CREATININE  --   --  0.44*  --  0.57* 0.56* 0.59* 0.59*  AST 44*  --  35  --   --   --  35 32  ALT 18  --  16  --   --   --  15 17  ALKPHOS 73  --  66  --   --   --  67 71  BILITOT 1.5*  --  1.0  --   --   --  0.9 1.2  ALBUMIN 2.2*  --  2.0*  --   --   --  1.8* 2.2*  INR  --   --   --   --   --   --   --  1.0  TSH  --   --  0.880  --   --   --   --   --   HGBA1C  --   --   --  4.1*  --   --   --   --   AMMONIA  --   --   --   --   --   --   --  <13  MG 1.5*  --  3.2*  --   --   --  1.5*  --   PHOS  --   --  2.5  --   --   --   --  2.1*  CALCIUM   --   --  8.0*  --  8.2* 7.6* 7.8* 8.2*   < > = values in this interval not displayed.      Recent Labs  Lab 04/16/24 0334 04/16/24 0743 04/16/24 1101 04/16/24 1610 04/16/24 1844 04/16/24 2328 04/17/24 0807  INR  --   --   --   --   --   --  1.0  TSH  --  0.880  --   --   --   --   --   HGBA1C  --   --  4.1*  --   --   --   --   AMMONIA  --   --   --   --   --   --  <13  MG 1.5* 3.2*  --   --   --  1.5*  --   CALCIUM   --  8.0*  --  8.2* 7.6* 7.8* 8.2*    --------------------------------------------------------------------------------------------------------------- Lab Results  Component Value Date   CHOL 213 (H) 10/13/2020   HDL 106 10/13/2020   LDLCALC 93 10/13/2020   TRIG 84 10/13/2020   CHOLHDL 2.0 10/13/2020    Lab Results  Component Value Date   HGBA1C 4.1 (L) 04/16/2024    Recent Labs    04/16/24 0743  TSH 0.880  FREET4 1.34*   No results for input(s): VITAMINB12, FOLATE, FERRITIN, TIBC, IRON, RETICCTPCT in the last 72 hours. ------------------------------------------------------------------------------------------------------------------ Cardiac Enzymes No results for input(s): CKMB, TROPONINI, MYOGLOBIN in the last 168 hours.  Invalid input(s): CK  Micro Results No results found for this or any previous visit (from the past 240 hours).  Radiology Report DG Chest Port 1 View Result Date: 04/17/2024 EXAM: 1 VIEW XRAY OF THE CHEST 04/17/2024 06:22:11 AM COMPARISON: 02/21/2024 CLINICAL HISTORY: SOB (shortness of breath) [id number]. Per chart - Hypoglycemia; Alcohol Intoxication; Reason for exam - SOB FINDINGS: LUNGS AND PLEURA: Persistent pleural thickening over the left upper lobe. Persistent cavitary process within the left upper lobe measuring approximately 6.1 cm on today's study. Loculated air overlying the peripheral left upper lobe may reflect underlying bronchopulmonary fistula. New airspace disease is identified within the left base and right mid lung. No pleural effusion. HEART AND MEDIASTINUM: No acute abnormality of the cardiac and mediastinal silhouettes. BONES AND SOFT TISSUES: No acute osseous abnormality. Surgical clips at GE junction. IMPRESSION: 1. Persistent cavitary process within the left upper lobe, measuring approximately 6.1 cm, with associated pleural thickening. 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 2. New airspace disease within the left base and right mid lung. 3. No pleural effusion. Electronically signed by: Waddell Calk MD 04/17/2024 06:37 AM EDT RP Workstation: [id number]     Signature  -   Lavada Stank M.D on 04/17/2024 at 10:13 AM   -  To page go to [email or web address]

## 2024-04-18 DIAGNOSIS — F1020 Alcohol dependence, uncomplicated: Secondary | ICD-10-CM

## 2024-04-18 DIAGNOSIS — F1010 Alcohol abuse, uncomplicated: Secondary | ICD-10-CM | POA: Diagnosis not present

## 2024-04-18 DIAGNOSIS — E871 Hypo-osmolality and hyponatremia: Secondary | ICD-10-CM

## 2024-04-18 DIAGNOSIS — J984 Other disorders of lung: Secondary | ICD-10-CM

## 2024-04-18 DIAGNOSIS — Z5900 Homelessness unspecified: Secondary | ICD-10-CM

## 2024-04-18 DIAGNOSIS — J439 Emphysema, unspecified: Secondary | ICD-10-CM

## 2024-04-18 DIAGNOSIS — K7030 Alcoholic cirrhosis of liver without ascites: Secondary | ICD-10-CM

## 2024-04-18 DIAGNOSIS — D649 Anemia, unspecified: Secondary | ICD-10-CM

## 2024-04-18 DIAGNOSIS — R911 Solitary pulmonary nodule: Secondary | ICD-10-CM

## 2024-04-18 DIAGNOSIS — J449 Chronic obstructive pulmonary disease, unspecified: Secondary | ICD-10-CM

## 2024-04-18 DIAGNOSIS — L4050 Arthropathic psoriasis, unspecified: Secondary | ICD-10-CM

## 2024-04-18 DIAGNOSIS — A419 Sepsis, unspecified organism: Secondary | ICD-10-CM | POA: Diagnosis not present

## 2024-04-18 DIAGNOSIS — E46 Unspecified protein-calorie malnutrition: Secondary | ICD-10-CM

## 2024-04-18 DIAGNOSIS — E162 Hypoglycemia, unspecified: Secondary | ICD-10-CM

## 2024-04-18 DIAGNOSIS — Z86718 Personal history of other venous thrombosis and embolism: Secondary | ICD-10-CM

## 2024-04-18 DIAGNOSIS — M4802 Spinal stenosis, cervical region: Secondary | ICD-10-CM

## 2024-04-18 DIAGNOSIS — I7121 Aneurysm of the ascending aorta, without rupture: Secondary | ICD-10-CM

## 2024-04-18 DIAGNOSIS — F17200 Nicotine dependence, unspecified, uncomplicated: Secondary | ICD-10-CM

## 2024-04-18 LAB — GASTROINTESTINAL PANEL BY PCR, STOOL (REPLACES STOOL CULTURE)
Adenovirus F40/41: NOT DETECTED
Astrovirus: NOT DETECTED
Campylobacter species: NOT DETECTED
Cryptosporidium: NOT DETECTED
Cyclospora cayetanensis: NOT DETECTED
Entamoeba histolytica: NOT DETECTED
Enteroaggregative E coli (EAEC): NOT DETECTED
Enteropathogenic E coli (EPEC): DETECTED — AB
Enterotoxigenic E coli (ETEC): NOT DETECTED
Giardia lamblia: NOT DETECTED
Norovirus GI/GII: NOT DETECTED
Plesimonas shigelloides: NOT DETECTED
Rotavirus A: NOT DETECTED
Salmonella species: NOT DETECTED
Sapovirus (I, II, IV, and V): NOT DETECTED
Shiga like toxin producing E coli (STEC): NOT DETECTED
Shigella/Enteroinvasive E coli (EIEC): DETECTED — AB
Vibrio cholerae: NOT DETECTED
Vibrio species: NOT DETECTED
Yersinia enterocolitica: NOT DETECTED

## 2024-04-18 LAB — GLUCOSE, CAPILLARY
Glucose-Capillary: 182 mg/dL — ABNORMAL HIGH (ref 70–99)
Glucose-Capillary: 109 mg/dL — ABNORMAL HIGH (ref 70–99)
Glucose-Capillary: 173 mg/dL — ABNORMAL HIGH (ref 70–99)

## 2024-04-18 LAB — RESPIRATORY PANEL BY PCR

## 2024-04-18 LAB — COMPREHENSIVE METABOLIC PANEL WITH GFR
ALT: 13 U/L (ref 0–44)
AST: 22 U/L (ref 15–41)
Albumin: 1.7 g/dL — ABNORMAL LOW (ref 3.5–5.0)
Alkaline Phosphatase: 58 U/L (ref 38–126)
Anion gap: 8 (ref 5–15)
BUN: 5 mg/dL — ABNORMAL LOW (ref 8–23)
CO2: 22 mmol/L (ref 22–32)
Calcium: 7.7 mg/dL — ABNORMAL LOW (ref 8.9–10.3)
Chloride: 100 mmol/L (ref 98–111)
Creatinine, Ser: 0.56 mg/dL — ABNORMAL LOW (ref 0.61–1.24)
GFR, Estimated: >60 mL/min (ref >60)
Glucose, Bld: 101 mg/dL — ABNORMAL HIGH (ref 70–99)
Potassium: 3.3 mmol/L — ABNORMAL LOW (ref 3.5–5.1)
Sodium: 130 mmol/L — ABNORMAL LOW (ref 135–145)
Total Bilirubin: 0.8 mg/dL (ref 0.0–1.2)
Total Protein: 4.8 g/dL — ABNORMAL LOW (ref 6.5–8.1)

## 2024-04-18 LAB — CBC WITH DIFFERENTIAL/PLATELET
Abs Immature Granulocytes: 0.09 K/uL — ABNORMAL HIGH (ref 0.00–0.07)
Basophils Absolute: 0.1 K/uL (ref 0.0–0.1)
Basophils Relative: 0 %
Eosinophils Absolute: 0 K/uL (ref 0.0–0.5)
Eosinophils Relative: 0 %
HCT: 23.2 % — ABNORMAL LOW (ref 39.0–52.0)
Hemoglobin: 8.4 g/dL — ABNORMAL LOW (ref 13.0–17.0)
Immature Granulocytes: 1 %
Lymphocytes Relative: 9 %
Lymphs Abs: 1.3 K/uL (ref 0.7–4.0)
MCH: 34.4 pg — ABNORMAL HIGH (ref 26.0–34.0)
MCHC: 36.2 g/dL — ABNORMAL HIGH (ref 30.0–36.0)
MCV: 95.1 fL (ref 80.0–100.0)
Monocytes Absolute: 1.2 K/uL — ABNORMAL HIGH (ref 0.1–1.0)
Monocytes Relative: 8 %
Neutro Abs: 12.4 K/uL — ABNORMAL HIGH (ref 1.7–7.7)
Neutrophils Relative %: 82 %
Platelets: 202 K/uL (ref 150–400)
RBC: 2.44 MIL/uL — ABNORMAL LOW (ref 4.22–5.81)
RDW: 13.2 % (ref 11.5–15.5)
WBC: 15.2 K/uL — ABNORMAL HIGH (ref 4.0–10.5)
nRBC: 0 % (ref 0.0–0.2)

## 2024-04-18 LAB — AMMONIA: Ammonia: 27 umol/L (ref 9–35)

## 2024-04-18 LAB — SARS CORONAVIRUS 2 BY RT PCR: SARS Coronavirus 2 by RT PCR: NEGATIVE

## 2024-04-18 LAB — PHOSPHORUS: Phosphorus: 2.9 mg/dL (ref 2.5–4.6)

## 2024-04-18 LAB — MAGNESIUM: Magnesium: 1.8 mg/dL (ref 1.7–2.4)

## 2024-04-18 LAB — MRSA NEXT GEN BY PCR, NASAL: MRSA by PCR Next Gen: NOT DETECTED

## 2024-04-18 LAB — PROCALCITONIN: Procalcitonin: 6.72 ng/mL

## 2024-04-18 LAB — C-REACTIVE PROTEIN: CRP: 12.6 mg/dL — ABNORMAL HIGH (ref <1.0)

## 2024-04-18 MED ORDER — LACTATED RINGERS IV SOLN: INTRAVENOUS | Status: AC

## 2024-04-18 MED ORDER — AZITHROMYCIN 500 MG PO TABS
500.0000 mg | ORAL_TABLET | Freq: Every day | ORAL | Status: AC
Start: 2024-04-18 — End: 2024-04-25
  Administered 2024-04-18 – 2024-04-24 (×7): 500 mg via ORAL
  Filled 2024-04-18 (×7): qty 1

## 2024-04-18 MED ORDER — POTASSIUM CHLORIDE CRYS ER 20 MEQ PO TBCR
40.0000 meq | EXTENDED_RELEASE_TABLET | Freq: Once | ORAL | Status: AC
  Administered 2024-04-18: 40 meq via ORAL
  Filled 2024-04-18: qty 2

## 2024-04-18 NOTE — Progress Notes (Signed)
 Speech Language Pathology Treatment: Dysphagia  Patient Details Name: Cody Glenn MRN: [id number] DOB: March 14, 1956 Today's Date: 04/18/2024 Time: [id number] SLP Time Calculation (min) (ACUTE ONLY): 19 min  Assessment / Plan / Recommendation Clinical Impression  Pt's mentation has much improved from initial swallow assessment yesterday. He is conversational, does not appear confused and able to self feed. He is edentulous and ate regular textures prior to admission. Pt able to masticate graham crackers (has been eating them this am per tech) with timely mastication and consistently cleared oral cavity as well as peaches. No s/s aspiration with straw sips thin. Discussed diet textures and he agrees he should be able to masticate regular textures without difficulty and will cut meats up as needed. SLP upgraded diet to regular and assisted in ordering lunch. Continue thin liquids, pills with thin and no further ST is warranted.    HPI HPI: Cody Glenn is a 68 yo male presenting to ED 9/8 after being found down in a parking lot with hypoglycemia, hyponatremia, and dehydration. CXR shows LUL cavitary process with associated pleural thickening and new airspace disease within the L base and R mid lung. CT Chest pending. PMH includes daily alcohol use, DVT, PUD, history of partial gastrectomy, current tobacco use, cervical spinal stenosis, primary hypertension, and homelessness      SLP Plan  All goals met;Discharge SLP treatment due to (comment)          Recommendations  Diet recommendations: Regular;Thin liquid Liquids provided via: Cup;Straw Medication Administration: Whole meds with liquid Supervision: Patient able to self feed Compensations: Slow rate;Small sips/bites Postural Changes and/or Swallow Maneuvers: Seated upright 90 degrees                  Oral care BID   None Dysphagia, unspecified (R13.10)     All goals met;Discharge SLP treatment due to (comment)      Dustin Olam Bull  04/18/2024, 1:30 PM

## 2024-04-18 NOTE — Consult Note (Addendum)
 Reason for Consult: Left upper lobe lung cavitary lesion Referring Physician: Dorn Chill MD  Cody Glenn is an 68 y.o. male.  HPI: The patient is a 68 year old male we are asked to consult on for a left upper lobe lung cavitary mass.  Patient presented to the emergency department after being found down by a bystander.  He was initially found by EMS to be hypoglycemic.  He was recently treated in the hospital for pneumonia.  He was noted to have complaints of significant cough with hemoptysis.  He has significant past medical history including alcohol abuse with cirrhosis.  He was admitted with beer Potomania and hyponatremia.  He also has a significant history of 40-pack-year tobacco use.  Please see the active problem list for further list of current active diagnoses.  He was admitted for further evaluation and treatment.  His workup has also included a CT angiogram of his chest on 04/17/2024.  Findings are consistent with progressive left upper lobe cavitary pneumonia.  He was noted to have trace bilateral pleural effusions and findings consistent with emphysema.  There is also worsening ground glass airspace disease within the lingula and left upper lobe compatible with worsening infection.  He was severely hyponatremic at presentation and nephrology consultation was obtained to assist with management.  Pulmonary medicine was also consulted.  He reportedly completed a 3-week course of Augmentin  following the initial hospitalization.  He was placed on intravenous Unasyn  at time of admission.  He had a previous bronchoscopy on 02/21/2024 which was negative for carcinoma and negative fungal cultures.  Pulmonology is considering possible redo bronchoscope.  They are asking our opinion as to whether lobectomy would be an option for this patient.  Past Medical History:  Diagnosis Date   Alcohol use    Hyperbilirubinemia 07/01/2020   Peptic ulcer 08/09/1987   Jul 28, 2008 Entered By:  KATHERINE ETHA HERO Comment: s/p partial gastrectomy    Past Surgical History:  Procedure Laterality Date   FLEXIBLE BRONCHOSCOPY Left 02/21/2024   Procedure: BRONCHOSCOPY, FLEXIBLE;  Surgeon: Shelah Lamar RAMAN, MD;  Location: [facility];  Service: Cardiopulmonary;  Laterality: Left;   ulcer surgery in stomach     pt states he had this approx 33 years ago    Patient Active Problem List   Diagnosis Date Noted   Protein-calorie malnutrition, severe 04/17/2024   Hypocalcemia 04/16/2024   Hypomagnesemia 04/16/2024   Hyponatremia 02/20/2024   Normocytic anemia 02/20/2024   Mild protein malnutrition (HCC) 02/20/2024   Acute respiratory failure with hypoxia (HCC) 02/20/2024   Emphysema/COPD (HCC) 02/20/2024   Cavitary lesion of lung 02/20/2024   Ascending aortic aneurysm (HCC) 02/20/2024   Coronary artery calcification 02/20/2024   Aortic atherosclerosis (HCC) 02/20/2024   Closed fracture of eight ribs of right side 02/20/2024   DVT of lower extremity, bilateral (HCC) 02/20/2024   Osteoarthritis 01/21/2024   Prediabetes 01/21/2024   Problem related to housing and economic circumstances, unspecified 01/21/2024   Other B-complex deficiencies 10/13/2020   Sheltered homelessness 10/13/2020   Primary hypertension 10/13/2020   Cervical stenosis of spine 08/24/2020   Alcohol use 06/29/2020   Tobacco use 06/29/2020   Peptic ulcer without hemorrhage, perforation, or obstruction 08/09/1987     History reviewed. No pertinent family history.  Social History:  reports that he has been smoking cigarettes. He has a 40 pack-year smoking history. He has never used smokeless tobacco. He reports current alcohol use. He reports that he does not currently  use drugs.  Allergies:  Allergies  Allergen Reactions   Lactose Intolerance (Gi)     Per pt report    Medications:  Current Facility-Administered Medications:    acetaminophen  (TYLENOL ) tablet 650 mg, 650 mg, Oral, Q6H PRN, 650 mg at 04/18/24  0032 **OR** acetaminophen  (TYLENOL ) suppository 650 mg, 650 mg, Rectal, Q6H PRN, Foust, Katy L, NP   albuterol  (PROVENTIL ) (2.5 MG/3ML) 0.083% nebulizer solution 2.5 mg, 2.5 mg, Nebulization, Q4H PRN, Foust, Katy L, NP   Ampicillin -Sulbactam (UNASYN ) 3 g in sodium chloride  0.9 % 100 mL IVPB, 3 g, Intravenous, Q6H, Hammons, Kimberly B, RPH, Last Rate: 200 mL/hr at 04/18/24 1232, 3 g at 04/18/24 1232   azithromycin  (ZITHROMAX ) tablet 500 mg, 500 mg, Oral, Daily, Singh, Prashant K, MD, 500 mg at 04/18/24 1224   dextrose  50 % solution 50 mL, 1 ampule, Intravenous, PRN, Sundil, Subrina, MD   feeding supplement (BOOST / RESOURCE BREEZE) liquid 1 Container, 1 Container, Oral, TID BM, Singh, Prashant K, MD, 1 Container at 04/18/24 0941   folic acid  (FOLVITE ) tablet 1 mg, 1 mg, Oral, Daily, Agbata, Tochukwu, MD, 1 mg at 04/18/24 0858   heparin  injection 5,000 Units, 5,000 Units, Subcutaneous, Q8H, Singh, Prashant K, MD, 5,000 Units at 04/18/24 0559   ketorolac  (TORADOL ) 15 MG/ML injection 15 mg, 15 mg, Intravenous, Q6H PRN, Foust, Katy L, NP, 15 mg at 04/17/24 0042   lactated ringers  infusion, , Intravenous, Continuous, Dennise Lavada POUR, MD, Stopped at 04/18/24 0557   lidocaine  (LIDODERM ) 5 % 1 patch, 1 patch, Transdermal, Q24H, Foust, Katy L, NP, 1 patch at 04/18/24 0856   LORazepam  (ATIVAN ) tablet 1-4 mg, 1-4 mg, Oral, Q1H PRN **OR** LORazepam  (ATIVAN ) injection 1-4 mg, 1-4 mg, Intravenous, Q1H PRN, Agbata, Tochukwu, MD   melatonin tablet 5 mg, 5 mg, Oral, QHS PRN, Foust, Katy L, NP, 5 mg at 04/17/24 0042   multivitamin with minerals tablet 1 tablet, 1 tablet, Oral, Daily, Agbata, Tochukwu, MD, 1 tablet at 04/18/24 0858   nicotine  (NICODERM CQ  - dosed in mg/24 hours) patch 14 mg, 14 mg, Transdermal, Daily, Foust, Katy L, NP, 14 mg at 04/18/24 0900   nicotine  polacrilex (NICORETTE ) gum 2 mg, 2 mg, Oral, PRN, Foust, Katy L, NP   ondansetron  (ZOFRAN ) tablet 4 mg, 4 mg, Oral, Q6H PRN **OR** ondansetron   (ZOFRAN ) injection 4 mg, 4 mg, Intravenous, Q6H PRN, Foust, Katy L, NP   pantoprazole  (PROTONIX ) EC tablet 40 mg, 40 mg, Oral, Daily, Foust, Katy L, NP, 40 mg at 04/18/24 0858   polyethylene glycol (MIRALAX  / GLYCOLAX ) packet 17 g, 17 g, Oral, Daily PRN, Foust, Katy L, NP   potassium chloride  SA (KLOR-CON  M) CR tablet 40 mEq, 40 mEq, Oral, BID, Singh, Prashant K, MD, 40 mEq at 04/18/24 0858   sodium chloride  tablet 1 g, 1 g, Oral, TID WC, Patel, Pranav M, MD, 1 g at 04/18/24 1224   thiamine  (VITAMIN B1) tablet 100 mg, 100 mg, Oral, Daily, 100 mg at 04/18/24 0858 **OR** thiamine  (VITAMIN B1) injection 100 mg, 100 mg, Intravenous, Daily, Agbata, Tochukwu, MD   Results for orders placed or performed during the hospital encounter of 04/15/24 (from the past 48 hours)  CBG monitoring, ED     Status: Abnormal   Collection Time: 04/16/24 11:47 AM  Result Value Ref Range   Glucose-Capillary 114 (H) 70 - 99 mg/dL    Comment: Glucose reference range applies only to samples taken after fasting for at least 8 hours.  Glucose, capillary  Status: Abnormal   Collection Time: 04/16/24  3:56 PM  Result Value Ref Range   Glucose-Capillary 106 (H) 70 - 99 mg/dL    Comment: Glucose reference range applies only to samples taken after fasting for at least 8 hours.  Basic metabolic panel     Status: Abnormal   Collection Time: 04/16/24  4:10 PM  Result Value Ref Range   Sodium 120 (L) 135 - 145 mmol/L   Potassium 4.1 3.5 - 5.1 mmol/L   Chloride 89 (L) 98 - 111 mmol/L   CO2 22 22 - 32 mmol/L   Glucose, Bld 93 70 - 99 mg/dL    Comment: Glucose reference range applies only to samples taken after fasting for at least 8 hours.   BUN 6 (L) 8 - 23 mg/dL   Creatinine, Ser 9.42 (L) 0.61 - 1.24 mg/dL   Calcium  8.2 (L) 8.9 - 10.3 mg/dL   GFR, Estimated >39 >39 mL/min    Comment: (NOTE) Calculated using the CKD-EPI Creatinine Equation (2021)    Anion gap 9 5 - 15    Comment: Performed at [facility],  [address] [place], [place] [address]  Basic metabolic panel     Status: Abnormal   Collection Time: 04/16/24  6:44 PM  Result Value Ref Range   Sodium 119 (LL) 135 - 145 mmol/L    Comment: CRITICAL RESULT CALLED TO, READ BACK BY AND VERIFIED WITH C. TIMMONS RN @1953  ON 04/16/24 MAB   Potassium 3.4 (L) 3.5 - 5.1 mmol/L   Chloride 87 (L) 98 - 111 mmol/L   CO2 20 (L) 22 - 32 mmol/L   Glucose, Bld 127 (H) 70 - 99 mg/dL    Comment: Glucose reference range applies only to samples taken after fasting for at least 8 hours.   BUN 8 8 - 23 mg/dL   Creatinine, Ser 9.43 (L) 0.61 - 1.24 mg/dL   Calcium  7.6 (L) 8.9 - 10.3 mg/dL   GFR, Estimated >39 >39 mL/min    Comment: (NOTE) Calculated using the CKD-EPI Creatinine Equation (2021)    Anion gap 12 5 - 15    Comment: Performed at [facility], [address] [place], [place] [address]  Glucose, capillary     Status: Abnormal   Collection Time: 04/16/24  9:42 PM  Result Value Ref Range   Glucose-Capillary 157 (H) 70 - 99 mg/dL    Comment: Glucose reference range applies only to samples taken after fasting for at least 8 hours.  CBC     Status: Abnormal   Collection Time: 04/16/24 11:28 PM  Result Value Ref Range   WBC 10.2 4.0 - 10.5 K/uL   RBC 2.80 (L) 4.22 - 5.81 MIL/uL   Hemoglobin 9.5 (L) 13.0 - 17.0 g/dL   HCT 74.0 (L) 60.9 - 47.9 %   MCV 92.5 80.0 - 100.0 fL   MCH 33.9 26.0 - 34.0 pg   MCHC 36.7 (H) 30.0 - 36.0 g/dL   RDW 87.2 88.4 - 84.4 %   Platelets 261 150 - 400 K/uL   nRBC 0.0 0.0 - 0.2 %    Comment: Performed at [facility], [address] [place], [place] [address]  Comprehensive metabolic panel     Status: Abnormal   Collection Time: 04/16/24 11:28 PM  Result Value Ref Range   Sodium 121 (L) 135 - 145 mmol/L   Potassium 3.5 3.5 - 5.1 mmol/L   Chloride 91 (L) 98 -  111 mmol/L   CO2 21 (L) 22 - 32 mmol/L   Glucose, Bld 104 (H) 70 - 99 mg/dL    Comment: Glucose reference range applies only to samples taken  after fasting for at least 8 hours.   BUN 8 8 - 23 mg/dL   Creatinine, Ser 9.40 (L) 0.61 - 1.24 mg/dL   Calcium  7.8 (L) 8.9 - 10.3 mg/dL   Total Protein 4.6 (L) 6.5 - 8.1 g/dL   Albumin 1.8 (L) 3.5 - 5.0 g/dL   AST 35 15 - 41 U/L   ALT 15 0 - 44 U/L   Alkaline Phosphatase 67 38 - 126 U/L   Total Bilirubin 0.9 0.0 - 1.2 mg/dL   GFR, Estimated >39 >39 mL/min    Comment: (NOTE) Calculated using the CKD-EPI Creatinine Equation (2021)    Anion gap 9 5 - 15    Comment: Performed at [facility], [address], [place], [place] [address]  Magnesium      Status: Abnormal   Collection Time: 04/16/24 11:28 PM  Result Value Ref Range   Magnesium  1.5 (L) 1.7 - 2.4 mg/dL    Comment: Performed at [facility], [address], [place], [place] [address]  Cortisol-am, blood     Status: None   Collection Time: 04/16/24 11:28 PM  Result Value Ref Range   Cortisol - AM 10.8 6.7 - 22.6 ug/dL    Comment: Performed at [facility], [address], [place], [place] [address]  Glucose, capillary     Status: Abnormal   Collection Time: 04/16/24 11:57 PM  Result Value Ref Range   Glucose-Capillary 120 (H) 70 - 99 mg/dL    Comment: Glucose reference range applies only to samples taken after fasting for at least 8 hours.  Glucose, capillary     Status: Abnormal   Collection Time: 04/17/24  3:41 AM  Result Value Ref Range   Glucose-Capillary 110 (H) 70 - 99 mg/dL    Comment: Glucose reference range applies only to samples taken after fasting for at least 8 hours.  Osmolality, urine     Status: None   Collection Time: 04/17/24  8:00 AM  Result Value Ref Range   Osmolality, Ur 339 300 - 900 mOsm/kg    Comment: Performed at [facility], [address], [place], [place] [address]  Sodium, urine, random     Status: None   Collection Time: 04/17/24  8:00 AM  Result Value Ref Range   Sodium, Ur <30 mmol/L    Comment: Performed at [facility], [address], [place],  [place] [address]  Creatinine, urine, random     Status: None   Collection Time: 04/17/24  8:00 AM  Result Value Ref Range   Creatinine, Urine 121 mg/dL    Comment: Performed at [facility], [address], [place], [place] [address]  Uric acid     Status: None   Collection Time: 04/17/24  8:07 AM  Result Value Ref Range   Uric Acid, Serum 4.7 3.7 - 8.6 mg/dL    Comment: Performed at [facility], [address], [place], [place] [address]  Osmolality     Status: Abnormal   Collection Time: 04/17/24  8:07 AM  Result Value Ref Range   Osmolality 256 (L) 275 - 295 mOsm/kg    Comment: Performed at [facility], [address], [place], [place] [address]  CBC with Differential/Platelet  Status: Abnormal   Collection Time: 04/17/24  8:07 AM  Result Value Ref Range   WBC 12.6 (H) 4.0 - 10.5 K/uL   RBC 3.12 (L) 4.22 - 5.81 MIL/uL   Hemoglobin 10.5 (L) 13.0 - 17.0 g/dL   HCT 70.5 (L) 60.9 - 47.9 %   MCV 94.2 80.0 - 100.0 fL   MCH 33.7 26.0 - 34.0 pg   MCHC 35.7 30.0 - 36.0 g/dL   RDW 87.0 88.4 - 84.4 %   Platelets 288 150 - 400 K/uL   nRBC 0.0 0.0 - 0.2 %   Neutrophils Relative % 88 %   Neutro Abs 11.0 (H) 1.7 - 7.7 K/uL   Lymphocytes Relative 6 %   Lymphs Abs 0.8 0.7 - 4.0 K/uL   Monocytes Relative 5 %   Monocytes Absolute 0.6 0.1 - 1.0 K/uL   Eosinophils Relative 0 %   Eosinophils Absolute 0.0 0.0 - 0.5 K/uL   Basophils Relative 0 %   Basophils Absolute 0.0 0.0 - 0.1 K/uL   Immature Granulocytes 1 %   Abs Immature Granulocytes 0.11 (H) 0.00 - 0.07 K/uL    Comment: Performed at [facility], [address], [place], [place] [address]  Phosphorus     Status: Abnormal   Collection Time: 04/17/24  8:07 AM  Result Value Ref Range   Phosphorus 2.1 (L) 2.5 - 4.6 mg/dL    Comment: Performed at [facility], [address], [place], [place] [address]  Comprehensive metabolic panel with GFR     Status: Abnormal   Collection Time: 04/17/24  8:07 AM  Result  Value Ref Range   Sodium 124 (L) 135 - 145 mmol/L   Potassium 3.7 3.5 - 5.1 mmol/L   Chloride 90 (L) 98 - 111 mmol/L   CO2 23 22 - 32 mmol/L   Glucose, Bld 86 70 - 99 mg/dL    Comment: Glucose reference range applies only to samples taken after fasting for at least 8 hours.   BUN 8 8 - 23 mg/dL   Creatinine, Ser 9.40 (L) 0.61 - 1.24 mg/dL   Calcium  8.2 (L) 8.9 - 10.3 mg/dL   Total Protein 5.9 (L) 6.5 - 8.1 g/dL   Albumin 2.2 (L) 3.5 - 5.0 g/dL   AST 32 15 - 41 U/L   ALT 17 0 - 44 U/L   Alkaline Phosphatase 71 38 - 126 U/L   Total Bilirubin 1.2 0.0 - 1.2 mg/dL   GFR, Estimated >39 >39 mL/min    Comment: (NOTE) Calculated using the CKD-EPI Creatinine Equation (2021)    Anion gap 11 5 - 15    Comment: Performed at [facility], [address], [place], [place] [address]  Ammonia     Status: None   Collection Time: 04/17/24  8:07 AM  Result Value Ref Range   Ammonia <13 9 - 35 umol/L    Comment: Performed at [facility], [address] [place], [place] [address]  Protime-INR     Status: None   Collection Time: 04/17/24  8:07 AM  Result Value Ref Range   Prothrombin Time 14.1 11.4 - 15.2 seconds   INR 1.0 0.8 - 1.2    Comment: (NOTE) INR goal varies based on device and disease states. Performed at [facility], [address] [place], [place] [address]   Glucose, capillary     Status: None   Collection Time: 04/17/24  9:14 AM  Result  Value Ref Range   Glucose-Capillary 85 70 - 99 mg/dL    Comment: Glucose reference range applies only to samples taken after fasting for at least 8 hours.  Culture, blood (Routine X 2) w Reflex to ID Panel     Status: None (Preliminary result)   Collection Time: 04/17/24 10:55 AM   Specimen: BLOOD LEFT ARM  Result Value Ref Range   Specimen Description BLOOD LEFT ARM    Special Requests      BOTTLES DRAWN AEROBIC AND ANAEROBIC Blood Culture results may not be optimal due to an inadequate volume of blood received in culture  bottles   Culture      NO GROWTH < 12 HOURS Performed at [facility], [address], [place], [place] [address]    Report Status PENDING   Culture, blood (Routine X 2) w Reflex to ID Panel     Status: None (Preliminary result)   Collection Time: 04/17/24 10:59 AM   Specimen: BLOOD  Result Value Ref Range   Specimen Description BLOOD SITE NOT SPECIFIED    Special Requests      BOTTLES DRAWN AEROBIC AND ANAEROBIC Blood Culture results may not be optimal due to an inadequate volume of blood received in culture bottles   Culture      NO GROWTH < 24 HOURS Performed at [facility], [address] [place], [place] [address]    Report Status PENDING   TSH     Status: None   Collection Time: 04/17/24 10:59 AM  Result Value Ref Range   TSH 0.639 0.350 - 4.500 uIU/mL    Comment: Performed by a 3rd Generation assay with a functional sensitivity of <=0.01 uIU/mL. Performed at [facility], [address], [place], [place] [address]   Cortisol     Status: None   Collection Time: 04/17/24 10:59 AM  Result Value Ref Range   Cortisol, Plasma 19.5 ug/dL    Comment: (NOTE) AM    6.7 - 22.6 ug/dL PM   <89.9       ug/dL Performed at [facility], [address], [place], [place] [address]   Glucose, capillary     Status: Abnormal   Collection Time: 04/17/24 12:04 PM  Result Value Ref Range   Glucose-Capillary 140 (H) 70 - 99 mg/dL    Comment: Glucose reference range applies only to samples taken after fasting for at least 8 hours.  Basic metabolic panel     Status: Abnormal   Collection Time: 04/17/24  2:42 PM  Result Value Ref Range   Sodium 121 (L) 135 - 145 mmol/L   Potassium 3.1 (L) 3.5 - 5.1 mmol/L   Chloride 94 (L) 98 - 111 mmol/L   CO2 18 (L) 22 - 32 mmol/L   Glucose, Bld 216 (H) 70 - 99 mg/dL    Comment: Glucose reference range applies only to samples taken after fasting for at least 8 hours.   BUN 7 (L) 8 - 23 mg/dL   Creatinine, Ser 9.24 0.61 - 1.24  mg/dL   Calcium  7.4 (L) 8.9 - 10.3 mg/dL   GFR, Estimated >39 >39 mL/min    Comment: (NOTE) Calculated using the CKD-EPI Creatinine Equation (2021)    Anion gap 9 5 - 15    Comment: Performed at [facility], [address] [place], [place] [address]  Magnesium      Status: None   Collection Time: 04/17/24  2:42 PM  Result Value Ref Range   Magnesium  2.0 1.7 - 2.4 mg/dL    Comment: Performed at [facility], [address], [place], [place] [address]  HIV Antibody (routine testing w rflx)     Status: None   Collection Time: 04/17/24  2:42 PM  Result Value Ref Range   HIV Screen 4th Generation wRfx Non Reactive Non Reactive    Comment: Performed at [facility], [address], [place], [place] [address]  T4, free     Status: Abnormal   Collection Time: 04/17/24  2:42 PM  Result Value Ref Range   Free T4 1.20 (H) 0.61 - 1.12 ng/dL    Comment: (NOTE) Biotin ingestion may interfere with free T4 tests. If the results are inconsistent with the TSH level, previous test results, or the clinical presentation, then consider biotin interference. If needed, order repeat testing after stopping biotin. Performed at [facility], [address], [place], [place] [address]   Glucose, capillary     Status: Abnormal   Collection Time: 04/17/24  5:45 PM  Result Value Ref Range   Glucose-Capillary 109 (H) 70 - 99 mg/dL    Comment: Glucose reference range applies only to samples taken after fasting for at least 8 hours.  Basic metabolic panel     Status: Abnormal   Collection Time: 04/17/24  8:13 PM  Result Value Ref Range   Sodium 126 (L) 135 - 145 mmol/L   Potassium 3.5 3.5 - 5.1 mmol/L   Chloride 96 (L) 98 - 111 mmol/L   CO2 19 (L) 22 - 32 mmol/L   Glucose, Bld 97 70 - 99 mg/dL    Comment: Glucose reference range applies only to samples taken after fasting for at least 8 hours.   BUN 7 (L) 8 - 23 mg/dL   Creatinine, Ser 9.29 0.61 - 1.24 mg/dL   Calcium  7.4 (L) 8.9  - 10.3 mg/dL   GFR, Estimated >39 >39 mL/min    Comment: (NOTE) Calculated using the CKD-EPI Creatinine Equation (2021)    Anion gap 11 5 - 15    Comment: Performed at [facility], [address], [place], [place] [address]  Glucose, capillary     Status: Abnormal   Collection Time: 04/17/24 11:13 PM  Result Value Ref Range   Glucose-Capillary 120 (H) 70 - 99 mg/dL    Comment: Glucose reference range applies only to samples taken after fasting for at least 8 hours.  Gastrointestinal Panel by PCR , Stool     Status: Abnormal   Collection Time: 04/18/24 12:38 AM   Specimen: Stool  Result Value Ref Range   Campylobacter species NOT DETECTED NOT DETECTED   Plesimonas shigelloides NOT DETECTED NOT DETECTED   Salmonella species NOT DETECTED NOT DETECTED   Yersinia enterocolitica NOT DETECTED NOT DETECTED   Vibrio species NOT DETECTED NOT DETECTED   Vibrio cholerae NOT DETECTED NOT DETECTED   Enteroaggregative E coli (EAEC) NOT DETECTED NOT DETECTED   Enteropathogenic E coli (EPEC) DETECTED (A) NOT DETECTED    Comment: RESULT CALLED TO, READ BACK BY AND VERIFIED WITH: KEDRIC DARING RN [phone] 04/18/24 HNM    Enterotoxigenic E coli (ETEC) NOT DETECTED NOT DETECTED   Shiga like toxin producing E coli (STEC) NOT DETECTED NOT DETECTED   Shigella/Enteroinvasive E coli (EIEC) DETECTED (A) NOT DETECTED    Comment: RESULT CALLED TO, READ BACK BY AND VERIFIED WITHBETHA KEDRIC DARING RN [phone] 04/18/24 HNM  Cryptosporidium NOT DETECTED NOT DETECTED   Cyclospora cayetanensis NOT DETECTED NOT DETECTED   Entamoeba histolytica NOT DETECTED NOT DETECTED   Giardia lamblia NOT DETECTED NOT DETECTED   Adenovirus F40/41 NOT DETECTED NOT DETECTED   Astrovirus NOT DETECTED NOT DETECTED   Norovirus GI/GII NOT DETECTED NOT DETECTED   Rotavirus A NOT DETECTED NOT DETECTED   Sapovirus (I, II, IV, and V) NOT DETECTED NOT DETECTED    Comment: Performed at [facility], [address]  [place], [place] [address]  CBC with Differential/Platelet     Status: Abnormal   Collection Time: 04/18/24  2:53 AM  Result Value Ref Range   WBC 15.2 (H) 4.0 - 10.5 K/uL   RBC 2.44 (L) 4.22 - 5.81 MIL/uL   Hemoglobin 8.4 (L) 13.0 - 17.0 g/dL   HCT 76.7 (L) 60.9 - 47.9 %   MCV 95.1 80.0 - 100.0 fL   MCH 34.4 (H) 26.0 - 34.0 pg   MCHC 36.2 (H) 30.0 - 36.0 g/dL   RDW 86.7 88.4 - 84.4 %   Platelets 202 150 - 400 K/uL   nRBC 0.0 0.0 - 0.2 %   Neutrophils Relative % 82 %   Neutro Abs 12.4 (H) 1.7 - 7.7 K/uL   Lymphocytes Relative 9 %   Lymphs Abs 1.3 0.7 - 4.0 K/uL   Monocytes Relative 8 %   Monocytes Absolute 1.2 (H) 0.1 - 1.0 K/uL   Eosinophils Relative 0 %   Eosinophils Absolute 0.0 0.0 - 0.5 K/uL   Basophils Relative 0 %   Basophils Absolute 0.1 0.0 - 0.1 K/uL   Immature Granulocytes 1 %   Abs Immature Granulocytes 0.09 (H) 0.00 - 0.07 K/uL    Comment: Performed at [facility], [address], [place], [place] [address]  Comprehensive metabolic panel with GFR     Status: Abnormal   Collection Time: 04/18/24  2:53 AM  Result Value Ref Range   Sodium 130 (L) 135 - 145 mmol/L   Potassium 3.3 (L) 3.5 - 5.1 mmol/L   Chloride 100 98 - 111 mmol/L   CO2 22 22 - 32 mmol/L   Glucose, Bld 101 (H) 70 - 99 mg/dL    Comment: Glucose reference range applies only to samples taken after fasting for at least 8 hours.   BUN 5 (L) 8 - 23 mg/dL   Creatinine, Ser 9.43 (L) 0.61 - 1.24 mg/dL   Calcium  7.7 (L) 8.9 - 10.3 mg/dL   Total Protein 4.8 (L) 6.5 - 8.1 g/dL   Albumin 1.7 (L) 3.5 - 5.0 g/dL   AST 22 15 - 41 U/L   ALT 13 0 - 44 U/L   Alkaline Phosphatase 58 38 - 126 U/L   Total Bilirubin 0.8 0.0 - 1.2 mg/dL   GFR, Estimated >39 >39 mL/min    Comment: (NOTE) Calculated using the CKD-EPI Creatinine Equation (2021)    Anion gap 8 5 - 15    Comment: Performed at [facility], [address] [place], [place] [address]  Magnesium      Status: None   Collection Time: 04/18/24  2:53  AM  Result Value Ref Range   Magnesium  1.8 1.7 - 2.4 mg/dL    Comment: Performed at [facility], [address] [place], [place] [address]  Ammonia     Status: None   Collection Time: 04/18/24  2:53 AM  Result Value Ref Range   Ammonia 27 9 - 35 umol/L  Comment: Performed at [facility], [address], [place], [place] [address]  Phosphorus     Status: None   Collection Time: 04/18/24  2:53 AM  Result Value Ref Range   Phosphorus 2.9 2.5 - 4.6 mg/dL    Comment: Performed at [facility], [address] [place], [place] [address]  C-reactive protein     Status: Abnormal   Collection Time: 04/18/24  2:53 AM  Result Value Ref Range   CRP 12.6 (H) <1.0 mg/dL    Comment: Performed at [facility], [address] [place], [place] [address]  Procalcitonin     Status: None   Collection Time: 04/18/24  2:53 AM  Result Value Ref Range   Procalcitonin 6.72 ng/mL    Comment:        Interpretation: PCT > 2 ng/mL: Systemic infection (sepsis) is likely, unless other causes are known. (NOTE)       Sepsis PCT Algorithm           Lower Respiratory Tract                                      Infection PCT Algorithm    ----------------------------     ----------------------------         PCT < 0.25 ng/mL                PCT < 0.10 ng/mL          Strongly encourage             Strongly discourage   discontinuation of antibiotics    initiation of antibiotics    ----------------------------     -----------------------------       PCT 0.25 - 0.50 ng/mL            PCT 0.10 - 0.25 ng/mL               OR       >80% decrease in PCT            Discourage initiation of                                            antibiotics      Encourage discontinuation           of antibiotics    ----------------------------     -----------------------------         PCT >= 0.50 ng/mL              PCT 0.26 - 0.50 ng/mL               AND       <80% decrease in PCT              Encourage  initiation of                                             antibiotics       Encourage continuation           of antibiotics    ----------------------------     -----------------------------        PCT >= 0.50  ng/mL                  PCT > 0.50 ng/mL               AND         increase in PCT                  Strongly encourage                                      initiation of antibiotics    Strongly encourage escalation           of antibiotics                                     -----------------------------                                           PCT <= 0.25 ng/mL                                                 OR                                        > 80% decrease in PCT                                      Discontinue / Do not initiate                                             antibiotics  Performed at [facility], [address], [place], [place] [address]   MRSA Next Gen by PCR, Nasal     Status: None   Collection Time: 04/18/24  5:45 AM   Specimen: Nasal Mucosa; Nasal Swab  Result Value Ref Range   MRSA by PCR Next Gen NOT DETECTED NOT DETECTED    Comment: (NOTE) The GeneXpert MRSA Assay (FDA approved for NASAL specimens only), is one component of a comprehensive MRSA colonization surveillance program. It is not intended to diagnose MRSA infection nor to guide or monitor treatment for MRSA infections. Test performance is not FDA approved in patients less than 38 years old. Performed at [facility], [address], [place], [place] [address]   Glucose, capillary     Status: Abnormal   Collection Time: 04/18/24  7:00 AM  Result Value Ref Range   Glucose-Capillary 182 (H) 70 - 99 mg/dL    Comment: Glucose reference range applies only to samples taken after fasting for at least 8 hours.    CT CHEST W CONTRAST Result Date: 04/17/2024 CLINICAL DATA:  Pneumonia, complication suspected, xray done, short of breath EXAM: CT CHEST WITH CONTRAST TECHNIQUE:  Multidetector CT imaging of the chest  was performed during intravenous contrast administration. RADIATION DOSE REDUCTION: This exam was performed according to the departmental dose-optimization program which includes automated exposure control, adjustment of the mA and/or kV according to patient size and/or use of iterative reconstruction technique. CONTRAST:  50mL OMNIPAQUE  IOHEXOL  350 MG/ML SOLN COMPARISON:  04/17/2024, 02/26/2024 FINDINGS: Cardiovascular: The heart is unremarkable without pericardial effusion. 4.1 cm ascending thoracic aortic aneurysm. No evidence of dissection. Atherosclerosis of the aorta and coronary vasculature again noted. Mediastinum/Nodes: Stable borderline enlarged mediastinal and left hilar lymph nodes, largest measuring up to 9 mm in short axis. Thyroid , trachea, and esophagus are unremarkable. Lungs/Pleura: Trace bilateral pleural effusions, left greater than right. There is a large thick walled cavitary lesion within the left upper lobe, increased in size since prior study, measuring 11.7 x 6.6 x 4.9 cm. The mural thickening is greatest along the inferior aspect of this lesion, with a small gas fluid level identified. There is direct communication between a left lower lobe subsegmental bronchus and this thick walled cavitary lesion, reference image 57 and 58 of series 4. Upper lobe predominant emphysema again noted. There are bibasilar areas of subpleural scarring and fibrosis unchanged. Progressive ground-glass airspace disease within the left lower lobe and lingula since prior exam, compatible with worsening inflammation or infection no evidence of pneumothorax. Upper Abdomen: No acute abnormality. Musculoskeletal: No acute or destructive bony abnormalities. Reconstructed images demonstrate no additional findings. IMPRESSION: 1. Increasing size of the thick walled cavitary lesion within the left upper lobe, consistent with progressive cavitary pneumonia. While the soft tissue nodule  within the inferior aspect of the lesion is no longer visualized, there has been marked progression in the mural thickening seen along the inferior extent, consistent with progressive infection. There is communication between this cavitary lesion and a subsegmental left upper lobe bronchus as described above. 2. Worsening ground-glass airspace disease within the lingula and left lower lobe, compatible with worsening infection. 3. Trace bilateral pleural effusions, left greater than right. 4. Aortic Atherosclerosis (ICD10-I70.0) and Emphysema (ICD10-J43.9). Electronically Signed   By: Ozell Daring M.D.   On: 04/17/2024 15:39   DG Abd Portable 1V Result Date: 04/17/2024 CLINICAL DATA:  Nausea, hypoglycemia EXAM: PORTABLE ABDOMEN - 1 VIEW COMPARISON:  Overlapping portions CT chest 02/26/2024 FINDINGS: Notable lower thoracic and lumbar spondylosis. Atherosclerosis is present, including aortoiliac atherosclerotic disease. Moderate degenerative arthropathy of both hips. Scattered gas in small and large bowel without overtly dilated bowel identified. IMPRESSION: 1. Scattered gas in small and large bowel without overtly dilated bowel identified. 2. Lower thoracic and lumbar spondylosis. 3. Moderate degenerative arthropathy of both hips. Electronically Signed   By: Ryan Salvage M.D.   On: 04/17/2024 10:41   DG Chest Port 1 View Result Date: 04/17/2024 EXAM: 1 VIEW XRAY OF THE CHEST 04/17/2024 06:22:11 AM COMPARISON: 02/21/2024 CLINICAL HISTORY: SOB (shortness of breath) [id number]. Per chart - Hypoglycemia; Alcohol Intoxication; Reason for exam - SOB FINDINGS: LUNGS AND PLEURA: Persistent pleural thickening over the left upper lobe. Persistent cavitary process within the left upper lobe measuring approximately 6.1 cm on today's study. Loculated air overlying the peripheral left upper lobe may reflect underlying bronchopulmonary fistula. New airspace disease is identified within the left base and right mid lung. No  pleural effusion. HEART AND MEDIASTINUM: No acute abnormality of the cardiac and mediastinal silhouettes. BONES AND SOFT TISSUES: No acute osseous abnormality. Surgical clips at GE junction. IMPRESSION: 1. Persistent cavitary process within the left upper lobe, measuring approximately 6.1 cm, with associated pleural thickening.  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 2. New airspace disease within the left base and right mid lung. 3. No pleural effusion. Electronically signed by: Waddell Calk MD 04/17/2024 06:37 AM EDT RP Workstation: [id number]    Review of Systems  Constitutional:  Positive for activity change, appetite change and fatigue.  Respiratory:  Positive for cough and shortness of breath.   Cardiovascular:  Positive for chest pain.  Gastrointestinal:  Positive for blood in stool and diarrhea.  Genitourinary:  Positive for urgency.  Musculoskeletal:  Positive for arthralgias and myalgias.  Neurological:  Positive for dizziness, weakness and light-headedness.   Blood pressure 115/70, pulse 69, temperature (!) 97.5 F (36.4 C), temperature source Oral, resp. rate 20, height 5' 5 (1.651 m), weight 68 kg, SpO2 100%. Physical Exam Vitals reviewed.  Constitutional:      General: He is not in acute distress. HENT:     Head: Normocephalic and atraumatic.  Eyes:     General: Scleral icterus present.     Extraocular Movements: Extraocular movements intact.     Pupils: Pupils are equal, round, and reactive to light.  Cardiovascular:     Rate and Rhythm: Normal rate and regular rhythm.     Heart sounds: No murmur heard. Pulmonary:     Effort: Pulmonary effort is normal.     Breath sounds: Wheezing present.     Comments: Fair exchange throughout Abdominal:     General: There is no distension.     Tenderness: There is no abdominal tenderness. There is no guarding.  Musculoskeletal:        General: No swelling.      Right lower leg: No edema.     Left lower leg: No edema.  Skin:    General: Skin is warm and dry.     Capillary Refill: Capillary refill takes less than 2 seconds.     Findings: No rash.  Neurological:     Mental Status: He is alert. Mental status is at baseline.     Motor: Weakness present.     Assessment/Plan: 5 68 year old male admitted with hypoglycemia and severe hyponatremia which have improved with medical management. 2 Chronic alcoholism- reports only beer use.  Had potamania at admission.  Findings of cirrhosis on ultrasound of abdomen done in July 2025. 3 Tobacco abuse/COPD/emphysema 4 anemia- normocytic, B complex deficiencies 5 protein calorie malnutrition patient 6  4.1 cm ascending aortic aneurysm 7 history of DVT 8 chronic homelessness-reports he does occasionally stay with his daughter but mostly he sleeps on the streets. 9 prediabetes 10 psoriatic arthritis 11 cervical spinal stenosis  Left upper lobe cavitary lesion/pneumonia.  Continue medical management.  Surgery currently not indicated.  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  History of scant hemoptysis at previous admission, but none currently.  Previously bronched, and evidence of malignancy, TB, or fungus.  No plans for surgery at this point.  Continue medical management.  Cody Glenn

## 2024-04-18 NOTE — Plan of Care (Signed)
 -  RN reported that patient has low-grade fever and 2 episodes of loose stool today.  Checking GI panel.

## 2024-04-18 NOTE — Consult Note (Signed)
 NAME:  Cody Glenn, MRN:  [id number], DOB:  05/13/1956, LOS: 2 ADMISSION DATE:  04/15/2024, CONSULTATION DATE:  04/17/24 REFERRING MD:  Dennise, CHIEF COMPLAINT:  abnormal chest xr   History of Present Illness:  68 yo M PMH etoh abuse, dvt, DM, HTN, cavitary lung lesion who was admitted to [facility] 04/16/24 after presenting to ED for AMS. He was found to be hypoglycemic which was addressed, and have AoC hyponatremia for which nephrology was consulted.  A CXR was obtained 9/10 which reveals LUL cavitary lesion, and air concerning for possible BPF.    PCCM consulted in this setting   He has a known LUL cavitary lesion that was worked up on prior admission, and was dc in July on 3wk augmentin . He reports to me he completed this. No resp complaints. Does endorse frequent coughing when he swallows thin liquids, specifically noting that he coughs a lot when he drinks alcohol.   Pertinent  Medical History  Etoh abuse  Cavitary lung lesion Malnutrition Dvt HTN   Significant Hospital Events: Including procedures, antibiotic start and stop dates in addition to other pertinent events   9/9 admitted 9/10 pulm consult, GI panel positive for EPEC and Shigella  Interim History / Subjective:    He is not feeling well We reviewed his CT Chest scan results and that surgery is not an option at this time. We do not have cultures at this time, he is not bring up much sputum. He is open to having bronchoscopy done to help guide antibiotics if able.   Objective    Blood pressure (!) 98/56, pulse 66, temperature 97.7 F (36.5 C), temperature source Oral, resp. rate 14, height 5' 5 (1.651 m), weight 68 kg, SpO2 99%.        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  Suspected dysphagia  EPEC/Shigella gastroenteritis  P -patient agreeable to bronchoscopy with BAL for updated respiratory cultures - plan for bronchoscopy 9/12, will be NPO after midnight tonight -SLP is going to evaluate him today  -MRSA PCR is negative - he is on azithromycin  for gastroenteritis and Unasyn  for cavitary lung lesion  PCCM will follow   Labs   CBC: Recent Labs  Lab 04/15/24 2045 04/16/24 0743 04/16/24 2328 04/17/24 0807 04/18/24 0253  WBC 15.2* 10.3 10.2 12.6* 15.2*  NEUTROABS 12.7*  --   --  11.0* 12.4*  HGB 12.1* 9.5* 9.5* 10.5* 8.4*  HCT 33.9* 26.0* 25.9* 29.4* 23.2*  MCV 93.4 91.9 92.5 94.2 95.1  PLT 277 261 261 288 202    Basic Metabolic Panel: Recent Labs  Lab 04/16/24 0334 04/16/24 0531 04/16/24 0743 04/16/24 1101 04/16/24 2328 04/17/24 0807 04/17/24 1442 04/17/24 2013 04/18/24 0253  NA 114*   < > 115*   < > 121* 124* 121* 126* 130*  K  --   --  3.7   < > 3.5 3.7 3.1* 3.5 3.3*  CL  --   --  82*   < > 91* 90* 94* 96* 100  CO2  --   --  20*   < > 21* 23 18* 19* 22  GLUCOSE  --   --  88   < > 104* 86 216* 97 101*  BUN  --   --  7*   < > 8 8 7* 7* 5*  CREATININE  --   --  0.44*   < > 0.59* 0.59* 0.75 0.70 0.56*  CALCIUM   --   --  8.0*   < > 7.8* 8.2* 7.4* 7.4* 7.7*  MG 1.5*  --  3.2*  --  1.5*  --  2.0  --  1.8  PHOS  --   --  2.5  --   --  2.1*  --   --  2.9   < > = values in this interval not displayed.   GFR: Estimated Creatinine Clearance: 77.9 mL/min (A) (by C-G formula based on SCr of 0.56 mg/dL (L)). Recent Labs  Lab 04/16/24 0743 04/16/24 2328 04/17/24 0807 04/18/24 0253  PROCALCITON  --   --   --  6.72  WBC 10.3 10.2 12.6* 15.2*    Liver  Function Tests: Recent Labs  Lab 04/16/24 0334 04/16/24 0743 04/16/24 2328 04/17/24 0807 04/18/24 0253  AST 44* 35 35 32 22  ALT 18 16 15 17 13   ALKPHOS 73 66 67 71 58  BILITOT 1.5* 1.0 0.9 1.2 0.8  PROT 6.1* 5.4* 4.6* 5.9* 4.8*  ALBUMIN 2.2* 2.0* 1.8* 2.2* 1.7*   No results for input(s): LIPASE, AMYLASE in the last 168 hours. Recent Labs  Lab 04/17/24 0807 04/18/24 0253  AMMONIA <13 27    ABG    Component Value Date/Time   TCO2 20 (L) 04/15/2024 2043     Coagulation Profile: Recent Labs  Lab 04/17/24 0807  INR 1.0    Cardiac Enzymes: No results for input(s): CKTOTAL, CKMB, CKMBINDEX, TROPONINI in the last 168 hours.  HbA1C: Hgb A1c MFr Bld  Date/Time Value Ref Range Status  04/16/2024 11:01 AM 4.1 (L) 4.8 - 5.6 % Final    Comment:    (NOTE) Diagnosis of Diabetes The following HbA1c ranges recommended by the American Diabetes Association (ADA) may be used as an aid in the diagnosis of diabetes mellitus.  Hemoglobin             Suggested A1C NGSP%              Diagnosis  <5.7                   Non Diabetic  5.7-6.4                Pre-Diabetic  >6.4                   Diabetic  <7.0                   Glycemic control for                       adults with diabetes.    10/13/2020 02:08 PM 5.5 4.8 - 5.6 % Final    Comment:             Prediabetes: 5.7 - 6.4          Diabetes: >6.4          Glycemic control for adults with diabetes: <7.0  CBG: Recent Labs  Lab 04/17/24 0914 04/17/24 1204 04/17/24 1745 04/17/24 2313 04/18/24 0700  GLUCAP 85 140* 109* 120* 182*       Critical care time: na    Dorn Chill, MD [facility] Office: [phone]   See Amion for personal pager PCCM on call pager [phone] until 7pm. Please call Elink 7p-7a. [phone]

## 2024-04-18 NOTE — Evaluation (Signed)
 Occupational Therapy Evaluation Patient Details Name: Cody Glenn MRN: [id number] DOB: 12-Jul-1956 Today's Date: 04/18/2024   History of Present Illness   Pt is 68 year old presented to [facility] on  04/15/24 for acute on chronic hyponatremia due to alcohol use. PMH - etoh abuse, DVT, htn, cervical stenosis, partial gastrectomy     Clinical Impressions At baseline, pt is Independent with ADLs and Mod I with functional mobility with a Rollator. Pt now presents with decreased activity tolerance, generalized B UE weakness, decreased AROM on Left shoulder and L elbow, decreased balance, impaired cognition (uncertain of baseline), and decreased safety and independence with functional tasks. Pt currently demonstrates ability to largely complete ADLs with Set up to Total assist, bed mobility with Min assist, and functional transfers with a Rollator with Mod to Max assist. Pt participated well in session. Pt's VSS on RA throughout session. Pt will benefit from acute skilled OT to address deficits outlined below and increase safety and independence with functional tasks. Post acute discharge, pt will benefit from intensive inpatient skilled rehab services < 3 hours per day to maximize rehab potential.      If plan is discharge home, recommend the following:   A lot of help with walking and/or transfers;A lot of help with bathing/dressing/bathroom;Assistance with cooking/housework;Direct supervision/assist for medications management;Direct supervision/assist for financial management;Assist for transportation;Help with stairs or ramp for entrance     Functional Status Assessment   Patient has had a recent decline in their functional status and demonstrates the ability to make significant improvements in function in a reasonable and predictable amount of time.     Equipment Recommendations    (defer to next level of care)     Recommendations for Other Services          Precautions/Restrictions   Precautions Precautions: Fall Restrictions Weight Bearing Restrictions Per Provider Order: No     Mobility Bed Mobility Overal bed mobility: Needs Assistance Bed Mobility: Supine to Sit, Sit to Supine     Supine to sit: Min assist Sit to supine: Min assist   General bed mobility comments: Assist to elevate trunk and assist to bring feet back up into bed.    Transfers Overall transfer level: Needs assistance Equipment used: Rollator (4 wheels) Transfers: Sit to/from Stand, Bed to chair/wheelchair/BSC Sit to Stand: Mod assist     Step pivot transfers: Max assist     General transfer comment: Mod assist to power up. Heavy Max assist in stepping      Balance Overall balance assessment: Needs assistance Sitting-balance support: No upper extremity supported, Feet supported Sitting balance-Leahy Scale: Fair Sitting balance - Comments: Able to sit EOB with Supervision   Standing balance support: Bilateral upper extremity supported, During functional activity, Reliant on assistive device for balance Standing balance-Leahy Scale: Poor Standing balance comment: Pt dependent on B UE support in standing                           ADL either performed or assessed with clinical judgement   ADL Overall ADL's : Needs assistance/impaired Eating/Feeding: Set up;Sitting   Grooming: Set up;Sitting   Upper Body Bathing: Minimal assistance;Moderate assistance;Sitting   Lower Body Bathing: Maximal assistance;Sit to/from stand;+2 for safety/equipment;Cueing for compensatory techniques   Upper Body Dressing : Minimal assistance;Sitting   Lower Body Dressing: Maximal assistance;Total assistance;Sit to/from stand;Cueing for compensatory techniques   Toilet Transfer: Maximal assistance;BSC/3in1;Rollator (4 wheels);Cueing for safety (step-pivot; cues for hand placement/technique)  Toilet Transfer Details (indicate cue type and reason): simulated  at EOB; heavy Max assist Toileting- Clothing Manipulation and Hygiene: Total assistance;Sit to/from stand       Functional mobility during ADLs:  (deferred this session for safety due to pt fatigue) General ADL Comments: Pt with decreased activity tolerance; fatiguing quickly during tasks.     Vision Patient Visual Report: No change from baseline Additional Comments: Vision [facility] for tasks assessed, not formally screened or evaluated     Perception         Praxis         Pertinent Vitals/Pain Pain Assessment Pain Assessment: Faces Faces Pain Scale: Hurts little more Pain Location: L UE with movement Pain Descriptors / Indicators: Discomfort, Grimacing, Guarding Pain Intervention(s): Limited activity within patient's tolerance, Monitored during session, Repositioned     Extremity/Trunk Assessment Upper Extremity Assessment Upper Extremity Assessment: Right hand dominant;RUE deficits/detail;LUE deficits/detail;Generalized weakness RUE Deficits / Details: generalized weakness RUE Coordination:  (WFL) LUE Deficits / Details: generalized weakness; decreased AROM in shoulder with pt unable to tolerate AAROM/PROM this session due to pain with pt reporting this is due to a hx or psoriatic arthritis; pt with decreased AROM elbow extension and self-ROM extension WFL but with pt largely holding elbow in flexion throughout session unless holding onto Rollator and initially requiring cues to hold Rollaotr with L hand; wrist and hand AROM largely WFL; able to use L UE functionally LUE Coordination: decreased gross motor   Lower Extremity Assessment Lower Extremity Assessment: Defer to PT evaluation       Communication Communication Communication: No apparent difficulties   Cognition Arousal: Alert Behavior During Therapy: WFL for tasks assessed/performed Cognition: Cognition impaired, No family/caregiver present to determine baseline     Awareness: Intellectual awareness intact,  Online awareness impaired Memory impairment (select all impairments): Short-term memory, Working memory Attention impairment (select first level of impairment): Selective attention (easily internally distracted) Executive functioning impairment (select all impairments): Organization, Reasoning, Problem solving OT - Cognition Comments: AAOx4 and pleasant throughout session with deficts noted above.                 Following commands: Impaired Following commands impaired: Follows one step commands with increased time, Only follows one step commands consistently     Cueing  General Comments   Cueing Techniques: Tactile cues;Verbal cues  VSS on RA throughout session   Exercises     Shoulder Instructions      Home Living Family/patient expects to be discharged to:: Shelter/Homeless Living Arrangements: Alone                               Additional Comments: Pt states he is uncertain. Pt mentioned he may be able to stay with his sister, but also reports he has not spoken to her.      Prior Functioning/Environment Prior Level of Function : Independent/Modified Independent             Mobility Comments: Ambulates with rollator. Pt reports several falls in the past, but is unable to provide further details. ADLs Comments: Ind with ADLs    OT Problem List: Decreased strength;Decreased range of motion;Decreased activity tolerance;Impaired balance (sitting and/or standing);Decreased cognition;Decreased safety awareness   OT Treatment/Interventions: Self-care/ADL training;Therapeutic exercise;DME and/or AE instruction;Energy conservation;Therapeutic activities;Cognitive remediation/compensation;Patient/family education;Balance training      OT Goals(Current goals can be found in the care plan section)   Acute Rehab OT Goals Patient Stated  Goal: to feel better OT Goal Formulation: With patient Time For Goal Achievement: 05/02/24 Potential to Achieve Goals:  Good ADL Goals Pt Will Perform Grooming: with supervision;standing Pt Will Perform Lower Body Bathing: with min assist;sit to/from stand (with adaptive equipment as needed) Pt Will Perform Lower Body Dressing: with min assist;sit to/from stand (with adaptive equipment as needed) Pt Will Transfer to Toilet: with contact guard assist;ambulating;regular height toilet (with least restrictive AD) Pt Will Perform Toileting - Clothing Manipulation and hygiene: with min assist;sit to/from stand Pt/caregiver will Perform Home Exercise Program: Increased ROM;Increased strength;Left upper extremity;With Supervision;With written HEP provided (self-AAROM/AAROM progressing to AROM)   OT Frequency:  Min 2X/week    Co-evaluation              AM-PAC OT 6 Clicks Daily Activity     Outcome Measure Help from another person eating meals?: A Little Help from another person taking care of personal grooming?: A Little Help from another person toileting, which includes using toliet, bedpan, or urinal?: Total Help from another person bathing (including washing, rinsing, drying)?: A Lot Help from another person to put on and taking off regular upper body clothing?: A Little Help from another person to put on and taking off regular lower body clothing?: A Lot 6 Click Score: 14   End of Session Equipment Utilized During Treatment: Rollator (4 wheels);Gait belt Nurse Communication: Mobility status;Other (comment) (Pt diet upgraded earlier this day by SLP; however, pt reports lunch he ordered is too hard to chew - SLP also notified.)  Activity Tolerance: Patient tolerated treatment well;Patient limited by fatigue Patient left: in bed;with call bell/phone within reach;with bed alarm set  OT Visit Diagnosis: Unsteadiness on feet (R26.81);Other abnormalities of gait and mobility (R26.89);History of falling (Z91.81);Other symptoms and signs involving cognitive function;Muscle weakness (generalized) (M62.81)                 Time: 8650-8575 OT Time Calculation (min): 35 min Charges:  OT General Charges $OT Visit: 1 Visit OT Evaluation $OT Eval Moderate Complexity: 1 Mod OT Treatments $Self Care/Home Management : 8-22 mins  Margarie Rockey HERO., OTR/L, MA Acute Rehab [phone]   Margarie FORBES Horns 04/18/2024, 3:10 PM

## 2024-04-18 NOTE — Progress Notes (Signed)
 [facility] NEPHROLOGY PROGRESS NOTE  Assessment/ Plan: Pt is a 68 y.o. yo male  with past medical history significant for alcohol abuse, PVD, DVT, hypertension, homelessness, chronic hyponatremia presented with alcohol intoxication, seen as a consultation for the evaluation of hyponatremia.   # Acute on chronic hyponatremia due to beer potomania/reduced solute intake/dehydration.  Baseline sodium level seems to be around anywhere between 125-130 however presented with lowest sodium level of 112.  TSH level acceptable. Treated with normal saline and salt tablet with gradual improvement of serum sodium level to 130. NS was discontinued however noted the primary team started LR possibly because of poor oral intake.  Can DC salt tablet in next 1 to 2 days as long as sodium remains stable.  Encourage oral intake and high-protein diet.   # Alcohol intoxication, watch for alcohol withdrawal: Currently on thiamine , multivitamin and hydration per primary team.   # Protein calorie malnutrition: Needs high-protein diet.    # Acute febrile illness/left lung cavitary lesion/sepsis.  Seen by pulmonary team, on Unasyn .  Sodium level has improved.  Sign off, please call us  back with question.  Discussed the primary team.  Subjective: Seen and examined at bedside.  No new event overnight.  No nausea, vomiting, chest pain or shortness of breath.  Objective Vital signs in last 24 hours: Vitals:   04/17/24 2041 04/18/24 0000 04/18/24 0400 04/18/24 0827  BP: 112/76 133/81 (!) 98/56 115/70  Pulse: 84 87 66 69  Resp: 20 18 14 20   Temp: 98.8 F (37.1 C) (!) 102.9 F (39.4 C) 97.7 F (36.5 C) (!) 97.5 F (36.4 C)  TempSrc: Oral Oral Oral Oral  SpO2: 100% 97% 99% 100%  Weight:      Height:       Weight change:   Intake/Output Summary (Last 24 hours) at 04/18/2024 1108 Last data filed at 04/18/2024 0624 Gross per 24 hour  Intake 609.71 ml  Output 1400 ml  Net -790.29 ml        Labs: RENAL PANEL Recent Labs  Lab 04/16/24 0334 04/16/24 0531 04/16/24 0743 04/16/24 1101 04/16/24 2328 04/17/24 0807 04/17/24 1442 04/17/24 2013 04/18/24 0253  NA 114*   < > 115*   < > 121* 124* 121* 126* 130*  K  --   --  3.7   < > 3.5 3.7 3.1* 3.5 3.3*  CL  --   --  82*   < > 91* 90* 94* 96* 100  CO2  --   --  20*   < > 21* 23 18* 19* 22  GLUCOSE  --   --  88   < > 104* 86 216* 97 101*  BUN  --   --  7*   < > 8 8 7* 7* 5*  CREATININE  --   --  0.44*   < > 0.59* 0.59* 0.75 0.70 0.56*  CALCIUM   --   --  8.0*   < > 7.8* 8.2* 7.4* 7.4* 7.7*  MG 1.5*  --  3.2*  --  1.5*  --  2.0  --  1.8  PHOS  --   --  2.5  --   --  2.1*  --   --  2.9  ALBUMIN 2.2*  --  2.0*  --  1.8* 2.2*  --   --  1.7*   < > = values in this interval not displayed.    Liver Function Tests: Recent Labs  Lab 04/16/24 2328 04/17/24 9192  04/18/24 0253  AST 35 32 22  ALT 15 17 13   ALKPHOS 67 71 58  BILITOT 0.9 1.2 0.8  PROT 4.6* 5.9* 4.8*  ALBUMIN 1.8* 2.2* 1.7*   No results for input(s): LIPASE, AMYLASE in the last 168 hours. Recent Labs  Lab 04/17/24 0807 04/18/24 0253  AMMONIA <13 27   CBC: Recent Labs    02/20/24 1653 02/21/24 0139 04/15/24 2045 04/16/24 0743 04/16/24 2328 04/17/24 0807 04/18/24 0253  HGB  --    < > 12.1* 9.5* 9.5* 10.5* 8.4*  MCV  --    < > 93.4 91.9 92.5 94.2 95.1  VITAMINB12 414  --   --   --   --   --   --   FOLATE 9.3  --   --   --   --   --   --    < > = values in this interval not displayed.    Cardiac Enzymes: No results for input(s): CKTOTAL, CKMB, CKMBINDEX, TROPONINI in the last 168 hours. CBG: Recent Labs  Lab 04/17/24 0914 04/17/24 1204 04/17/24 1745 04/17/24 2313 04/18/24 0700  GLUCAP 85 140* 109* 120* 182*    Iron Studies: No results for input(s): IRON, TIBC, TRANSFERRIN, FERRITIN in the last 72 hours. Studies/Results: CT CHEST W CONTRAST Result Date: 04/17/2024 CLINICAL DATA:  Pneumonia, complication  suspected, xray done, short of breath EXAM: CT CHEST WITH CONTRAST TECHNIQUE: Multidetector CT imaging of the chest was performed during intravenous contrast administration. RADIATION DOSE REDUCTION: This exam was performed according to the departmental dose-optimization program which includes automated exposure control, adjustment of the mA and/or kV according to patient size and/or use of iterative reconstruction technique. CONTRAST:  50mL OMNIPAQUE  IOHEXOL  350 MG/ML SOLN COMPARISON:  04/17/2024, 02/26/2024 FINDINGS: Cardiovascular: The heart is unremarkable without pericardial effusion. 4.1 cm ascending thoracic aortic aneurysm. No evidence of dissection. Atherosclerosis of the aorta and coronary vasculature again noted. Mediastinum/Nodes: Stable borderline enlarged mediastinal and left hilar lymph nodes, largest measuring up to 9 mm in short axis. Thyroid , trachea, and esophagus are unremarkable. Lungs/Pleura: Trace bilateral pleural effusions, left greater than right. There is a large thick walled cavitary lesion within the left upper lobe, increased in size since prior study, measuring 11.7 x 6.6 x 4.9 cm. The mural thickening is greatest along the inferior aspect of this lesion, with a small gas fluid level identified. There is direct communication between a left lower lobe subsegmental bronchus and this thick walled cavitary lesion, reference image 57 and 58 of series 4. Upper lobe predominant emphysema again noted. There are bibasilar areas of subpleural scarring and fibrosis unchanged. Progressive ground-glass airspace disease within the left lower lobe and lingula since prior exam, compatible with worsening inflammation or infection no evidence of pneumothorax. Upper Abdomen: No acute abnormality. Musculoskeletal: No acute or destructive bony abnormalities. Reconstructed images demonstrate no additional findings. IMPRESSION: 1. Increasing size of the thick walled cavitary lesion within the left upper  lobe, consistent with progressive cavitary pneumonia. While the soft tissue nodule within the inferior aspect of the lesion is no longer visualized, there has been marked progression in the mural thickening seen along the inferior extent, consistent with progressive infection. There is communication between this cavitary lesion and a subsegmental left upper lobe bronchus as described above. 2. Worsening ground-glass airspace disease within the lingula and left lower lobe, compatible with worsening infection. 3. Trace bilateral pleural effusions, left greater than right. 4. Aortic Atherosclerosis (ICD10-I70.0) and Emphysema (ICD10-J43.9). Electronically Signed  By: Ozell Daring M.D.   On: 04/17/2024 15:39   DG Abd Portable 1V Result Date: 04/17/2024 CLINICAL DATA:  Nausea, hypoglycemia EXAM: PORTABLE ABDOMEN - 1 VIEW COMPARISON:  Overlapping portions CT chest 02/26/2024 FINDINGS: Notable lower thoracic and lumbar spondylosis. Atherosclerosis is present, including aortoiliac atherosclerotic disease. Moderate degenerative arthropathy of both hips. Scattered gas in small and large bowel without overtly dilated bowel identified. IMPRESSION: 1. Scattered gas in small and large bowel without overtly dilated bowel identified. 2. Lower thoracic and lumbar spondylosis. 3. Moderate degenerative arthropathy of both hips. Electronically Signed   By: Ryan Salvage M.D.   On: 04/17/2024 10:41   DG Chest Port 1 View Result Date: 04/17/2024 EXAM: 1 VIEW XRAY OF THE CHEST 04/17/2024 06:22:11 AM COMPARISON: 02/21/2024 CLINICAL HISTORY: SOB (shortness of breath) [id number]. Per chart - Hypoglycemia; Alcohol Intoxication; Reason for exam - SOB FINDINGS: LUNGS AND PLEURA: Persistent pleural thickening over the left upper lobe. Persistent cavitary process within the left upper lobe measuring approximately 6.1 cm on today's study. Loculated air overlying the peripheral left upper lobe may reflect underlying bronchopulmonary  fistula. New airspace disease is identified within the left base and right mid lung. No pleural effusion. HEART AND MEDIASTINUM: No acute abnormality of the cardiac and mediastinal silhouettes. BONES AND SOFT TISSUES: No acute osseous abnormality. Surgical clips at GE junction. IMPRESSION: 1. Persistent cavitary process within the left upper lobe, measuring approximately 6.1 cm, with associated pleural thickening. 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 2. New airspace disease within the left base and right mid lung. 3. No pleural effusion. Electronically signed by: Waddell Calk MD 04/17/2024 06:37 AM EDT RP Workstation: [id number]    Medications: Infusions:  ampicillin -sulbactam (UNASYN ) IV 200 mL/hr at 04/18/24 9375   lactated ringers  Stopped (04/18/24 0557)    Scheduled Medications:  azithromycin   500 mg Oral Daily   feeding supplement  1 Container Oral TID BM   folic acid   1 mg Oral Daily   heparin  injection (subcutaneous)  5,000 Units Subcutaneous Q8H   lidocaine   1 patch Transdermal Q24H   multivitamin with minerals  1 tablet Oral Daily   nicotine   14 mg Transdermal Daily   pantoprazole   40 mg Oral Daily   potassium chloride   40 mEq Oral BID   sodium chloride   1 g Oral TID WC   thiamine   100 mg Oral Daily   Or   thiamine   100 mg Intravenous Daily    have reviewed scheduled and prn medications.  Physical Exam: General:NAD, comfortable Heart:RRR, s1s2 nl Lungs:clear b/l, no crackle Abdomen:soft, Non-tender, non-distended Extremities:No edema Neurology: Alert, awake and following commands  Manasvini Whatley Amelie Romney 04/18/2024,11:08 AM  LOS: 2 days

## 2024-04-18 NOTE — Progress Notes (Signed)
 PROGRESS NOTE                                                                                                                                                                                                             Patient Demographics:    Cody Glenn, is a 68 y.o. male, DOB - 11/26/1955, FMW:[id number]  Outpatient Primary MD for the patient is [name], MD    LOS - 2  Admit date - 04/15/2024    Chief Complaint  Patient presents with   Hypoglycemia   Alcohol Intoxication       Brief Narrative (HPI from H&P)    68 y.o. year old male with past medical history of heavy alcohol use, DVT, PUD, history of partial gastrectomy, active smoking, prediabetes, primary hypertension, cervical spinal stenosis, and homelessness.  He presents to [name], ED after being found by a bystander in a parking lot.  On EMS arrival patient was hypoglycemic and was given an amp of D50.  In the ER workup consistent with hyponatremia, dehydration with multiple electrolyte abnormalities.  Chest x-ray also showed left upper lobe cavitary lesion.   Subjective:   Patient in bed, appears comfortable, denies any headache, no fever, no chest pain or pressure, no shortness of breath , no abdominal pain. No focal weakness.   Assessment  & Plan :    Severe dehydration, hyponatremia, hypomagnesemia, hypocalcemia, hypophosphatemia.  In a patient with ongoing alcohol abuse, homeless. Nephrology on board being gently hydrated, sodium gradually improving, electrolytes have been replaced and will be monitored.   Left lung cavitary lesion with high fever.  Septic.  Homeless, alcoholic, high risk for chronic aspiration, sepsis workup initiated, IV fluids, pancultured, CT chest confirms left-sided cavitary lesion questionable abscess, he has had pulmonary evaluation for the same few weeks ago and had received antibiotics at that time, currently on  Unasyn ,.  Speech and pulmonary consulted.  Continue to monitor.  # Shigella gastroenteritis with diarrhea.  5 days of azithromycin .  ##Protein Calorie Malnutrition  Dietitian consulted.   #Alcohol use disorder Patient reports drinking 6-8 beers daily, reports last drink was yesterday - Patient counseled on importance of cessation - CIWA protocol with PRN symptom triggered Ativan    #Tobacco Use Disorder - Patient counseled on importance of cessation - Nicotine  replacement therapy with nicotine  patch and gum while  inpatient  #Normocytic Anemia Stable   #COPD Not on any outpatient maintenance inhalers -PRN Albuterol    #Hypertension - Patient reports he is not taking Diovan  - BP normotensive, monitor   #Peptic Ulcer Disease - Protonix    #Chronic Neck Pain - Lidocaine  patch - PRN toradol   #Normocytic Anemia Stable   #COPD Not on any outpatient maintenance inhalers -PRN Albuterol    #Hypertension - Patient reports he is not taking Diovan  - BP normotensive, monitor   #Peptic Ulcer Disease - Protonix    #Chronic Neck Pain - Lidocaine  patch - PRN toradol   ##Hypoglycemia - Likely due to poor oral intake, A1c was low, monitor on oral diet, check CBGs.  Check TSH, free T4 and cortisol levels as well.    Lab Results  Component Value Date   HGBA1C 4.1 (L) 04/16/2024   CBG (last 3)  Recent Labs    04/17/24 1745 04/17/24 2313 04/18/24 0700  GLUCAP 109* 120* 182*        Condition - Extremely Guarded  Family Communication  : None present  Code Status : Full code  Consults  : Pulmonary, nephrology  PUD Prophylaxis :  PPI   Procedures  :     CT chest with IV contrast ordered 04/17/2024 - 1. Increasing size of the thick walled cavitary lesion within the left upper lobe, consistent with progressive cavitary pneumonia. While the soft tissue nodule within the inferior aspect of the lesion is no longer visualized, there has been marked progression in the mural  thickening seen along the inferior extent, consistent with progressive infection. There is communication between this cavitary lesion and a subsegmental left upper lobe bronchus as described above. 2. Worsening ground-glass airspace disease within the lingula and left lower lobe, compatible with worsening infection. 3. Trace bilateral pleural effusions, left greater than right. 4. Aortic Atherosclerosis (ICD10-I70.0) and Emphysema (ICD10-J43.9).       Disposition Plan  :    Status is: Inpatient  DVT Prophylaxis  :    heparin  injection 5,000 Units Start: 04/17/24 1400 Place and maintain sequential compression device Start: 04/16/24 1157    Lab Results  Component Value Date   PLT 202 04/18/2024    Diet :  Diet Order             DIET DYS 2 Room service appropriate? Yes with Assist; Fluid consistency: Thin; Fluid restriction: 1200 mL Fluid  Diet effective now                    Inpatient Medications  Scheduled Meds:  azithromycin   500 mg Oral Daily   feeding supplement  1 Container Oral TID BM   folic acid   1 mg Oral Daily   heparin  injection (subcutaneous)  5,000 Units Subcutaneous Q8H   lidocaine   1 patch Transdermal Q24H   multivitamin with minerals  1 tablet Oral Daily   nicotine   14 mg Transdermal Daily   pantoprazole   40 mg Oral Daily   potassium chloride   40 mEq Oral BID   sodium chloride   1 g Oral TID WC   thiamine   100 mg Oral Daily   Or   thiamine   100 mg Intravenous Daily   Continuous Infusions:  ampicillin -sulbactam (UNASYN ) IV 200 mL/hr at 04/18/24 0624   lactated ringers  Stopped (04/18/24 0557)   PRN Meds:.acetaminophen  **OR** acetaminophen , albuterol , dextrose , ketorolac , LORazepam  **OR** LORazepam , melatonin, nicotine  polacrilex, ondansetron  **OR** ondansetron  (ZOFRAN ) IV, polyethylene glycol  Antibiotics  :    Anti-infectives (From admission, onward)  Start     Dose/Rate Route Frequency Ordered Stop   04/18/24 1100  azithromycin  (ZITHROMAX )  tablet 500 mg        500 mg Oral Daily 04/18/24 1002 04/23/24 0959   04/17/24 1200  Ampicillin -Sulbactam (UNASYN ) 3 g in sodium chloride  0.9 % 100 mL IVPB        3 g 200 mL/hr over 30 Minutes Intravenous Every 6 hours 04/17/24 1025           Objective:   Vitals:   04/17/24 2041 04/18/24 0000 04/18/24 0400 04/18/24 0827  BP: 112/76 133/81 (!) 98/56 115/70  Pulse: 84 87 66 69  Resp: 20 18 14 20   Temp: 98.8 F (37.1 C) (!) 102.9 F (39.4 C) 97.7 F (36.5 C) (!) 97.5 F (36.4 C)  TempSrc: Oral Oral Oral Oral  SpO2: 100% 97% 99% 100%  Weight:      Height:        Wt Readings from Last 3 Encounters:  04/15/24 68 kg  03/18/24 69 kg  02/20/24 69 kg     Intake/Output Summary (Last 24 hours) at 04/18/2024 1005 Last data filed at 04/18/2024 9375 Gross per 24 hour  Intake 609.71 ml  Output 1400 ml  Net -790.29 ml     Physical Exam  Awake Alert, No new F.N deficits, Normal affect .AT,PERRAL Supple Neck, No JVD,   Symmetrical Chest wall movement, coarse bilateral breath sounds RRR,No Gallops,Rubs or new Murmurs,  +ve B.Sounds, Abd Soft, No tenderness,   No Cyanosis, Clubbing or edema        Data Review:    Recent Labs  Lab 04/15/24 2045 04/16/24 0743 04/16/24 2328 04/17/24 0807 04/18/24 0253  WBC 15.2* 10.3 10.2 12.6* 15.2*  HGB 12.1* 9.5* 9.5* 10.5* 8.4*  HCT 33.9* 26.0* 25.9* 29.4* 23.2*  PLT 277 261 261 288 202  MCV 93.4 91.9 92.5 94.2 95.1  MCH 33.3 33.6 33.9 33.7 34.4*  MCHC 35.7 36.5* 36.7* 35.7 36.2*  RDW 12.6 12.7 12.7 12.9 13.2  LYMPHSABS 1.4  --   --  0.8 1.3  MONOABS 0.9  --   --  0.6 1.2*  EOSABS 0.0  --   --  0.0 0.0  BASOSABS 0.0  --   --  0.0 0.1    Recent Labs  Lab 04/16/24 0334 04/16/24 0531 04/16/24 0743 04/16/24 1101 04/16/24 1610 04/16/24 2328 04/17/24 0807 04/17/24 1059 04/17/24 1442 04/17/24 2013 04/18/24 0253  NA 114*   < > 115* 118*   < > 121* 124*  --  121* 126* 130*  K  --   --  3.7  --    < > 3.5 3.7  --  3.1*  3.5 3.3*  CL  --   --  82*  --    < > 91* 90*  --  94* 96* 100  CO2  --   --  20*  --    < > 21* 23  --  18* 19* 22  ANIONGAP  --   --  13  --    < > 9 11  --  9 11 8   GLUCOSE  --   --  88  --    < > 104* 86  --  216* 97 101*  BUN  --   --  7*  --    < > 8 8  --  7* 7* 5*  CREATININE  --   --  0.44*  --    < >  0.59* 0.59*  --  0.75 0.70 0.56*  AST 44*  --  35  --   --  35 32  --   --   --  22  ALT 18  --  16  --   --  15 17  --   --   --  13  ALKPHOS 73  --  66  --   --  67 71  --   --   --  58  BILITOT 1.5*  --  1.0  --   --  0.9 1.2  --   --   --  0.8  ALBUMIN 2.2*  --  2.0*  --   --  1.8* 2.2*  --   --   --  1.7*  CRP  --   --   --   --   --   --   --   --   --   --  12.6*  PROCALCITON  --   --   --   --   --   --   --   --   --   --  6.72  INR  --   --   --   --   --   --  1.0  --   --   --   --   TSH  --   --  0.880  --   --   --   --  0.639  --   --   --   HGBA1C  --   --   --  4.1*  --   --   --   --   --   --   --   AMMONIA  --   --   --   --   --   --  <13  --   --   --  27  MG 1.5*  --  3.2*  --   --  1.5*  --   --  2.0  --  1.8  PHOS  --   --  2.5  --   --   --  2.1*  --   --   --  2.9  CALCIUM   --   --  8.0*  --    < > 7.8* 8.2*  --  7.4* 7.4* 7.7*   < > = values in this interval not displayed.      Recent Labs  Lab 04/16/24 0334 04/16/24 0743 04/16/24 1101 04/16/24 1610 04/16/24 2328 04/17/24 0807 04/17/24 1059 04/17/24 1442 04/17/24 2013 04/18/24 0253  CRP  --   --   --   --   --   --   --   --   --  12.6*  PROCALCITON  --   --   --   --   --   --   --   --   --  6.72  INR  --   --   --   --   --  1.0  --   --   --   --   TSH  --  0.880  --   --   --   --  0.639  --   --   --   HGBA1C  --   --  4.1*  --   --   --   --   --   --   --   AMMONIA  --   --   --   --   --  <  13  --   --   --  27  MG 1.5* 3.2*  --   --  1.5*  --   --  2.0  --  1.8  CALCIUM   --  8.0*  --    < > 7.8* 8.2*  --  7.4* 7.4* 7.7*   < > = values in this interval not displayed.     --------------------------------------------------------------------------------------------------------------- Lab Results  Component Value Date   CHOL 213 (H) 10/13/2020   HDL 106 10/13/2020   LDLCALC 93 10/13/2020   TRIG 84 10/13/2020   CHOLHDL 2.0 10/13/2020    Lab Results  Component Value Date   HGBA1C 4.1 (L) 04/16/2024   Recent Labs    04/17/24 1059 04/17/24 1442  TSH 0.639  --   FREET4  --  1.20*   No results for input(s): VITAMINB12, FOLATE, FERRITIN, TIBC, IRON, RETICCTPCT in the last 72 hours. ------------------------------------------------------------------------------------------------------------------ Cardiac Enzymes No results for input(s): CKMB, TROPONINI, MYOGLOBIN in the last 168 hours.  Invalid input(s): CK  Micro Results Recent Results (from the past 240 hours)  Culture, blood (Routine X 2) w Reflex to ID Panel     Status: None (Preliminary result)   Collection Time: 04/17/24 10:55 AM   Specimen: BLOOD LEFT ARM  Result Value Ref Range Status   Specimen Description BLOOD LEFT ARM  Final   Special Requests   Final    BOTTLES DRAWN AEROBIC AND ANAEROBIC Blood Culture results may not be optimal due to an inadequate volume of blood received in culture bottles   Culture   Final    NO GROWTH < 12 HOURS Performed at [facility], [address] [place], [place] [address]    Report Status PENDING  Incomplete  Culture, blood (Routine X 2) w Reflex to ID Panel     Status: None (Preliminary result)   Collection Time: 04/17/24 10:59 AM   Specimen: BLOOD  Result Value Ref Range Status   Specimen Description BLOOD SITE NOT SPECIFIED  Final   Special Requests   Final    BOTTLES DRAWN AEROBIC AND ANAEROBIC Blood Culture results may not be optimal due to an inadequate volume of blood received in culture bottles   Culture   Final    NO GROWTH < 24 HOURS Performed at [facility], [address] [place], [place] [address]     Report Status PENDING  Incomplete  Gastrointestinal Panel by PCR , Stool     Status: Abnormal   Collection Time: 04/18/24 12:38 AM   Specimen: Stool  Result Value Ref Range Status   Campylobacter species NOT DETECTED NOT DETECTED Final   Plesimonas shigelloides NOT DETECTED NOT DETECTED Final   Salmonella species NOT DETECTED NOT DETECTED Final   Yersinia enterocolitica NOT DETECTED NOT DETECTED Final   Vibrio species NOT DETECTED NOT DETECTED Final   Vibrio cholerae NOT DETECTED NOT DETECTED Final   Enteroaggregative E coli (EAEC) NOT DETECTED NOT DETECTED Final   Enteropathogenic E coli (EPEC) DETECTED (A) NOT DETECTED Final    Comment: RESULT CALLED TO, READ BACK BY AND VERIFIED WITH: KEDRIC DARING RN [phone] 04/18/24 HNM    Enterotoxigenic E coli (ETEC) NOT DETECTED NOT DETECTED Final   Shiga like toxin producing E coli (STEC) NOT DETECTED NOT DETECTED Final   Shigella/Enteroinvasive E coli (EIEC) DETECTED (A) NOT DETECTED Final    Comment: RESULT CALLED TO, READ BACK BY AND VERIFIED WITHBETHA KEDRIC DARING RN [phone] 04/18/24 HNM  Cryptosporidium NOT DETECTED NOT DETECTED Final   Cyclospora cayetanensis NOT DETECTED NOT DETECTED Final   Entamoeba histolytica NOT DETECTED NOT DETECTED Final   Giardia lamblia NOT DETECTED NOT DETECTED Final   Adenovirus F40/41 NOT DETECTED NOT DETECTED Final   Astrovirus NOT DETECTED NOT DETECTED Final   Norovirus GI/GII NOT DETECTED NOT DETECTED Final   Rotavirus A NOT DETECTED NOT DETECTED Final   Sapovirus (I, II, IV, and V) NOT DETECTED NOT DETECTED Final    Comment: Performed at [facility], [address] [place], [place] [address]  MRSA Next Gen by PCR, Nasal     Status: None   Collection Time: 04/18/24  5:45 AM   Specimen: Nasal Mucosa; Nasal Swab  Result Value Ref Range Status   MRSA by PCR Next Gen NOT DETECTED NOT DETECTED Final    Comment: (NOTE) The GeneXpert MRSA Assay (FDA approved for NASAL specimens only), is one  component of a comprehensive MRSA colonization surveillance program. It is not intended to diagnose MRSA infection nor to guide or monitor treatment for MRSA infections. Test performance is not FDA approved in patients less than 60 years old. Performed at [facility], [address] [place], [place] [address]     Radiology Report CT CHEST W CONTRAST Result Date: 04/17/2024 CLINICAL DATA:  Pneumonia, complication suspected, xray done, short of breath EXAM: CT CHEST WITH CONTRAST TECHNIQUE: Multidetector CT imaging of the chest was performed during intravenous contrast administration. RADIATION DOSE REDUCTION: This exam was performed according to the departmental dose-optimization program which includes automated exposure control, adjustment of the mA and/or kV according to patient size and/or use of iterative reconstruction technique. CONTRAST:  50mL OMNIPAQUE  IOHEXOL  350 MG/ML SOLN COMPARISON:  04/17/2024, 02/26/2024 FINDINGS: Cardiovascular: The heart is unremarkable without pericardial effusion. 4.1 cm ascending thoracic aortic aneurysm. No evidence of dissection. Atherosclerosis of the aorta and coronary vasculature again noted. Mediastinum/Nodes: Stable borderline enlarged mediastinal and left hilar lymph nodes, largest measuring up to 9 mm in short axis. Thyroid , trachea, and esophagus are unremarkable. Lungs/Pleura: Trace bilateral pleural effusions, left greater than right. There is a large thick walled cavitary lesion within the left upper lobe, increased in size since prior study, measuring 11.7 x 6.6 x 4.9 cm. The mural thickening is greatest along the inferior aspect of this lesion, with a small gas fluid level identified. There is direct communication between a left lower lobe subsegmental bronchus and this thick walled cavitary lesion, reference image 57 and 58 of series 4. Upper lobe predominant emphysema again noted. There are bibasilar areas of subpleural scarring and fibrosis  unchanged. Progressive ground-glass airspace disease within the left lower lobe and lingula since prior exam, compatible with worsening inflammation or infection no evidence of pneumothorax. Upper Abdomen: No acute abnormality. Musculoskeletal: No acute or destructive bony abnormalities. Reconstructed images demonstrate no additional findings. IMPRESSION: 1. Increasing size of the thick walled cavitary lesion within the left upper lobe, consistent with progressive cavitary pneumonia. While the soft tissue nodule within the inferior aspect of the lesion is no longer visualized, there has been marked progression in the mural thickening seen along the inferior extent, consistent with progressive infection. There is communication between this cavitary lesion and a subsegmental left upper lobe bronchus as described above. 2. Worsening ground-glass airspace disease within the lingula and left lower lobe, compatible with worsening infection. 3. Trace bilateral pleural effusions, left greater than right. 4. Aortic Atherosclerosis (ICD10-I70.0) and Emphysema (ICD10-J43.9). Electronically Signed   By: Ozell  Delores M.D.   On: 04/17/2024 15:39   DG Abd Portable 1V Result Date: 04/17/2024 CLINICAL DATA:  Nausea, hypoglycemia EXAM: PORTABLE ABDOMEN - 1 VIEW COMPARISON:  Overlapping portions CT chest 02/26/2024 FINDINGS: Notable lower thoracic and lumbar spondylosis. Atherosclerosis is present, including aortoiliac atherosclerotic disease. Moderate degenerative arthropathy of both hips. Scattered gas in small and large bowel without overtly dilated bowel identified. IMPRESSION: 1. Scattered gas in small and large bowel without overtly dilated bowel identified. 2. Lower thoracic and lumbar spondylosis. 3. Moderate degenerative arthropathy of both hips. Electronically Signed   By: Ryan Salvage M.D.   On: 04/17/2024 10:41   DG Chest Port 1 View Result Date: 04/17/2024 EXAM: 1 VIEW XRAY OF THE CHEST 04/17/2024 06:22:11 AM  COMPARISON: 02/21/2024 CLINICAL HISTORY: SOB (shortness of breath) [id number]. Per chart - Hypoglycemia; Alcohol Intoxication; Reason for exam - SOB FINDINGS: LUNGS AND PLEURA: Persistent pleural thickening over the left upper lobe. Persistent cavitary process within the left upper lobe measuring approximately 6.1 cm on today's study. Loculated air overlying the peripheral left upper lobe may reflect underlying bronchopulmonary fistula. New airspace disease is identified within the left base and right mid lung. No pleural effusion. HEART AND MEDIASTINUM: No acute abnormality of the cardiac and mediastinal silhouettes. BONES AND SOFT TISSUES: No acute osseous abnormality. Surgical clips at GE junction. IMPRESSION: 1. Persistent cavitary process within the left upper lobe, measuring approximately 6.1 cm, with associated pleural thickening. 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 2. New airspace disease within the left base and right mid lung. 3. No pleural effusion. Electronically signed by: Waddell Calk MD 04/17/2024 06:37 AM EDT RP Workstation: [id number]     Signature  -   Lavada Stank M.D on 04/18/2024 at 10:05 AM   -  To page go to [email or web address]

## 2024-04-19 ENCOUNTER — Encounter (HOSPITAL_COMMUNITY)
Admission: EM | Disposition: A | Discharge status: Skilled Nursing Facility | Payer: Self-pay | Source: Home / Self Care | Attending: Internal Medicine

## 2024-04-19 ENCOUNTER — Inpatient Hospital Stay (HOSPITAL_COMMUNITY): Admitting: Anesthesiology

## 2024-04-19 DIAGNOSIS — A419 Sepsis, unspecified organism: Secondary | ICD-10-CM | POA: Diagnosis not present

## 2024-04-19 DIAGNOSIS — R911 Solitary pulmonary nodule: Secondary | ICD-10-CM

## 2024-04-19 DIAGNOSIS — E871 Hypo-osmolality and hyponatremia: Secondary | ICD-10-CM | POA: Diagnosis not present

## 2024-04-19 DIAGNOSIS — F1010 Alcohol abuse, uncomplicated: Secondary | ICD-10-CM | POA: Diagnosis not present

## 2024-04-19 HISTORY — PX: FLEXIBLE BRONCHOSCOPY: SHX5094

## 2024-04-19 LAB — COMPREHENSIVE METABOLIC PANEL WITH GFR
ALT: 14 U/L (ref 0–44)
AST: 21 U/L (ref 15–41)
Albumin: 1.6 g/dL — ABNORMAL LOW (ref 3.5–5.0)
Alkaline Phosphatase: 54 U/L (ref 38–126)
Anion gap: 5 (ref 5–15)
BUN: 5 mg/dL — ABNORMAL LOW (ref 8–23)
CO2: 20 mmol/L — ABNORMAL LOW (ref 22–32)
Calcium: 7.6 mg/dL — ABNORMAL LOW (ref 8.9–10.3)
Chloride: 105 mmol/L (ref 98–111)
Creatinine, Ser: 0.5 mg/dL — ABNORMAL LOW (ref 0.61–1.24)
GFR, Estimated: >60 mL/min (ref >60)
Glucose, Bld: 120 mg/dL — ABNORMAL HIGH (ref 70–99)
Potassium: 3.7 mmol/L (ref 3.5–5.1)
Sodium: 130 mmol/L — ABNORMAL LOW (ref 135–145)
Total Bilirubin: 0.3 mg/dL (ref 0.0–1.2)
Total Protein: 4.9 g/dL — ABNORMAL LOW (ref 6.5–8.1)

## 2024-04-19 LAB — PROCALCITONIN: Procalcitonin: 2.11 ng/mL

## 2024-04-19 LAB — CBC WITH DIFFERENTIAL/PLATELET
Abs Immature Granulocytes: 0.09 K/uL — ABNORMAL HIGH (ref 0.00–0.07)
Basophils Absolute: 0.1 K/uL (ref 0.0–0.1)
Basophils Relative: 1 %
Eosinophils Absolute: 0.1 K/uL (ref 0.0–0.5)
Eosinophils Relative: 1 %
HCT: 22.7 % — ABNORMAL LOW (ref 39.0–52.0)
Hemoglobin: 7.9 g/dL — ABNORMAL LOW (ref 13.0–17.0)
Immature Granulocytes: 1 %
Lymphocytes Relative: 14 %
Lymphs Abs: 1.6 K/uL (ref 0.7–4.0)
MCH: 33.8 pg (ref 26.0–34.0)
MCHC: 34.8 g/dL (ref 30.0–36.0)
MCV: 97 fL (ref 80.0–100.0)
Monocytes Absolute: 0.9 K/uL (ref 0.1–1.0)
Monocytes Relative: 8 %
Neutro Abs: 8.6 K/uL — ABNORMAL HIGH (ref 1.7–7.7)
Neutrophils Relative %: 75 %
Platelets: 219 K/uL (ref 150–400)
RBC: 2.34 MIL/uL — ABNORMAL LOW (ref 4.22–5.81)
RDW: 13.7 % (ref 11.5–15.5)
WBC: 11.4 K/uL — ABNORMAL HIGH (ref 4.0–10.5)
nRBC: 0 % (ref 0.0–0.2)

## 2024-04-19 LAB — BODY FLUID CELL COUNT WITH DIFFERENTIAL
Eos, Fluid: 0 %
Lymphs, Fluid: 1 %
Monocyte-Macrophage-Serous Fluid: 5 % — ABNORMAL LOW (ref 50–90)
Neutrophil Count, Fluid: 94 % — ABNORMAL HIGH (ref 0–25)
Total Nucleated Cell Count, Fluid: 1350 uL — ABNORMAL HIGH (ref 0–1000)

## 2024-04-19 LAB — C-REACTIVE PROTEIN: CRP: 13.2 mg/dL — ABNORMAL HIGH (ref <1.0)

## 2024-04-19 LAB — PHOSPHORUS: Phosphorus: 2 mg/dL — ABNORMAL LOW (ref 2.5–4.6)

## 2024-04-19 LAB — GLUCOSE, CAPILLARY
Glucose-Capillary: 105 mg/dL — ABNORMAL HIGH (ref 70–99)
Glucose-Capillary: 187 mg/dL — ABNORMAL HIGH (ref 70–99)
Glucose-Capillary: 319 mg/dL — ABNORMAL HIGH (ref 70–99)
Glucose-Capillary: 90 mg/dL (ref 70–99)

## 2024-04-19 LAB — AMMONIA: Ammonia: 31 umol/L (ref 9–35)

## 2024-04-19 LAB — MAGNESIUM: Magnesium: 1.5 mg/dL — ABNORMAL LOW (ref 1.7–2.4)

## 2024-04-19 SURGERY — BRONCHOSCOPY, FLEXIBLE
Anesthesia: General | Laterality: Bilateral

## 2024-04-19 MED ORDER — MAGNESIUM SULFATE 4 GM/100ML IV SOLN
4.0000 g | Freq: Once | INTRAVENOUS | Status: AC
  Administered 2024-04-19: 4 g via INTRAVENOUS
  Filled 2024-04-19: qty 100

## 2024-04-19 MED ORDER — LORAZEPAM 1 MG PO TABS
1.0000 mg | ORAL_TABLET | ORAL | Status: DC | PRN
Start: 2024-04-19 — End: 2024-04-22
  Administered 2024-04-20: 2 mg via ORAL
  Filled 2024-04-19: qty 2

## 2024-04-19 MED ORDER — PROPOFOL 10 MG/ML IV BOLUS
INTRAVENOUS | Status: DC | PRN
  Administered 2024-04-19: 170 mg via INTRAVENOUS

## 2024-04-19 MED ORDER — INSULIN ASPART 100 UNIT/ML IJ SOLN
0.0000 [IU] | Freq: Three times a day (TID) | INTRAMUSCULAR | Status: DC
  Administered 2024-04-20: 2 [IU] via SUBCUTANEOUS

## 2024-04-19 MED ORDER — LORAZEPAM 2 MG/ML IJ SOLN
1.0000 mg | INTRAMUSCULAR | Status: DC | PRN
Start: 2024-04-19 — End: 2024-04-22

## 2024-04-19 MED ORDER — POTASSIUM CHLORIDE CRYS ER 20 MEQ PO TBCR
20.0000 meq | EXTENDED_RELEASE_TABLET | Freq: Once | ORAL | Status: AC
  Administered 2024-04-19: 20 meq via ORAL
  Filled 2024-04-19: qty 1

## 2024-04-19 MED ORDER — INSULIN ASPART 100 UNIT/ML IJ SOLN: 0.0000 [IU] | Freq: Every day | INTRAMUSCULAR | Status: DC

## 2024-04-19 MED ORDER — POTASSIUM PHOSPHATES 15 MMOLE/5ML IV SOLN
30.0000 mmol | Freq: Once | INTRAVENOUS | Status: AC
  Administered 2024-04-19: 30 mmol via INTRAVENOUS
  Filled 2024-04-19: qty 10

## 2024-04-19 MED ORDER — DEXAMETHASONE SODIUM PHOSPHATE 10 MG/ML IJ SOLN
INTRAMUSCULAR | Status: DC | PRN
  Administered 2024-04-19: 10 mg via INTRAVENOUS

## 2024-04-19 MED ORDER — FENTANYL CITRATE (PF) 100 MCG/2ML IJ SOLN: 25.0000 ug | INTRAMUSCULAR | Status: DC | PRN

## 2024-04-19 MED ORDER — PHENYLEPHRINE 80 MCG/ML (10ML) SYRINGE FOR IV PUSH (FOR BLOOD PRESSURE SUPPORT)
PREFILLED_SYRINGE | INTRAVENOUS | Status: DC | PRN
  Administered 2024-04-19: 80 ug via INTRAVENOUS
  Administered 2024-04-19: 160 ug via INTRAVENOUS

## 2024-04-19 MED ORDER — SUCCINYLCHOLINE CHLORIDE 200 MG/10ML IV SOSY
PREFILLED_SYRINGE | INTRAVENOUS | Status: DC | PRN
  Administered 2024-04-19: 100 mg via INTRAVENOUS

## 2024-04-19 MED ORDER — LACTATED RINGERS IV SOLN: INTRAVENOUS | Status: AC

## 2024-04-19 MED ORDER — ROCURONIUM BROMIDE 10 MG/ML (PF) SYRINGE
PREFILLED_SYRINGE | INTRAVENOUS | Status: DC | PRN
  Administered 2024-04-19: 50 mg via INTRAVENOUS

## 2024-04-19 MED ORDER — ONDANSETRON HCL 4 MG/2ML IJ SOLN
INTRAMUSCULAR | Status: DC | PRN
  Administered 2024-04-19: 4 mg via INTRAVENOUS

## 2024-04-19 MED ORDER — SUGAMMADEX SODIUM 200 MG/2ML IV SOLN
INTRAVENOUS | Status: DC | PRN
  Administered 2024-04-19: 136 mg via INTRAVENOUS

## 2024-04-19 NOTE — Anesthesia Preprocedure Evaluation (Addendum)
 Anesthesia Evaluation  Patient identified by MRN, date of birth, ID band Patient awake    Reviewed: Allergy & Precautions, H&P , NPO status , Patient's Chart, lab work & pertinent test results  Airway Mallampati: II  TM Distance: >3 FB Neck ROM: Full    Dental no notable dental hx. (+) Edentulous Upper, Edentulous Lower, Dental Advisory Given   Pulmonary Current Smoker and Patient abstained from smoking.   Pulmonary exam normal breath sounds clear to auscultation       Cardiovascular negative cardio ROS  Rhythm:Regular Rate:Normal     Neuro/Psych negative neurological ROS  negative psych ROS   GI/Hepatic PUD,,,(+)     substance abuse  alcohol use  Endo/Other  negative endocrine ROS    Renal/GU negative Renal ROS  negative genitourinary   Musculoskeletal  (+) Arthritis , Osteoarthritis,    Abdominal   Peds  Hematology  (+) Blood dyscrasia, anemia   Anesthesia Other Findings   Reproductive/Obstetrics negative OB ROS                              Anesthesia Physical Anesthesia Plan  ASA: 2  Anesthesia Plan: General   Post-op Pain Management: Minimal or no pain anticipated   Induction: Intravenous  PONV Risk Score and Plan: 2 and Ondansetron , Dexamethasone , Propofol  infusion and TIVA  Airway Management Planned: Oral ETT  Additional Equipment:   Intra-op Plan:   Post-operative Plan: Extubation in OR  Informed Consent: I have reviewed the patients History and Physical, chart, labs and discussed the procedure including the risks, benefits and alternatives for the proposed anesthesia with the patient or authorized representative who has indicated his/her understanding and acceptance.     Dental advisory given  Plan Discussed with: CRNA  Anesthesia Plan Comments:          Anesthesia Quick Evaluation

## 2024-04-19 NOTE — Plan of Care (Signed)

## 2024-04-19 NOTE — Progress Notes (Signed)
 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 PMH - etoh abuse, DVT, htn, cervical stenosis, partial gastrectomy    PT Comments  The pt is making gradual progress, now ambulating up to ~15 ft with a rollator and min-modA. However, he remains at risk for falls, intermittently leaning posteriorly when standing and displaying deficits in strength, balance, power, and endurance. He also displays deficits in cognition as he was unaware or unbothered by being saturated in his urine in bed upon arrival and being unaware of his need to have a BM until it is already occurring while standing/ambulating. Will continue to follow acutely.     If plan is discharge home, recommend the following: A lot of help with walking and/or transfers;A lot of help with bathing/dressing/bathroom;Assistance with cooking/housework;Supervision due to cognitive status;Assist for transportation   Can travel by private vehicle     Yes  Equipment Recommendations  None recommended by PT    Recommendations for Other Services       Precautions / Restrictions Precautions Precautions: Fall Precaution/Restrictions Comments: bowel incontinence Restrictions Weight Bearing Restrictions Per Provider Order: No     Mobility  Bed Mobility Overal bed mobility: Needs Assistance Bed Mobility: Supine to Sit, Sit to Supine     Supine to sit: Mod assist, HOB elevated, Used rails Sit to supine: Contact guard assist   General bed mobility comments: Pt needed cues to grab R rail with R UE and push up on R elbow while pulling up on therapist with L UE to sit up R EOB, modA at trunk. Pt able to return himself to supine with CGA for safety    Transfers Overall transfer level: Needs assistance Equipment used: Rollator (4  wheels) Transfers: Sit to/from Stand Sit to Stand: Mod assist           General transfer comment: Pt needed modA to power up to stand and gain balance, transferring from EOB to rollator. Pt maintained a flexed posture and posterior lean initially, needing multi-modal cues to correct.    Ambulation/Gait Ambulation/Gait assistance: Min assist, Mod assist Gait Distance (Feet): 15 Feet Assistive device: Rollator (4 wheels) Gait Pattern/deviations: Step-to pattern, Decreased step length - right, Decreased step length - left, Decreased stride length, Shuffle, Trunk flexed, Leaning posteriorly Gait velocity: reduced Gait velocity interpretation: <1.31 ft/sec, indicative of household ambulator   General Gait Details: Pt ambulates with slow, small, shuffling steps and a flexed posture with intermittent posterior lean. Pt needing verbal and tactile cues to take longer steps and stand upright. Cues also needed to reduce posterior lean. Min-modA for balance to step anterior <> posterior at EOB. Limited distance by pt fatigue and frequent bowel incontinence   Stairs             Wheelchair Mobility     Tilt Bed    Modified Rankin (Stroke Patients Only)       Balance Overall balance assessment: Needs assistance Sitting-balance support: No upper extremity supported, Feet supported Sitting balance-Leahy Scale: Fair Sitting balance - Comments: sits statically EOB with CGA-supervision for safety Postural control: Posterior lean Standing balance support: Bilateral upper extremity supported, During functional activity, Reliant on assistive device for balance Standing balance-Leahy Scale: Poor Standing balance comment: reliant on UE support and up to modA, intermittent posterior lean noted  Communication Communication Communication: No apparent difficulties  Cognition Arousal: Alert Behavior During Therapy: WFL for tasks assessed/performed   PT  - Cognitive impairments: No family/caregiver present to determine baseline, Memory, Safety/Judgement, Problem solving                       PT - Cognition Comments: Pt needed repeated cues to step forward to allow therapist to reach behind to fix bed. Pt self limiting, needing encouragement to progress mobility. Pt seemed unbother or unaware he was saturated in urine in bed upon arrival and pt was unaware of his bowel incontinence while standing, not realizing or expressing the need to have a BM until already going. Following commands: Impaired Following commands impaired: Follows one step commands with increased time    Cueing Cueing Techniques: Tactile cues, Verbal cues  Exercises      General Comments        Pertinent Vitals/Pain Pain Assessment Pain Assessment: Faces Faces Pain Scale: Hurts little more Pain Location: generalized Pain Descriptors / Indicators: Discomfort, Grimacing, Guarding Pain Intervention(s): Limited activity within patient's tolerance, Monitored during session, Repositioned    Home Living                          Prior Function            PT Goals (current goals can now be found in the care plan section) Acute Rehab PT Goals Patient Stated Goal: get something to eat PT Goal Formulation: With patient Time For Goal Achievement: 05/01/24 Potential to Achieve Goals: Good Progress towards PT goals: Progressing toward goals    Frequency    Min 2X/week      PT Plan      Co-evaluation              AM-PAC PT 6 Clicks Mobility   Outcome Measure  Help needed turning from your back to your side while in a flat bed without using bedrails?: A Little Help needed moving from lying on your back to sitting on the side of a flat bed without using bedrails?: A Lot Help needed moving to and from a bed to a chair (including a wheelchair)?: A Lot Help needed standing up from a chair using your arms (e.g., wheelchair or bedside  chair)?: A Lot Help needed to walk in hospital room?: Total (<20 ft) Help needed climbing 3-5 steps with a railing? : Total 6 Click Score: 11    End of Session Equipment Utilized During Treatment: Gait belt Activity Tolerance: Patient tolerated treatment well;Patient limited by fatigue;Other (comment) (limited by bowel incontinence) Patient left: in bed;with call bell/phone within reach;with bed alarm set Nurse Communication: Other (comment) (NT/nursing student notified of condom cath off upon PT arrival) PT Visit Diagnosis: Unsteadiness on feet (R26.81);Other abnormalities of gait and mobility (R26.89);Muscle weakness (generalized) (M62.81);Difficulty in walking, not elsewhere classified (R26.2)     Time: 9057-8985 PT Time Calculation (min) (ACUTE ONLY): 32 min  Charges:    $Gait Training: 8-22 mins $Therapeutic Activity: 8-22 mins PT General Charges $$ ACUTE PT VISIT: 1 Visit                     Theo Ferretti, PT, DPT Acute Rehabilitation Services  Office: [phone]    Theo CHRISTELLA Ferretti 04/19/2024, 4:31 PM

## 2024-04-19 NOTE — Progress Notes (Signed)
 NAME:  Cody Glenn, MRN:  [id number], DOB:  1955/11/13, LOS: 3 ADMISSION DATE:  04/15/2024, CONSULTATION DATE:  04/17/24 REFERRING MD:  Dennise, CHIEF COMPLAINT:  abnormal chest xr   History of Present Illness:  68 yo M PMH etoh abuse, dvt, DM, HTN, cavitary lung lesion who was admitted to [facility] 04/16/24 after presenting to ED for AMS. He was found to be hypoglycemic which was addressed, and have AoC hyponatremia for which nephrology was consulted.  A CXR was obtained 9/10 which reveals LUL cavitary lesion, and air concerning for possible BPF.    PCCM consulted in this setting   He has a known LUL cavitary lesion that was worked up on prior admission, and was dc in July on 3wk augmentin . He reports to me he completed this. No resp complaints. Does endorse frequent coughing when he swallows thin liquids, specifically noting that he coughs a lot when he drinks alcohol.   Pertinent  Medical History  Etoh abuse  Cavitary lung lesion Malnutrition Dvt HTN   Significant Hospital Events: Including procedures, antibiotic start and stop dates in addition to other pertinent events   9/9 admitted 9/10 pulm consult, GI panel positive for EPEC and Shigella 9/11 CT Surgery consult, no need for surgery at this time  Interim History / Subjective:    He has been NPO for bronchoscopy today Reviewed procedure and plan, he has agreed to move forward.  Objective    Blood pressure 129/84, pulse 68, temperature 98.6 F (37 C), temperature source Temporal, resp. rate (!) 21, height 5' 5 (1.651 m), weight 68 kg, SpO2 100%.        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  Suspected dysphagia  EPEC/Shigella gastroenteritis  P -patient agreeable to bronchoscopy with BAL for updated respiratory cultures - plan for bronchoscopy today, he is NPO -MRSA PCR is negative - he is on azithromycin  for gastroenteritis and Unasyn  for cavitary lung lesion   Labs   CBC: Recent Labs  Lab 04/15/24 2045 04/16/24 0743 04/16/24 2328 04/17/24 0807 04/18/24 0253 04/19/24 0340  WBC 15.2* 10.3 10.2 12.6* 15.2* 11.4*  NEUTROABS 12.7*  --   --  11.0* 12.4* 8.6*  HGB 12.1* 9.5* 9.5* 10.5* 8.4* 7.9*  HCT 33.9* 26.0* 25.9* 29.4* 23.2* 22.7*  MCV 93.4 91.9 92.5 94.2 95.1 97.0  PLT 277 261 261 288 202 219    Basic Metabolic Panel: Recent Labs  Lab 04/16/24 0743 04/16/24 1101 04/16/24 2328 04/17/24 0807 04/17/24 1442 04/17/24 2013 04/18/24 0253 04/19/24 0340  NA 115*   < > 121* 124* 121* 126* 130* 130*  K 3.7   < > 3.5 3.7 3.1* 3.5 3.3* 3.7  CL 82*   < > 91* 90* 94* 96* 100 105  CO2 20*   < > 21* 23 18* 19* 22 20*  GLUCOSE 88   < > 104* 86 216* 97 101* 120*  BUN 7*   < >  8 8 7* 7* 5* 5*  CREATININE 0.44*   < > 0.59* 0.59* 0.75 0.70 0.56* 0.50*  CALCIUM  8.0*   < > 7.8* 8.2* 7.4* 7.4* 7.7* 7.6*  MG 3.2*  --  1.5*  --  2.0  --  1.8 1.5*  PHOS 2.5  --   --  2.1*  --   --  2.9 2.0*   < > = values in this interval not displayed.   GFR: Estimated Creatinine Clearance: 77.9 mL/min (A) (by C-G formula based on SCr of 0.5 mg/dL (L)). Recent Labs  Lab 04/16/24 2328 04/17/24 0807 04/18/24 0253 04/19/24 0340  PROCALCITON  --   --  6.72 2.11  WBC 10.2 12.6* 15.2* 11.4*    Liver Function Tests: Recent Labs  Lab 04/16/24 0743 04/16/24 2328 04/17/24 0807 04/18/24 0253 04/19/24 0340  AST 35 35 32 22 21  ALT 16 15 17 13 14   ALKPHOS 66 67 71 58 54  BILITOT  1.0 0.9 1.2 0.8 0.3  PROT 5.4* 4.6* 5.9* 4.8* 4.9*  ALBUMIN 2.0* 1.8* 2.2* 1.7* 1.6*   No results for input(s): LIPASE, AMYLASE in the last 168 hours. Recent Labs  Lab 04/17/24 0807 04/18/24 0253 04/19/24 0340  AMMONIA 13 27 31     ABG    Component Value Date/Time   TCO2 20 (L) 04/15/2024 2043     Coagulation Profile: Recent Labs  Lab 04/17/24 0807  INR 1.0    Cardiac Enzymes: No results for input(s): CKTOTAL, CKMB, CKMBINDEX, TROPONINI in the last 168 hours.  HbA1C: Hgb A1c MFr Bld  Date/Time Value Ref Range Status  04/16/2024 11:01 AM 4.1 (L) 4.8 - 5.6 % Final    Comment:    (NOTE) Diagnosis of Diabetes The following HbA1c ranges recommended by the American Diabetes Association (ADA) may be used as an aid in the diagnosis of diabetes mellitus.  Hemoglobin             Suggested A1C NGSP%              Diagnosis  <5.7                   Non Diabetic  5.7-6.4                Pre-Diabetic  >6.4                   Diabetic  <7.0                   Glycemic control for                       adults with diabetes.    10/13/2020 02:08 PM 5.5 4.8 - 5.6 % Final    Comment:             Prediabetes: 5.7 - 6.4          Diabetes: >6.4          Glycemic control for adults with diabetes: <7.0     CBG: Recent Labs  Lab 04/18/24 0700 04/18/24 1220 04/18/24 1852 04/19/24 0000 04/19/24 0427  GLUCAP 182* 109* 173* 90 105*       Critical care time: na    Dorn Chill, MD [facility] Office: [phone]   See Amion for personal pager PCCM on call pager [phone] until 7pm. Please call Elink 7p-7a. [phone]

## 2024-04-19 NOTE — Transfer of Care (Signed)
 Immediate Anesthesia Transfer of Care Note  Patient: Cody Glenn  Procedure(s) Performed: BRONCHOSCOPY, FLEXIBLE (Bilateral)  Patient Location: Endoscopy Unit  Anesthesia Type:General  Level of Consciousness: sedated  Airway & Oxygen Therapy: Patient Spontanous Breathing and Patient connected to face mask oxygen  Post-op Assessment: Report given to RN and Post -op Vital signs reviewed and stable  Post vital signs: Reviewed and stable  Last Vitals:  Vitals Value Taken Time  BP 110/60   Temp 98   Pulse 80   Resp 16   SpO2 100     Last Pain:  Vitals:   04/19/24 1154  TempSrc: Temporal  PainSc: 0-No pain      Patients Stated Pain Goal: 3 (04/18/24 2000)  Complications: No notable events documented.

## 2024-04-19 NOTE — Op Note (Signed)
 Bronchoscopy Procedure Note  Cody Glenn  [id number]  06/03/1956  Date:04/19/24  Time:12:47 PM   Provider Performing:Cody Glenn   Procedure(s):  Flexible bronchoscopy with bronchial alveolar lavage (68375)  Indication(s) Cavitary Lung Lesion  Consent Risks of the procedure as well as the alternatives and risks of each were explained to the patient and/or caregiver.  Consent for the procedure was obtained and is signed in the bedside chart  Anesthesia General   Time Out Verified patient identification, verified procedure, site/side was marked, verified correct patient position, special equipment/implants available, medications/allergies/relevant history reviewed, required imaging and test results available.   Sterile Technique Usual hand hygiene, masks, gowns, and gloves were used   Procedure Description Bronchoscope advanced through endotracheal tube and into airway.  Airways were examined down to subsegmental level with findings noted below.   Following diagnostic evaluation, BAL(s) performed in LUL with normal saline and return of 35mL x 2 fluid  Findings:  - normal appearing airway trees - no secretions or blood noted   Complications/Tolerance None; patient tolerated the procedure well. Chest X-ray is not needed post procedure.   EBL Minimal   Specimen(s) BAL sent for cytology, cell count/differential, AFB, respiratory and fungal cultures.

## 2024-04-19 NOTE — Anesthesia Procedure Notes (Signed)
 Procedure Name: Intubation Date/Time: 04/19/2024 12:30 PM  Performed by: Katrese Shell A, CRNAPre-anesthesia Checklist: Patient identified, Emergency Drugs available, Suction available and Patient being monitored Patient Re-evaluated:Patient Re-evaluated prior to induction Oxygen Delivery Method: Circle System Utilized Preoxygenation: Pre-oxygenation with 100% oxygen Induction Type: IV induction Ventilation: Mask ventilation without difficulty Laryngoscope Size: Mac and 3 Grade View: Grade III Tube type: Oral Tube size: 8.0 mm Number of attempts: 1 Airway Equipment and Method: Stylet and Oral airway Placement Confirmation: ETT inserted through vocal cords under direct vision, positive ETCO2 and breath sounds checked- equal and bilateral Secured at: 23 cm Tube secured with: Tape Dental Injury: Teeth and Oropharynx as per pre-operative assessment

## 2024-04-19 NOTE — Progress Notes (Signed)
 PROGRESS NOTE                                                                                                                                                                                                             Patient Demographics:    Cody Glenn, is a 68 y.o. male, DOB - 01/27/1956, FMW:[id number]  Outpatient Primary MD for the patient is Cody Belvie BRAVO, MD    LOS - 3  Admit date - 04/15/2024    Chief Complaint  Patient presents with   Hypoglycemia   Alcohol Intoxication       Brief Narrative (HPI from H&P)    68 y.o. year old male with past medical history of heavy alcohol use, DVT, PUD, history of partial gastrectomy, active smoking, prediabetes, primary hypertension, cervical spinal stenosis, and homelessness.  He presents to [name], ED after being found by a bystander in a parking lot.  On EMS arrival patient was hypoglycemic and was given an amp of D50.  In the ER workup consistent with hyponatremia, dehydration with multiple electrolyte abnormalities.  Chest x-ray also showed left upper lobe cavitary lesion.   Subjective:   Patient in bed, appears comfortable, denies any headache, no fever, no chest pain or pressure, mild cough, improved shortness of breath , no abdominal pain, improving diarrhea. No new focal weakness.    Assessment  & Plan :    Severe dehydration, hyponatremia, hypomagnesemia, hypocalcemia, hypophosphatemia.  In a patient with ongoing alcohol abuse, homeless. Nephrology on board being gently hydrated, sodium gradually improving, electrolytes have been replaced and will be monitored.   Left lung cavitary lesion with high fever.  Septic.  Homeless, alcoholic, high risk for chronic aspiration, sepsis workup initiated, IV fluids, pancultured, CT chest confirms left-sided cavitary lesion questionable abscess, he has had pulmonary evaluation for the same few weeks ago and had received  antibiotics at that time, currently on Unasyn ,.  Speech and pulmonary consulted.  Continue to monitor.  # Shigella gastroenteritis with diarrhea. 10 days of azithromycin .  ##Protein Calorie Malnutrition  Dietitian consulted.   #Alcohol use disorder Patient reports drinking 6-8 beers daily, reports last drink was yesterday - Patient counseled on importance of cessation - CIWA protocol with PRN symptom triggered Ativan    #Tobacco Use Disorder - Patient counseled on importance of cessation - Nicotine  replacement therapy with  nicotine  patch and gum while inpatient  #Normocytic Anemia Stable   #COPD Not on any outpatient maintenance inhalers -PRN Albuterol    #Hypertension - Patient reports he is not taking Diovan  - BP normotensive, monitor   #Peptic Ulcer Disease - Protonix    #Chronic Neck Pain - Lidocaine  patch - PRN toradol   #Normocytic Anemia Stable   #COPD Not on any outpatient maintenance inhalers -PRN Albuterol    #Hypertension - Patient reports he is not taking Diovan  - BP normotensive, monitor   #Peptic Ulcer Disease - Protonix    #Chronic Neck Pain - Lidocaine  patch - PRN toradol   ##Hypoglycemia - Likely due to poor oral intake, A1c was low, monitor on oral diet, check CBGs.  Check TSH, free T4 and cortisol levels as well.    Lab Results  Component Value Date   HGBA1C 4.1 (L) 04/16/2024   CBG (last 3)  Recent Labs    04/18/24 1852 04/19/24 0000 04/19/24 0427  GLUCAP 173* 90 105*        Condition - Extremely Guarded  Family Communication  : None present  Code Status : Full code  Consults  : Pulmonary, nephrology  PUD Prophylaxis :  PPI   Procedures  :     CT chest with IV contrast ordered 04/17/2024 - 1. Increasing size of the thick walled cavitary lesion within the left upper lobe, consistent with progressive cavitary pneumonia. While the soft tissue nodule within the inferior aspect of the lesion is no longer visualized, there has  been marked progression in the mural thickening seen along the inferior extent, consistent with progressive infection. There is communication between this cavitary lesion and a subsegmental left upper lobe bronchus as described above. 2. Worsening ground-glass airspace disease within the lingula and left lower lobe, compatible with worsening infection. 3. Trace bilateral pleural effusions, left greater than right. 4. Aortic Atherosclerosis (ICD10-I70.0) and Emphysema (ICD10-J43.9).       Disposition Plan  :    Status is: Inpatient  DVT Prophylaxis  :    heparin  injection 5,000 Units Start: 04/17/24 1400 Place and maintain sequential compression device Start: 04/16/24 1157    Lab Results  Component Value Date   PLT 219 04/19/2024    Diet :  Diet Order             Diet NPO time specified  Diet effective midnight                    Inpatient Medications  Scheduled Meds:  azithromycin   500 mg Oral Daily   feeding supplement  1 Container Oral TID BM   folic acid   1 mg Oral Daily   heparin  injection (subcutaneous)  5,000 Units Subcutaneous Q8H   lidocaine   1 patch Transdermal Q24H   multivitamin with minerals  1 tablet Oral Daily   nicotine   14 mg Transdermal Daily   pantoprazole   40 mg Oral Daily   sodium chloride   1 g Oral TID WC   thiamine   100 mg Oral Daily   Or   thiamine   100 mg Intravenous Daily   Continuous Infusions:  ampicillin -sulbactam (UNASYN ) IV 3 g (04/19/24 0647)   lactated ringers      potassium PHOSPHATE  IVPB (in mmol)     PRN Meds:.acetaminophen  **OR** acetaminophen , albuterol , dextrose , ketorolac , LORazepam  **OR** LORazepam , melatonin, nicotine  polacrilex, ondansetron  **OR** ondansetron  (ZOFRAN ) IV, polyethylene glycol  Antibiotics  :    Anti-infectives (From admission, onward)    Start     Dose/Rate Route Frequency Ordered  Stop   04/18/24 1100  azithromycin  (ZITHROMAX ) tablet 500 mg        500 mg Oral Daily 04/18/24 1002 04/28/24 0959    04/17/24 1200  Ampicillin -Sulbactam (UNASYN ) 3 g in sodium chloride  0.9 % 100 mL IVPB        3 g 200 mL/hr over 30 Minutes Intravenous Every 6 hours 04/17/24 1025           Objective:   Vitals:   04/18/24 1900 04/19/24 0434 04/19/24 0744 04/19/24 0759  BP: 121/80 132/85 (!) 142/92 (!) 146/97  Pulse: 92 76 74 74  Resp: (!) 25 19 (!) 23 (!) 23  Temp: 98.5 F (36.9 C) 98.7 F (37.1 C) 97.6 F (36.4 C) 97.6 F (36.4 C)  TempSrc: Oral Oral Oral Oral  SpO2: 100% 97% 100% 100%  Weight:      Height:        Wt Readings from Last 3 Encounters:  04/15/24 68 kg  03/18/24 69 kg  02/20/24 69 kg     Intake/Output Summary (Last 24 hours) at 04/19/2024 0909 Last data filed at 04/18/2024 1600 Gross per 24 hour  Intake 520.5 ml  Output 400 ml  Net 120.5 ml     Physical Exam  Awake Alert, No new F.N deficits, Normal affect Wilson.AT,PERRAL Supple Neck, No JVD,   Symmetrical Chest wall movement, coarse bilateral breath sounds RRR,No Gallops,Rubs or new Murmurs,  +ve B.Sounds, Abd Soft, No tenderness,   No Cyanosis, Clubbing or edema        Data Review:    Recent Labs  Lab 04/15/24 2045 04/16/24 0743 04/16/24 2328 04/17/24 0807 04/18/24 0253 04/19/24 0340  WBC 15.2* 10.3 10.2 12.6* 15.2* 11.4*  HGB 12.1* 9.5* 9.5* 10.5* 8.4* 7.9*  HCT 33.9* 26.0* 25.9* 29.4* 23.2* 22.7*  PLT 277 261 261 288 202 219  MCV 93.4 91.9 92.5 94.2 95.1 97.0  MCH 33.3 33.6 33.9 33.7 34.4* 33.8  MCHC 35.7 36.5* 36.7* 35.7 36.2* 34.8  RDW 12.6 12.7 12.7 12.9 13.2 13.7  LYMPHSABS 1.4  --   --  0.8 1.3 1.6  MONOABS 0.9  --   --  0.6 1.2* 0.9  EOSABS 0.0  --   --  0.0 0.0 0.1  BASOSABS 0.0  --   --  0.0 0.1 0.1    Recent Labs  Lab 04/16/24 0743 04/16/24 1101 04/16/24 1610 04/16/24 2328 04/17/24 0807 04/17/24 1059 04/17/24 1442 04/17/24 2013 04/18/24 0253 04/19/24 0340  NA 115* 118*   < > 121* 124*  --  121* 126* 130* 130*  K 3.7  --    < > 3.5 3.7  --  3.1* 3.5 3.3* 3.7  CL 82*  --     < > 91* 90*  --  94* 96* 100 105  CO2 20*  --    < > 21* 23  --  18* 19* 22 20*  ANIONGAP 13  --    < > 9 11  --  9 11 8 5   GLUCOSE 88  --    < > 104* 86  --  216* 97 101* 120*  BUN 7*  --    < > 8 8  --  7* 7* 5* 5*  CREATININE 0.44*  --    < > 0.59* 0.59*  --  0.75 0.70 0.56* 0.50*  AST 35  --   --  35 32  --   --   --  22 21  ALT 16  --   --  15 17  --   --   --  13 14  ALKPHOS 66  --   --  67 71  --   --   --  58 54  BILITOT 1.0  --   --  0.9 1.2  --   --   --  0.8 0.3  ALBUMIN 2.0*  --   --  1.8* 2.2*  --   --   --  1.7* 1.6*  CRP  --   --   --   --   --   --   --   --  12.6* 13.2*  PROCALCITON  --   --   --   --   --   --   --   --  6.72 2.11  INR  --   --   --   --  1.0  --   --   --   --   --   TSH 0.880  --   --   --   --  0.639  --   --   --   --   HGBA1C  --  4.1*  --   --   --   --   --   --   --   --   AMMONIA  --   --   --   --  <13  --   --   --  27 31  MG 3.2*  --   --  1.5*  --   --  2.0  --  1.8 1.5*  PHOS 2.5  --   --   --  2.1*  --   --   --  2.9 2.0*  CALCIUM  8.0*  --    < > 7.8* 8.2*  --  7.4* 7.4* 7.7* 7.6*   < > = values in this interval not displayed.      Recent Labs  Lab 04/16/24 0743 04/16/24 1101 04/16/24 1610 04/16/24 2328 04/17/24 0807 04/17/24 1059 04/17/24 1442 04/17/24 2013 04/18/24 0253 04/19/24 0340  CRP  --   --   --   --   --   --   --   --  12.6* 13.2*  PROCALCITON  --   --   --   --   --   --   --   --  6.72 2.11  INR  --   --   --   --  1.0  --   --   --   --   --   TSH 0.880  --   --   --   --  0.639  --   --   --   --   HGBA1C  --  4.1*  --   --   --   --   --   --   --   --   AMMONIA  --   --   --   --  <13  --   --   --  27 31  MG 3.2*  --   --  1.5*  --   --  2.0  --  1.8 1.5*  CALCIUM  8.0*  --    < > 7.8* 8.2*  --  7.4* 7.4* 7.7* 7.6*   < > = values in this interval not displayed.    --------------------------------------------------------------------------------------------------------------- Lab Results  Component  Value Date   CHOL 213 (H) 10/13/2020   HDL 106 10/13/2020  LDLCALC 93 10/13/2020   TRIG 84 10/13/2020   CHOLHDL 2.0 10/13/2020    Lab Results  Component Value Date   HGBA1C 4.1 (L) 04/16/2024   Recent Labs    04/17/24 1059 04/17/24 1442  TSH 0.639  --   FREET4  --  1.20*   No results for input(s): VITAMINB12, FOLATE, FERRITIN, TIBC, IRON, RETICCTPCT in the last 72 hours. ------------------------------------------------------------------------------------------------------------------ Cardiac Enzymes No results for input(s): CKMB, TROPONINI, MYOGLOBIN in the last 168 hours.  Invalid input(s): CK  Micro Results Recent Results (from the past 240 hours)  Respiratory (~20 pathogens) panel by PCR     Status: None   Collection Time: 04/17/24 10:02 AM   Specimen: Nasopharyngeal Swab; Respiratory  Result Value Ref Range Status   Adenovirus NOT DETECTED NOT DETECTED Final   Coronavirus 229E NOT DETECTED NOT DETECTED Final    Comment: (NOTE) The Coronavirus on the Respiratory Panel, DOES NOT test for the novel  Coronavirus (2019 nCoV)    Coronavirus HKU1 NOT DETECTED NOT DETECTED Final   Coronavirus NL63 NOT DETECTED NOT DETECTED Final   Coronavirus OC43 NOT DETECTED NOT DETECTED Final   Metapneumovirus NOT DETECTED NOT DETECTED Final   Rhinovirus / Enterovirus NOT DETECTED NOT DETECTED Final   Influenza A NOT DETECTED NOT DETECTED Final   Influenza B NOT DETECTED NOT DETECTED Final   Parainfluenza Virus 1 NOT DETECTED NOT DETECTED Final   Parainfluenza Virus 2 NOT DETECTED NOT DETECTED Final   Parainfluenza Virus 3 NOT DETECTED NOT DETECTED Final   Parainfluenza Virus 4 NOT DETECTED NOT DETECTED Final   Respiratory Syncytial Virus NOT DETECTED NOT DETECTED Final   Bordetella pertussis NOT DETECTED NOT DETECTED Final   Bordetella Parapertussis NOT DETECTED NOT DETECTED Final   Chlamydophila pneumoniae NOT DETECTED NOT DETECTED Final   Mycoplasma  pneumoniae NOT DETECTED NOT DETECTED Final    Comment: Performed at [facility], [address], [place], [place] [address]  SARS Coronavirus 2 by RT PCR (hospital order, performed in [facility] hospital lab) *cepheid single result test* Nasal Mucosa     Status: None   Collection Time: 04/17/24 10:02 AM   Specimen: Nasal Mucosa; Nasal Swab  Result Value Ref Range Status   SARS Coronavirus 2 by RT PCR NEGATIVE NEGATIVE Final    Comment: Performed at [facility], [address], [place], [place] [address]  Culture, blood (Routine X 2) w Reflex to ID Panel     Status: None (Preliminary result)   Collection Time: 04/17/24 10:55 AM   Specimen: BLOOD LEFT ARM  Result Value Ref Range Status   Specimen Description BLOOD LEFT ARM  Final   Special Requests   Final    BOTTLES DRAWN AEROBIC AND ANAEROBIC Blood Culture results may not be optimal due to an inadequate volume of blood received in culture bottles   Culture   Final    NO GROWTH < 12 HOURS Performed at [facility], [address] [place], [place] [address]    Report Status PENDING  Incomplete  Culture, blood (Routine X 2) w Reflex to ID Panel     Status: None (Preliminary result)   Collection Time: 04/17/24 10:59 AM   Specimen: BLOOD  Result Value Ref Range Status   Specimen Description BLOOD SITE NOT SPECIFIED  Final   Special Requests   Final    BOTTLES DRAWN AEROBIC AND ANAEROBIC Blood Culture results may not be optimal due to an inadequate volume of blood  received in culture bottles   Culture   Final    NO GROWTH < 24 HOURS Performed at [facility], [address] [place], [place] [address]    Report Status PENDING  Incomplete  Gastrointestinal Panel by PCR , Stool     Status: Abnormal   Collection Time: 04/18/24 12:38 AM   Specimen: Stool  Result Value Ref Range Status   Campylobacter species NOT DETECTED NOT DETECTED Final   Plesimonas shigelloides NOT DETECTED NOT DETECTED Final   Salmonella  species NOT DETECTED NOT DETECTED Final   Yersinia enterocolitica NOT DETECTED NOT DETECTED Final   Vibrio species NOT DETECTED NOT DETECTED Final   Vibrio cholerae NOT DETECTED NOT DETECTED Final   Enteroaggregative E coli (EAEC) NOT DETECTED NOT DETECTED Final   Enteropathogenic E coli (EPEC) DETECTED (A) NOT DETECTED Final    Comment: RESULT CALLED TO, READ BACK BY AND VERIFIED WITH: KEDRIC DARING RN [phone] 04/18/24 HNM    Enterotoxigenic E coli (ETEC) NOT DETECTED NOT DETECTED Final   Shiga like toxin producing E coli (STEC) NOT DETECTED NOT DETECTED Final   Shigella/Enteroinvasive E coli (EIEC) DETECTED (A) NOT DETECTED Final    Comment: RESULT CALLED TO, READ BACK BY AND VERIFIED WITH: KEDRIC DARING RN [phone] 04/18/24 HNM    Cryptosporidium NOT DETECTED NOT DETECTED Final   Cyclospora cayetanensis NOT DETECTED NOT DETECTED Final   Entamoeba histolytica NOT DETECTED NOT DETECTED Final   Giardia lamblia NOT DETECTED NOT DETECTED Final   Adenovirus F40/41 NOT DETECTED NOT DETECTED Final   Astrovirus NOT DETECTED NOT DETECTED Final   Norovirus GI/GII NOT DETECTED NOT DETECTED Final   Rotavirus A NOT DETECTED NOT DETECTED Final   Sapovirus (I, II, IV, and V) NOT DETECTED NOT DETECTED Final    Comment: Performed at [facility], [address] [place], [place] [address]  MRSA Next Gen by PCR, Nasal     Status: None   Collection Time: 04/18/24  5:45 AM   Specimen: Nasal Mucosa; Nasal Swab  Result Value Ref Range Status   MRSA by PCR Next Gen NOT DETECTED NOT DETECTED Final    Comment: (NOTE) The GeneXpert MRSA Assay (FDA approved for NASAL specimens only), is one component of a comprehensive MRSA colonization surveillance program. It is not intended to diagnose MRSA infection nor to guide or monitor treatment for MRSA infections. Test performance is not FDA approved in patients less than 23 years old. Performed at [facility], [address] [place],  [place] [address]     Radiology Report CT CHEST W CONTRAST Result Date: 04/17/2024 CLINICAL DATA:  Pneumonia, complication suspected, xray done, short of breath EXAM: CT CHEST WITH CONTRAST TECHNIQUE: Multidetector CT imaging of the chest was performed during intravenous contrast administration. RADIATION DOSE REDUCTION: This exam was performed according to the departmental dose-optimization program which includes automated exposure control, adjustment of the mA and/or kV according to patient size and/or use of iterative reconstruction technique. CONTRAST:  50mL OMNIPAQUE  IOHEXOL  350 MG/ML SOLN COMPARISON:  04/17/2024, 02/26/2024 FINDINGS: Cardiovascular: The heart is unremarkable without pericardial effusion. 4.1 cm ascending thoracic aortic aneurysm. No evidence of dissection. Atherosclerosis of the aorta and coronary vasculature again noted. Mediastinum/Nodes: Stable borderline enlarged mediastinal and left hilar lymph nodes, largest measuring up to 9 mm in short axis. Thyroid , trachea, and esophagus are unremarkable. Lungs/Pleura: Trace bilateral pleural effusions, left greater than right. There is a large thick walled cavitary lesion within the left upper lobe, increased  in size since prior study, measuring 11.7 x 6.6 x 4.9 cm. The mural thickening is greatest along the inferior aspect of this lesion, with a small gas fluid level identified. There is direct communication between a left lower lobe subsegmental bronchus and this thick walled cavitary lesion, reference image 57 and 58 of series 4. Upper lobe predominant emphysema again noted. There are bibasilar areas of subpleural scarring and fibrosis unchanged. Progressive ground-glass airspace disease within the left lower lobe and lingula since prior exam, compatible with worsening inflammation or infection no evidence of pneumothorax. Upper Abdomen: No acute abnormality. Musculoskeletal: No acute or destructive bony abnormalities. Reconstructed images  demonstrate no additional findings. IMPRESSION: 1. Increasing size of the thick walled cavitary lesion within the left upper lobe, consistent with progressive cavitary pneumonia. While the soft tissue nodule within the inferior aspect of the lesion is no longer visualized, there has been marked progression in the mural thickening seen along the inferior extent, consistent with progressive infection. There is communication between this cavitary lesion and a subsegmental left upper lobe bronchus as described above. 2. Worsening ground-glass airspace disease within the lingula and left lower lobe, compatible with worsening infection. 3. Trace bilateral pleural effusions, left greater than right. 4. Aortic Atherosclerosis (ICD10-I70.0) and Emphysema (ICD10-J43.9). Electronically Signed   By: Ozell Daring M.D.   On: 04/17/2024 15:39   DG Abd Portable 1V Result Date: 04/17/2024 CLINICAL DATA:  Nausea, hypoglycemia EXAM: PORTABLE ABDOMEN - 1 VIEW COMPARISON:  Overlapping portions CT chest 02/26/2024 FINDINGS: Notable lower thoracic and lumbar spondylosis. Atherosclerosis is present, including aortoiliac atherosclerotic disease. Moderate degenerative arthropathy of both hips. Scattered gas in small and large bowel without overtly dilated bowel identified. IMPRESSION: 1. Scattered gas in small and large bowel without overtly dilated bowel identified. 2. Lower thoracic and lumbar spondylosis. 3. Moderate degenerative arthropathy of both hips. Electronically Signed   By: Ryan Salvage M.D.   On: 04/17/2024 10:41     Signature  -   Lavada Stank M.D on 04/19/2024 at 9:09 AM   -  To page go to [email or web address]

## 2024-04-19 NOTE — Anesthesia Postprocedure Evaluation (Signed)
 Anesthesia Post Note  Patient: Cody Glenn  Procedure(s) Performed: BRONCHOSCOPY, FLEXIBLE (Bilateral)     Patient location during evaluation: PACU Anesthesia Type: General Level of consciousness: awake and alert Pain management: pain level controlled Vital Signs Assessment: post-procedure vital signs reviewed and stable Respiratory status: spontaneous breathing, nonlabored ventilation and respiratory function stable Cardiovascular status: blood pressure returned to baseline and stable Postop Assessment: no apparent nausea or vomiting Anesthetic complications: no   No notable events documented.  Last Vitals:  Vitals:   04/19/24 1300 04/19/24 1310  BP: 116/79 120/86  Pulse: 86 88  Resp: (!) 25 18  Temp:    SpO2: 100% 99%    Last Pain:  Vitals:   04/19/24 1310  TempSrc:   PainSc: 0-No pain                 Justin Buechner,W. EDMOND

## 2024-04-20 DIAGNOSIS — E871 Hypo-osmolality and hyponatremia: Secondary | ICD-10-CM | POA: Diagnosis not present

## 2024-04-20 LAB — CBC WITH DIFFERENTIAL/PLATELET
Abs Immature Granulocytes: 0.03 K/uL (ref 0.00–0.07)
Basophils Absolute: 0 K/uL (ref 0.0–0.1)
Basophils Relative: 0 %
Eosinophils Absolute: 0 K/uL (ref 0.0–0.5)
Eosinophils Relative: 0 %
HCT: 26.8 % — ABNORMAL LOW (ref 39.0–52.0)
Hemoglobin: 9 g/dL — ABNORMAL LOW (ref 13.0–17.0)
Immature Granulocytes: 0 %
Lymphocytes Relative: 13 %
Lymphs Abs: 1 K/uL (ref 0.7–4.0)
MCH: 33.2 pg (ref 26.0–34.0)
MCHC: 33.6 g/dL (ref 30.0–36.0)
MCV: 98.9 fL (ref 80.0–100.0)
Monocytes Absolute: 0.5 K/uL (ref 0.1–1.0)
Monocytes Relative: 6 %
Neutro Abs: 6 K/uL (ref 1.7–7.7)
Neutrophils Relative %: 81 %
Platelets: 227 K/uL (ref 150–400)
RBC: 2.71 MIL/uL — ABNORMAL LOW (ref 4.22–5.81)
RDW: 13.5 % (ref 11.5–15.5)
WBC: 7.5 K/uL (ref 4.0–10.5)
nRBC: 0 % (ref 0.0–0.2)

## 2024-04-20 LAB — AMMONIA: Ammonia: 18 umol/L (ref 9–35)

## 2024-04-20 LAB — COMPREHENSIVE METABOLIC PANEL WITH GFR
ALT: 14 U/L (ref 0–44)
AST: 22 U/L (ref 15–41)
Albumin: 1.8 g/dL — ABNORMAL LOW (ref 3.5–5.0)
Alkaline Phosphatase: 61 U/L (ref 38–126)
Anion gap: 10 (ref 5–15)
BUN: 8 mg/dL (ref 8–23)
CO2: 18 mmol/L — ABNORMAL LOW (ref 22–32)
Calcium: 8.1 mg/dL — ABNORMAL LOW (ref 8.9–10.3)
Chloride: 102 mmol/L (ref 98–111)
Creatinine, Ser: 0.55 mg/dL — ABNORMAL LOW (ref 0.61–1.24)
GFR, Estimated: >60 mL/min (ref >60)
Glucose, Bld: 145 mg/dL — ABNORMAL HIGH (ref 70–99)
Potassium: 4.6 mmol/L (ref 3.5–5.1)
Sodium: 130 mmol/L — ABNORMAL LOW (ref 135–145)
Total Bilirubin: 0.4 mg/dL (ref 0.0–1.2)
Total Protein: 5.6 g/dL — ABNORMAL LOW (ref 6.5–8.1)

## 2024-04-20 LAB — GLUCOSE, CAPILLARY
Glucose-Capillary: 141 mg/dL — ABNORMAL HIGH (ref 70–99)
Glucose-Capillary: 114 mg/dL — ABNORMAL HIGH (ref 70–99)
Glucose-Capillary: 110 mg/dL — ABNORMAL HIGH (ref 70–99)
Glucose-Capillary: 156 mg/dL — ABNORMAL HIGH (ref 70–99)

## 2024-04-20 LAB — PHOSPHORUS: Phosphorus: 3.5 mg/dL (ref 2.5–4.6)

## 2024-04-20 LAB — PROCALCITONIN: Procalcitonin: 0.73 ng/mL

## 2024-04-20 LAB — MAGNESIUM: Magnesium: 1.9 mg/dL (ref 1.7–2.4)

## 2024-04-20 LAB — C-REACTIVE PROTEIN: CRP: 8.7 mg/dL — ABNORMAL HIGH (ref <1.0)

## 2024-04-20 NOTE — Progress Notes (Signed)
 Brief PCCM progress note  PCCM will continue to follow chart peripherally for bronchoscopy results, please call if assistance is needed.  Nehemiah Mcfarren D. Harris, NP-C [facility] Personal contact information can be found on Amion  If no contact or response made please call [phone] 04/20/2024, 8:00 AM

## 2024-04-20 NOTE — Plan of Care (Signed)
 Patient refused subcu heparin .  Acknowledged.  Vennie Waymire, MD [facility] 04/20/2024, 1:33 AM

## 2024-04-20 NOTE — [place] FL2 (Signed)
 [place]  MEDICAID FL2 LEVEL OF CARE FORM     IDENTIFICATION  Patient Name: Cody Glenn Birthdate: 1956-02-07 Sex: male Admission Date (Current Location): 04/15/2024  [facility] and [place] Number:  Producer, television/film/video and Address:  [facility], [address] [place], [place] [address]      Provider Number: [id number]  Attending Physician Name and Address:  Dennise Lavada POUR, MD  Relative Name and Phone Number:       Current Level of Care: Hospital Recommended Level of Care: Skilled Nursing Facility Prior Approval Number:    Date Approved/Denied:   PASRR Number: [id number]  Discharge Plan: SNF    Current Diagnoses: Patient Active Problem List   Diagnosis Date Noted   Protein-calorie malnutrition, severe 04/17/2024   Hypocalcemia 04/16/2024   Hypomagnesemia 04/16/2024   Hyponatremia 02/20/2024   Normocytic anemia 02/20/2024   Mild protein malnutrition (HCC) 02/20/2024   Acute respiratory failure with hypoxia (HCC) 02/20/2024   Emphysema/COPD (HCC) 02/20/2024   Cavitary lesion of lung 02/20/2024   Ascending aortic aneurysm (HCC) 02/20/2024   Coronary artery calcification 02/20/2024   Aortic atherosclerosis (HCC) 02/20/2024   Closed fracture of eight ribs of right side 02/20/2024   DVT of lower extremity, bilateral (HCC) 02/20/2024   Osteoarthritis 01/21/2024   Prediabetes 01/21/2024   Problem related to housing and economic circumstances, unspecified 01/21/2024   Other B-complex deficiencies 10/13/2020   Sheltered homelessness 10/13/2020   Primary hypertension 10/13/2020   Cervical stenosis of spine 08/24/2020   Alcohol use 06/29/2020   Tobacco use 06/29/2020   Peptic ulcer without hemorrhage, perforation, or obstruction 08/09/1987    Orientation RESPIRATION BLADDER Height & Weight     Self, Situation, Place  Normal Incontinent, External catheter Weight: 149 lb 14.6 oz (68 kg) Height:  5' 5 (165.1 cm)  BEHAVIORAL  SYMPTOMS/MOOD NEUROLOGICAL BOWEL NUTRITION STATUS      Incontinent Diet (See discharge summary)  AMBULATORY STATUS COMMUNICATION OF NEEDS Skin   Extensive Assist Verbally Normal                       Personal Care Assistance Level of Assistance  Bathing, Feeding, Dressing Bathing Assistance: Maximum assistance Feeding assistance: Limited assistance Dressing Assistance: Maximum assistance     Functional Limitations Info  Sight, Hearing, Speech Sight Info: Adequate Hearing Info: Adequate Speech Info: Adequate    SPECIAL CARE FACTORS FREQUENCY  PT (By licensed PT), OT (By licensed OT)     PT Frequency: 5x week OT Frequency: 5x week            Contractures Contractures Info: Not present    Additional Factors Info  Allergies, Insulin  Sliding Scale, Isolation Precautions Code Status Info: Full Allergies Info: Lactose Intolerance (gi)   Insulin  Sliding Scale Info: See discharge summary Isolation Precautions Info: Enteric precautions     Current Medications (04/20/2024):  This is the current hospital active medication list Current Facility-Administered Medications  Medication Dose Route Frequency Provider Last Rate Last Admin   acetaminophen  (TYLENOL ) tablet 650 mg  650 mg Oral Q6H PRN Foust, Katy L, NP   650 mg at 04/18/24 0032   Or   acetaminophen  (TYLENOL ) suppository 650 mg  650 mg Rectal Q6H PRN Foust, Katy L, NP       albuterol  (PROVENTIL ) (2.5 MG/3ML) 0.083% nebulizer solution 2.5 mg  2.5 mg Nebulization Q4H PRN Foust, Katy L, NP       Ampicillin -Sulbactam (UNASYN ) 3 g in sodium chloride   0.9 % 100 mL IVPB  3 g Intravenous Q6H Hammons, Kimberly B, RPH 200 mL/hr at 04/20/24 0550 3 g at 04/20/24 0550   azithromycin  (ZITHROMAX ) tablet 500 mg  500 mg Oral Daily Singh, Prashant K, MD   500 mg at 04/20/24 0856   dextrose  50 % solution 50 mL  1 ampule Intravenous PRN Sundil, Subrina, MD       feeding supplement (BOOST / RESOURCE BREEZE) liquid 1 Container  1 Container  Oral TID BM Singh, Prashant K, MD   1 Container at 04/19/24 2117   folic acid  (FOLVITE ) tablet 1 mg  1 mg Oral Daily Agbata, Tochukwu, MD   1 mg at 04/20/24 0856   heparin  injection 5,000 Units  5,000 Units Subcutaneous Q8H Singh, Prashant K, MD   5,000 Units at 04/20/24 0549   insulin  aspart (novoLOG ) injection 0-15 Units  0-15 Units Subcutaneous TID WC Singh, Prashant K, MD   2 Units at 04/20/24 9145   insulin  aspart (novoLOG ) injection 0-5 Units  0-5 Units Subcutaneous QHS Singh, Prashant K, MD       ketorolac  (TORADOL ) 15 MG/ML injection 15 mg  15 mg Intravenous Q6H PRN Foust, Katy L, NP   15 mg at 04/17/24 0042   lidocaine  (LIDODERM ) 5 % 1 patch  1 patch Transdermal Q24H Foust, Katy L, NP   1 patch at 04/18/24 0856   LORazepam  (ATIVAN ) tablet 1-4 mg  1-4 mg Oral Q1H PRN Singh, Prashant K, MD       Or   LORazepam  (ATIVAN ) injection 1-4 mg  1-4 mg Intravenous Q1H PRN Singh, Prashant K, MD       melatonin tablet 5 mg  5 mg Oral QHS PRN Foust, Katy L, NP   5 mg at 04/17/24 0042   multivitamin with minerals tablet 1 tablet  1 tablet Oral Daily Agbata, Tochukwu, MD   1 tablet at 04/20/24 0856   nicotine  (NICODERM CQ  - dosed in mg/24 hours) patch 14 mg  14 mg Transdermal Daily Foust, Katy L, NP   14 mg at 04/20/24 0900   nicotine  polacrilex (NICORETTE ) gum 2 mg  2 mg Oral PRN Foust, Katy L, NP       ondansetron  (ZOFRAN ) tablet 4 mg  4 mg Oral Q6H PRN Foust, Katy L, NP       Or   ondansetron  (ZOFRAN ) injection 4 mg  4 mg Intravenous Q6H PRN Foust, Katy L, NP       pantoprazole  (PROTONIX ) EC tablet 40 mg  40 mg Oral Daily Foust, Katy L, NP   40 mg at 04/20/24 0856   polyethylene glycol (MIRALAX  / GLYCOLAX ) packet 17 g  17 g Oral Daily PRN Foust, Katy L, NP       sodium chloride  tablet 1 g  1 g Oral TID WC Patel, Pranav M, MD   1 g at 04/20/24 0855   thiamine  (VITAMIN B1) tablet 100 mg  100 mg Oral Daily Agbata, Tochukwu, MD   100 mg at 04/20/24 9143   Or   thiamine  (VITAMIN B1) injection 100 mg   100 mg Intravenous Daily Agbata, Tochukwu, MD   100 mg at 04/19/24 1448     Discharge Medications: Please see discharge summary for a list of discharge medications.  Relevant Imaging Results:  Relevant Lab Results:   Additional Information SSN [id number]  Janeice Stegall C Glendy Barsanti, LCSWA

## 2024-04-20 NOTE — TOC Initial Note (Addendum)
 Transition of Care [facility]) - Initial/Assessment Note    Patient Details  Name: Cody Glenn MRN: [id number] Date of Birth: 1955-09-24  Transition of Care [facility]) CM/SW Contact:    Luise JAYSON Pan, LCSWA Phone Number: 04/20/2024, 9:19 AM  Clinical Narrative:  CSW introduced self/role to patient at bedside. Patient reports he is homeless currently and has been staying at [facility] [employer] [facility]. 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 Patient stated he utilizes the bus for transportation. Patient states he has  a PCP and uses [facility] but would like to switch his pharmacy to [facility] on [address].   CSW discussed SNF recommendation from PT. Patient is agreeable to SNF at this time but stated only within [place].   ICM (inpatient care management) will continue to follow.   Expected Discharge Plan: Skilled Nursing Facility Barriers to Discharge: Continued Medical Work up, SNF Pending bed offer, Insurance Authorization   Patient Goals and CMS Choice Patient states their goals for this hospitalization and ongoing recovery are:: To go to rehab          Expected Discharge Plan and Services In-house Referral: Clinical Social Work     Living arrangements for the past 2 months: Homeless                                      Prior Living Arrangements/Services Living arrangements for the past 2 months: Homeless Lives with:: Self Patient language and need for interpreter reviewed:: Yes Do you feel safe going back to the place where you live?:  (Unable to assess)      Need for Family Participation in Patient Care: No (Comment) Care giver support system in place?: No (comment) Current home services: DME Landscape architect) Criminal Activity/Legal Involvement Pertinent to Current Situation/Hospitalization: No - Comment as needed  Activities of Daily Living   ADL Screening (condition at time of admission) Independently  performs ADLs?: No Does the patient have a NEW difficulty with bathing/dressing/toileting/self-feeding that is expected to last >3 days?: Yes (Initiates electronic notice to provider for possible OT consult) Does the patient have a NEW difficulty with getting in/out of bed, walking, or climbing stairs that is expected to last >3 days?: Yes (Initiates electronic notice to provider for possible PT consult) Does the patient have a NEW difficulty with communication that is expected to last >3 days?: Yes (Initiates electronic notice to provider for possible SLP consult) Is the patient deaf or have difficulty hearing?: No Does the patient have difficulty seeing, even when wearing glasses/contacts?: No Does the patient have difficulty concentrating, remembering, or making decisions?: Yes  Permission Sought/Granted Permission sought to share information with : Facility Industrial/product designer granted to share information with : Yes, Verbal Permission Granted     Permission granted to share info w AGENCY: SNFs        Emotional Assessment Appearance:: Appears stated age Attitude/Demeanor/Rapport: Engaged Affect (typically observed): Pleasant Orientation: : Oriented to Self, Oriented to Place, Oriented to Situation Alcohol / Substance Use: Not Applicable Psych Involvement: No (comment)  Admission diagnosis:  Alcohol abuse [F10.10] Hyponatremia [E87.1] Patient Active Problem List   Diagnosis Date Noted   Protein-calorie malnutrition, severe 04/17/2024   Hypocalcemia 04/16/2024   Hypomagnesemia 04/16/2024   Hyponatremia 02/20/2024   Normocytic anemia 02/20/2024   Mild protein malnutrition (HCC) 02/20/2024   Acute respiratory failure with hypoxia (  HCC) 02/20/2024   Emphysema/COPD (HCC) 02/20/2024   Cavitary lesion of lung 02/20/2024   Ascending aortic aneurysm (HCC) 02/20/2024   Coronary artery calcification 02/20/2024   Aortic atherosclerosis (HCC) 02/20/2024   Closed fracture of  eight ribs of right side 02/20/2024   DVT of lower extremity, bilateral (HCC) 02/20/2024   Osteoarthritis 01/21/2024   Prediabetes 01/21/2024   Problem related to housing and economic circumstances, unspecified 01/21/2024   Other B-complex deficiencies 10/13/2020   Sheltered homelessness 10/13/2020   Primary hypertension 10/13/2020   Cervical stenosis of spine 08/24/2020   Alcohol use 06/29/2020   Tobacco use 06/29/2020   Peptic ulcer without hemorrhage, perforation, or obstruction 08/09/1987   PCP:  [name], MD Pharmacy:   [facility] [address] [place] [address] Phone: [phone] Fax: [phone]     Social Drivers of Health (SDOH) Social History: SDOH Screenings   Food Insecurity: Food Insecurity Present (04/17/2024)  Housing: High Risk (04/17/2024)  Transportation Needs: Unmet Transportation Needs (04/17/2024)  Utilities: Not At Risk (04/17/2024)  Depression (PHQ2-9): Low Risk  (10/13/2020)  Social Connections: Moderately Isolated (04/17/2024)  Tobacco Use: High Risk (04/15/2024)   SDOH Interventions:     Readmission Risk Interventions    02/22/2024    2:07 PM  Readmission Risk Prevention Plan  Post Dischage Appt Complete  Medication Screening Complete  Transportation Screening Complete

## 2024-04-20 NOTE — Plan of Care (Signed)
  Problem: Education: Goal: Knowledge of General Education information will improve Description: Including pain rating scale, medication(s)/side effects and non-pharmacologic comfort measures Outcome: Progressing   Problem: Clinical Measurements: Goal: Cardiovascular complication will be avoided Outcome: Progressing   Problem: Coping: Goal: Level of anxiety will decrease Outcome: Progressing   Problem: Pain Managment: Goal: General experience of comfort will improve and/or be controlled Outcome: Progressing

## 2024-04-20 NOTE — Progress Notes (Signed)
 PROGRESS NOTE                                                                                                                                                                                                             Patient Demographics:    Cody Glenn, is a 68 y.o. male, DOB - 04-04-1956, FMW:[id number]  Outpatient Primary MD for the patient is [name], MD    LOS - 4  Admit date - 04/15/2024    Chief Complaint  Patient presents with   Hypoglycemia   Alcohol Intoxication       Brief Narrative (HPI from H&P)    68 y.o. year old male with past medical history of heavy alcohol use, DVT, PUD, history of partial gastrectomy, active smoking, prediabetes, primary hypertension, cervical spinal stenosis, and homelessness.  He presents to [name], ED after being found by a bystander in a parking lot.  On EMS arrival patient was hypoglycemic and was given an amp of D50.  In the ER workup consistent with hyponatremia, dehydration with multiple electrolyte abnormalities.  Chest x-ray also showed left upper lobe cavitary lesion.   Subjective:   Patient in bed, appears comfortable, denies any headache, no fever, no chest pain or pressure, mild cough, improved shortness of breath , no abdominal pain, improving diarrhea. No new focal weakness.    Assessment  & Plan :    Severe dehydration, hyponatremia, hypomagnesemia, hypocalcemia, hypophosphatemia.  In a patient with ongoing alcohol abuse, homeless. Nephrology on board being gently hydrated, sodium gradually improving, electrolytes have been replaced and will be monitored.   Left lung cavitary lesion with high fever.  Septic.  Homeless, alcoholic, high risk for chronic aspiration, sepsis workup initiated, IV fluids, pancultured, CT chest confirms left-sided cavitary lesion questionable abscess, he has had pulmonary evaluation for the same few weeks ago and had received  antibiotics at that time, currently on Unasyn ,.  Speech and pulmonary consulted.    He underwent bronchoscopy with biopsies on 04/19/2024, follow results.  Cultures thus far negative.  Clinically improving.  # Shigella gastroenteritis with diarrhea. 10 days of azithromycin .  ##Protein Calorie Malnutrition  Dietitian consulted.   #Alcohol use disorder Patient reports drinking 6-8 beers daily, reports last drink was yesterday - Patient counseled on importance of cessation - CIWA protocol with PRN symptom triggered Ativan    #  Tobacco Use Disorder - Patient counseled on importance of cessation - Nicotine  replacement therapy with nicotine  patch and gum while inpatient  #Normocytic Anemia Stable   #COPD Not on any outpatient maintenance inhalers -PRN Albuterol    #Hypertension - Patient reports he is not taking Diovan  - BP normotensive, monitor   #Peptic Ulcer Disease - Protonix    #Chronic Neck Pain - Lidocaine  patch - PRN toradol   #Normocytic Anemia Stable   #COPD Not on any outpatient maintenance inhalers -PRN Albuterol    #Hypertension - Patient reports he is not taking Diovan  - BP normotensive, monitor   #Peptic Ulcer Disease - Protonix    #Chronic Neck Pain - Lidocaine  patch - PRN toradol   ## Mildly elevated free T4 levels with stable TSH.  Repeat TSH and free T4 in 2 to 3 weeks by PCP   DM type II with hypoglycemia upon admission - Likely due to poor oral intake, A1c was low, monitor on oral diet, ISS.    Lab Results  Component Value Date   HGBA1C 4.1 (L) 04/16/2024   CBG (last 3)  Recent Labs    04/19/24 1826 04/19/24 2125 04/20/24 0815  GLUCAP 319* 187* 141*        Condition - Extremely Guarded  Family Communication  : None present  Code Status : Full code  Consults  : Pulmonary, nephrology  PUD Prophylaxis :  PPI   Procedures  :     CT chest with IV contrast ordered 04/17/2024 - 1. Increasing size of the thick walled cavitary lesion  within the left upper lobe, consistent with progressive cavitary pneumonia. While the soft tissue nodule within the inferior aspect of the lesion is no longer visualized, there has been marked progression in the mural thickening seen along the inferior extent, consistent with progressive infection. There is communication between this cavitary lesion and a subsegmental left upper lobe bronchus as described above. 2. Worsening ground-glass airspace disease within the lingula and left lower lobe, compatible with worsening infection. 3. Trace bilateral pleural effusions, left greater than right. 4. Aortic Atherosclerosis (ICD10-I70.0) and Emphysema (ICD10-J43.9).       Disposition Plan  :    Status is: Inpatient  DVT Prophylaxis  :    heparin  injection 5,000 Units Start: 04/17/24 1400 Place and maintain sequential compression device Start: 04/16/24 1157    Lab Results  Component Value Date   PLT 227 04/20/2024    Diet :  Diet Order             DIET DYS 2 Room service appropriate? No; Fluid consistency: Thin; Fluid restriction: 1200 mL Fluid  Diet effective now                    Inpatient Medications  Scheduled Meds:  azithromycin   500 mg Oral Daily   feeding supplement  1 Container Oral TID BM   folic acid   1 mg Oral Daily   heparin  injection (subcutaneous)  5,000 Units Subcutaneous Q8H   insulin  aspart  0-15 Units Subcutaneous TID WC   insulin  aspart  0-5 Units Subcutaneous QHS   lidocaine   1 patch Transdermal Q24H   multivitamin with minerals  1 tablet Oral Daily   nicotine   14 mg Transdermal Daily   pantoprazole   40 mg Oral Daily   sodium chloride   1 g Oral TID WC   thiamine   100 mg Oral Daily   Or   thiamine   100 mg Intravenous Daily   Continuous Infusions:  ampicillin -sulbactam (UNASYN ) IV  3 g (04/20/24 0550)   lactated ringers  75 mL/hr at 04/20/24 0118   PRN Meds:.acetaminophen  **OR** acetaminophen , albuterol , dextrose , ketorolac , LORazepam  **OR** LORazepam ,  melatonin, nicotine  polacrilex, ondansetron  **OR** ondansetron  (ZOFRAN ) IV, polyethylene glycol  Antibiotics  :    Anti-infectives (From admission, onward)    Start     Dose/Rate Route Frequency Ordered Stop   04/18/24 1100  azithromycin  (ZITHROMAX ) tablet 500 mg        500 mg Oral Daily 04/18/24 1002 04/28/24 0959   04/17/24 1200  Ampicillin -Sulbactam (UNASYN ) 3 g in sodium chloride  0.9 % 100 mL IVPB        3 g 200 mL/hr over 30 Minutes Intravenous Every 6 hours 04/17/24 1025           Objective:   Vitals:   04/20/24 0200 04/20/24 0300 04/20/24 0400 04/20/24 0700  BP: 120/74 110/73 (!) 106/90   Pulse: 73 63 62   Resp: 17 17 (!) 24   Temp:    98.2 F (36.8 C)  TempSrc:    Oral  SpO2: 100% 100% 100%   Weight:      Height:        Wt Readings from Last 3 Encounters:  04/19/24 68 kg  03/18/24 69 kg  02/20/24 69 kg     Intake/Output Summary (Last 24 hours) at 04/20/2024 0849 Last data filed at 04/20/2024 0118 Gross per 24 hour  Intake 2075.41 ml  Output --  Net 2075.41 ml     Physical Exam  Awake Alert, No new F.N deficits, Normal affect [place].AT,PERRAL Supple Neck, No JVD,   Symmetrical Chest wall movement, coarse bilateral breath sounds RRR,No Gallops,Rubs or new Murmurs,  +ve B.Sounds, Abd Soft, No tenderness,   No Cyanosis, Clubbing or edema        Data Review:    Recent Labs  Lab 04/15/24 2045 04/16/24 0743 04/16/24 2328 04/17/24 0807 04/18/24 0253 04/19/24 0340 04/20/24 0400  WBC 15.2*   < > 10.2 12.6* 15.2* 11.4* 7.5  HGB 12.1*   < > 9.5* 10.5* 8.4* 7.9* 9.0*  HCT 33.9*   < > 25.9* 29.4* 23.2* 22.7* 26.8*  PLT 277   < > 261 288 202 219 227  MCV 93.4   < > 92.5 94.2 95.1 97.0 98.9  MCH 33.3   < > 33.9 33.7 34.4* 33.8 33.2  MCHC 35.7   < > 36.7* 35.7 36.2* 34.8 33.6  RDW 12.6   < > 12.7 12.9 13.2 13.7 13.5  LYMPHSABS 1.4  --   --  0.8 1.3 1.6 1.0  MONOABS 0.9  --   --  0.6 1.2* 0.9 0.5  EOSABS 0.0  --   --  0.0 0.0 0.1 0.0  BASOSABS 0.0  --    --  0.0 0.1 0.1 0.0   < > = values in this interval not displayed.    Recent Labs  Lab 04/16/24 0743 04/16/24 1101 04/16/24 1610 04/16/24 2328 04/17/24 0807 04/17/24 1059 04/17/24 1442 04/17/24 2013 04/18/24 0253 04/19/24 0340 04/20/24 0400  NA 115* 118*   < > 121* 124*  --  121* 126* 130* 130* 130*  K 3.7  --    < > 3.5 3.7  --  3.1* 3.5 3.3* 3.7 4.6  CL 82*  --    < > 91* 90*  --  94* 96* 100 105 102  CO2 20*  --    < > 21* 23  --  18* 19* 22 20* 18*  ANIONGAP 13  --    < > 9 11  --  9 11 8 5 10   GLUCOSE 88  --    < > 104* 86  --  216* 97 101* 120* 145*  BUN 7*  --    < > 8 8  --  7* 7* 5* 5* 8  CREATININE 0.44*  --    < > 0.59* 0.59*  --  0.75 0.70 0.56* 0.50* 0.55*  AST 35  --   --  35 32  --   --   --  22 21 22   ALT 16  --   --  15 17  --   --   --  13 14 14   ALKPHOS 66  --   --  67 71  --   --   --  58 54 61  BILITOT 1.0  --   --  0.9 1.2  --   --   --  0.8 0.3 0.4  ALBUMIN 2.0*  --   --  1.8* 2.2*  --   --   --  1.7* 1.6* 1.8*  CRP  --   --   --   --   --   --   --   --  12.6* 13.2* 8.7*  PROCALCITON  --   --   --   --   --   --   --   --  6.72 2.11 0.73  INR  --   --   --   --  1.0  --   --   --   --   --   --   TSH 0.880  --   --   --   --  0.639  --   --   --   --   --   HGBA1C  --  4.1*  --   --   --   --   --   --   --   --   --   AMMONIA  --   --   --   --  <13  --   --   --  27 31 18   MG 3.2*  --   --  1.5*  --   --  2.0  --  1.8 1.5* 1.9  PHOS 2.5  --   --   --  2.1*  --   --   --  2.9 2.0* 3.5  CALCIUM  8.0*  --    < > 7.8* 8.2*  --  7.4* 7.4* 7.7* 7.6* 8.1*   < > = values in this interval not displayed.      Recent Labs  Lab 04/16/24 0743 04/16/24 1101 04/16/24 1610 04/16/24 2328 04/17/24 0807 04/17/24 1059 04/17/24 1442 04/17/24 2013 04/18/24 0253 04/19/24 0340 04/20/24 0400  CRP  --   --   --   --   --   --   --   --  12.6* 13.2* 8.7*  PROCALCITON  --   --   --   --   --   --   --   --  6.72 2.11 0.73  INR  --   --   --   --  1.0  --   --    --   --   --   --   TSH 0.880  --   --   --   --  0.639  --   --   --   --   --  HGBA1C  --  4.1*  --   --   --   --   --   --   --   --   --   AMMONIA  --   --   --   --  <13  --   --   --  27 31 18   MG 3.2*  --   --  1.5*  --   --  2.0  --  1.8 1.5* 1.9  CALCIUM  8.0*  --    < > 7.8* 8.2*  --  7.4* 7.4* 7.7* 7.6* 8.1*   < > = values in this interval not displayed.    Lab Results  Component Value Date   CHOL 213 (H) 10/13/2020   HDL 106 10/13/2020   LDLCALC 93 10/13/2020   TRIG 84 10/13/2020   CHOLHDL 2.0 10/13/2020    Lab Results  Component Value Date   HGBA1C 4.1 (L) 04/16/2024   Recent Labs    04/17/24 1059 04/17/24 1442  TSH 0.639  --   FREET4  --  1.20*   No results for input(s): VITAMINB12, FOLATE, FERRITIN, TIBC, IRON, RETICCTPCT in the last 72 hours. ------------------------------------------------------------------------------------------------------------------ Cardiac Enzymes No results for input(s): CKMB, TROPONINI, MYOGLOBIN in the last 168 hours.  Invalid input(s): CK  Micro Results Recent Results (from the past 240 hours)  Respiratory (~20 pathogens) panel by PCR     Status: None   Collection Time: 04/17/24 10:02 AM   Specimen: Nasopharyngeal Swab; Respiratory  Result Value Ref Range Status   Adenovirus NOT DETECTED NOT DETECTED Final   Coronavirus 229E NOT DETECTED NOT DETECTED Final    Comment: (NOTE) The Coronavirus on the Respiratory Panel, DOES NOT test for the novel  Coronavirus (2019 nCoV)    Coronavirus HKU1 NOT DETECTED NOT DETECTED Final   Coronavirus NL63 NOT DETECTED NOT DETECTED Final   Coronavirus OC43 NOT DETECTED NOT DETECTED Final   Metapneumovirus NOT DETECTED NOT DETECTED Final   Rhinovirus / Enterovirus NOT DETECTED NOT DETECTED Final   Influenza A NOT DETECTED NOT DETECTED Final   Influenza B NOT DETECTED NOT DETECTED Final   Parainfluenza Virus 1 NOT DETECTED NOT DETECTED Final   Parainfluenza Virus 2  NOT DETECTED NOT DETECTED Final   Parainfluenza Virus 3 NOT DETECTED NOT DETECTED Final   Parainfluenza Virus 4 NOT DETECTED NOT DETECTED Final   Respiratory Syncytial Virus NOT DETECTED NOT DETECTED Final   Bordetella pertussis NOT DETECTED NOT DETECTED Final   Bordetella Parapertussis NOT DETECTED NOT DETECTED Final   Chlamydophila pneumoniae NOT DETECTED NOT DETECTED Final   Mycoplasma pneumoniae NOT DETECTED NOT DETECTED Final    Comment: Performed at [facility], [address] [place], [place] [address]  SARS Coronavirus 2 by RT PCR (hospital order, performed in [facility] hospital lab) *cepheid single result test* Nasal Mucosa     Status: None   Collection Time: 04/17/24 10:02 AM   Specimen: Nasal Mucosa; Nasal Swab  Result Value Ref Range Status   SARS Coronavirus 2 by RT PCR NEGATIVE NEGATIVE Final    Comment: Performed at [facility], [address] [place], [place] [address]  Culture, blood (Routine X 2) w Reflex to ID Panel     Status: None (Preliminary result)   Collection Time: 04/17/24 10:55 AM   Specimen: BLOOD LEFT ARM  Result Value Ref Range Status   Specimen Description BLOOD LEFT ARM  Final   Special Requests   Final  BOTTLES DRAWN AEROBIC AND ANAEROBIC Blood Culture results may not be optimal due to an inadequate volume of blood received in culture bottles   Culture   Final    NO GROWTH 3 DAYS Performed at [facility], [address] [place], [place] [address]    Report Status PENDING  Incomplete  Culture, blood (Routine X 2) w Reflex to ID Panel     Status: None (Preliminary result)   Collection Time: 04/17/24 10:59 AM   Specimen: BLOOD  Result Value Ref Range Status   Specimen Description BLOOD SITE NOT SPECIFIED  Final   Special Requests   Final    BOTTLES DRAWN AEROBIC AND ANAEROBIC Blood Culture results may not be optimal due to an inadequate volume of blood received in culture bottles   Culture   Final    NO GROWTH 3  DAYS Performed at [facility], [address], [place], [place] [address]    Report Status PENDING  Incomplete  Gastrointestinal Panel by PCR , Stool     Status: Abnormal   Collection Time: 04/18/24 12:38 AM   Specimen: Stool  Result Value Ref Range Status   Campylobacter species NOT DETECTED NOT DETECTED Final   Plesimonas shigelloides NOT DETECTED NOT DETECTED Final   Salmonella species NOT DETECTED NOT DETECTED Final   Yersinia enterocolitica NOT DETECTED NOT DETECTED Final   Vibrio species NOT DETECTED NOT DETECTED Final   Vibrio cholerae NOT DETECTED NOT DETECTED Final   Enteroaggregative E coli (EAEC) NOT DETECTED NOT DETECTED Final   Enteropathogenic E coli (EPEC) DETECTED (A) NOT DETECTED Final    Comment: RESULT CALLED TO, READ BACK BY AND VERIFIED WITH: KEDRIC DARING RN [phone] 04/18/24 HNM    Enterotoxigenic E coli (ETEC) NOT DETECTED NOT DETECTED Final   Shiga like toxin producing E coli (STEC) NOT DETECTED NOT DETECTED Final   Shigella/Enteroinvasive E coli (EIEC) DETECTED (A) NOT DETECTED Final    Comment: RESULT CALLED TO, READ BACK BY AND VERIFIED WITH: KEDRIC DARING RN [phone] 04/18/24 HNM    Cryptosporidium NOT DETECTED NOT DETECTED Final   Cyclospora cayetanensis NOT DETECTED NOT DETECTED Final   Entamoeba histolytica NOT DETECTED NOT DETECTED Final   Giardia lamblia NOT DETECTED NOT DETECTED Final   Adenovirus F40/41 NOT DETECTED NOT DETECTED Final   Astrovirus NOT DETECTED NOT DETECTED Final   Norovirus GI/GII NOT DETECTED NOT DETECTED Final   Rotavirus A NOT DETECTED NOT DETECTED Final   Sapovirus (I, II, IV, and V) NOT DETECTED NOT DETECTED Final    Comment: Performed at [facility], [address] [place], [place] [address]  MRSA Next Gen by PCR, Nasal     Status: None   Collection Time: 04/18/24  5:45 AM   Specimen: Nasal Mucosa; Nasal Swab  Result Value Ref Range Status   MRSA by PCR Next Gen NOT DETECTED NOT DETECTED Final    Comment:  (NOTE) The GeneXpert MRSA Assay (FDA approved for NASAL specimens only), is one component of a comprehensive MRSA colonization surveillance program. It is not intended to diagnose MRSA infection nor to guide or monitor treatment for MRSA infections. Test performance is not FDA approved in patients less than 64 years old. Performed at [facility], [address], [place], [place] [address]   Culture, BAL-quantitative w Gram Stain     Status: None (Preliminary result)   Collection Time: 04/19/24 12:38 PM   Specimen: Bronchial Alveolar Lavage; Respiratory  Result Value Ref  Range Status   Specimen Description BRONCHIAL ALVEOLAR LAVAGE  Final   Special Requests NONE  Final   Gram Stain   Final    NO WBC SEEN NO ORGANISMS SEEN Performed at [facility], [address] [place], [place] [address]    Culture PENDING  Incomplete   Report Status PENDING  Incomplete    Radiology Report No results found.    Signature  -   Lavada Stank M.D on 04/20/2024 at 8:49 AM   -  To page go to [email or web address]

## 2024-04-21 DIAGNOSIS — A419 Sepsis, unspecified organism: Secondary | ICD-10-CM | POA: Diagnosis not present

## 2024-04-21 DIAGNOSIS — F1010 Alcohol abuse, uncomplicated: Secondary | ICD-10-CM | POA: Diagnosis not present

## 2024-04-21 DIAGNOSIS — E871 Hypo-osmolality and hyponatremia: Secondary | ICD-10-CM | POA: Diagnosis not present

## 2024-04-21 LAB — COMPREHENSIVE METABOLIC PANEL WITH GFR
ALT: 20 U/L (ref 0–44)
AST: 31 U/L (ref 15–41)
Albumin: 1.8 g/dL — ABNORMAL LOW (ref 3.5–5.0)
Alkaline Phosphatase: 59 U/L (ref 38–126)
Anion gap: 10 (ref 5–15)
BUN: 10 mg/dL (ref 8–23)
CO2: 22 mmol/L (ref 22–32)
Calcium: 8.1 mg/dL — ABNORMAL LOW (ref 8.9–10.3)
Chloride: 101 mmol/L (ref 98–111)
Creatinine, Ser: 0.55 mg/dL — ABNORMAL LOW (ref 0.61–1.24)
GFR, Estimated: >60 mL/min (ref >60)
Glucose, Bld: 85 mg/dL (ref 70–99)
Potassium: 4.7 mmol/L (ref 3.5–5.1)
Sodium: 133 mmol/L — ABNORMAL LOW (ref 135–145)
Total Bilirubin: 0.4 mg/dL (ref 0.0–1.2)
Total Protein: 5.3 g/dL — ABNORMAL LOW (ref 6.5–8.1)

## 2024-04-21 LAB — AMMONIA: Ammonia: <13 umol/L (ref 9–35)

## 2024-04-21 LAB — CBC WITH DIFFERENTIAL/PLATELET
Abs Immature Granulocytes: 0.05 K/uL (ref 0.00–0.07)
Basophils Absolute: 0.1 K/uL (ref 0.0–0.1)
Basophils Relative: 1 %
Eosinophils Absolute: 0.3 K/uL (ref 0.0–0.5)
Eosinophils Relative: 3 %
HCT: 26 % — ABNORMAL LOW (ref 39.0–52.0)
Hemoglobin: 8.7 g/dL — ABNORMAL LOW (ref 13.0–17.0)
Immature Granulocytes: 1 %
Lymphocytes Relative: 25 %
Lymphs Abs: 2.2 K/uL (ref 0.7–4.0)
MCH: 33.3 pg (ref 26.0–34.0)
MCHC: 33.5 g/dL (ref 30.0–36.0)
MCV: 99.6 fL (ref 80.0–100.0)
Monocytes Absolute: 0.6 K/uL (ref 0.1–1.0)
Monocytes Relative: 6 %
Neutro Abs: 5.8 K/uL (ref 1.7–7.7)
Neutrophils Relative %: 64 %
Platelets: 257 K/uL (ref 150–400)
RBC: 2.61 MIL/uL — ABNORMAL LOW (ref 4.22–5.81)
RDW: 13.6 % (ref 11.5–15.5)
WBC: 8.9 K/uL (ref 4.0–10.5)
nRBC: 0 % (ref 0.0–0.2)

## 2024-04-21 LAB — GLUCOSE, CAPILLARY
Glucose-Capillary: 80 mg/dL (ref 70–99)
Glucose-Capillary: 58 mg/dL — ABNORMAL LOW (ref 70–99)
Glucose-Capillary: 72 mg/dL (ref 70–99)
Glucose-Capillary: 114 mg/dL — ABNORMAL HIGH (ref 70–99)
Glucose-Capillary: 53 mg/dL — ABNORMAL LOW (ref 70–99)
Glucose-Capillary: 99 mg/dL (ref 70–99)
Glucose-Capillary: 103 mg/dL — ABNORMAL HIGH (ref 70–99)
Glucose-Capillary: 55 mg/dL — ABNORMAL LOW (ref 70–99)

## 2024-04-21 LAB — PHOSPHORUS: Phosphorus: 3.4 mg/dL (ref 2.5–4.6)

## 2024-04-21 LAB — ACID FAST SMEAR (AFB, MYCOBACTERIA): Acid Fast Smear: NEGATIVE

## 2024-04-21 LAB — C-REACTIVE PROTEIN: CRP: 4.6 mg/dL — ABNORMAL HIGH (ref <1.0)

## 2024-04-21 LAB — MAGNESIUM: Magnesium: 1.7 mg/dL (ref 1.7–2.4)

## 2024-04-21 LAB — PROCALCITONIN: Procalcitonin: 0.4 ng/mL

## 2024-04-21 NOTE — Plan of Care (Signed)
 Pt has rested quietly throughout the night with no distress noted. Pt is alert and oriented to person. On room air. Pt is SR on the monitor. Condom cath intact to BSD. Medicated with ativan  once with relief noted. No complaints voiced.     Problem: Clinical Measurements: Goal: Respiratory complications will improve Outcome: Progressing Goal: Cardiovascular complication will be avoided Outcome: Progressing   Problem: Activity: Goal: Risk for activity intolerance will decrease Outcome: Progressing   Problem: Nutrition: Goal: Adequate nutrition will be maintained Outcome: Progressing   Problem: Pain Managment: Goal: General experience of comfort will improve and/or be controlled Outcome: Progressing

## 2024-04-21 NOTE — Plan of Care (Signed)

## 2024-04-21 NOTE — Plan of Care (Signed)
  Problem: Education: Goal: Knowledge of General Education information will improve Description: Including pain rating scale, medication(s)/side effects and non-pharmacologic comfort measures 04/21/2024 1926 by Maralyn Siskin, RN Outcome: Progressing 04/21/2024 1925 by Maralyn Siskin, RN Outcome: Progressing   Problem: Health Behavior/Discharge Planning: Goal: Ability to manage health-related needs will improve 04/21/2024 1926 by Maralyn Siskin, RN Outcome: Progressing 04/21/2024 1925 by Maralyn Siskin, RN Outcome: Progressing   Problem: Clinical Measurements: Goal: Ability to maintain clinical measurements within normal limits will improve 04/21/2024 1926 by Maralyn Siskin, RN Outcome: Progressing 04/21/2024 1925 by Maralyn Siskin, RN Outcome: Progressing Goal: Will remain free from infection Outcome: Progressing

## 2024-04-21 NOTE — Progress Notes (Signed)
 Hypoglycemic Event  CBG: 58  Treatment: 4 oz juice/soda  Symptoms: None  Follow-up CBG: Time:1241 CBG Result:72  Possible Reasons for Event: Inadequate meal intake  Comments/MD notified: Singh, Prashant K, MD     Ellarae Nevitt

## 2024-04-21 NOTE — Progress Notes (Signed)
 PROGRESS NOTE                                                                                                                                                                                                             Patient Demographics:    Cody Glenn, is a 68 y.o. male, DOB - August 09, 1955, FMW:[id number]  Outpatient Primary MD for the patient is [name], MD    LOS - 5  Admit date - 04/15/2024    Chief Complaint  Patient presents with   Hypoglycemia   Alcohol Intoxication       Brief Narrative (HPI from H&P)    68 y.o. year old male with past medical history of heavy alcohol use, DVT, PUD, history of partial gastrectomy, active smoking, prediabetes, primary hypertension, cervical spinal stenosis, and homelessness.  He presents to [name], ED after being found by a bystander in a parking lot.  On EMS arrival patient was hypoglycemic and was given an amp of D50.  In the ER workup consistent with hyponatremia, dehydration with multiple electrolyte abnormalities.  Chest x-ray also showed left upper lobe cavitary lesion.   Subjective:   Patient in bed, appears comfortable, denies any headache, no fever, no chest pain or pressure, no shortness of breath , no abdominal pain. No new focal weakness.   Assessment  & Plan :    Severe dehydration, hyponatremia, hypomagnesemia, hypocalcemia, hypophosphatemia.  In a patient with ongoing alcohol abuse, homeless. Nephrology on board being gently hydrated, sodium gradually improving, electrolytes have been replaced and will be monitored.   Left lung cavitary lesion with high fever.  Septic.  Homeless, alcoholic, high risk for chronic aspiration, sepsis workup initiated, IV fluids, pancultured, CT chest confirms left-sided cavitary lesion questionable abscess, he has had pulmonary evaluation for the same few weeks ago and had received antibiotics at that time, currently on  Unasyn ,.  Speech and pulmonary consulted.    He underwent bronchoscopy with biopsies on 04/19/2024, follow results.  Cultures thus far negative.  Clinically improving.  # Shigella gastroenteritis with diarrhea. 10 days of azithromycin .  ##Protein Calorie Malnutrition  Dietitian consulted.   #Alcohol use disorder Patient reports drinking 6-8 beers daily, reports last drink was yesterday - Patient counseled on importance of cessation - CIWA protocol with PRN symptom triggered Ativan    #Tobacco Use Disorder -  Patient counseled on importance of cessation - Nicotine  replacement therapy with nicotine  patch and gum while inpatient  #Normocytic Anemia Stable   #COPD Not on any outpatient maintenance inhalers -PRN Albuterol    #Hypertension - Patient reports he is not taking Diovan  - BP normotensive, monitor   #Peptic Ulcer Disease - Protonix    #Chronic Neck Pain - Lidocaine  patch - PRN toradol   #Normocytic Anemia Stable   #COPD Not on any outpatient maintenance inhalers -PRN Albuterol    #Hypertension - Patient reports he is not taking Diovan  - BP normotensive, monitor   #Peptic Ulcer Disease - Protonix    #Chronic Neck Pain - Lidocaine  patch - PRN toradol   ## Mildly elevated free T4 levels with stable TSH.  Repeat TSH and free T4 in 2 to 3 weeks by PCP   DM type II with hypoglycemia upon admission - Likely due to poor oral intake, A1c was low, monitor on oral diet, ISS.    Lab Results  Component Value Date   HGBA1C 4.1 (L) 04/16/2024   CBG (last 3)  Recent Labs    04/20/24 1642 04/20/24 2104 04/21/24 0802  GLUCAP 110* 156* 80        Condition - Extremely Guarded  Family Communication  : None present  Code Status : Full code  Consults  : Pulmonary, nephrology  PUD Prophylaxis :  PPI   Procedures  :     Date:04/19/24    Provider Performing:[name]    Procedure(s):  Flexible bronchoscopy with bronchial alveolar lavage   CT chest  with IV contrast ordered 04/17/2024 - 1. Increasing size of the thick walled cavitary lesion within the left upper lobe, consistent with progressive cavitary pneumonia. While the soft tissue nodule within the inferior aspect of the lesion is no longer visualized, there has been marked progression in the mural thickening seen along the inferior extent, consistent with progressive infection. There is communication between this cavitary lesion and a subsegmental left upper lobe bronchus as described above. 2. Worsening ground-glass airspace disease within the lingula and left lower lobe, compatible with worsening infection. 3. Trace bilateral pleural effusions, left greater than right. 4. Aortic Atherosclerosis (ICD10-I70.0) and Emphysema (ICD10-J43.9).       Disposition Plan  :    Status is: Inpatient  DVT Prophylaxis  :    heparin  injection 5,000 Units Start: 04/17/24 1400 Place and maintain sequential compression device Start: 04/16/24 1157    Lab Results  Component Value Date   PLT 257 04/21/2024    Diet :  Diet Order             DIET DYS 2 Room service appropriate? No; Fluid consistency: Thin; Fluid restriction: 1200 mL Fluid  Diet effective now                    Inpatient Medications  Scheduled Meds:  azithromycin   500 mg Oral Daily   feeding supplement  1 Container Oral TID BM   folic acid   1 mg Oral Daily   heparin  injection (subcutaneous)  5,000 Units Subcutaneous Q8H   insulin  aspart  0-15 Units Subcutaneous TID WC   insulin  aspart  0-5 Units Subcutaneous QHS   lidocaine   1 patch Transdermal Q24H   multivitamin with minerals  1 tablet Oral Daily   nicotine   14 mg Transdermal Daily   pantoprazole   40 mg Oral Daily   sodium chloride   1 g Oral TID WC   thiamine   100 mg Oral Daily  Or   thiamine   100 mg Intravenous Daily   Continuous Infusions:  ampicillin -sulbactam (UNASYN ) IV 3 g (04/21/24 0536)   PRN Meds:.acetaminophen  **OR** acetaminophen , albuterol ,  dextrose , ketorolac , LORazepam  **OR** LORazepam , melatonin, nicotine  polacrilex, ondansetron  **OR** ondansetron  (ZOFRAN ) IV, polyethylene glycol  Antibiotics  :    Anti-infectives (From admission, onward)    Start     Dose/Rate Route Frequency Ordered Stop   04/18/24 1100  azithromycin  (ZITHROMAX ) tablet 500 mg        500 mg Oral Daily 04/18/24 1002 04/28/24 0959   04/17/24 1200  Ampicillin -Sulbactam (UNASYN ) 3 g in sodium chloride  0.9 % 100 mL IVPB        3 g 200 mL/hr over 30 Minutes Intravenous Every 6 hours 04/17/24 1025           Objective:   Vitals:   04/21/24 0000 04/21/24 0300 04/21/24 0500 04/21/24 0800  BP: 108/84  112/86 112/79  Pulse:    92  Resp: 19 18 18  (!) 26  Temp:   98 F (36.7 C) 97.7 F (36.5 C)  TempSrc:   Oral Oral  SpO2: 100%  99% 100%  Weight:      Height:        Wt Readings from Last 3 Encounters:  04/19/24 68 kg  03/18/24 69 kg  02/20/24 69 kg     Intake/Output Summary (Last 24 hours) at 04/21/2024 0812 Last data filed at 04/21/2024 0542 Gross per 24 hour  Intake 780 ml  Output 750 ml  Net 30 ml     Physical Exam  Awake Alert, No new F.N deficits, Normal affect [place].AT,PERRAL Supple Neck, No JVD,   Symmetrical Chest wall movement, coarse bilateral breath sounds RRR,No Gallops,Rubs or new Murmurs,  +ve B.Sounds, Abd Soft, No tenderness,   No Cyanosis, Clubbing or edema        Data Review:    Recent Labs  Lab 04/17/24 0807 04/18/24 0253 04/19/24 0340 04/20/24 0400 04/21/24 0421  WBC 12.6* 15.2* 11.4* 7.5 8.9  HGB 10.5* 8.4* 7.9* 9.0* 8.7*  HCT 29.4* 23.2* 22.7* 26.8* 26.0*  PLT 288 202 219 227 257  MCV 94.2 95.1 97.0 98.9 99.6  MCH 33.7 34.4* 33.8 33.2 33.3  MCHC 35.7 36.2* 34.8 33.6 33.5  RDW 12.9 13.2 13.7 13.5 13.6  LYMPHSABS 0.8 1.3 1.6 1.0 2.2  MONOABS 0.6 1.2* 0.9 0.5 0.6  EOSABS 0.0 0.0 0.1 0.0 0.3  BASOSABS 0.0 0.1 0.1 0.0 0.1    Recent Labs  Lab 04/16/24 0743 04/16/24 1101 04/16/24 1610 04/17/24 0807  04/17/24 1059 04/17/24 1442 04/17/24 2013 04/18/24 0253 04/19/24 0340 04/20/24 0400 04/21/24 0421  NA 115* 118*   < > 124*  --  121* 126* 130* 130* 130* 133*  K 3.7  --    < > 3.7  --  3.1* 3.5 3.3* 3.7 4.6 4.7  CL 82*  --    < > 90*  --  94* 96* 100 105 102 101  CO2 20*  --    < > 23  --  18* 19* 22 20* 18* 22  ANIONGAP 13  --    < > 11  --  9 11 8 5 10 10   GLUCOSE 88  --    < > 86  --  216* 97 101* 120* 145* 85  BUN 7*  --    < > 8  --  7* 7* 5* 5* 8 10  CREATININE 0.44*  --    < >  0.59*  --  0.75 0.70 0.56* 0.50* 0.55* 0.55*  AST 35  --    < > 32  --   --   --  22 21 22 31   ALT 16  --    < > 17  --   --   --  13 14 14 20   ALKPHOS 66  --    < > 71  --   --   --  58 54 61 59  BILITOT 1.0  --    < > 1.2  --   --   --  0.8 0.3 0.4 0.4  ALBUMIN 2.0*  --    < > 2.2*  --   --   --  1.7* 1.6* 1.8* 1.8*  CRP  --   --   --   --   --   --   --  12.6* 13.2* 8.7* 4.6*  PROCALCITON  --   --   --   --   --   --   --  6.72 2.11 0.73 0.40  INR  --   --   --  1.0  --   --   --   --   --   --   --   TSH 0.880  --   --   --  0.639  --   --   --   --   --   --   HGBA1C  --  4.1*  --   --   --   --   --   --   --   --   --   AMMONIA  --   --   --  <13  --   --   --  27 31 18  <13  MG 3.2*  --    < >  --   --  2.0  --  1.8 1.5* 1.9 1.7  PHOS 2.5  --   --  2.1*  --   --   --  2.9 2.0* 3.5 3.4  CALCIUM  8.0*  --    < > 8.2*  --  7.4* 7.4* 7.7* 7.6* 8.1* 8.1*   < > = values in this interval not displayed.      Recent Labs  Lab 04/16/24 0743 04/16/24 1101 04/16/24 1610 04/17/24 0807 04/17/24 1059 04/17/24 1442 04/17/24 2013 04/18/24 0253 04/19/24 0340 04/20/24 0400 04/21/24 0421  CRP  --   --   --   --   --   --   --  12.6* 13.2* 8.7* 4.6*  PROCALCITON  --   --   --   --   --   --   --  6.72 2.11 0.73 0.40  INR  --   --   --  1.0  --   --   --   --   --   --   --   TSH 0.880  --   --   --  0.639  --   --   --   --   --   --   HGBA1C  --  4.1*  --   --   --   --   --   --   --   --   --    AMMONIA  --   --   --  <13  --   --   --  27 31 18  <13  MG 3.2*  --    < >  --   --  2.0  --  1.8 1.5* 1.9 1.7  CALCIUM  8.0*  --    < > 8.2*  --  7.4* 7.4* 7.7* 7.6* 8.1* 8.1*   < > = values in this interval not displayed.    Lab Results  Component Value Date   CHOL 213 (H) 10/13/2020   HDL 106 10/13/2020   LDLCALC 93 10/13/2020   TRIG 84 10/13/2020   CHOLHDL 2.0 10/13/2020    Lab Results  Component Value Date   HGBA1C 4.1 (L) 04/16/2024   No results for input(s): TSH, T4TOTAL, FREET4, T3FREE, THYROIDAB in the last 72 hours.  No results for input(s): VITAMINB12, FOLATE, FERRITIN, TIBC, IRON, RETICCTPCT in the last 72 hours. ------------------------------------------------------------------------------------------------------------------ Cardiac Enzymes No results for input(s): CKMB, TROPONINI, MYOGLOBIN in the last 168 hours.  Invalid input(s): CK  Micro Results Recent Results (from the past 240 hours)  Respiratory (~20 pathogens) panel by PCR     Status: None   Collection Time: 04/17/24 10:02 AM   Specimen: Nasopharyngeal Swab; Respiratory  Result Value Ref Range Status   Adenovirus NOT DETECTED NOT DETECTED Final   Coronavirus 229E NOT DETECTED NOT DETECTED Final    Comment: (NOTE) The Coronavirus on the Respiratory Panel, DOES NOT test for the novel  Coronavirus (2019 nCoV)    Coronavirus HKU1 NOT DETECTED NOT DETECTED Final   Coronavirus NL63 NOT DETECTED NOT DETECTED Final   Coronavirus OC43 NOT DETECTED NOT DETECTED Final   Metapneumovirus NOT DETECTED NOT DETECTED Final   Rhinovirus / Enterovirus NOT DETECTED NOT DETECTED Final   Influenza A NOT DETECTED NOT DETECTED Final   Influenza B NOT DETECTED NOT DETECTED Final   Parainfluenza Virus 1 NOT DETECTED NOT DETECTED Final   Parainfluenza Virus 2 NOT DETECTED NOT DETECTED Final   Parainfluenza Virus 3 NOT DETECTED NOT DETECTED Final   Parainfluenza Virus 4 NOT DETECTED NOT  DETECTED Final   Respiratory Syncytial Virus NOT DETECTED NOT DETECTED Final   Bordetella pertussis NOT DETECTED NOT DETECTED Final   Bordetella Parapertussis NOT DETECTED NOT DETECTED Final   Chlamydophila pneumoniae NOT DETECTED NOT DETECTED Final   Mycoplasma pneumoniae NOT DETECTED NOT DETECTED Final    Comment: Performed at [facility], [address], [place], [place] [address]  SARS Coronavirus 2 by RT PCR (hospital order, performed in [facility] hospital lab) *cepheid single result test* Nasal Mucosa     Status: None   Collection Time: 04/17/24 10:02 AM   Specimen: Nasal Mucosa; Nasal Swab  Result Value Ref Range Status   SARS Coronavirus 2 by RT PCR NEGATIVE NEGATIVE Final    Comment: Performed at [facility], [address] [place], [place] [address]  Culture, blood (Routine X 2) w Reflex to ID Panel     Status: None (Preliminary result)   Collection Time: 04/17/24 10:55 AM   Specimen: BLOOD LEFT ARM  Result Value Ref Range Status   Specimen Description BLOOD LEFT ARM  Final   Special Requests   Final    BOTTLES DRAWN AEROBIC AND ANAEROBIC Blood Culture results may not be optimal due to an inadequate volume of blood received in culture bottles   Culture   Final    NO GROWTH 3 DAYS Performed at [facility], [address] [place], [place] [address]    Report Status PENDING  Incomplete  Culture, blood (Routine X 2) w Reflex to ID Panel     Status: None (Preliminary result)   Collection Time:  04/17/24 10:59 AM   Specimen: BLOOD  Result Value Ref Range Status   Specimen Description BLOOD SITE NOT SPECIFIED  Final   Special Requests   Final    BOTTLES DRAWN AEROBIC AND ANAEROBIC Blood Culture results may not be optimal due to an inadequate volume of blood received in culture bottles   Culture   Final    NO GROWTH 3 DAYS Performed at [facility], [address], [place], [place] [address]    Report Status PENDING  Incomplete  Gastrointestinal  Panel by PCR , Stool     Status: Abnormal   Collection Time: 04/18/24 12:38 AM   Specimen: Stool  Result Value Ref Range Status   Campylobacter species NOT DETECTED NOT DETECTED Final   Plesimonas shigelloides NOT DETECTED NOT DETECTED Final   Salmonella species NOT DETECTED NOT DETECTED Final   Yersinia enterocolitica NOT DETECTED NOT DETECTED Final   Vibrio species NOT DETECTED NOT DETECTED Final   Vibrio cholerae NOT DETECTED NOT DETECTED Final   Enteroaggregative E coli (EAEC) NOT DETECTED NOT DETECTED Final   Enteropathogenic E coli (EPEC) DETECTED (A) NOT DETECTED Final    Comment: RESULT CALLED TO, READ BACK BY AND VERIFIED WITH: KEDRIC DARING RN [phone] 04/18/24 HNM    Enterotoxigenic E coli (ETEC) NOT DETECTED NOT DETECTED Final   Shiga like toxin producing E coli (STEC) NOT DETECTED NOT DETECTED Final   Shigella/Enteroinvasive E coli (EIEC) DETECTED (A) NOT DETECTED Final    Comment: RESULT CALLED TO, READ BACK BY AND VERIFIED WITH: KEDRIC DARING RN [phone] 04/18/24 HNM    Cryptosporidium NOT DETECTED NOT DETECTED Final   Cyclospora cayetanensis NOT DETECTED NOT DETECTED Final   Entamoeba histolytica NOT DETECTED NOT DETECTED Final   Giardia lamblia NOT DETECTED NOT DETECTED Final   Adenovirus F40/41 NOT DETECTED NOT DETECTED Final   Astrovirus NOT DETECTED NOT DETECTED Final   Norovirus GI/GII NOT DETECTED NOT DETECTED Final   Rotavirus A NOT DETECTED NOT DETECTED Final   Sapovirus (I, II, IV, and V) NOT DETECTED NOT DETECTED Final    Comment: Performed at [facility], [address] [place], [place] [address]  MRSA Next Gen by PCR, Nasal     Status: None   Collection Time: 04/18/24  5:45 AM   Specimen: Nasal Mucosa; Nasal Swab  Result Value Ref Range Status   MRSA by PCR Next Gen NOT DETECTED NOT DETECTED Final    Comment: (NOTE) The GeneXpert MRSA Assay (FDA approved for NASAL specimens only), is one component of a comprehensive MRSA colonization  surveillance program. It is not intended to diagnose MRSA infection nor to guide or monitor treatment for MRSA infections. Test performance is not FDA approved in patients less than 88 years old. Performed at [facility], [address] [place], [place] [address]   Culture, BAL-quantitative w Gram Stain     Status: None (Preliminary result)   Collection Time: 04/19/24 12:38 PM   Specimen: Bronchial Alveolar Lavage; Respiratory  Result Value Ref Range Status   Specimen Description BRONCHIAL ALVEOLAR LAVAGE  Final   Special Requests NONE  Final   Gram Stain   Final    NO WBC SEEN NO ORGANISMS SEEN Performed at [facility], [address] [place], [place] [address]    Culture PENDING  Incomplete   Report Status PENDING  Incomplete    Radiology Report No results found.    Signature  -   Lavada Dennise HERO.D  on 04/21/2024 at 8:12 AM   -  To page go to [email or web address]

## 2024-04-22 ENCOUNTER — Telehealth: Payer: Self-pay | Admitting: Pulmonary Disease

## 2024-04-22 ENCOUNTER — Encounter (HOSPITAL_COMMUNITY): Payer: Self-pay | Admitting: Pulmonary Disease

## 2024-04-22 DIAGNOSIS — A419 Sepsis, unspecified organism: Secondary | ICD-10-CM | POA: Diagnosis not present

## 2024-04-22 DIAGNOSIS — E871 Hypo-osmolality and hyponatremia: Secondary | ICD-10-CM | POA: Diagnosis not present

## 2024-04-22 DIAGNOSIS — F1010 Alcohol abuse, uncomplicated: Secondary | ICD-10-CM | POA: Diagnosis not present

## 2024-04-22 LAB — CBC WITH DIFFERENTIAL/PLATELET
Abs Immature Granulocytes: 0.04 K/uL (ref 0.00–0.07)
Basophils Absolute: 0.1 K/uL (ref 0.0–0.1)
Basophils Relative: 1 %
Eosinophils Absolute: 0.4 K/uL (ref 0.0–0.5)
Eosinophils Relative: 5 %
HCT: 25.4 % — ABNORMAL LOW (ref 39.0–52.0)
Hemoglobin: 8.5 g/dL — ABNORMAL LOW (ref 13.0–17.0)
Immature Granulocytes: 1 %
Lymphocytes Relative: 26 %
Lymphs Abs: 2.1 K/uL (ref 0.7–4.0)
MCH: 32.9 pg (ref 26.0–34.0)
MCHC: 33.5 g/dL (ref 30.0–36.0)
MCV: 98.4 fL (ref 80.0–100.0)
Monocytes Absolute: 0.8 K/uL (ref 0.1–1.0)
Monocytes Relative: 10 %
Neutro Abs: 4.8 K/uL (ref 1.7–7.7)
Neutrophils Relative %: 57 %
Platelets: 276 K/uL (ref 150–400)
RBC: 2.58 MIL/uL — ABNORMAL LOW (ref 4.22–5.81)
RDW: 13.5 % (ref 11.5–15.5)
WBC: 8.1 K/uL (ref 4.0–10.5)
nRBC: 0 % (ref 0.0–0.2)

## 2024-04-22 LAB — COMPREHENSIVE METABOLIC PANEL WITH GFR
ALT: 20 U/L (ref 0–44)
AST: 26 U/L (ref 15–41)
Albumin: 1.6 g/dL — ABNORMAL LOW (ref 3.5–5.0)
Alkaline Phosphatase: 53 U/L (ref 38–126)
Anion gap: 12 (ref 5–15)
BUN: 8 mg/dL (ref 8–23)
CO2: 19 mmol/L — ABNORMAL LOW (ref 22–32)
Calcium: 7.9 mg/dL — ABNORMAL LOW (ref 8.9–10.3)
Chloride: 103 mmol/L (ref 98–111)
Creatinine, Ser: 0.51 mg/dL — ABNORMAL LOW (ref 0.61–1.24)
GFR, Estimated: >60 mL/min (ref >60)
Glucose, Bld: 84 mg/dL (ref 70–99)
Potassium: 4.2 mmol/L (ref 3.5–5.1)
Sodium: 134 mmol/L — ABNORMAL LOW (ref 135–145)
Total Bilirubin: 0.4 mg/dL (ref 0.0–1.2)
Total Protein: 4.7 g/dL — ABNORMAL LOW (ref 6.5–8.1)

## 2024-04-22 LAB — GLUCOSE, CAPILLARY
Glucose-Capillary: 121 mg/dL — ABNORMAL HIGH (ref 70–99)
Glucose-Capillary: 133 mg/dL — ABNORMAL HIGH (ref 70–99)
Glucose-Capillary: 79 mg/dL (ref 70–99)
Glucose-Capillary: 80 mg/dL (ref 70–99)
Glucose-Capillary: 82 mg/dL (ref 70–99)
Glucose-Capillary: 98 mg/dL (ref 70–99)

## 2024-04-22 LAB — CULTURE, BLOOD (ROUTINE X 2)
Culture: NO GROWTH
Culture: NO GROWTH

## 2024-04-22 LAB — PROCALCITONIN: Procalcitonin: 0.12 ng/mL

## 2024-04-22 LAB — AMMONIA: Ammonia: 24 umol/L (ref 9–35)

## 2024-04-22 LAB — URIC ACID: Uric Acid, Serum: 3.9 mg/dL (ref 3.7–8.6)

## 2024-04-22 LAB — CYTOLOGY - NON PAP

## 2024-04-22 MED ORDER — LINEZOLID 600 MG PO TABS
600.0000 mg | ORAL_TABLET | Freq: Two times a day (BID) | ORAL | Status: DC
Start: 2024-04-22 — End: 2024-05-06
  Administered 2024-04-22 – 2024-04-25 (×7): 600 mg via ORAL
  Filled 2024-04-22 (×7): qty 1

## 2024-04-22 MED ORDER — INSULIN ASPART 100 UNIT/ML IJ SOLN
0.0000 [IU] | Freq: Three times a day (TID) | INTRAMUSCULAR | Status: DC
  Administered 2024-04-25: 2 [IU] via SUBCUTANEOUS
  Administered 2024-04-26 – 2024-04-28 (×2): 1 [IU] via SUBCUTANEOUS
  Administered 2024-04-28: 2 [IU] via SUBCUTANEOUS

## 2024-04-22 NOTE — TOC Progression Note (Signed)
 Transition of Care [facility]) - Progression Note    Patient Details  Name: Cody Glenn MRN: [id number] Date of Birth: 04-09-1956  Transition [facility] CM/SW Contact  Inocente GORMAN Kindle, LCSW Phone Number: 04/22/2024, 9:15 AM  Clinical Narrative:    9:15 AM-No SNF bed offers available at this time. CSW expanded search.    Expected Discharge Plan: Skilled Nursing Facility Barriers to Discharge: Continued Medical Work up, SNF Pending bed offer, Insurance Authorization               Expected Discharge Plan and Services In-house Referral: Clinical Social Work     Living arrangements for the past 2 months: Homeless                                       Social Drivers of Health (SDOH) Interventions SDOH Screenings   Food Insecurity: Food Insecurity Present (04/17/2024)  Housing: High Risk (04/17/2024)  Transportation Needs: Unmet Transportation Needs (04/17/2024)  Utilities: Not At Risk (04/17/2024)  Depression (PHQ2-9): Low Risk  (10/13/2020)  Social Connections: Moderately Isolated (04/17/2024)  Tobacco Use: High Risk (04/15/2024)    Readmission Risk Interventions    02/22/2024    2:07 PM  Readmission Risk Prevention Plan  Post Dischage Appt Complete  Medication Screening Complete  Transportation Screening Complete

## 2024-04-22 NOTE — Progress Notes (Signed)
 NAME:  Cody Glenn, MRN:  [id number], DOB:  May 09, 1956, LOS: 6 ADMISSION DATE:  04/15/2024, CONSULTATION DATE:  04/17/24 REFERRING MD:  Dennise, CHIEF COMPLAINT:  abnormal chest xr   History of Present Illness:  68 yo M PMH etoh abuse, dvt, DM, HTN, cavitary lung lesion who was admitted to [facility] 04/16/24 after presenting to ED for AMS. He was found to be hypoglycemic which was addressed, and have AoC hyponatremia for which nephrology was consulted.  A CXR was obtained 9/10 which reveals LUL cavitary lesion, and air concerning for possible BPF.    PCCM consulted in this setting   He has a known LUL cavitary lesion that was worked up on prior admission, and was dc in July on 3wk augmentin . He reports to me he completed this. No resp complaints. Does endorse frequent coughing when he swallows thin liquids, specifically noting that he coughs a lot when he drinks alcohol.   Pertinent  Medical History  Etoh abuse  Cavitary lung lesion Malnutrition Dvt HTN   Significant Hospital Events: Including procedures, antibiotic start and stop dates in addition to other pertinent events   9/9 admitted 9/10 pulm consult, GI panel positive for EPEC and Shigella 9/11 CT Surgery consult, no need for surgery at this time  Interim History / Subjective:    No issues Reports coughing up mucous with some blood tinge appearance to it this morning Denies fevers or chills  Objective    Blood pressure 99/65, pulse 82, temperature 97.6 F (36.4 C), temperature source Oral, resp. rate 20, height 5' 5 (1.651 m), weight 68 kg, SpO2 97%.        Intake/Output Summary (Last 24 hours) at 04/22/2024 1055 Last data filed at 04/22/2024 9157 Gross per 24 hour  Intake --  Output 1200 ml  Net -1200 ml   Filed Weights   04/15/24 2033 04/19/24 1154  Weight: 68 kg 68 kg    Examination: General: chronically ill male, no distress, laying in bed HENT: NCAT  Lungs: no wheezing or ronchi Cardiovascular:  rrr Abdomen: soft, non-tender, nondistended Extremities: decrease muscle mass  Neuro: AAOx3  GU: defer  Resolved problem list   Assessment and Plan   Persistent LUL cavitary lung lesion Aspiration Risk, High Alcohol Use Disorder Multifocal ASD  Suspected dysphagia  EPEC/Shigella gastroenteritis   P - No growth to date from BAL - ID has recommended augmentin  and linezolid  for 2 weeks for cavitary lung lesions - Agree with antibiotic regimen, would ideally like to treat indefinitely with Augmentin  until radiographic resolution but given social circumstances, best to do shorter regimen and avoid aspiration risks.  PCCM will sign off.  Will arrange follow up in clinic.  Labs   CBC: Recent Labs  Lab 04/18/24 0253 04/19/24 0340 04/20/24 0400 04/21/24 0421 04/22/24 0445  WBC 15.2* 11.4* 7.5 8.9 8.1  NEUTROABS 12.4* 8.6* 6.0 5.8 4.8  HGB 8.4* 7.9* 9.0* 8.7* 8.5*  HCT 23.2* 22.7* 26.8* 26.0* 25.4*  MCV 95.1 97.0 98.9 99.6 98.4  PLT 202 219 227 257 276    Basic Metabolic Panel: Recent Labs  Lab 04/17/24 0807 04/17/24 1442 04/17/24 2013 04/18/24 0253 04/19/24 0340 04/20/24 0400 04/21/24 0421 04/22/24 0445  NA 124* 121*   < > 130* 130* 130* 133* 134*  K 3.7 3.1*   < > 3.3* 3.7 4.6 4.7 4.2  CL 90* 94*   < > 100 105 102 101 103  CO2 23 18*   < > 22 20* 18* 22  19*  GLUCOSE 86 216*   < > 101* 120* 145* 85 84  BUN 8 7*   < > 5* 5* 8 10 8   CREATININE 0.59* 0.75   < > 0.56* 0.50* 0.55* 0.55* 0.51*  CALCIUM  8.2* 7.4*   < > 7.7* 7.6* 8.1* 8.1* 7.9*  MG  --  2.0  --  1.8 1.5* 1.9 1.7  --   PHOS 2.1*  --   --  2.9 2.0* 3.5 3.4  --    < > = values in this interval not displayed.   GFR: Estimated Creatinine Clearance: 77.9 mL/min (A) (by C-G formula based on SCr of 0.51 mg/dL (L)). Recent Labs  Lab 04/19/24 0340 04/20/24 0400 04/21/24 0421 04/22/24 0445  PROCALCITON 2.11 0.73 0.40 0.12  WBC 11.4* 7.5 8.9 8.1    Liver Function Tests: Recent Labs  Lab  04/18/24 0253 04/19/24 0340 04/20/24 0400 04/21/24 0421 04/22/24 0445  AST 22 21 22 31 26   ALT 13 14 14 20 20   ALKPHOS 58 54 61 59 53  BILITOT 0.8 0.3 0.4 0.4 0.4  PROT 4.8* 4.9* 5.6* 5.3* 4.7*  ALBUMIN 1.7* 1.6* 1.8* 1.8* 1.6*   No results for input(s): LIPASE, AMYLASE in the last 168 hours. Recent Labs  Lab 04/18/24 0253 04/19/24 0340 04/20/24 0400 04/21/24 0421 04/22/24 0445  AMMONIA 27 31 18  <13 24    ABG    Component Value Date/Time   TCO2 20 (L) 04/15/2024 2043     Coagulation Profile: Recent Labs  Lab 04/17/24 0807  INR 1.0    Cardiac Enzymes: No results for input(s): CKTOTAL, CKMB, CKMBINDEX, TROPONINI in the last 168 hours.  HbA1C: Hgb A1c MFr Bld  Date/Time Value Ref Range Status  04/16/2024 11:01 AM 4.1 (L) 4.8 - 5.6 % Final    Comment:    (NOTE) Diagnosis of Diabetes The following HbA1c ranges recommended by the American Diabetes Association (ADA) may be used as an aid in the diagnosis of diabetes mellitus.  Hemoglobin             Suggested A1C NGSP%              Diagnosis  <5.7                   Non Diabetic  5.7-6.4                Pre-Diabetic  >6.4                   Diabetic  <7.0                   Glycemic control for                       adults with diabetes.    10/13/2020 02:08 PM 5.5 4.8 - 5.6 % Final    Comment:             Prediabetes: 5.7 - 6.4          Diabetes: >6.4          Glycemic control for adults with diabetes: <7.0     CBG: Recent Labs  Lab 04/21/24 2201 04/21/24 2229 04/22/24 0006 04/22/24 0511 04/22/24 0842  GLUCAP 55* 103* 98 82 133*       Critical care time: na    Dorn Chill, MD [facility] Office: [phone]   See Amion for personal pager PCCM on  call pager [phone] until 7pm. Please call Elink 7p-7a. [phone]

## 2024-04-22 NOTE — Plan of Care (Signed)
   Problem: Education: Goal: Knowledge of General Education information will improve Description Including pain rating scale, medication(s)/side effects and non-pharmacologic comfort measures Outcome: Progressing   Problem: Health Behavior/Discharge Planning: Goal: Ability to manage health-related needs will improve Outcome: Progressing

## 2024-04-22 NOTE — Progress Notes (Signed)
 Physical Therapy Treatment Patient Details Name: Cody Glenn MRN: [id number] DOB: [birth year] Today's Date: 04/22/2024   History of Present Illness Pt is 68 year old presented to [facility] on  04/15/24 for acute on chronic hyponatremia due to alcohol use. PMH - etoh abuse, DVT, htn, cervical stenosis, partial gastrectomy    PT Comments  Pt admitted with above diagnosis. Pt incr distance today with ambulation with rollator. Needs max encouragment and needs +2 for safety as well as mod assist for stability. Continue to recommend post acute rehab < 3 hours day.  Pt currently with functional limitations due to the deficits listed below (see PT Problem List). Pt will benefit from acute skilled PT to increase their independence and safety with mobility to allow discharge.       If plan is discharge home, recommend the following: A lot of help with walking and/or transfers;A lot of help with bathing/dressing/bathroom;Assistance with cooking/housework;Supervision due to cognitive status;Assist for transportation   Can travel by private vehicle     Yes  Equipment Recommendations  None recommended by PT    Recommendations for Other Services       Precautions / Restrictions Precautions Precautions: Fall Precaution/Restrictions Comments: bowel incontinence Restrictions Weight Bearing Restrictions Per Provider Order: No     Mobility  Bed Mobility Overal bed mobility: Needs Assistance Bed Mobility: Supine to Sit, Sit to Supine     Supine to sit: Mod assist, HOB elevated, Used rails     General bed mobility comments: Placed diaper on pt prior to walk for safety as he has frequent incontinence.  Pt needed cues to grab R rail with R UE and push up on R elbow while pulling up on therapist with L UE to sit up R EOB, modA at trunk.    Transfers Overall transfer level: Needs assistance Equipment used: Rollator (4 wheels) Transfers: Sit to/from Stand Sit to Stand: Mod assist            General transfer comment: Pt needed modA to power up to stand and gain balance, transferring from EOB to rollator. Pt maintained a flexed posture and posterior lean initially, needing multi-modal cues to correct.    Ambulation/Gait Ambulation/Gait assistance: Mod assist, +2 safety/equipment Gait Distance (Feet): 15 Feet (15 feet x 2) Assistive device: Rollator (4 wheels) Gait Pattern/deviations: Step-to pattern, Decreased step length - right, Decreased step length - left, Decreased stride length, Shuffle, Trunk flexed, Leaning posteriorly Gait velocity: reduced Gait velocity interpretation: <1.31 ft/sec, indicative of household ambulator   General Gait Details: Pt ambulates with slow, small, shuffling steps and a flexed posture with intermittent posterior lean. Pt needing verbal and tactile cues to take longer steps and stand upright. Cues also needed to reduce posterior lean. Min-modA for balance to step anterior <> posterior at EOB. Limited distance by pt fatigue. Pt refuses to ambulate more than 15 feet at a time and states he has to sit down after 15 feet.  Had tech follow pt with chair and he did sit every 15 feet and refused to ambulate again after 2 bouts.   Stairs             Wheelchair Mobility     Tilt Bed    Modified Rankin (Stroke Patients Only)       Balance Overall balance assessment: Needs assistance Sitting-balance support: No upper extremity supported, Feet supported Sitting balance-Leahy Scale: Fair Sitting balance - Comments: sits statically EOB with CGA-supervision for safety Postural control: Posterior lean Standing balance  support: Bilateral upper extremity supported, During functional activity, Reliant on assistive device for balance Standing balance-Leahy Scale: Poor Standing balance comment: reliant on UE support and up to modA, intermittent posterior lean noted                            Communication Communication Communication: No  apparent difficulties  Cognition Arousal: Alert Behavior During Therapy: WFL for tasks assessed/performed   PT - Cognitive impairments: No family/caregiver present to determine baseline, Memory, Safety/Judgement, Problem solving                       PT - Cognition Comments: Pt self limiting, needing encouragement to progress mobility. Following commands: Impaired Following commands impaired: Follows one step commands with increased time    Cueing Cueing Techniques: Tactile cues, Verbal cues  Exercises General Exercises - Lower Extremity Ankle Circles/Pumps: AROM, Both, 10 reps, Supine Long Arc Quad: AROM, Both, 10 reps, Seated    General Comments General comments (skin integrity, edema, etc.): VSS      Pertinent Vitals/Pain Pain Assessment Pain Assessment: Faces Faces Pain Scale: Hurts little more Pain Location: generalized Pain Descriptors / Indicators: Discomfort, Grimacing, Guarding Pain Intervention(s): Limited activity within patient's tolerance, Monitored during session, Repositioned    Home Living                          Prior Function            PT Goals (current goals can now be found in the care plan section) Progress towards PT goals: Progressing toward goals    Frequency    Min 2X/week      PT Plan      Co-evaluation              AM-PAC PT 6 Clicks Mobility   Outcome Measure  Help needed turning from your back to your side while in a flat bed without using bedrails?: A Little Help needed moving from lying on your back to sitting on the side of a flat bed without using bedrails?: A Lot Help needed moving to and from a bed to a chair (including a wheelchair)?: A Lot Help needed standing up from a chair using your arms (e.g., wheelchair or bedside chair)?: A Lot Help needed to walk in hospital room?: Total (<20 ft) Help needed climbing 3-5 steps with a railing? : Total 6 Click Score: 11    End of Session Equipment  Utilized During Treatment: Gait belt Activity Tolerance: Patient limited by fatigue Patient left: with call bell/phone within reach;in chair;with chair alarm set Nurse Communication: Mobility status;Patient requests pain meds (Check IV and pt requesting tylenol ) PT Visit Diagnosis: Unsteadiness on feet (R26.81);Other abnormalities of gait and mobility (R26.89);Muscle weakness (generalized) (M62.81);Difficulty in walking, not elsewhere classified (R26.2)     Time: 1107-1130 PT Time Calculation (min) (ACUTE ONLY): 23 min  Charges:    $Gait Training: 8-22 mins $Therapeutic Exercise: 8-22 mins PT General Charges $$ ACUTE PT VISIT: 1 Visit                     Rhiana Morash M,PT Acute Rehab Services [phone]    Stephane JULIANNA Bevel 04/22/2024, 1:16 PM

## 2024-04-22 NOTE — Telephone Encounter (Signed)
 Patient scheduled.

## 2024-04-22 NOTE — Progress Notes (Addendum)
 PROGRESS NOTE                                                                                                                                                                                                             Patient Demographics:    Cody Glenn, is a 68 y.o. male, DOB - September 21, 1955, FMW:[id number]  Outpatient Primary MD for the patient is [name], MD    LOS - 6  Admit date - 04/15/2024    Chief Complaint  Patient presents with   Hypoglycemia   Alcohol Intoxication       Brief Narrative (HPI from H&P)    68 y.o. year old male with past medical history of heavy alcohol use, DVT, PUD, history of partial gastrectomy, active smoking, prediabetes, primary hypertension, cervical spinal stenosis, and homelessness.  He presents to [name], ED after being found by a bystander in a parking lot.  On EMS arrival patient was hypoglycemic and was given an amp of D50.  In the ER workup consistent with hyponatremia, dehydration with multiple electrolyte abnormalities.  Chest x-ray also showed left upper lobe cavitary lesion.   Subjective:   Ejective   Assessment  & Plan :    Severe dehydration, hyponatremia, hypomagnesemia, hypocalcemia, hypophosphatemia.  In a patient with ongoing alcohol abuse, homeless. Nephrology on board being gently hydrated, sodium gradually improving, electrolytes have been replaced and will be monitored.   Left lung cavitary lesion with high fever.  Septic.  Homeless, alcoholic, high risk for chronic aspiration, sepsis workup initiated, IV fluids, pancultured, CT chest confirms left-sided cavitary lesion questionable abscess, he has had pulmonary evaluation for the same few weeks ago and had received antibiotics at that time, currently on Unasyn , case discussed with ID Dr. Montie Cleverly on 04/21/2024 total 2 weeks of Unasyn /Augmentin  along with Zyvox  with outpatient pulmonary and ID follow-up  postdischarge.  Seen and followed by speech and pulmonary this admission.  He underwent bronchoscopy with biopsies on 04/19/2024, follow results.  Cultures thus far negative.  Clinically improving.  # Shigella gastroenteritis with diarrhea. 10 days of azithromycin .  ##Protein Calorie Malnutrition  Dietitian consulted.   #Alcohol use disorder Patient reports drinking 6-8 beers daily, reports last drink was yesterday - Patient counseled on importance of cessation - CIWA protocol with PRN symptom triggered Ativan    #Tobacco Use Disorder -  Patient counseled on importance of cessation - Nicotine  replacement therapy with nicotine  patch and gum while inpatient  #Normocytic Anemia Stable   #COPD Not on any outpatient maintenance inhalers -PRN Albuterol    #Hypertension - Patient reports he is not taking Diovan  - BP normotensive, monitor   #Peptic Ulcer Disease - Protonix    #Chronic Neck Pain - Lidocaine  patch - PRN toradol   #Normocytic Anemia Stable   #COPD Not on any outpatient maintenance inhalers -PRN Albuterol    #Hypertension - Patient reports he is not taking Diovan  - BP normotensive, monitor   #Peptic Ulcer Disease - Protonix    #Chronic Neck Pain - Lidocaine  patch - PRN toradol   ## Mildly elevated free T4 levels with stable TSH.  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    Lab Results  Component Value Date   HGBA1C 4.1 (L) 04/16/2024   CBG (last 3)  Recent Labs    04/22/24 0006 04/22/24 0511 04/22/24 0842  GLUCAP 98 82 133*        Condition - Extremely Guarded  Family Communication  : None present  Code Status : Full code  Consults  : Pulmonary, nephrology  PUD Prophylaxis :  PPI   Procedures  :     Date:04/19/24    Provider  Performing:[name]    Procedure(s):  Flexible bronchoscopy with bronchial alveolar lavage   CT chest with IV contrast ordered 04/17/2024 - 1. Increasing size of the thick walled cavitary lesion within the left upper lobe, consistent with progressive cavitary pneumonia. While the soft tissue nodule within the inferior aspect of the lesion is no longer visualized, there has been marked progression in the mural thickening seen along the inferior extent, consistent with progressive infection. There is communication between this cavitary lesion and a subsegmental left upper lobe bronchus as described above. 2. Worsening ground-glass airspace disease within the lingula and left lower lobe, compatible with worsening infection. 3. Trace bilateral pleural effusions, left greater than right. 4. Aortic Atherosclerosis (ICD10-I70.0) and Emphysema (ICD10-J43.9).       Disposition Plan  :    Status is: Inpatient  DVT Prophylaxis  :    heparin  injection 5,000 Units Start: 04/17/24 1400 Place and maintain sequential compression device Start: 04/16/24 1157    Lab Results  Component Value Date   PLT 276 04/22/2024    Diet :  Diet Order             DIET DYS 2 Room service appropriate? No; Fluid consistency: Thin; Fluid restriction: 1200 mL Fluid  Diet effective now                    Inpatient Medications  Scheduled Meds:  azithromycin   500 mg Oral Daily   feeding supplement  1 Container Oral TID BM   folic acid   1 mg Oral Daily   heparin  injection (subcutaneous)  5,000 Units Subcutaneous Q8H   insulin  aspart  0-9 Units Subcutaneous TID WC   lidocaine   1 patch Transdermal Q24H   multivitamin with minerals  1 tablet Oral Daily   nicotine   14 mg Transdermal Daily   pantoprazole   40 mg Oral Daily   sodium chloride   1 g Oral TID WC   thiamine   100 mg  Oral Daily   Or   thiamine   100 mg Intravenous Daily   Continuous Infusions:  ampicillin -sulbactam (UNASYN ) IV 3 g (04/22/24  0514)   PRN Meds:.acetaminophen  **OR** acetaminophen , albuterol , dextrose , melatonin, nicotine  polacrilex, ondansetron  **OR** ondansetron  (ZOFRAN ) IV, polyethylene glycol  Antibiotics  :    Anti-infectives (From admission, onward)    Start     Dose/Rate Route Frequency Ordered Stop   04/18/24 1100  azithromycin  (ZITHROMAX ) tablet 500 mg        500 mg Oral Daily 04/18/24 1002 04/28/24 0959   04/17/24 1200  Ampicillin -Sulbactam (UNASYN ) 3 g in sodium chloride  0.9 % 100 mL IVPB        3 g 200 mL/hr over 30 Minutes Intravenous Every 6 hours 04/17/24 1025           Objective:   Vitals:   04/21/24 2116 04/22/24 0005 04/22/24 0515 04/22/24 0841  BP: 107/84 (!) 114/92 (!) 120/99 99/65  Pulse: 91 88 82   Resp: 18 (!) 21 (!) 21 20  Temp: 98.9 F (37.2 C) 98 F (36.7 C) 98.5 F (36.9 C) 97.6 F (36.4 C)  TempSrc: Oral Oral Oral Oral  SpO2: 95% 92% 97%   Weight:      Height:        Wt Readings from Last 3 Encounters:  04/19/24 68 kg  03/18/24 69 kg  02/20/24 69 kg     Intake/Output Summary (Last 24 hours) at 04/22/2024 0946 Last data filed at 04/22/2024 0842 Gross per 24 hour  Intake --  Output 1200 ml  Net -1200 ml     Physical Exam  Awake Alert, No new F.N deficits, Normal affect [place].AT,PERRAL Supple Neck, No JVD,   Symmetrical Chest wall movement, coarse bilateral breath sounds RRR,No Gallops,Rubs or new Murmurs,  +ve B.Sounds, Abd Soft, No tenderness,   No Cyanosis, Clubbing or edema        Data Review:    Recent Labs  Lab 04/18/24 0253 04/19/24 0340 04/20/24 0400 04/21/24 0421 04/22/24 0445  WBC 15.2* 11.4* 7.5 8.9 8.1  HGB 8.4* 7.9* 9.0* 8.7* 8.5*  HCT 23.2* 22.7* 26.8* 26.0* 25.4*  PLT 202 219 227 257 276  MCV 95.1 97.0 98.9 99.6 98.4  MCH 34.4* 33.8 33.2 33.3 32.9  MCHC 36.2* 34.8 33.6 33.5 33.5  RDW 13.2 13.7 13.5 13.6 13.5  LYMPHSABS 1.3 1.6 1.0 2.2 2.1  MONOABS 1.2* 0.9 0.5 0.6 0.8  EOSABS 0.0 0.1 0.0 0.3 0.4  BASOSABS 0.1 0.1 0.0 0.1  0.1    Recent Labs  Lab 04/16/24 0743 04/16/24 1101 04/16/24 1610 04/17/24 0807 04/17/24 1059 04/17/24 1442 04/17/24 2013 04/18/24 0253 04/19/24 0340 04/20/24 0400 04/21/24 0421 04/22/24 0445  NA 115* 118*   < > 124*  --  121*   < > 130* 130* 130* 133* 134*  K 3.7  --    < > 3.7  --  3.1*   < > 3.3* 3.7 4.6 4.7 4.2  CL 82*  --    < > 90*  --  94*   < > 100 105 102 101 103  CO2 20*  --    < > 23  --  18*   < > 22 20* 18* 22 19*  ANIONGAP 13  --    < > 11  --  9   < > 8 5 10 10 12   GLUCOSE 88  --    < > 86  --  216*   < >  101* 120* 145* 85 84  BUN 7*  --    < > 8  --  7*   < > 5* 5* 8 10 8   CREATININE 0.44*  --    < > 0.59*  --  0.75   < > 0.56* 0.50* 0.55* 0.55* 0.51*  AST 35  --    < > 32  --   --   --  22 21 22 31 26   ALT 16  --    < > 17  --   --   --  13 14 14 20 20   ALKPHOS 66  --    < > 71  --   --   --  58 54 61 59 53  BILITOT 1.0  --    < > 1.2  --   --   --  0.8 0.3 0.4 0.4 0.4  ALBUMIN 2.0*  --    < > 2.2*  --   --   --  1.7* 1.6* 1.8* 1.8* 1.6*  CRP  --   --   --   --   --   --   --  12.6* 13.2* 8.7* 4.6*  --   PROCALCITON  --   --   --   --   --   --   --  6.72 2.11 0.73 0.40 0.12  INR  --   --   --  1.0  --   --   --   --   --   --   --   --   TSH 0.880  --   --   --  0.639  --   --   --   --   --   --   --   HGBA1C  --  4.1*  --   --   --   --   --   --   --   --   --   --   AMMONIA  --   --    < > <13  --   --   --  27 31 18  <13 24  MG 3.2*  --    < >  --   --  2.0  --  1.8 1.5* 1.9 1.7  --   PHOS 2.5  --   --  2.1*  --   --   --  2.9 2.0* 3.5 3.4  --   CALCIUM  8.0*  --    < > 8.2*  --  7.4*   < > 7.7* 7.6* 8.1* 8.1* 7.9*   < > = values in this interval not displayed.      Recent Labs  Lab 04/16/24 0743 04/16/24 1101 04/16/24 1610 04/17/24 0807 04/17/24 1059 04/17/24 1442 04/17/24 2013 04/18/24 0253 04/19/24 0340 04/20/24 0400 04/21/24 0421 04/22/24 0445  CRP  --   --   --   --   --   --   --  12.6* 13.2* 8.7* 4.6*  --   PROCALCITON  --   --    --   --   --   --   --  6.72 2.11 0.73 0.40 0.12  INR  --   --   --  1.0  --   --   --   --   --   --   --   --   TSH 0.880  --   --   --  0.639  --   --   --   --   --   --   --  HGBA1C  --  4.1*  --   --   --   --   --   --   --   --   --   --   AMMONIA  --   --    < > <13  --   --   --  27 31 18  <13 24  MG 3.2*  --    < >  --   --  2.0  --  1.8 1.5* 1.9 1.7  --   CALCIUM  8.0*  --    < > 8.2*  --  7.4*   < > 7.7* 7.6* 8.1* 8.1* 7.9*   < > = values in this interval not displayed.    Lab Results  Component Value Date   CHOL 213 (H) 10/13/2020   HDL 106 10/13/2020   LDLCALC 93 10/13/2020   TRIG 84 10/13/2020   CHOLHDL 2.0 10/13/2020    Lab Results  Component Value Date   HGBA1C 4.1 (L) 04/16/2024   No results for input(s): TSH, T4TOTAL, FREET4, T3FREE, THYROIDAB in the last 72 hours.  No results for input(s): VITAMINB12, FOLATE, FERRITIN, TIBC, IRON, RETICCTPCT in the last 72 hours. ------------------------------------------------------------------------------------------------------------------ Cardiac Enzymes No results for input(s): CKMB, TROPONINI, MYOGLOBIN in the last 168 hours.  Invalid input(s): CK  Micro Results Recent Results (from the past 240 hours)  Respiratory (~20 pathogens) panel by PCR     Status: None   Collection Time: 04/17/24 10:02 AM   Specimen: Nasopharyngeal Swab; Respiratory  Result Value Ref Range Status   Adenovirus NOT DETECTED NOT DETECTED Final   Coronavirus 229E NOT DETECTED NOT DETECTED Final    Comment: (NOTE) The Coronavirus on the Respiratory Panel, DOES NOT test for the novel  Coronavirus (2019 nCoV)    Coronavirus HKU1 NOT DETECTED NOT DETECTED Final   Coronavirus NL63 NOT DETECTED NOT DETECTED Final   Coronavirus OC43 NOT DETECTED NOT DETECTED Final   Metapneumovirus NOT DETECTED NOT DETECTED Final   Rhinovirus / Enterovirus NOT DETECTED NOT DETECTED Final   Influenza A NOT DETECTED NOT DETECTED Final    Influenza B NOT DETECTED NOT DETECTED Final   Parainfluenza Virus 1 NOT DETECTED NOT DETECTED Final   Parainfluenza Virus 2 NOT DETECTED NOT DETECTED Final   Parainfluenza Virus 3 NOT DETECTED NOT DETECTED Final   Parainfluenza Virus 4 NOT DETECTED NOT DETECTED Final   Respiratory Syncytial Virus NOT DETECTED NOT DETECTED Final   Bordetella pertussis NOT DETECTED NOT DETECTED Final   Bordetella Parapertussis NOT DETECTED NOT DETECTED Final   Chlamydophila pneumoniae NOT DETECTED NOT DETECTED Final   Mycoplasma pneumoniae NOT DETECTED NOT DETECTED Final    Comment: Performed at [facility], [address], [place], [place] [address]  SARS Coronavirus 2 by RT PCR (hospital order, performed in [facility] hospital lab) *cepheid single result test* Nasal Mucosa     Status: None   Collection Time: 04/17/24 10:02 AM   Specimen: Nasal Mucosa; Nasal Swab  Result Value Ref Range Status   SARS Coronavirus 2 by RT PCR NEGATIVE NEGATIVE Final    Comment: Performed at [facility], [address], [place], [place] [address]  Culture, blood (Routine X 2) w Reflex to ID Panel     Status: None   Collection Time: 04/17/24 10:55 AM   Specimen: BLOOD LEFT ARM  Result Value Ref Range Status   Specimen Description BLOOD LEFT ARM  Final   Special Requests   Final  BOTTLES DRAWN AEROBIC AND ANAEROBIC Blood Culture results may not be optimal due to an inadequate volume of blood received in culture bottles   Culture   Final    NO GROWTH 5 DAYS Performed at [facility], [address], [place], [place] [address]    Report Status 04/22/2024 FINAL  Final  Culture, blood (Routine X 2) w Reflex to ID Panel     Status: None   Collection Time: 04/17/24 10:59 AM   Specimen: BLOOD  Result Value Ref Range Status   Specimen Description BLOOD SITE NOT SPECIFIED  Final   Special Requests   Final    BOTTLES DRAWN AEROBIC AND ANAEROBIC Blood Culture results may not be optimal due to an inadequate  volume of blood received in culture bottles   Culture   Final    NO GROWTH 5 DAYS Performed at [facility], [address], [place], [place] [address]    Report Status 04/22/2024 FINAL  Final  Gastrointestinal Panel by PCR , Stool     Status: Abnormal   Collection Time: 04/18/24 12:38 AM   Specimen: Stool  Result Value Ref Range Status   Campylobacter species NOT DETECTED NOT DETECTED Final   Plesimonas shigelloides NOT DETECTED NOT DETECTED Final   Salmonella species NOT DETECTED NOT DETECTED Final   Yersinia enterocolitica NOT DETECTED NOT DETECTED Final   Vibrio species NOT DETECTED NOT DETECTED Final   Vibrio cholerae NOT DETECTED NOT DETECTED Final   Enteroaggregative E coli (EAEC) NOT DETECTED NOT DETECTED Final   Enteropathogenic E coli (EPEC) DETECTED (A) NOT DETECTED Final    Comment: RESULT CALLED TO, READ BACK BY AND VERIFIED WITH: KEDRIC DARING RN [phone] 04/18/24 HNM    Enterotoxigenic E coli (ETEC) NOT DETECTED NOT DETECTED Final   Shiga like toxin producing E coli (STEC) NOT DETECTED NOT DETECTED Final   Shigella/Enteroinvasive E coli (EIEC) DETECTED (A) NOT DETECTED Final    Comment: RESULT CALLED TO, READ BACK BY AND VERIFIED WITH: KEDRIC DARING RN [phone] 04/18/24 HNM    Cryptosporidium NOT DETECTED NOT DETECTED Final   Cyclospora cayetanensis NOT DETECTED NOT DETECTED Final   Entamoeba histolytica NOT DETECTED NOT DETECTED Final   Giardia lamblia NOT DETECTED NOT DETECTED Final   Adenovirus F40/41 NOT DETECTED NOT DETECTED Final   Astrovirus NOT DETECTED NOT DETECTED Final   Norovirus GI/GII NOT DETECTED NOT DETECTED Final   Rotavirus A NOT DETECTED NOT DETECTED Final   Sapovirus (I, II, IV, and V) NOT DETECTED NOT DETECTED Final    Comment: Performed at [facility], [address] [place], [place] [address]  MRSA Next Gen by PCR, Nasal     Status: None   Collection Time: 04/18/24  5:45 AM   Specimen: Nasal Mucosa; Nasal Swab  Result Value Ref  Range Status   MRSA by PCR Next Gen NOT DETECTED NOT DETECTED Final    Comment: (NOTE) The GeneXpert MRSA Assay (FDA approved for NASAL specimens only), is one component of a comprehensive MRSA colonization surveillance program. It is not intended to diagnose MRSA infection nor to guide or monitor treatment for MRSA infections. Test performance is not FDA approved in patients less than 39 years old. Performed at [facility], [address] [place], [place] [address]   Culture, BAL-quantitative w Gram Stain     Status: None (Preliminary result)   Collection Time: 04/19/24 12:38 PM   Specimen: Bronchial Alveolar Lavage; Respiratory  Result Value Ref  Range Status   Specimen Description BRONCHIAL ALVEOLAR LAVAGE  Final   Special Requests NONE  Final   Gram Stain NO WBC SEEN NO ORGANISMS SEEN   Final   Culture   Final    CULTURE REINCUBATED FOR BETTER GROWTH Performed at [facility], [address], [place], [place] [address]    Report Status PENDING  Incomplete  Acid Fast Smear (AFB)     Status: None   Collection Time: 04/19/24 12:38 PM   Specimen: Bronchial Alveolar Lavage; Respiratory  Result Value Ref Range Status   AFB Specimen Processing Concentration  Final   Acid Fast Smear Negative  Final    Comment: (NOTE) Performed At: [facility] [address] [place], [place] [id number] Jennette Shorter MD Ey:[phone]    Source (AFB) BRONCHIAL ALVEOLAR LAVAGE  Final    Comment: Performed at [facility], [address], [place], [place] [address]    Radiology Report No results found.    Signature  -   Lavada Stank M.D on 04/22/2024 at 9:46 AM   -  To page go to [email or web address]

## 2024-04-22 NOTE — Telephone Encounter (Signed)
 Please schedule patient for follow up visit with me in 4-6 weeks for cavitary lung lesion  Thanks, JD

## 2024-04-23 ENCOUNTER — Inpatient Hospital Stay (HOSPITAL_COMMUNITY)

## 2024-04-23 ENCOUNTER — Other Ambulatory Visit: Payer: Self-pay

## 2024-04-23 DIAGNOSIS — E871 Hypo-osmolality and hyponatremia: Secondary | ICD-10-CM | POA: Diagnosis not present

## 2024-04-23 LAB — GLUCOSE, CAPILLARY
Glucose-Capillary: 88 mg/dL (ref 70–99)
Glucose-Capillary: 100 mg/dL — ABNORMAL HIGH (ref 70–99)
Glucose-Capillary: 89 mg/dL (ref 70–99)

## 2024-04-23 MED ORDER — LEVALBUTEROL HCL 1.25 MG/0.5ML IN NEBU: 1.2500 mg | INHALATION_SOLUTION | Freq: Four times a day (QID) | RESPIRATORY_TRACT | Status: DC | PRN

## 2024-04-23 MED ORDER — IPRATROPIUM BROMIDE 0.02 % IN SOLN
0.5000 mg | Freq: Four times a day (QID) | RESPIRATORY_TRACT | Status: DC
  Administered 2024-04-24: 0.5 mg via RESPIRATORY_TRACT
  Filled 2024-04-23 (×2): qty 2.5

## 2024-04-23 MED ORDER — METOPROLOL TARTRATE 5 MG/5ML IV SOLN
2.5000 mg | INTRAVENOUS | Status: AC | PRN
  Administered 2024-04-26 (×2): 2.5 mg via INTRAVENOUS
  Filled 2024-04-23 (×2): qty 5

## 2024-04-23 NOTE — Progress Notes (Signed)
   04/23/24 1020  Mobility  Activity Moved bed into chair position  Level of Assistance Standby assist, set-up cues, supervision of patient - no hands on  Assistive Device  (Bedrails)  Activity Response Tolerated fair  Mobility Referral Yes  Mobility visit 1 Mobility  Mobility Specialist Start Time (ACUTE ONLY) 1020  Mobility Specialist Stop Time (ACUTE ONLY) 1034  Mobility Specialist Time Calculation (min) (ACUTE ONLY) 14 min   Mobility Specialist: Progress Note  Post-Mobility:    HR 90, SpO2 98% RA  Pt agreeable to mobility session - received in bed. C/o L sided weakness. Pt raised BUE and BLE one time each, MS applied slight resistance, pt held for 3 seconds each - deferred further mobility states he can't and becoming upset at the thought of mobilizing further. Returned to chair position in bed with all needs met - call bell within reach. Pt required heavy encouragement to mobilize. Bed alarm on.   Virgle Boards, BS Mobility Specialist Please contact via SecureChat or  Rehab office at [phone].

## 2024-04-23 NOTE — Evaluation (Signed)
 Clinical/Bedside Swallow Evaluation Patient Details  Name: BRISTON LAX MRN: [id number] Date of Birth: Jan 26, 1956  Today's Date: 04/23/2024 Time: SLP Start Time (ACUTE ONLY): 1652 SLP Stop Time (ACUTE ONLY): 1702 SLP Time Calculation (min) (ACUTE ONLY): 10 min  Past Medical History:  Past Medical History:  Diagnosis Date   Alcohol use    Hyperbilirubinemia 07/01/2020   Peptic ulcer 08/09/1987   Jul 28, 2008 Entered By: KATHERINE ETHA HERO Comment: s/p partial gastrectomy   Past Surgical History:  Past Surgical History:  Procedure Laterality Date   FLEXIBLE BRONCHOSCOPY Left 02/21/2024   Procedure: BRONCHOSCOPY, FLEXIBLE;  Surgeon: Shelah Lamar RAMAN, MD;  Location: [facility] ENDOSCOPY;  Service: Cardiopulmonary;  Laterality: Left;   FLEXIBLE BRONCHOSCOPY Bilateral 04/19/2024   Procedure: BRONCHOSCOPY, FLEXIBLE;  Surgeon: Kara Dorn NOVAK, MD;  Location: [facility] ENDOSCOPY;  Service: Pulmonary;  Laterality: Bilateral;   ulcer surgery in stomach     pt states he had this approx 33 years ago   HPI:  REON HUNLEY is a 68 yo male presenting to ED 9/8 after being found down in a parking lot with hypoglycemia, hyponatremia, and dehydration. CXR shows LUL cavitary process with associated pleural thickening and new airspace disease within the L base and R mid lung. CT Chest  worsening ground-glass airspace disease within the lingula and  left lower lobe, compatible with worsening infection.  3. Trace bilateral pleural effusions, left greater than right. Seen by SLP this admission with recommendations to resume regular/thin after initial confusion improved. PMH includes daily alcohol use, DVT, PUD, history of partial gastrectomy, current tobacco use, cervical spinal stenosis, primary hypertension, and homelessness    Assessment / Plan / Recommendation  Clinical Impression  SLP reconsulted after pt asked to advance his diet from Dys 2 solids. Most recent SLP visit 9/11 notes improved mentation and  recommended resuming regular diet with thin liquids before signing off. Mentation does appear significantly improved compared to initial evaluation completed by this SLP 9/10. He fed himself thin liquids and regular solids without overt s/s of dysphagia or aspiration, stating he feels he can masticate thoroughly despite edentulism. Will resume regular diet with thin liquids and sign off. SLP Visit Diagnosis: Dysphagia, unspecified (R13.10)    Aspiration Risk  Mild aspiration risk    Diet Recommendation Regular;Thin liquid    Liquid Administration via: Cup;Straw Medication Administration: Whole meds with liquid Supervision: Staff to assist with self feeding;Full supervision/cueing for compensatory strategies Compensations: Slow rate;Small sips/bites Postural Changes: Seated upright at 90 degrees;Remain upright for at least 30 minutes after po intake    Other  Recommendations Oral Care Recommendations: Oral care BID     Assistance Recommended at Discharge    Functional Status Assessment Patient has not had a recent decline in their functional status  Frequency and Duration            Prognosis Prognosis for improved oropharyngeal function: Good Barriers to Reach Goals: Time post onset      Swallow Study   General HPI: DERRICK TIEGS is a 68 yo male presenting to ED 9/8 after being found down in a parking lot with hypoglycemia, hyponatremia, and dehydration. CXR shows LUL cavitary process with associated pleural thickening and new airspace disease within the L base and R mid lung. CT Chest  worsening ground-glass airspace disease within the lingula and  left lower lobe, compatible with worsening infection.  3. Trace bilateral pleural effusions, left greater than right. Seen by SLP this admission with recommendations to resume regular/thin  after initial confusion improved. PMH includes daily alcohol use, DVT, PUD, history of partial gastrectomy, current tobacco use, cervical spinal  stenosis, primary hypertension, and homelessness Type of Study: Bedside Swallow Evaluation Previous Swallow Assessment: see HPI Diet Prior to this Study: Dysphagia 2 (finely chopped);Thin liquids (Level 0) Temperature Spikes Noted: No Respiratory Status: Room air History of Recent Intubation: No Behavior/Cognition: Alert;Cooperative Oral Cavity Assessment: Within Functional Limits Oral Care Completed by SLP: No Oral Cavity - Dentition: Edentulous Vision: Functional for self-feeding Self-Feeding Abilities: Able to feed self Patient Positioning: Upright in bed Baseline Vocal Quality: Normal Volitional Cough: Strong Volitional Swallow: Able to elicit    Oral/Motor/Sensory Function Overall Oral Motor/Sensory Function: Within functional limits   Ice Chips Ice chips: Not tested   Thin Liquid Thin Liquid: Within functional limits Presentation: Cup;Self Fed    Nectar Thick Nectar Thick Liquid: Not tested   Honey Thick Honey Thick Liquid: Not tested   Puree Puree: Not tested   Solid     Solid: Within functional limits Presentation: Self Fed      Damien Blumenthal, M.A., CCC-SLP Speech Language Pathology, Acute Rehabilitation Services  Secure Chat preferred [phone]  04/23/2024,5:37 PM

## 2024-04-23 NOTE — Plan of Care (Addendum)
 RN reported that patient oxygen saturation suddenly dropped 85% heart rate upper 120s range sinus tachycardia and elevated blood pressure 158/90.  Patient reporting shortness of breath and not feeling well.  -Obtaining stat chest x-ray. -Informed nurse to give Xopenex  and DuoNeb breathing treatment. -Please call RT to place high flow nasal cannula oxygen if regular nasal cannula oxygen is not sufficient and goal to keep O2 above 92%. -Checking BMP, CBC and lactic acid. - If with breathing treatment still remains tachycardic and hypertensive we will think alternative treatment. - Will follow-up with chest x-ray. - Patient being admitted for cavitary lung lesion, sepsis, severe dehydration, hyponatremia, hypomagnesemia, hypocalcemia, hypophosphatemia in the setting of ongoing alcohol use and she will associated gastroenteritis. History of COPD  Update, RN reported that after placing 2 L oxygen O2 sat improved to 94%.  Heart rate has been improved at 1 4218 and blood pressure improving.  Will continue breathing treatment and follow-up with chest x-ray and lab results.

## 2024-04-23 NOTE — Progress Notes (Signed)
 PROGRESS NOTE                                                                                                                                                                                                             Patient Demographics:    Cody Glenn, is a 68 y.o. male, DOB - 1955/09/25, FMW:[id number]  Outpatient Primary MD for the patient is [name], MD    LOS - 7  Admit date - 04/15/2024    Chief Complaint  Patient presents with   Hypoglycemia   Alcohol Intoxication       Brief Narrative (HPI from H&P)    68 y.o. year old male with past medical history of heavy alcohol use, DVT, PUD, history of partial gastrectomy, active smoking, prediabetes, primary hypertension, cervical spinal stenosis, and homelessness.  He presents to [name], ED after being found by a bystander in a parking lot.  On EMS arrival patient was hypoglycemic and was given an amp of D50.  In the ER workup consistent with hyponatremia, dehydration with multiple electrolyte abnormalities.  Chest x-ray also showed left upper lobe cavitary lesion.   Subjective:   Patient in bed, appears comfortable, denies any headache, no fever, no chest pain or pressure, no shortness of breath , no abdominal pain. No focal weakness.   Assessment  & Plan :    Severe dehydration, hyponatremia, hypomagnesemia, hypocalcemia, hypophosphatemia.  In a patient with ongoing alcohol abuse, homeless. Nephrology on board being gently hydrated, sodium gradually improving, electrolytes have been replaced and will be monitored.   Left lung cavitary lesion with high fever.  Septic.  Homeless, alcoholic, high risk for chronic aspiration, sepsis workup initiated, IV fluids, pancultured, CT chest confirms left-sided cavitary lesion questionable abscess, he has had pulmonary evaluation for the same few weeks ago and had received antibiotics at that time, currently on Unasyn ,  case discussed with ID Dr. Montie Cleverly on 04/21/2024 total 2 weeks of Unasyn /Augmentin  along with Zyvox  with outpatient pulmonary and ID follow-up postdischarge.  Seen and followed by speech and pulmonary this admission.  He underwent bronchoscopy with biopsies on 04/19/2024, follow results.  Cultures thus far negative.  Clinically improving.  Per pulmonary post discharge follow-up with Dr. Dorn Chill 2 weeks postdischarge  # Shigella gastroenteritis with diarrhea. 7 days of azithromycin .  ##Protein Calorie Malnutrition  Dietitian consulted.   #Alcohol use disorder Patient reports drinking 6-8 beers daily, reports last drink was yesterday - Patient counseled on importance of cessation - CIWA protocol with PRN symptom triggered Ativan    #Tobacco Use Disorder - Patient counseled on importance of cessation - Nicotine  replacement therapy with nicotine  patch and gum while inpatient  #Normocytic Anemia Stable   #COPD Not on any outpatient maintenance inhalers -PRN Albuterol    #Hypertension - Patient reports he is not taking Diovan  - BP normotensive, monitor   #Peptic Ulcer Disease - Protonix    #Chronic Neck Pain - Lidocaine  patch - PRN toradol   #Normocytic Anemia Stable   #COPD Not on any outpatient maintenance inhalers -PRN Albuterol    #Hypertension - Patient reports he is not taking Diovan  - BP normotensive, monitor   #Peptic Ulcer Disease - Protonix    #Chronic Neck Pain - Lidocaine  patch - PRN toradol   ## Mildly elevated free T4 levels with stable TSH.  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    Lab Results  Component Value Date   HGBA1C 4.1 (L) 04/16/2024   CBG (last 3)  Recent Labs    04/22/24 1659 04/22/24 2043 04/23/24 0858   GLUCAP 79 121* 88        Condition - Extremely Guarded  Family Communication  : None present  Code Status : Full code  Consults  : Pulmonary, nephrology  PUD Prophylaxis :  PPI   Procedures  :     Date:04/19/24    Provider Performing:[name]    Procedure(s):  Flexible bronchoscopy with bronchial alveolar lavage   CT chest with IV contrast ordered 04/17/2024 - 1. Increasing size of the thick walled cavitary lesion within the left upper lobe, consistent with progressive cavitary pneumonia. While the soft tissue nodule within the inferior aspect of the lesion is no longer visualized, there has been marked progression in the mural thickening seen along the inferior extent, consistent with progressive infection. There is communication between this cavitary lesion and a subsegmental left upper lobe bronchus as described above. 2. Worsening ground-glass airspace disease within the lingula and left lower lobe, compatible with worsening infection. 3. Trace bilateral pleural effusions, left greater than right. 4. Aortic Atherosclerosis (ICD10-I70.0) and Emphysema (ICD10-J43.9).       Disposition Plan  :    Status is: Inpatient  DVT Prophylaxis  :    heparin  injection 5,000 Units Start: 04/17/24 1400 Place and maintain sequential compression device Start: 04/16/24 1157    Lab Results  Component Value Date   PLT 276 04/22/2024    Diet :  Diet Order             DIET DYS 2 Room service appropriate? No; Fluid consistency: Thin; Fluid restriction: 1200 mL Fluid  Diet effective now                    Inpatient Medications  Scheduled Meds:  azithromycin   500 mg Oral Daily   feeding supplement  1 Container Oral TID BM   folic acid   1 mg Oral Daily   heparin  injection (subcutaneous)  5,000 Units Subcutaneous Q8H   insulin  aspart  0-9 Units Subcutaneous TID WC   lidocaine   1 patch Transdermal Q24H   linezolid   600 mg Oral Q12H   multivitamin with minerals  1  tablet Oral Daily   nicotine   14 mg Transdermal Daily   pantoprazole   40 mg Oral Daily   sodium chloride   1 g Oral TID WC   thiamine   100 mg Oral Daily   Or   thiamine   100 mg Intravenous Daily   Continuous Infusions:  ampicillin -sulbactam (UNASYN ) IV 3 g (04/23/24 0553)   PRN Meds:.acetaminophen  **OR** acetaminophen , albuterol , dextrose , melatonin, nicotine  polacrilex, ondansetron  **OR** ondansetron  (ZOFRAN ) IV, polyethylene glycol  Antibiotics  :    Anti-infectives (From admission, onward)    Start     Dose/Rate Route Frequency Ordered Stop   04/22/24 1045  linezolid  (ZYVOX ) tablet 600 mg        600 mg Oral Every 12 hours 04/22/24 0948 05/06/24 0959   04/18/24 1100  azithromycin  (ZITHROMAX ) tablet 500 mg        500 mg Oral Daily 04/18/24 1002 04/25/24 0959   04/17/24 1200  Ampicillin -Sulbactam (UNASYN ) 3 g in sodium chloride  0.9 % 100 mL IVPB        3 g 200 mL/hr over 30 Minutes Intravenous Every 6 hours 04/17/24 1025           Objective:   Vitals:   04/23/24 0500 04/23/24 0700 04/23/24 0854 04/23/24 0900  BP: 106/74     Pulse: 84     Resp: 19     Temp: 98.7 F (37.1 C)  97.7 F (36.5 C) 97.7 F (36.5 C)  TempSrc: Oral Oral Oral Oral  SpO2: 97%     Weight:      Height:        Wt Readings from Last 3 Encounters:  04/19/24 68 kg  03/18/24 69 kg  02/20/24 69 kg     Intake/Output Summary (Last 24 hours) at 04/23/2024 0950 Last data filed at 04/22/2024 1600 Gross per 24 hour  Intake --  Output 400 ml  Net -400 ml     Physical Exam  Awake Alert, No new F.N deficits, Normal affect [place].AT,PERRAL Supple Neck, No JVD,   Symmetrical Chest wall movement, coarse bilateral breath sounds RRR,No Gallops,Rubs or new Murmurs,  +ve B.Sounds, Abd Soft, No tenderness,   No Cyanosis, Clubbing or edema        Data Review:    Recent Labs  Lab 04/18/24 0253 04/19/24 0340 04/20/24 0400 04/21/24 0421 04/22/24 0445  WBC 15.2* 11.4* 7.5 8.9 8.1  HGB 8.4* 7.9*  9.0* 8.7* 8.5*  HCT 23.2* 22.7* 26.8* 26.0* 25.4*  PLT 202 219 227 257 276  MCV 95.1 97.0 98.9 99.6 98.4  MCH 34.4* 33.8 33.2 33.3 32.9  MCHC 36.2* 34.8 33.6 33.5 33.5  RDW 13.2 13.7 13.5 13.6 13.5  LYMPHSABS 1.3 1.6 1.0 2.2 2.1  MONOABS 1.2* 0.9 0.5 0.6 0.8  EOSABS 0.0 0.1 0.0 0.3 0.4  BASOSABS 0.1 0.1 0.0 0.1 0.1    Recent Labs  Lab 04/16/24 1101 04/16/24 1610 04/17/24 0807 04/17/24 1059 04/17/24 1442 04/17/24 2013 04/18/24 0253 04/19/24 0340 04/20/24 0400 04/21/24 0421 04/22/24 0445  NA 118*   < > 124*  --  121*   < > 130* 130* 130* 133* 134*  K  --    < > 3.7  --  3.1*   < > 3.3* 3.7 4.6 4.7 4.2  CL  --    < > 90*  --  94*   < > 100 105 102 101 103  CO2  --    < >  23  --  18*   < > 22 20* 18* 22 19*  ANIONGAP  --    < > 11  --  9   < > 8 5 10 10 12   GLUCOSE  --    < > 86  --  216*   < > 101* 120* 145* 85 84  BUN  --    < > 8  --  7*   < > 5* 5* 8 10 8   CREATININE  --    < > 0.59*  --  0.75   < > 0.56* 0.50* 0.55* 0.55* 0.51*  AST  --    < > 32  --   --   --  22 21 22 31 26   ALT  --    < > 17  --   --   --  13 14 14 20 20   ALKPHOS  --    < > 71  --   --   --  58 54 61 59 53  BILITOT  --    < > 1.2  --   --   --  0.8 0.3 0.4 0.4 0.4  ALBUMIN  --    < > 2.2*  --   --   --  1.7* 1.6* 1.8* 1.8* 1.6*  CRP  --   --   --   --   --   --  12.6* 13.2* 8.7* 4.6*  --   PROCALCITON  --   --   --   --   --   --  6.72 2.11 0.73 0.40 0.12  INR  --   --  1.0  --   --   --   --   --   --   --   --   TSH  --   --   --  0.639  --   --   --   --   --   --   --   HGBA1C 4.1*  --   --   --   --   --   --   --   --   --   --   AMMONIA  --    < > <13  --   --   --  27 31 18  <13 24  MG  --    < >  --   --  2.0  --  1.8 1.5* 1.9 1.7  --   PHOS  --   --  2.1*  --   --   --  2.9 2.0* 3.5 3.4  --   CALCIUM   --    < > 8.2*  --  7.4*   < > 7.7* 7.6* 8.1* 8.1* 7.9*   < > = values in this interval not displayed.      Recent Labs  Lab 04/16/24 1101 04/16/24 1610 04/17/24 0807 04/17/24 1059  04/17/24 1442 04/17/24 2013 04/18/24 0253 04/19/24 0340 04/20/24 0400 04/21/24 0421 04/22/24 0445  CRP  --   --   --   --   --   --  12.6* 13.2* 8.7* 4.6*  --   PROCALCITON  --   --   --   --   --   --  6.72 2.11 0.73 0.40 0.12  INR  --   --  1.0  --   --   --   --   --   --   --   --  TSH  --   --   --  0.639  --   --   --   --   --   --   --   HGBA1C 4.1*  --   --   --   --   --   --   --   --   --   --   AMMONIA  --    < > <13  --   --   --  27 31 18  <13 24  MG  --    < >  --   --  2.0  --  1.8 1.5* 1.9 1.7  --   CALCIUM   --    < > 8.2*  --  7.4*   < > 7.7* 7.6* 8.1* 8.1* 7.9*   < > = values in this interval not displayed.    Lab Results  Component Value Date   CHOL 213 (H) 10/13/2020   HDL 106 10/13/2020   LDLCALC 93 10/13/2020   TRIG 84 10/13/2020   CHOLHDL 2.0 10/13/2020    Lab Results  Component Value Date   HGBA1C 4.1 (L) 04/16/2024   No results for input(s): TSH, T4TOTAL, FREET4, T3FREE, THYROIDAB in the last 72 hours.  No results for input(s): VITAMINB12, FOLATE, FERRITIN, TIBC, IRON, RETICCTPCT in the last 72 hours. ------------------------------------------------------------------------------------------------------------------ Cardiac Enzymes No results for input(s): CKMB, TROPONINI, MYOGLOBIN in the last 168 hours.  Invalid input(s): CK  Micro Results Recent Results (from the past 240 hours)  Respiratory (~20 pathogens) panel by PCR     Status: None   Collection Time: 04/17/24 10:02 AM   Specimen: Nasopharyngeal Swab; Respiratory  Result Value Ref Range Status   Adenovirus NOT DETECTED NOT DETECTED Final   Coronavirus 229E NOT DETECTED NOT DETECTED Final    Comment: (NOTE) The Coronavirus on the Respiratory Panel, DOES NOT test for the novel  Coronavirus (2019 nCoV)    Coronavirus HKU1 NOT DETECTED NOT DETECTED Final   Coronavirus NL63 NOT DETECTED NOT DETECTED Final   Coronavirus OC43 NOT DETECTED NOT DETECTED Final    Metapneumovirus NOT DETECTED NOT DETECTED Final   Rhinovirus / Enterovirus NOT DETECTED NOT DETECTED Final   Influenza A NOT DETECTED NOT DETECTED Final   Influenza B NOT DETECTED NOT DETECTED Final   Parainfluenza Virus 1 NOT DETECTED NOT DETECTED Final   Parainfluenza Virus 2 NOT DETECTED NOT DETECTED Final   Parainfluenza Virus 3 NOT DETECTED NOT DETECTED Final   Parainfluenza Virus 4 NOT DETECTED NOT DETECTED Final   Respiratory Syncytial Virus NOT DETECTED NOT DETECTED Final   Bordetella pertussis NOT DETECTED NOT DETECTED Final   Bordetella Parapertussis NOT DETECTED NOT DETECTED Final   Chlamydophila pneumoniae NOT DETECTED NOT DETECTED Final   Mycoplasma pneumoniae NOT DETECTED NOT DETECTED Final    Comment: Performed at [facility], [address], [place], [place] [address]  SARS Coronavirus 2 by RT PCR (hospital order, performed in [facility] hospital lab) *cepheid single result test* Nasal Mucosa     Status: None   Collection Time: 04/17/24 10:02 AM   Specimen: Nasal Mucosa; Nasal Swab  Result Value Ref Range Status   SARS Coronavirus 2 by RT PCR NEGATIVE NEGATIVE Final    Comment: Performed at [facility], [address], [place], [place] [address]  Culture, blood (Routine X 2) w Reflex to ID Panel     Status: None   Collection Time: 04/17/24 10:55 AM   Specimen:  BLOOD LEFT ARM  Result Value Ref Range Status   Specimen Description BLOOD LEFT ARM  Final   Special Requests   Final    BOTTLES DRAWN AEROBIC AND ANAEROBIC Blood Culture results may not be optimal due to an inadequate volume of blood received in culture bottles   Culture   Final    NO GROWTH 5 DAYS Performed at [facility], [address], [place], [place] [address]    Report Status 04/22/2024 FINAL  Final  Culture, blood (Routine X 2) w Reflex to ID Panel     Status: None   Collection Time: 04/17/24 10:59 AM   Specimen: BLOOD  Result Value Ref Range Status   Specimen Description  BLOOD SITE NOT SPECIFIED  Final   Special Requests   Final    BOTTLES DRAWN AEROBIC AND ANAEROBIC Blood Culture results may not be optimal due to an inadequate volume of blood received in culture bottles   Culture   Final    NO GROWTH 5 DAYS Performed at [facility], [address], [place], [place] [address]    Report Status 04/22/2024 FINAL  Final  Gastrointestinal Panel by PCR , Stool     Status: Abnormal   Collection Time: 04/18/24 12:38 AM   Specimen: Stool  Result Value Ref Range Status   Campylobacter species NOT DETECTED NOT DETECTED Final   Plesimonas shigelloides NOT DETECTED NOT DETECTED Final   Salmonella species NOT DETECTED NOT DETECTED Final   Yersinia enterocolitica NOT DETECTED NOT DETECTED Final   Vibrio species NOT DETECTED NOT DETECTED Final   Vibrio cholerae NOT DETECTED NOT DETECTED Final   Enteroaggregative E coli (EAEC) NOT DETECTED NOT DETECTED Final   Enteropathogenic E coli (EPEC) DETECTED (A) NOT DETECTED Final    Comment: RESULT CALLED TO, READ BACK BY AND VERIFIED WITH: KEDRIC DARING RN [phone] 04/18/24 HNM    Enterotoxigenic E coli (ETEC) NOT DETECTED NOT DETECTED Final   Shiga like toxin producing E coli (STEC) NOT DETECTED NOT DETECTED Final   Shigella/Enteroinvasive E coli (EIEC) DETECTED (A) NOT DETECTED Final    Comment: RESULT CALLED TO, READ BACK BY AND VERIFIED WITH: KEDRIC DARING RN [phone] 04/18/24 HNM    Cryptosporidium NOT DETECTED NOT DETECTED Final   Cyclospora cayetanensis NOT DETECTED NOT DETECTED Final   Entamoeba histolytica NOT DETECTED NOT DETECTED Final   Giardia lamblia NOT DETECTED NOT DETECTED Final   Adenovirus F40/41 NOT DETECTED NOT DETECTED Final   Astrovirus NOT DETECTED NOT DETECTED Final   Norovirus GI/GII NOT DETECTED NOT DETECTED Final   Rotavirus A NOT DETECTED NOT DETECTED Final   Sapovirus (I, II, IV, and V) NOT DETECTED NOT DETECTED Final    Comment: Performed at [facility], [address]  [place], [place] [address]  MRSA Next Gen by PCR, Nasal     Status: None   Collection Time: 04/18/24  5:45 AM   Specimen: Nasal Mucosa; Nasal Swab  Result Value Ref Range Status   MRSA by PCR Next Gen NOT DETECTED NOT DETECTED Final    Comment: (NOTE) The GeneXpert MRSA Assay (FDA approved for NASAL specimens only), is one component of a comprehensive MRSA colonization surveillance program. It is not intended to diagnose MRSA infection nor to guide or monitor treatment for MRSA infections. Test performance is not FDA approved in patients less than 44 years old. Performed at [facility], [address] [place], [place] [address]   Culture, BAL-quantitative w  Gram Stain     Status: None (Preliminary result)   Collection Time: 04/19/24 12:38 PM   Specimen: Bronchial Alveolar Lavage; Respiratory  Result Value Ref Range Status   Specimen Description BRONCHIAL ALVEOLAR LAVAGE  Final   Special Requests NONE  Final   Gram Stain NO WBC SEEN NO ORGANISMS SEEN   Final   Culture   Final    CULTURE REINCUBATED FOR BETTER GROWTH Performed at [facility], [address] [place], [place] [address]    Report Status PENDING  Incomplete  Acid Fast Smear (AFB)     Status: None   Collection Time: 04/19/24 12:38 PM   Specimen: Bronchial Alveolar Lavage; Respiratory  Result Value Ref Range Status   AFB Specimen Processing Concentration  Final   Acid Fast Smear Negative  Final    Comment: (NOTE) Performed At: [facility] [address] [place], [place] [id number] Jennette Shorter MD Ey:[id number]    Source (AFB) BRONCHIAL ALVEOLAR LAVAGE  Final    Comment: Performed at [facility], [address] [place], [place] [address]    Radiology Report DG Chest Port 1 View Result Date: 04/23/2024 CLINICAL DATA:  Shortness of breath. EXAM: PORTABLE CHEST 1 VIEW COMPARISON:  04/17/2024 FINDINGS: Interval progression of diffuse interstitial and patchy alveolar opacity.  Pleuroparenchymal scarring in the left upper lobe is similar to prior. Pleural thickening versus small pleural effusion seen at the left base. The cardio pericardial silhouette is enlarged. No acute bony abnormality. Telemetry leads overlie the chest. IMPRESSION: Interval progression of diffuse interstitial and patchy alveolar opacity. Imaging features compatible with pulmonary edema or diffuse infection. Electronically Signed   By: Camellia Candle M.D.   On: 04/23/2024 06:52      Signature  -   Lavada Stank M.D on 04/23/2024 at 9:50 AM   -  To page go to [email or web address]

## 2024-04-23 NOTE — Plan of Care (Signed)
   Problem: Clinical Measurements: Goal: Will remain free from infection Outcome: Progressing Goal: Diagnostic test results will improve Outcome: Progressing   Problem: Nutrition: Goal: Adequate nutrition will be maintained Outcome: Progressing

## 2024-04-24 DIAGNOSIS — J189 Pneumonia, unspecified organism: Secondary | ICD-10-CM

## 2024-04-24 LAB — BASIC METABOLIC PANEL WITH GFR
Anion gap: 9 (ref 5–15)
BUN: 8 mg/dL (ref 8–23)
CO2: 21 mmol/L — ABNORMAL LOW (ref 22–32)
Calcium: 7.9 mg/dL — ABNORMAL LOW (ref 8.9–10.3)
Chloride: 103 mmol/L (ref 98–111)
Creatinine, Ser: 0.71 mg/dL (ref 0.61–1.24)
GFR, Estimated: >60 mL/min (ref >60)
Glucose, Bld: 125 mg/dL — ABNORMAL HIGH (ref 70–99)
Potassium: 4.2 mmol/L (ref 3.5–5.1)
Sodium: 133 mmol/L — ABNORMAL LOW (ref 135–145)

## 2024-04-24 LAB — GLUCOSE, CAPILLARY
Glucose-Capillary: 83 mg/dL (ref 70–99)
Glucose-Capillary: 90 mg/dL (ref 70–99)
Glucose-Capillary: 102 mg/dL — ABNORMAL HIGH (ref 70–99)
Glucose-Capillary: 107 mg/dL — ABNORMAL HIGH (ref 70–99)

## 2024-04-24 LAB — CBC
HCT: 29.8 % — ABNORMAL LOW (ref 39.0–52.0)
Hemoglobin: 9.5 g/dL — ABNORMAL LOW (ref 13.0–17.0)
MCH: 32.1 pg (ref 26.0–34.0)
MCHC: 31.9 g/dL (ref 30.0–36.0)
MCV: 100.7 fL — ABNORMAL HIGH (ref 80.0–100.0)
Platelets: 339 K/uL (ref 150–400)
RBC: 2.96 MIL/uL — ABNORMAL LOW (ref 4.22–5.81)
RDW: 13.8 % (ref 11.5–15.5)
WBC: 10.4 K/uL (ref 4.0–10.5)
nRBC: 0 % (ref 0.0–0.2)

## 2024-04-24 LAB — MAGNESIUM: Magnesium: 1.8 mg/dL (ref 1.7–2.4)

## 2024-04-24 LAB — LACTIC ACID, PLASMA: Lactic Acid, Venous: 1.8 mmol/L (ref 0.5–1.9)

## 2024-04-24 LAB — PHOSPHORUS: Phosphorus: 4 mg/dL (ref 2.5–4.6)

## 2024-04-24 NOTE — Progress Notes (Signed)
 Physical Therapy Treatment Patient Details Name: Cody Glenn MRN: [id number] DOB: 01/25/1956 Today's Date: 04/24/2024   History of Present Illness Pt is 68 year old presented to [facility] on  04/15/24 for acute on chronic hyponatremia due to alcohol use. PMH - etoh abuse, DVT, htn, cervical stenosis, partial gastrectomy    PT Comments  Pt admitted with above diagnosis. Pt needing +2 mod assist (2nd person for safety and lines) for bed mobility and transfers. Refused to walk today with PT giving max encouragement. Pt did agree to stand to be cleaned as well as to transfer to chair.  Exercises performed once in chair.  Pt self limiting today.  Continues to need post acute rehab < 3 hours day.  Pt currently with functional limitations due to the deficits listed below (see PT Problem List). Pt will benefit from acute skilled PT to increase their independence and safety with mobility to allow discharge.       If plan is discharge home, recommend the following: A lot of help with walking and/or transfers;A lot of help with bathing/dressing/bathroom;Assistance with cooking/housework;Supervision due to cognitive status;Assist for transportation   Can travel by private vehicle     Yes  Equipment Recommendations  None recommended by PT    Recommendations for Other Services       Precautions / Restrictions Precautions Precautions: Fall Precaution/Restrictions Comments: bowel incontinence Restrictions Weight Bearing Restrictions Per Provider Order: No     Mobility  Bed Mobility Overal bed mobility: Needs Assistance Bed Mobility: Supine to Sit, Sit to Supine     Supine to sit: Mod assist, HOB elevated, Used rails     General bed mobility comments: Pt needed cues to grab R rail with R UE and push up on R elbow while pulling up on therapist with L UE to sit up R EOB, modA at trunk.  Once sitting up, pt attempting to lie down as he states he doesnt want to walk. Noted BM on pad and encouraged pt  to at least get OOB so that bed can be changed and that therapy can assist pt being cleaned. Pt agreed reluctantly.    Transfers Overall transfer level: Needs assistance Equipment used: Rollator (4 wheels) Transfers: Sit to/from Stand Sit to Stand: Mod assist   Step pivot transfers: Mod assist, +2 safety/equipment       General transfer comment: Pt needed modA to power up to stand and gain balance, transferring from EOB to rollator. Pt maintained a flexed posture and posterior lean initially, needing multi-modal cues to correct. Pt took pivotal steps to recliner from bed with rollator with mod assist, second person for safety with lines. Pt moves impulsively at times.    Ambulation/Gait               General Gait Details: Refused to ambulate   Stairs             Wheelchair Mobility     Tilt Bed    Modified Rankin (Stroke Patients Only)       Balance Overall balance assessment: Needs assistance Sitting-balance support: No upper extremity supported, Feet supported Sitting balance-Leahy Scale: Fair Sitting balance - Comments: sits statically EOB with CGA-supervision for safety   Standing balance support: Bilateral upper extremity supported, During functional activity, Reliant on assistive device for balance Standing balance-Leahy Scale: Poor Standing balance comment: reliant on UE support and up to modA, intermittent posterior lean noted  Communication Communication Communication: No apparent difficulties  Cognition Arousal: Alert Behavior During Therapy: WFL for tasks assessed/performed   PT - Cognitive impairments: No family/caregiver present to determine baseline, Memory, Safety/Judgement, Problem solving                       PT - Cognition Comments: Pt self limiting, needing encouragement to progress mobility. Following commands: Impaired Following commands impaired: Follows one step commands with  increased time    Cueing Cueing Techniques: Tactile cues, Verbal cues  Exercises General Exercises - Lower Extremity Ankle Circles/Pumps: AROM, Both, 10 reps, Supine Long Arc Quad: AROM, Both, 10 reps, Seated    General Comments General comments (skin integrity, edema, etc.): VSS      Pertinent Vitals/Pain Pain Assessment Pain Assessment: Faces Faces Pain Scale: Hurts little more Pain Location: generalized Pain Descriptors / Indicators: Discomfort, Grimacing, Guarding Pain Intervention(s): Limited activity within patient's tolerance, Monitored during session, Repositioned    Home Living                          Prior Function            PT Goals (current goals can now be found in the care plan section) Progress towards PT goals: Progressing toward goals    Frequency    Min 2X/week      PT Plan      Co-evaluation              AM-PAC PT 6 Clicks Mobility   Outcome Measure  Help needed turning from your back to your side while in a flat bed without using bedrails?: A Little Help needed moving from lying on your back to sitting on the side of a flat bed without using bedrails?: A Lot Help needed moving to and from a bed to a chair (including a wheelchair)?: A Lot Help needed standing up from a chair using your arms (e.g., wheelchair or bedside chair)?: A Lot Help needed to walk in hospital room?: Total (<20 ft) Help needed climbing 3-5 steps with a railing? : Total 6 Click Score: 11    End of Session Equipment Utilized During Treatment: Gait belt Activity Tolerance: Patient limited by fatigue (self limiting) Patient left: with call bell/phone within reach;in chair;with chair alarm set Nurse Communication: Mobility status PT Visit Diagnosis: Unsteadiness on feet (R26.81);Other abnormalities of gait and mobility (R26.89);Muscle weakness (generalized) (M62.81);Difficulty in walking, not elsewhere classified (R26.2)     Time: 9042-8979 PT Time  Calculation (min) (ACUTE ONLY): 23 min  Charges:    $Therapeutic Activity: 23-37 mins PT General Charges $$ ACUTE PT VISIT: 1 Visit                     Jayziah Bankhead M,PT Acute Rehab Services [phone]    Stephane JULIANNA Bevel 04/24/2024, 1:54 PM

## 2024-04-24 NOTE — Plan of Care (Signed)
   Problem: Safety: Goal: Ability to remain free from injury will improve Outcome: Progressing

## 2024-04-24 NOTE — Progress Notes (Signed)
 PROGRESS NOTE                                                                                                                                                                                                             Patient Demographics:    Cody Glenn, is a 68 y.o. male, DOB - 1956/04/22, FMW:[id number]  Outpatient Primary MD for the patient is [name], MD    LOS - 8  Admit date - 04/15/2024    Chief Complaint  Patient presents with   Hypoglycemia   Alcohol Intoxication       Brief Narrative (HPI from H&P)     68 y.o. year old male with past medical history of heavy alcohol use, DVT, PUD, history of partial gastrectomy, active smoking, prediabetes, primary hypertension, cervical spinal stenosis, and homelessness.  He presents to [name], ED after being found by a bystander in a parking lot.  On EMS arrival patient was hypoglycemic and was given an amp of D50.  In the ER workup consistent with hyponatremia, dehydration with multiple electrolyte abnormalities.  Chest x-ray also showed left upper lobe cavitary lesion.   Subjective:   Patient in bed, appears comfortable, denies any headache, no fever, no chest pain or pressure, no shortness of breath , no abdominal pain. No focal weakness.   Assessment  & Plan :    Severe dehydration, hyponatremia, hypomagnesemia, hypocalcemia, hypophosphatemia.  In a patient with ongoing alcohol abuse, homeless. Nephrology on board being gently hydrated, sodium gradually improving, electrolytes have been replaced and will be monitored.   Left lung cavitary lesion with high fever.  Septic.  Homeless, alcoholic, high risk for chronic aspiration, sepsis workup initiated, IV fluids, pancultured, CT chest confirms left-sided cavitary lesion questionable abscess, he has had pulmonary evaluation for the same few weeks ago and had received antibiotics at that time, currently on  Unasyn , case discussed with ID Dr. Montie Cleverly on 04/21/2024 total 2 weeks of Unasyn /Augmentin  along with Zyvox  with outpatient pulmonary and ID follow-up postdischarge.  Seen and followed by speech and pulmonary this admission.  He underwent bronchoscopy with biopsies on 04/19/2024, follow results.  Cultures thus far negative.  Clinically improving.  Per pulmonary post discharge follow-up with Dr. Dorn Chill 2 weeks postdischarge  # Shigella gastroenteritis with diarrhea. 7 days of azithromycin .  ##Protein Calorie Malnutrition  Dietitian consulted.   #Alcohol use disorder Patient reports drinking 6-8 beers daily, reports last drink was yesterday - Patient counseled on importance of cessation - CIWA protocol with PRN symptom triggered Ativan    #Tobacco Use Disorder - Patient counseled on importance of cessation - Nicotine  replacement therapy with nicotine  patch and gum while inpatient  #Normocytic Anemia Stable   #COPD Not on any outpatient maintenance inhalers -PRN Albuterol    #Hypertension - Patient reports he is not taking Diovan  - BP normotensive, monitor   #Peptic Ulcer Disease - Protonix    #Chronic Neck Pain - Lidocaine  patch - PRN toradol   #Normocytic Anemia Stable   #COPD Not on any outpatient maintenance inhalers -PRN Albuterol    #Hypertension - Patient reports he is not taking Diovan  - BP normotensive, monitor   #Peptic Ulcer Disease - Protonix    #Chronic Neck Pain - Lidocaine  patch - PRN toradol   ## Mildly elevated free T4 levels with stable TSH.  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    Lab Results  Component Value Date   HGBA1C 4.1 (L) 04/16/2024   CBG (last 3)  Recent Labs    04/23/24 1743 04/24/24 0839 04/24/24 1149   GLUCAP 89 83 90        Condition - Extremely Guarded  Family Communication  : None present  Code Status : Full code  Consults  : Pulmonary, nephrology  PUD Prophylaxis :  PPI   Procedures  :     Date:04/19/24    Provider Performing:[name]    Procedure(s):  Flexible bronchoscopy with bronchial alveolar lavage   CT chest with IV contrast ordered 04/17/2024 - 1. Increasing size of the thick walled cavitary lesion within the left upper lobe, consistent with progressive cavitary pneumonia. While the soft tissue nodule within the inferior aspect of the lesion is no longer visualized, there has been marked progression in the mural thickening seen along the inferior extent, consistent with progressive infection. There is communication between this cavitary lesion and a subsegmental left upper lobe bronchus as described above. 2. Worsening ground-glass airspace disease within the lingula and left lower lobe, compatible with worsening infection. 3. Trace bilateral pleural effusions, left greater than right. 4. Aortic Atherosclerosis (ICD10-I70.0) and Emphysema (ICD10-J43.9).       Disposition Plan  :    Status is: Inpatient  DVT Prophylaxis  :    heparin  injection 5,000 Units Start: 04/17/24 1400 Place and maintain sequential compression device Start: 04/16/24 1157    Lab Results  Component Value Date   PLT 339 04/23/2024    Diet :  Diet Order             Diet regular Room service appropriate? Yes with Assist; Fluid consistency: Thin  Diet effective now                    Inpatient Medications  Scheduled Meds:  feeding supplement  1 Container Oral TID BM   folic acid   1 mg Oral Daily   heparin  injection (subcutaneous)  5,000 Units Subcutaneous Q8H   insulin  aspart  0-9 Units Subcutaneous TID WC   lidocaine   1 patch Transdermal Q24H   linezolid   600 mg Oral Q12H   multivitamin with minerals  1  tablet Oral Daily   nicotine   14 mg Transdermal Daily    pantoprazole   40 mg Oral Daily   sodium chloride   1 g Oral TID WC   thiamine   100 mg Oral Daily   Or   thiamine   100 mg Intravenous Daily   Continuous Infusions:  ampicillin -sulbactam (UNASYN ) IV 3 g (04/24/24 1301)   PRN Meds:.acetaminophen  **OR** acetaminophen , dextrose , levalbuterol , melatonin, metoprolol  tartrate, nicotine  polacrilex, ondansetron  **OR** ondansetron  (ZOFRAN ) IV, polyethylene glycol  Antibiotics  :    Anti-infectives (From admission, onward)    Start     Dose/Rate Route Frequency Ordered Stop   04/22/24 1045  linezolid  (ZYVOX ) tablet 600 mg        600 mg Oral Every 12 hours 04/22/24 0948 05/06/24 0959   04/18/24 1100  azithromycin  (ZITHROMAX ) tablet 500 mg        500 mg Oral Daily 04/18/24 1002 04/24/24 0921   04/17/24 1200  Ampicillin -Sulbactam (UNASYN ) 3 g in sodium chloride  0.9 % 100 mL IVPB        3 g 200 mL/hr over 30 Minutes Intravenous Every 6 hours 04/17/24 1025 05/06/24 2359         Objective:   Vitals:   04/24/24 0700 04/24/24 0800 04/24/24 0934 04/24/24 1100  BP: 120/86 120/86  (!) 124/99  Pulse:   91   Resp:  20 20   Temp: 97.8 F (36.6 C)   98 F (36.7 C)  TempSrc: Oral   Oral  SpO2:   97%   Weight:      Height:        Wt Readings from Last 3 Encounters:  04/19/24 68 kg  03/18/24 69 kg  02/20/24 69 kg    No intake or output data in the 24 hours ending 04/24/24 1345    Physical Exam  Awake Alert, frail, deconditioned Symmetrical Chest wall movement, Good air movement bilaterally, coarse respiratory sounds RRR,No Gallops,Rubs or new Murmurs, No Parasternal Heave +ve B.Sounds, Abd Soft, No tenderness, No rebound - guarding or rigidity. No Cyanosis, Clubbing or edema, No new Rash or bruise          Data Review:    Recent Labs  Lab 04/18/24 0253 04/19/24 0340 04/20/24 0400 04/21/24 0421 04/22/24 0445 04/23/24 2358  WBC 15.2* 11.4* 7.5 8.9 8.1 10.4  HGB 8.4* 7.9* 9.0* 8.7* 8.5* 9.5*  HCT 23.2* 22.7* 26.8* 26.0*  25.4* 29.8*  PLT 202 219 227 257 276 339  MCV 95.1 97.0 98.9 99.6 98.4 100.7*  MCH 34.4* 33.8 33.2 33.3 32.9 32.1  MCHC 36.2* 34.8 33.6 33.5 33.5 31.9  RDW 13.2 13.7 13.5 13.6 13.5 13.8  LYMPHSABS 1.3 1.6 1.0 2.2 2.1  --   MONOABS 1.2* 0.9 0.5 0.6 0.8  --   EOSABS 0.0 0.1 0.0 0.3 0.4  --   BASOSABS 0.1 0.1 0.0 0.1 0.1  --     Recent Labs  Lab 04/18/24 0253 04/19/24 0340 04/20/24 0400 04/21/24 0421 04/22/24 0445 04/23/24 2358  NA 130* 130* 130* 133* 134* 133*  K 3.3* 3.7 4.6 4.7 4.2 4.2  CL 100 105 102 101 103 103  CO2 22 20* 18* 22 19* 21*  ANIONGAP 8 5 10 10 12 9   GLUCOSE 101* 120* 145* 85 84 125*  BUN 5* 5* 8 10 8 8   CREATININE 0.56* 0.50* 0.55* 0.55* 0.51* 0.71  AST 22 21 22 31 26   --   ALT 13 14 14 20 20   --   [facility]  58 54 61 59 53  --   BILITOT 0.8 0.3 0.4 0.4 0.4  --   ALBUMIN 1.7* 1.6* 1.8* 1.8* 1.6*  --   CRP 12.6* 13.2* 8.7* 4.6*  --   --   PROCALCITON 6.72 2.11 0.73 0.40 0.12  --   LATICACIDVEN  --   --   --   --   --  1.8  AMMONIA 27 31 18  <13 24  --   MG 1.8 1.5* 1.9 1.7  --  1.8  PHOS 2.9 2.0* 3.5 3.4  --  4.0  CALCIUM  7.7* 7.6* 8.1* 8.1* 7.9* 7.9*      Recent Labs  Lab 04/18/24 0253 04/19/24 0340 04/20/24 0400 04/21/24 0421 04/22/24 0445 04/23/24 2358  CRP 12.6* 13.2* 8.7* 4.6*  --   --   PROCALCITON 6.72 2.11 0.73 0.40 0.12  --   LATICACIDVEN  --   --   --   --   --  1.8  AMMONIA 27 31 18  <13 24  --   MG 1.8 1.5* 1.9 1.7  --  1.8  CALCIUM  7.7* 7.6* 8.1* 8.1* 7.9* 7.9*    Lab Results  Component Value Date   CHOL 213 (H) 10/13/2020   HDL 106 10/13/2020   LDLCALC 93 10/13/2020   TRIG 84 10/13/2020   CHOLHDL 2.0 10/13/2020    Lab Results  Component Value Date   HGBA1C 4.1 (L) 04/16/2024   No results for input(s): TSH, T4TOTAL, FREET4, T3FREE, THYROIDAB in the last 72 hours.  No results for input(s): VITAMINB12, FOLATE, FERRITIN, TIBC, IRON, RETICCTPCT in the last 72  hours. ------------------------------------------------------------------------------------------------------------------ Cardiac Enzymes No results for input(s): CKMB, TROPONINI, MYOGLOBIN in the last 168 hours.  Invalid input(s): CK  Micro Results Recent Results (from the past 240 hours)  Respiratory (~20 pathogens) panel by PCR     Status: None   Collection Time: 04/17/24 10:02 AM   Specimen: Nasopharyngeal Swab; Respiratory  Result Value Ref Range Status   Adenovirus NOT DETECTED NOT DETECTED Final   Coronavirus 229E NOT DETECTED NOT DETECTED Final    Comment: (NOTE) The Coronavirus on the Respiratory Panel, DOES NOT test for the novel  Coronavirus (2019 nCoV)    Coronavirus HKU1 NOT DETECTED NOT DETECTED Final   Coronavirus NL63 NOT DETECTED NOT DETECTED Final   Coronavirus OC43 NOT DETECTED NOT DETECTED Final   Metapneumovirus NOT DETECTED NOT DETECTED Final   Rhinovirus / Enterovirus NOT DETECTED NOT DETECTED Final   Influenza A NOT DETECTED NOT DETECTED Final   Influenza B NOT DETECTED NOT DETECTED Final   Parainfluenza Virus 1 NOT DETECTED NOT DETECTED Final   Parainfluenza Virus 2 NOT DETECTED NOT DETECTED Final   Parainfluenza Virus 3 NOT DETECTED NOT DETECTED Final   Parainfluenza Virus 4 NOT DETECTED NOT DETECTED Final   Respiratory Syncytial Virus NOT DETECTED NOT DETECTED Final   Bordetella pertussis NOT DETECTED NOT DETECTED Final   Bordetella Parapertussis NOT DETECTED NOT DETECTED Final   Chlamydophila pneumoniae NOT DETECTED NOT DETECTED Final   Mycoplasma pneumoniae NOT DETECTED NOT DETECTED Final    Comment: Performed at [facility], [address] [place], [place] [address]  SARS Coronavirus 2 by RT PCR (hospital order, performed in [facility] hospital lab) *cepheid single result test* Nasal Mucosa     Status: None   Collection Time: 04/17/24 10:02 AM   Specimen: Nasal Mucosa; Nasal Swab  Result Value Ref Range Status   SARS Coronavirus 2  by RT PCR  NEGATIVE NEGATIVE Final    Comment: Performed at [facility], [address], [place], [place] [address]  Culture, blood (Routine X 2) w Reflex to ID Panel     Status: None   Collection Time: 04/17/24 10:55 AM   Specimen: BLOOD LEFT ARM  Result Value Ref Range Status   Specimen Description BLOOD LEFT ARM  Final   Special Requests   Final    BOTTLES DRAWN AEROBIC AND ANAEROBIC Blood Culture results may not be optimal due to an inadequate volume of blood received in culture bottles   Culture   Final    NO GROWTH 5 DAYS Performed at [facility], [address] [place], [place] [address]    Report Status 04/22/2024 FINAL  Final  Culture, blood (Routine X 2) w Reflex to ID Panel     Status: None   Collection Time: 04/17/24 10:59 AM   Specimen: BLOOD  Result Value Ref Range Status   Specimen Description BLOOD SITE NOT SPECIFIED  Final   Special Requests   Final    BOTTLES DRAWN AEROBIC AND ANAEROBIC Blood Culture results may not be optimal due to an inadequate volume of blood received in culture bottles   Culture   Final    NO GROWTH 5 DAYS Performed at [facility], [address] [place], [place] [address]    Report Status 04/22/2024 FINAL  Final  Gastrointestinal Panel by PCR , Stool     Status: Abnormal   Collection Time: 04/18/24 12:38 AM   Specimen: Stool  Result Value Ref Range Status   Campylobacter species NOT DETECTED NOT DETECTED Final   Plesimonas shigelloides NOT DETECTED NOT DETECTED Final   Salmonella species NOT DETECTED NOT DETECTED Final   Yersinia enterocolitica NOT DETECTED NOT DETECTED Final   Vibrio species NOT DETECTED NOT DETECTED Final   Vibrio cholerae NOT DETECTED NOT DETECTED Final   Enteroaggregative E coli (EAEC) NOT DETECTED NOT DETECTED Final   Enteropathogenic E coli (EPEC) DETECTED (A) NOT DETECTED Final    Comment: RESULT CALLED TO, READ BACK BY AND VERIFIED WITH: KEDRIC DARING RN [phone] 04/18/24 HNM    Enterotoxigenic  E coli (ETEC) NOT DETECTED NOT DETECTED Final   Shiga like toxin producing E coli (STEC) NOT DETECTED NOT DETECTED Final   Shigella/Enteroinvasive E coli (EIEC) DETECTED (A) NOT DETECTED Final    Comment: RESULT CALLED TO, READ BACK BY AND VERIFIED WITH: KEDRIC DARING RN [phone] 04/18/24 HNM    Cryptosporidium NOT DETECTED NOT DETECTED Final   Cyclospora cayetanensis NOT DETECTED NOT DETECTED Final   Entamoeba histolytica NOT DETECTED NOT DETECTED Final   Giardia lamblia NOT DETECTED NOT DETECTED Final   Adenovirus F40/41 NOT DETECTED NOT DETECTED Final   Astrovirus NOT DETECTED NOT DETECTED Final   Norovirus GI/GII NOT DETECTED NOT DETECTED Final   Rotavirus A NOT DETECTED NOT DETECTED Final   Sapovirus (I, II, IV, and V) NOT DETECTED NOT DETECTED Final    Comment: Performed at [facility], [address] [place], [place] [address]  MRSA Next Gen by PCR, Nasal     Status: None   Collection Time: 04/18/24  5:45 AM   Specimen: Nasal Mucosa; Nasal Swab  Result Value Ref Range Status   MRSA by PCR Next Gen NOT DETECTED NOT DETECTED Final    Comment: (NOTE) The GeneXpert MRSA Assay (FDA approved for NASAL specimens only), is one component of a comprehensive MRSA colonization surveillance program. It is  not intended to diagnose MRSA infection nor to guide or monitor treatment for MRSA infections. Test performance is not FDA approved in patients less than 75 years old. Performed at [facility], [address] [place], [place] [address]   Culture, BAL-quantitative w Gram Stain     Status: Abnormal   Collection Time: 04/19/24 12:38 PM   Specimen: Bronchial Alveolar Lavage; Respiratory  Result Value Ref Range Status   Specimen Description BRONCHIAL ALVEOLAR LAVAGE  Final   Special Requests NONE  Final   Gram Stain   Final    NO WBC SEEN NO ORGANISMS SEEN Performed at [facility], [address] [place], [place] [address]    Culture (A)  Final    2,000  COLONIES/mL ACINETOBACTER CALCOACETICUS/BAUMANNII COMPLEX   Report Status 04/24/2024 FINAL  Final   Organism ID, Bacteria ACINETOBACTER CALCOACETICUS/BAUMANNII COMPLEX (A)  Final      Susceptibility   Acinetobacter calcoaceticus/baumannii complex - MIC*    PIP/TAZO Value in next row Resistant ug/mL     >=128 RESISTANTThis is a modified FDA-approved test that has been validated and its performance characteristics determined by the reporting laboratory.  This laboratory is certified under the Clinical Laboratory Improvement Amendments CLIA as qualified to perform high complexity clinical laboratory testing.    AMPICILLIN /SULBACTAM Value in next row Sensitive      >=128 RESISTANTThis is a modified FDA-approved test that has been validated and its performance characteristics determined by the reporting laboratory.  This laboratory is certified under the Clinical Laboratory Improvement Amendments CLIA as qualified to perform high complexity clinical laboratory testing.    MEROPENEM Value in next row Sensitive      >=128 RESISTANTThis is a modified FDA-approved test that has been validated and its performance characteristics determined by the reporting laboratory.  This laboratory is certified under the Clinical Laboratory Improvement Amendments CLIA as qualified to perform high complexity clinical laboratory testing.    * 2,000 COLONIES/mL ACINETOBACTER CALCOACETICUS/BAUMANNII COMPLEX  Acid Fast Smear (AFB)     Status: None   Collection Time: 04/19/24 12:38 PM   Specimen: Bronchial Alveolar Lavage; Respiratory  Result Value Ref Range Status   AFB Specimen Processing Concentration  Final   Acid Fast Smear Negative  Final    Comment: (NOTE) Performed At: [facility] [address] [place], [place] [id number] Jennette Shorter MD Ey:[id number]    Source (AFB) BRONCHIAL ALVEOLAR LAVAGE  Final    Comment: Performed at [facility], [address], [place], [place] [address]    Radiology  Report DG CHEST PORT 1 VIEW Result Date: 04/23/2024 CLINICAL DATA:  Shortness of breath EXAM: PORTABLE CHEST 1 VIEW COMPARISON:  Film from earlier in the same day FINDINGS: Cardiac shadow is prominent but stable. Lungs are well aerated bilaterally. Increasing interstitial and alveolar opacities are noted consistent with worsening edema. Increasing effusions are noted particularly on the right. No bony abnormality is noted. IMPRESSION: Increasing CHF Electronically Signed   By: Oneil Devonshire M.D.   On: 04/23/2024 23:05   DG Chest Port 1 View Result Date: 04/23/2024 CLINICAL DATA:  Shortness of breath. EXAM: PORTABLE CHEST 1 VIEW COMPARISON:  04/17/2024 FINDINGS: Interval progression of diffuse interstitial and patchy alveolar opacity. Pleuroparenchymal scarring in the left upper lobe is similar to prior. Pleural thickening versus small pleural effusion seen at the left base. The cardio pericardial silhouette is enlarged. No acute bony abnormality. Telemetry leads overlie the chest. IMPRESSION: Interval progression of diffuse interstitial and  patchy alveolar opacity. Imaging features compatible with pulmonary edema or diffuse infection. Electronically Signed   By: Camellia Candle M.D.   On: 04/23/2024 06:52      Signature  -   Brayton Lye M.D on 04/24/2024 at 1:45 PM   -  To page go to [email or web address]

## 2024-04-24 NOTE — Plan of Care (Signed)
   Problem: Clinical Measurements: Goal: Will remain free from infection Outcome: Progressing Goal: Diagnostic test results will improve Outcome: Progressing   Problem: Activity: Goal: Risk for activity intolerance will decrease Outcome: Progressing

## 2024-04-24 NOTE — TOC Progression Note (Addendum)
 Transition of Care [facility]) - Progression Note    Patient Details  Name: Cody Glenn MRN: [id number] Date of Birth: 08/25/1955  [facility] of [facility] CM/SW Contact  Inocente GORMAN Kindle, LCSW Phone Number: 04/24/2024, 9:03 AM  Clinical Narrative:    9am-CSW requested [facility] review patient as their buildings are in network.  Discussed with [facility] and they reported if all denials are obtained they can attempt to get a one time contract with [facility] at one of the [facility] or [facility] buildings since they are not in network with [employer].   12:01 PM-CSW and MSW Intern met with patient and made him aware that [facility] is the only SNF in network that can accept him. Patient reported agreement with [facility].  CSW requested facility begin insurance authorization process.   Patient reported that he will have somewhere to stay after rehab as he receives an income. He reported that per policy, he is not able to return to [facility] for 6 months. He reported his income is less than $2000/month.      Expected Discharge Plan: Skilled Nursing Facility Barriers to Discharge: Continued Medical Work up, SNF Pending bed offer, Insurance Authorization               Expected Discharge Plan and Services In-house Referral: Clinical Social Work     Living arrangements for the past 2 months: Homeless                                       Social Drivers of Health (SDOH) Interventions SDOH Screenings   Food Insecurity: Food Insecurity Present (04/17/2024)  Housing: High Risk (04/17/2024)  Transportation Needs: Unmet Transportation Needs (04/17/2024)  Utilities: Not At Risk (04/17/2024)  Depression (PHQ2-9): Low Risk  (10/13/2020)  Social Connections: Moderately Isolated (04/17/2024)  Tobacco Use: High Risk (04/15/2024)    Readmission Risk Interventions    02/22/2024    2:07 PM  Readmission Risk Prevention Plan  Post Dischage Appt Complete   Medication Screening Complete  Transportation Screening Complete

## 2024-04-25 DIAGNOSIS — F1010 Alcohol abuse, uncomplicated: Secondary | ICD-10-CM | POA: Diagnosis not present

## 2024-04-25 LAB — GLUCOSE, CAPILLARY
Glucose-Capillary: 87 mg/dL (ref 70–99)
Glucose-Capillary: 159 mg/dL — ABNORMAL HIGH (ref 70–99)
Glucose-Capillary: 80 mg/dL (ref 70–99)

## 2024-04-25 MED ORDER — QUETIAPINE FUMARATE 50 MG PO TABS
50.0000 mg | ORAL_TABLET | Freq: Every day | ORAL | Status: DC
  Administered 2024-04-26 (×2): 50 mg via ORAL
  Filled 2024-04-25 (×2): qty 1

## 2024-04-25 MED ORDER — LORAZEPAM 1 MG PO TABS
1.0000 mg | ORAL_TABLET | ORAL | Status: DC | PRN
  Administered 2024-04-26: 2 mg via ORAL
  Filled 2024-04-25: qty 2

## 2024-04-25 MED ORDER — LORAZEPAM 2 MG/ML IJ SOLN
1.0000 mg | INTRAMUSCULAR | Status: DC | PRN
  Administered 2024-04-25: 1 mg via INTRAMUSCULAR
  Filled 2024-04-25 (×2): qty 1

## 2024-04-25 MED ORDER — LORAZEPAM 2 MG/ML IJ SOLN
1.0000 mg | INTRAMUSCULAR | Status: DC | PRN
  Administered 2024-04-26 – 2024-04-27 (×2): 1 mg via INTRAVENOUS
  Filled 2024-04-25: qty 1

## 2024-04-25 MED ORDER — HALOPERIDOL LACTATE 5 MG/ML IJ SOLN
5.0000 mg | Freq: Four times a day (QID) | INTRAMUSCULAR | Status: DC | PRN
  Administered 2024-04-25: 5 mg via INTRAMUSCULAR

## 2024-04-25 MED ORDER — HALOPERIDOL LACTATE 5 MG/ML IJ SOLN
INTRAMUSCULAR | Status: AC
  Administered 2024-04-25: 5 mg via INTRAMUSCULAR
  Filled 2024-04-25: qty 1

## 2024-04-25 NOTE — Progress Notes (Signed)
 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 PMH - etoh abuse, DVT, htn, cervical stenosis, partial gastrectomy   OT comments  Pt found on floor, nursing staff in room. Assisted Pt to EOB, Pt confused, agitated, refuses to participate or answer questions, difficulty with commands. Pt stood up with mod A x2 support using RW, poor balance, total assist for perineal hygiene. Pt refused to lie down in bed or pivot to recliner. Pt asked for shoes but unable to don them safely. When asking Pt to take them off Pt got agitated and refused. Pt states he wants to walk outside but not aware of current deficits. Pt repeatedly asked to be left alone, RN supervising Pt in room upon leaving. Pt needs 24/7 supervision for safety, DC plan still appropriate, will continue to see acutely to progress as able.       If plan is discharge home, recommend the following:  A lot of help with walking and/or transfers;A lot of help with bathing/dressing/bathroom;Assistance with cooking/housework;Direct supervision/assist for medications management;Direct supervision/assist for financial management;Assist for transportation;Help with stairs or ramp for entrance   Equipment Recommendations  Other (comment) (defer)    Recommendations for Other Services      Precautions / Restrictions Precautions Precautions: Fall Recall of Precautions/Restrictions: Impaired Precaution/Restrictions Comments: bowel incontinence, found on floor 04/25/24 Restrictions Weight Bearing Restrictions Per Provider Order: No       Mobility Bed Mobility Overal bed mobility: Needs Assistance             General bed mobility comments: sitting EOB    Transfers Overall transfer level: Needs assistance Equipment used: Rolling  walker (2 wheels) Transfers: Sit to/from Stand Sit to Stand: Mod assist, +2 physical assistance           General transfer comment: mod A x2 for safety for STS     Balance Overall balance assessment: Needs assistance Sitting-balance support: No upper extremity supported, Feet supported Sitting balance-Leahy Scale: Fair     Standing balance support: Bilateral upper extremity supported, During functional activity Standing balance-Leahy Scale: Poor Standing balance comment: reliant on RW, external support to maintain balance                           ADL either performed or assessed with clinical judgement   ADL Overall ADL's : Needs assistance/impaired                         Toilet Transfer: Moderate assistance;+2 for physical assistance;Rolling walker (2 wheels)   Toileting- Clothing Manipulation and Hygiene: Total assistance;+2 for physical assistance;Sitting/lateral lean;Sit to/from stand         General ADL Comments: Pt confused, stands with RW mod Ax2 support for perineal hygiene. Pt refused further ADLs    Extremity/Trunk Assessment Upper Extremity Assessment Upper Extremity Assessment: Difficult to assess due to impaired cognition;LUE deficits/detail LUE Deficits / Details: Pt refused to answer questions or follow instructions, Pt rests with L wrist drop, slightly swollen, difficult to assist if Pt can complete tasks due to impaired cognition and agitation.            Vision       Restaurant manager, fast food  Communication: No apparent difficulties   Cognition Arousal: Alert Behavior During Therapy: Agitated, Impulsive Cognition: Cognition impaired           Executive functioning impairment (select all impairments): Problem solving, Reasoning, Sequencing OT - Cognition Comments: Pt not oriented, agitated, found on floor and wanted to stay sitting on floor. Assisted to EOB, Pt refuses to get to recliner  or to lie down. Pt refuses to answer questions or complete tasks.                 Following commands: Impaired Following commands impaired: Follows one step commands inconsistently      Cueing   Cueing Techniques: Verbal cues, Gestural cues, Tactile cues, Visual cues  Exercises      Shoulder Instructions       General Comments      Pertinent Vitals/ Pain       Pain Assessment Pain Assessment: No/denies pain  Home Living                                          Prior Functioning/Environment              Frequency  Min 2X/week        Progress Toward Goals  OT Goals(current goals can now be found in the care plan section)  Progress towards OT goals: Progressing toward goals  Acute Rehab OT Goals Patient Stated Goal: not able to participate in setting goals OT Goal Formulation: Patient unable to participate in goal setting Time For Goal Achievement: 05/02/24 Potential to Achieve Goals: Good ADL Goals Pt Will Perform Grooming: with supervision;standing Pt Will Perform Lower Body Bathing: with min assist;sit to/from stand Pt Will Perform Lower Body Dressing: with min assist;sit to/from stand Pt Will Transfer to Toilet: with contact guard assist;ambulating;regular height toilet Pt Will Perform Toileting - Clothing Manipulation and hygiene: with min assist;sit to/from stand Pt/caregiver will Perform Home Exercise Program: Increased ROM;Increased strength;Left upper extremity;With Supervision;With written HEP provided  Plan      Co-evaluation                 AM-PAC OT 6 Clicks Daily Activity     Outcome Measure   Help from another person eating meals?: A Little Help from another person taking care of personal grooming?: A Little Help from another person toileting, which includes using toliet, bedpan, or urinal?: Total Help from another person bathing (including washing, rinsing, drying)?: A Lot Help from another person to put  on and taking off regular upper body clothing?: A Little Help from another person to put on and taking off regular lower body clothing?: A Lot 6 Click Score: 14    End of Session Equipment Utilized During Treatment: Rolling walker (2 wheels)  OT Visit Diagnosis: Unsteadiness on feet (R26.81);Other abnormalities of gait and mobility (R26.89);History of falling (Z91.81);Other symptoms and signs involving cognitive function;Muscle weakness (generalized) (M62.81)   Activity Tolerance Treatment limited secondary to agitation   Patient Left in bed;with call bell/phone within reach;with nursing/sitter in room   Nurse Communication Mobility status        Time: 8399-8371 OT Time Calculation (min): 28 min  Charges: OT General Charges $OT Visit: 1 Visit OT Treatments $Self Care/Home Management : 8-22 mins $Therapeutic Activity: 8-22 mins  Hanley Rispoli, OTR/L   Elouise JONELLE Bott 04/25/2024, 4:38 PM

## 2024-04-25 NOTE — TOC Progression Note (Addendum)
 Transition of Care [facility]) - Progression Note    Patient Details  Name: Cody Glenn MRN: [id number] Date of Birth: April 06, 1956  [facility] CM/SW Contact  Inocente GORMAN Kindle, LCSW Phone Number: 04/25/2024, 9:09 AM  Clinical Narrative:    [facility] awaiting insurance approval. Updated them that patient will require IV antibiotics at discharge.    Expected Discharge Plan: Skilled Nursing Facility Barriers to Discharge: Continued Medical Work up, SNF Pending bed offer, Insurance Authorization               Expected Discharge Plan and Services In-house Referral: Clinical Social Work     Living arrangements for the past 2 months: Homeless                                       Social Drivers of Health (SDOH) Interventions SDOH Screenings   Food Insecurity: Food Insecurity Present (04/17/2024)  Housing: High Risk (04/17/2024)  Transportation Needs: Unmet Transportation Needs (04/17/2024)  Utilities: Not At Risk (04/17/2024)  Depression (PHQ2-9): Low Risk  (10/13/2020)  Social Connections: Moderately Isolated (04/17/2024)  Tobacco Use: High Risk (04/15/2024)    Readmission Risk Interventions    02/22/2024    2:07 PM  Readmission Risk Prevention Plan  Post Dischage Appt Complete  Medication Screening Complete  Transportation Screening Complete

## 2024-04-25 NOTE — Progress Notes (Signed)
   04/25/24 1900  What Happened  Was fall witnessed? No  Was patient injured? No  Patient found on floor  Found by Staff-comment Emaline, RN)  Stated prior activity to/from bed, chair, or stretcher  Provider Notification  Provider Name/Title [place], RN  Date Provider Notified 04/25/24  Time Provider Notified 1715  Method of Notification Call  Notification Reason Fall  Provider response In department  Date of Provider Response 04/25/24  Time of Provider Response 1715  Follow Up  Family notified No - patient refusal  Additional tests No  Progress note created (see row info) Yes  Vitals  Temp 97.8 F (36.6 C)  BP (!) 126/97  BP Location Right Arm  BP Method Automatic  Patient Position (if appropriate) Lying  Pulse Rate 97  Pulse Rate Source Monitor  ECG Heart Rate 98  Cardiac Rhythm NSR  Resp 18  Oxygen Therapy  SpO2 97 %  O2 Device Room Air  O2 Flow Rate (L/min) 0 L/min  Pain Assessment  Pain Scale 0-10  Pain Score 0  PCA/Epidural/Spinal Assessment  Respiratory Pattern Regular  Neurological  Neuro (WDL) X  Level of Consciousness Alert

## 2024-04-25 NOTE — Progress Notes (Signed)
 PROGRESS NOTE                                                                                                                                                                                                             Patient Demographics:    Cody Glenn, is a 68 y.o. male, DOB - February 01, 1956, FMW:[id number]  Outpatient Primary MD for the patient is Cody Belvie BRAVO, MD    LOS - 9  Admit date - 04/15/2024    Chief Complaint  Patient presents with   Hypoglycemia   Alcohol Intoxication       Brief Narrative (HPI from H&P)     68 y.o. year old male with past medical history of heavy alcohol use, DVT, PUD, history of partial gastrectomy, active smoking, prediabetes, primary hypertension, cervical spinal stenosis, and homelessness.  He presents to [name], ED after being found by a bystander in a parking lot.  On EMS arrival patient was hypoglycemic and was given an amp of D50.  In the ER workup consistent with hyponatremia, dehydration with multiple electrolyte abnormalities.  Chest x-ray also showed left upper lobe cavitary lesion.   Subjective:   Patient in bed, appears comfortable, denies any headache, no fever, no chest pain or pressure, no shortness of breath.    Assessment  & Plan :    Severe dehydration, hyponatremia, hypomagnesemia, hypocalcemia, hypophosphatemia.  In a patient with ongoing alcohol abuse, homeless. Nephrology on board being gently hydrated, sodium gradually improving, electrolytes have been replaced and will be monitored.   Left lung cavitary lesion with high fever.  Septic.  Homeless, alcoholic, high risk for chronic aspiration, sepsis workup initiated, IV fluids, pancultured, CT chest confirms left-sided cavitary lesion questionable abscess, he has had pulmonary evaluation for the same few weeks ago and had received antibiotics at that time, currently on Unasyn , case discussed with ID, his BAL  cultures finally came back yesterday ACINETOBACTER CALCOACETICUS/BAUMANNII COMPLEX recommendations for 2 weeks of IV Unasyn , started 9/10, stop date 9/24.   # Shigella gastroenteritis with diarrhea. 7 days of azithromycin .  ##Protein Calorie Malnutrition  Dietitian consulted.   #Alcohol use disorder Patient reports drinking 6-8 beers daily, reports last drink was yesterday - Patient counseled on importance of cessation - CIWA protocol with PRN symptom triggered Ativan    #Tobacco Use Disorder - Patient counseled on importance  of cessation - Nicotine  replacement therapy with nicotine  patch and gum while inpatient  #Normocytic Anemia Stable   #COPD Not on any outpatient maintenance inhalers -PRN Albuterol    #Hypertension - Patient reports he is not taking Diovan  - BP normotensive, monitor   #Peptic Ulcer Disease - Protonix    #Chronic Neck Pain - Lidocaine  patch - PRN toradol   #Normocytic Anemia Stable   #COPD Not on any outpatient maintenance inhalers -PRN Albuterol    #Hypertension - Patient reports he is not taking Diovan  - BP normotensive, monitor   #Peptic Ulcer Disease - Protonix    #Chronic Neck Pain - Lidocaine  patch - PRN toradol   ## Mildly elevated free T4 levels with stable TSH.  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    Lab Results  Component Value Date   HGBA1C 4.1 (L) 04/16/2024   CBG (last 3)  Recent Labs    04/24/24 2211 04/25/24 0733 04/25/24 1130  GLUCAP 107* 87 159*        Condition - Extremely Guarded  Family Communication  : None present  Code Status : Full code  Consults  : Pulmonary, nephrology  PUD Prophylaxis :  PPI   Procedures  :     Date:04/19/24    Provider Performing:[name]     Procedure(s):  Flexible bronchoscopy with bronchial alveolar lavage   CT chest with IV contrast ordered 04/17/2024 - 1. Increasing size of the thick walled cavitary lesion within the left upper lobe, consistent with progressive cavitary pneumonia. While the soft tissue nodule within the inferior aspect of the lesion is no longer visualized, there has been marked progression in the mural thickening seen along the inferior extent, consistent with progressive infection. There is communication between this cavitary lesion and a subsegmental left upper lobe bronchus as described above. 2. Worsening ground-glass airspace disease within the lingula and left lower lobe, compatible with worsening infection. 3. Trace bilateral pleural effusions, left greater than right. 4. Aortic Atherosclerosis (ICD10-I70.0) and Emphysema (ICD10-J43.9).       Disposition Plan  :    Status is: Inpatient  DVT Prophylaxis  :    heparin  injection 5,000 Units Start: 04/17/24 1400 Place and maintain sequential compression device Start: 04/16/24 1157    Lab Results  Component Value Date   PLT 339 04/23/2024    Diet :  Diet Order             Diet regular Room service appropriate? Yes with Assist; Fluid consistency: Thin  Diet effective now                    Inpatient Medications  Scheduled Meds:  feeding supplement  1 Container Oral TID BM   folic acid   1 mg Oral Daily   heparin  injection (subcutaneous)  5,000 Units Subcutaneous Q8H   insulin  aspart  0-9 Units Subcutaneous TID WC   lidocaine   1 patch Transdermal Q24H   multivitamin with minerals  1 tablet Oral Daily   nicotine   14 mg Transdermal Daily   pantoprazole   40 mg Oral Daily   sodium chloride   1 g Oral TID WC   thiamine   100 mg Oral Daily   Or   thiamine   100 mg Intravenous Daily   Continuous  Infusions:  ampicillin -sulbactam (UNASYN ) IV 3 g (04/25/24 1319)   PRN Meds:.acetaminophen  **OR** acetaminophen , dextrose , levalbuterol ,  LORazepam , LORazepam  **OR** LORazepam , melatonin, metoprolol  tartrate, nicotine  polacrilex, ondansetron  **OR** ondansetron  (ZOFRAN ) IV, polyethylene glycol  Antibiotics  :    Anti-infectives (From admission, onward)    Start     Dose/Rate Route Frequency Ordered Stop   04/22/24 1045  linezolid  (ZYVOX ) tablet 600 mg  Status:  Discontinued        600 mg Oral Every 12 hours 04/22/24 0948 04/25/24 0947   04/18/24 1100  azithromycin  (ZITHROMAX ) tablet 500 mg        500 mg Oral Daily 04/18/24 1002 04/24/24 0921   04/17/24 1200  Ampicillin -Sulbactam (UNASYN ) 3 g in sodium chloride  0.9 % 100 mL IVPB        3 g 200 mL/hr over 30 Minutes Intravenous Every 6 hours 04/17/24 1025 05/01/24 2359         Objective:   Vitals:   04/25/24 0300 04/25/24 0400 04/25/24 0700 04/25/24 1100  BP: (!) 120/90 117/87 (!) 121/95 (!) 126/97  Pulse: 93 90 91   Resp: 20 (!) 21 (!) 25   Temp: (!) 97.1 F (36.2 C)  (!) 97.5 F (36.4 C) 97.8 F (36.6 C)  TempSrc: Axillary  Oral Oral  SpO2: 97% 98% 97%   Weight:      Height:        Wt Readings from Last 3 Encounters:  04/19/24 68 kg  03/18/24 69 kg  02/20/24 69 kg     Intake/Output Summary (Last 24 hours) at 04/25/2024 1651 Last data filed at 04/25/2024 0622 Gross per 24 hour  Intake 1599.93 ml  Output 300 ml  Net 1299.93 ml      Physical Exam  Awake Alert, confused, extremely frail and deconditioned Symmetrical Chest wall movement, Good air movement bilaterally, CTAB RRR,No Gallops,Rubs or new Murmurs, No Parasternal Heave +ve B.Sounds, Abd Soft, No tenderness, No rebound - guarding or rigidity. No Cyanosis, Clubbing or edema, No new Rash or bruise           Data Review:    Recent Labs  Lab 04/19/24 0340 04/20/24 0400 04/21/24 0421 04/22/24 0445 04/23/24 2358  WBC 11.4* 7.5 8.9 8.1 10.4  HGB 7.9* 9.0* 8.7* 8.5* 9.5*  HCT 22.7* 26.8* 26.0* 25.4* 29.8*  PLT 219 227 257 276 339  MCV 97.0 98.9 99.6 98.4 100.7*  MCH 33.8 33.2 33.3  32.9 32.1  MCHC 34.8 33.6 33.5 33.5 31.9  RDW 13.7 13.5 13.6 13.5 13.8  LYMPHSABS 1.6 1.0 2.2 2.1  --   MONOABS 0.9 0.5 0.6 0.8  --   EOSABS 0.1 0.0 0.3 0.4  --   BASOSABS 0.1 0.0 0.1 0.1  --     Recent Labs  Lab 04/19/24 0340 04/20/24 0400 04/21/24 0421 04/22/24 0445 04/23/24 2358  NA 130* 130* 133* 134* 133*  K 3.7 4.6 4.7 4.2 4.2  CL 105 102 101 103 103  CO2 20* 18* 22 19* 21*  ANIONGAP 5 10 10 12 9   GLUCOSE 120* 145* 85 84 125*  BUN 5* 8 10 8 8   CREATININE 0.50* 0.55* 0.55* 0.51* 0.71  AST 21 22 31 26   --   ALT 14 14 20 20   --   ALKPHOS 54 61 59 53  --   BILITOT 0.3 0.4 0.4 0.4  --   ALBUMIN 1.6* 1.8* 1.8* 1.6*  --   CRP 13.2* 8.7* 4.6*  --   --  PROCALCITON 2.11 0.73 0.40 0.12  --   LATICACIDVEN  --   --   --   --  1.8  AMMONIA 31 18 <13 24  --   MG 1.5* 1.9 1.7  --  1.8  PHOS 2.0* 3.5 3.4  --  4.0  CALCIUM  7.6* 8.1* 8.1* 7.9* 7.9*      Recent Labs  Lab 04/19/24 0340 04/20/24 0400 04/21/24 0421 04/22/24 0445 04/23/24 2358  CRP 13.2* 8.7* 4.6*  --   --   PROCALCITON 2.11 0.73 0.40 0.12  --   LATICACIDVEN  --   --   --   --  1.8  AMMONIA 31 18 <13 24  --   MG 1.5* 1.9 1.7  --  1.8  CALCIUM  7.6* 8.1* 8.1* 7.9* 7.9*    Lab Results  Component Value Date   CHOL 213 (H) 10/13/2020   HDL 106 10/13/2020   LDLCALC 93 10/13/2020   TRIG 84 10/13/2020   CHOLHDL 2.0 10/13/2020    Lab Results  Component Value Date   HGBA1C 4.1 (L) 04/16/2024   No results for input(s): TSH, T4TOTAL, FREET4, T3FREE, THYROIDAB in the last 72 hours.  No results for input(s): VITAMINB12, FOLATE, FERRITIN, TIBC, IRON, RETICCTPCT in the last 72 hours. ------------------------------------------------------------------------------------------------------------------ Cardiac Enzymes No results for input(s): CKMB, TROPONINI, MYOGLOBIN in the last 168 hours.  Invalid input(s): CK  Micro Results Recent Results (from the past 240 hours)  Respiratory  (~20 pathogens) panel by PCR     Status: None   Collection Time: 04/17/24 10:02 AM   Specimen: Nasopharyngeal Swab; Respiratory  Result Value Ref Range Status   Adenovirus NOT DETECTED NOT DETECTED Final   Coronavirus 229E NOT DETECTED NOT DETECTED Final    Comment: (NOTE) The Coronavirus on the Respiratory Panel, DOES NOT test for the novel  Coronavirus (2019 nCoV)    Coronavirus HKU1 NOT DETECTED NOT DETECTED Final   Coronavirus NL63 NOT DETECTED NOT DETECTED Final   Coronavirus OC43 NOT DETECTED NOT DETECTED Final   Metapneumovirus NOT DETECTED NOT DETECTED Final   Rhinovirus / Enterovirus NOT DETECTED NOT DETECTED Final   Influenza A NOT DETECTED NOT DETECTED Final   Influenza B NOT DETECTED NOT DETECTED Final   Parainfluenza Virus 1 NOT DETECTED NOT DETECTED Final   Parainfluenza Virus 2 NOT DETECTED NOT DETECTED Final   Parainfluenza Virus 3 NOT DETECTED NOT DETECTED Final   Parainfluenza Virus 4 NOT DETECTED NOT DETECTED Final   Respiratory Syncytial Virus NOT DETECTED NOT DETECTED Final   Bordetella pertussis NOT DETECTED NOT DETECTED Final   Bordetella Parapertussis NOT DETECTED NOT DETECTED Final   Chlamydophila pneumoniae NOT DETECTED NOT DETECTED Final   Mycoplasma pneumoniae NOT DETECTED NOT DETECTED Final    Comment: Performed at [facility], [address], [place], [place] [address]  SARS Coronavirus 2 by RT PCR (hospital order, performed in [facility] hospital lab) *cepheid single result test* Nasal Mucosa     Status: None   Collection Time: 04/17/24 10:02 AM   Specimen: Nasal Mucosa; Nasal Swab  Result Value Ref Range Status   SARS Coronavirus 2 by RT PCR NEGATIVE NEGATIVE Final    Comment: Performed at [facility], [address] [place], [place] [address]  Culture, blood (Routine X 2) w Reflex to ID Panel     Status: None   Collection Time: 04/17/24 10:55 AM   Specimen: BLOOD LEFT ARM  Result Value Ref Range Status   Specimen Description  BLOOD  LEFT ARM  Final   Special Requests   Final    BOTTLES DRAWN AEROBIC AND ANAEROBIC Blood Culture results may not be optimal due to an inadequate volume of blood received in culture bottles   Culture   Final    NO GROWTH 5 DAYS Performed at [facility], [address], [place], [place] [address]    Report Status 04/22/2024 FINAL  Final  Culture, blood (Routine X 2) w Reflex to ID Panel     Status: None   Collection Time: 04/17/24 10:59 AM   Specimen: BLOOD  Result Value Ref Range Status   Specimen Description BLOOD SITE NOT SPECIFIED  Final   Special Requests   Final    BOTTLES DRAWN AEROBIC AND ANAEROBIC Blood Culture results may not be optimal due to an inadequate volume of blood received in culture bottles   Culture   Final    NO GROWTH 5 DAYS Performed at [facility], [address], [place], [place] [address]    Report Status 04/22/2024 FINAL  Final  Gastrointestinal Panel by PCR , Stool     Status: Abnormal   Collection Time: 04/18/24 12:38 AM   Specimen: Stool  Result Value Ref Range Status   Campylobacter species NOT DETECTED NOT DETECTED Final   Plesimonas shigelloides NOT DETECTED NOT DETECTED Final   Salmonella species NOT DETECTED NOT DETECTED Final   Yersinia enterocolitica NOT DETECTED NOT DETECTED Final   Vibrio species NOT DETECTED NOT DETECTED Final   Vibrio cholerae NOT DETECTED NOT DETECTED Final   Enteroaggregative E coli (EAEC) NOT DETECTED NOT DETECTED Final   Enteropathogenic E coli (EPEC) DETECTED (A) NOT DETECTED Final    Comment: RESULT CALLED TO, READ BACK BY AND VERIFIED WITH: KEDRIC DARING RN [phone] 04/18/24 HNM    Enterotoxigenic E coli (ETEC) NOT DETECTED NOT DETECTED Final   Shiga like toxin producing E coli (STEC) NOT DETECTED NOT DETECTED Final   Shigella/Enteroinvasive E coli (EIEC) DETECTED (A) NOT DETECTED Final    Comment: RESULT CALLED TO, READ BACK BY AND VERIFIED WITH: KEDRIC DARING RN [phone] 04/18/24 HNM    Cryptosporidium NOT  DETECTED NOT DETECTED Final   Cyclospora cayetanensis NOT DETECTED NOT DETECTED Final   Entamoeba histolytica NOT DETECTED NOT DETECTED Final   Giardia lamblia NOT DETECTED NOT DETECTED Final   Adenovirus F40/41 NOT DETECTED NOT DETECTED Final   Astrovirus NOT DETECTED NOT DETECTED Final   Norovirus GI/GII NOT DETECTED NOT DETECTED Final   Rotavirus A NOT DETECTED NOT DETECTED Final   Sapovirus (I, II, IV, and V) NOT DETECTED NOT DETECTED Final    Comment: Performed at [facility], [address] [place], [place] [address]  MRSA Next Gen by PCR, Nasal     Status: None   Collection Time: 04/18/24  5:45 AM   Specimen: Nasal Mucosa; Nasal Swab  Result Value Ref Range Status   MRSA by PCR Next Gen NOT DETECTED NOT DETECTED Final    Comment: (NOTE) The GeneXpert MRSA Assay (FDA approved for NASAL specimens only), is one component of a comprehensive MRSA colonization surveillance program. It is not intended to diagnose MRSA infection nor to guide or monitor treatment for MRSA infections. Test performance is not FDA approved in patients less than 66 years old. Performed at [facility], [address], [place], [place] [address]   Fungus Culture With Stain     Status: None (Preliminary result)   Collection Time:  04/19/24 12:38 PM   Specimen: Bronchial Alveolar Lavage; Respiratory  Result Value Ref Range Status   Fungus Stain Final report  Final    Comment: (NOTE) Performed At: [facility] [address] [place], [place] [id number] Jennette Shorter MD Ey:[id number]    Fungus (Mycology) Culture PENDING  Incomplete   Fungal Source BRONCHIAL ALVEOLAR LAVAGE  Final    Comment: Performed at [facility], [address] [place], [place] [address]  Culture, BAL-quantitative w Gram Stain     Status: Abnormal (Preliminary result)   Collection Time: 04/19/24 12:38 PM   Specimen: Bronchial Alveolar Lavage; Respiratory  Result Value Ref Range Status   Specimen  Description BRONCHIAL ALVEOLAR LAVAGE  Final   Special Requests NONE  Final   Gram Stain   Final    NO WBC SEEN NO ORGANISMS SEEN Performed at [facility], [address], [place], [place] [address]    Culture (A)  Final    2,000 COLONIES/mL ACINETOBACTER CALCOACETICUS/BAUMANNII COMPLEX   Report Status PENDING  Incomplete   Organism ID, Bacteria ACINETOBACTER CALCOACETICUS/BAUMANNII COMPLEX (A)  Final      Susceptibility   Acinetobacter calcoaceticus/baumannii complex - MIC*    PIP/TAZO Value in next row Resistant      >=128 RESISTANTThis is a modified FDA-approved test that has been validated and its performance characteristics determined by the reporting laboratory.  This laboratory is certified under the Clinical Laboratory Improvement Amendments CLIA as qualified to perform high complexity clinical laboratory testing.    AMPICILLIN /SULBACTAM Value in next row Sensitive      >=128 RESISTANTThis is a modified FDA-approved test that has been validated and its performance characteristics determined by the reporting laboratory.  This laboratory is certified under the Clinical Laboratory Improvement Amendments CLIA as qualified to perform high complexity clinical laboratory testing.    MEROPENEM Value in next row Sensitive      >=128 RESISTANTThis is a modified FDA-approved test that has been validated and its performance characteristics determined by the reporting laboratory.  This laboratory is certified under the Clinical Laboratory Improvement Amendments CLIA as qualified to perform high complexity clinical laboratory testing.    * 2,000 COLONIES/mL ACINETOBACTER CALCOACETICUS/BAUMANNII COMPLEX  Acid Fast Smear (AFB)     Status: None   Collection Time: 04/19/24 12:38 PM   Specimen: Bronchial Alveolar Lavage; Respiratory  Result Value Ref Range Status   AFB Specimen Processing Concentration  Final   Acid Fast Smear Negative  Final    Comment: (NOTE) Performed At: [facility] [address] [place], [place] [id number] Jennette Shorter MD Ey:[id number]    Source (AFB) BRONCHIAL ALVEOLAR LAVAGE  Final    Comment: Performed at [facility], [address], [place], [place] [address]  Fungus Culture Result     Status: None   Collection Time: 04/19/24 12:38 PM  Result Value Ref Range Status   Result 1 Comment  Final    Comment: (NOTE) KOH/Calcofluor preparation:  no fungus observed. Performed At: [facility] [address] [place], [place] [id number] Jennette Shorter MD Ey:[phone]     Radiology Report DG CHEST PORT 1 VIEW Result Date: 04/23/2024 CLINICAL DATA:  Shortness of breath EXAM: PORTABLE CHEST 1 VIEW COMPARISON:  Film from earlier in the same day FINDINGS: Cardiac shadow is prominent but stable. Lungs are well aerated bilaterally. Increasing interstitial and alveolar opacities are noted consistent with worsening edema. Increasing effusions are noted particularly on the right. No bony abnormality is noted.  IMPRESSION: Increasing CHF Electronically Signed   By: Oneil Devonshire M.D.   On: 04/23/2024 23:05      Signature  -   Brayton Lye M.D on 04/25/2024 at 4:51 PM   -  To page go to [email or web address]

## 2024-04-25 NOTE — Progress Notes (Signed)
 Nutrition Follow-up  DOCUMENTATION CODES:   Severe malnutrition in context of social or environmental circumstances  INTERVENTION:  Continue liberalized diet to regular Room service with assist Nursing to assist with feeding pt  Continue Boost Breeze po TID, each supplement provides 250 kcal and 9 grams of protein Continue MVI with minerals daily  Continue Thiamine  and folic acid  supplementation  Recommend updated weight  NUTRITION DIAGNOSIS:  Severe Malnutrition related to social / environmental circumstances, chronic illness (Chronic alcohol abuse and homelessness) as evidenced by severe muscle depletion, severe fat depletion. - remains applicable  GOAL:  Patient will meet greater than or equal to 90% of their needs - progressing  MONITOR:  PO intake, Supplement acceptance, Diet advancement, Labs, I & O's, Skin  REASON FOR ASSESSMENT:  Consult Assessment of nutrition requirement/status  ASSESSMENT:   68 y.o. year old male with PMHof heavy alcohol use (9 beers per day), DVT, PUD, partial gastrectomy, smoking, prediabetes, HTN, cervical spinal stenosis, and homelessness.  Presented to the ED after being found by a bystander in a parking lot.  Workup consistent with being hypoglycemic, hyponatremia, dehydration with multiple electrolyte abnormalities.  Chest x-ray also showed left upper lobe cavitary lesion.  Patient in bed resting at time of visit. Pleasant and conversant. Pt mildly confused. Cannot answer a lot of RD questions, often misunderstanding or becoming side tracked. SNF placement pending insurance authorization. Likely discharge tomorrow.   Average Meal Intake No documented meal intake to review  RN reports intake has been inadequate. Documented as refusing Boost Breeze supplements. Encouraged him to take in supplements to augment intake. Did accept one offering this morning. Unsure of how much he consumed.  Vitamin supplements continue. SLP did upgrade diet to  regular/thin liquids on 9/16. Patient tolerating.   Admit Weight: 68 kg Current Weight: 68 kg +mild, pitting edema to BLEs  New weight collected and exactly the same as admission weight, which is questionable as he appears much smaller and admission weight appears stated. Will request another weight to verify trend. Noted with mild, pitting edema to BLEs. Bowels stable. Cannot assess UOP as documentation is inadequate.   Meds: folic acid , SS Novolog  0-9 TID, MVI, sodium chloride , thiamine , IV ABX  Patient did refeed. Electrolytes stable.   Labs from 9/16 reviewed and unremarkable: Na+ 133>134>133 (L) K+ 3.3--->4.2(wdl) PHOS 2.0--->4.0 (wdl) Mg 1.5--->1.8 (wdl) CRP 13.2>8.7>4.6 (9/14) CBGs 84-125 x24 hours A1c 4.1 (04/2024)  Diet Order:   Diet Order             Diet regular Room service appropriate? Yes with Assist; Fluid consistency: Thin  Diet effective now            EDUCATION NEEDS:  Not appropriate for education at this time  Skin:  Skin Assessment: Reviewed RN Assessment  Last BM:  9/9, x3 BM  Height:  Ht Readings from Last 1 Encounters:  04/19/24 5' 5 (1.651 m)   Weight:  Wt Readings from Last 1 Encounters:  04/19/24 68 kg   Ideal Body Weight:  61.8 kg  BMI:  Body mass index is 24.95 kg/m.  Estimated Nutritional Needs:   Kcal:  1700-2000 kcal  Protein:  75-100 gm  Fluid:  1.2L fluid restriction per MD  Blair Deaner MS, RD, LDN Registered Dietitian Clinical Nutrition RD Inpatient Contact Info in Amion

## 2024-04-26 ENCOUNTER — Inpatient Hospital Stay (HOSPITAL_COMMUNITY)

## 2024-04-26 DIAGNOSIS — F1010 Alcohol abuse, uncomplicated: Secondary | ICD-10-CM | POA: Diagnosis not present

## 2024-04-26 LAB — CBC
HCT: 25.1 % — ABNORMAL LOW (ref 39.0–52.0)
Hemoglobin: 8.5 g/dL — ABNORMAL LOW (ref 13.0–17.0)
MCH: 33.7 pg (ref 26.0–34.0)
MCHC: 33.9 g/dL (ref 30.0–36.0)
MCV: 99.6 fL (ref 80.0–100.0)
Platelets: 343 K/uL (ref 150–400)
RBC: 2.52 MIL/uL — ABNORMAL LOW (ref 4.22–5.81)
RDW: 14.1 % (ref 11.5–15.5)
WBC: 5.1 K/uL (ref 4.0–10.5)
nRBC: 0 % (ref 0.0–0.2)

## 2024-04-26 LAB — BASIC METABOLIC PANEL WITH GFR
Anion gap: 14 (ref 5–15)
BUN: 8 mg/dL (ref 8–23)
CO2: 20 mmol/L — ABNORMAL LOW (ref 22–32)
Calcium: 8.2 mg/dL — ABNORMAL LOW (ref 8.9–10.3)
Chloride: 108 mmol/L (ref 98–111)
Creatinine, Ser: 0.66 mg/dL (ref 0.61–1.24)
GFR, Estimated: >60 mL/min (ref >60)
Glucose, Bld: 135 mg/dL — ABNORMAL HIGH (ref 70–99)
Potassium: 3.4 mmol/L — ABNORMAL LOW (ref 3.5–5.1)
Sodium: 142 mmol/L (ref 135–145)

## 2024-04-26 LAB — PHOSPHORUS: Phosphorus: 3.1 mg/dL (ref 2.5–4.6)

## 2024-04-26 LAB — GLUCOSE, CAPILLARY
Glucose-Capillary: 91 mg/dL (ref 70–99)
Glucose-Capillary: 118 mg/dL — ABNORMAL HIGH (ref 70–99)
Glucose-Capillary: 127 mg/dL — ABNORMAL HIGH (ref 70–99)
Glucose-Capillary: 95 mg/dL (ref 70–99)

## 2024-04-26 LAB — MAGNESIUM: Magnesium: 1.7 mg/dL (ref 1.7–2.4)

## 2024-04-26 MED ORDER — THIAMINE HCL 100 MG/ML IJ SOLN: 100.0000 mg | Freq: Every day | INTRAMUSCULAR | Status: DC

## 2024-04-26 MED ORDER — THIAMINE MONONITRATE 100 MG PO TABS
100.0000 mg | ORAL_TABLET | Freq: Every day | ORAL | Status: DC
  Administered 2024-04-29 – 2024-05-02 (×4): 100 mg via ORAL
  Filled 2024-04-26 (×4): qty 1

## 2024-04-26 MED ORDER — THIAMINE HCL 100 MG/ML IJ SOLN
500.0000 mg | Freq: Every day | INTRAMUSCULAR | Status: AC
  Administered 2024-04-26 – 2024-04-28 (×3): 500 mg via INTRAVENOUS
  Filled 2024-04-26: qty 3
  Filled 2024-04-26 (×3): qty 5

## 2024-04-26 MED ORDER — POTASSIUM CHLORIDE CRYS ER 20 MEQ PO TBCR
40.0000 meq | EXTENDED_RELEASE_TABLET | Freq: Once | ORAL | Status: AC
  Administered 2024-04-26: 40 meq via ORAL
  Filled 2024-04-26: qty 2

## 2024-04-26 NOTE — Progress Notes (Addendum)
 PROGRESS NOTE                                                                                                                                                                                                             Patient Demographics:    Cody Glenn, is a 68 y.o. male, DOB - 26-Oct-1955, FMW:[id number]  Outpatient Primary MD for the patient is [name], MD    LOS - 10  Admit date - 04/15/2024    Chief Complaint  Patient presents with   Hypoglycemia   Alcohol Intoxication       Brief Narrative (HPI from H&P)      68 y.o. year old male with past medical history of heavy alcohol use, DVT, PUD, history of partial gastrectomy, active smoking, prediabetes, primary hypertension, cervical spinal stenosis, and homelessness.  He presents to [name], ED after being found by a bystander in a parking lot.  On EMS arrival patient was hypoglycemic and was given an amp of D50.  In the ER workup consistent with hyponatremia, dehydration with multiple electrolyte abnormalities.  Chest x-ray also showed left upper lobe cavitary lesion.   Subjective:   Had a good night sleep, he denies any complaints this morning, but yesterday had an episode of confusion, had a fall was found on the floor,    Assessment  & Plan :    Severe dehydration, hyponatremia, hypomagnesemia, hypocalcemia, hypophosphatemia.  In a patient with ongoing alcohol abuse, homeless. Nephrology on board being gently hydrated, sodium gradually improving, electrolytes have been replaced and will be monitored.   Left lung cavitary lesion with high fever.  Septic.  Homeless, alcoholic, high risk for chronic aspiration, sepsis workup initiated, IV fluids, pancultured, CT chest confirms left-sided cavitary lesion questionable abscess, he has had pulmonary evaluation for the same few weeks ago and had received antibiotics at that time, currently on Unasyn , case  discussed with ID, his BAL cultures finally came back yesterday ACINETOBACTER CALCOACETICUS/BAUMANNII COMPLEX recommendations for 2 weeks of IV Unasyn , started 9/10, stop date 9/24.   # Shigella gastroenteritis with diarrhea. 7 days of azithromycin .  ##Protein Calorie Malnutrition  Dietitian consulted.   #Alcohol use disorder Hospital delirium Patient reports drinking 6-8 beers daily, reports last drink was yesterday - Patient counseled on importance of cessation - CIWA protocol with PRN symptom triggered Ativan  -  Had some intermittent confusion yesterday, likely at this time he is out of his withdrawals, this is most likely in the setting of hospital delirium, he started on low-dose Seroquel  at bedtime, he is awake alert and pleasant this morning   #Tobacco Use Disorder - Patient counseled on importance of cessation - Nicotine  replacement therapy with nicotine  patch and gum while inpatient  #Normocytic Anemia Stable   #COPD Not on any outpatient maintenance inhalers -PRN Albuterol    #Hypertension - Patient reports he is not taking Diovan  - BP normotensive, monitor   #Peptic Ulcer Disease - Protonix    #Chronic Neck Pain - Lidocaine  patch - PRN toradol   Hypokalemia - Replaced  #Normocytic Anemia Stable   #COPD Not on any outpatient maintenance inhalers -PRN Albuterol    #Hypertension - Patient reports he is not taking Diovan  - BP normotensive, monitor   #Peptic Ulcer Disease - Protonix    #Chronic Neck Pain - Lidocaine  patch - PRN toradol   ## Mildly elevated free T4 levels with stable TSH.  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    Lab Results  Component Value Date   HGBA1C 4.1 (L) 04/16/2024   CBG (last 3)  Recent Labs    04/25/24 2245  04/26/24 0803 04/26/24 1209  GLUCAP 80 91 118*        Condition - Extremely Guarded  Family Communication  : None present, updated his brother [name] by phone  Code Status : Full code  Consults  : Pulmonary, nephrology  PUD Prophylaxis :  PPI   Procedures  :     Date:04/19/24    Provider Performing:[name]    Procedure(s):  Flexible bronchoscopy with bronchial alveolar lavage   CT chest with IV contrast ordered 04/17/2024 - 1. Increasing size of the thick walled cavitary lesion within the left upper lobe, consistent with progressive cavitary pneumonia. While the soft tissue nodule within the inferior aspect of the lesion is no longer visualized, there has been marked progression in the mural thickening seen along the inferior extent, consistent with progressive infection. There is communication between this cavitary lesion and a subsegmental left upper lobe bronchus as described above. 2. Worsening ground-glass airspace disease within the lingula and left lower lobe, compatible with worsening infection. 3. Trace bilateral pleural effusions, left greater than right. 4. Aortic Atherosclerosis (ICD10-I70.0) and Emphysema (ICD10-J43.9).       Disposition Plan  :    Status is: Inpatient  DVT Prophylaxis  :    heparin  injection 5,000 Units Start: 04/17/24 1400 Place and maintain sequential compression device Start: 04/16/24 1157    Lab Results  Component Value Date   PLT 343 04/26/2024    Diet :  Diet Order             Diet regular Room service appropriate? Yes with Assist; Fluid consistency: Thin  Diet effective now                    Inpatient Medications  Scheduled Meds:  feeding supplement  1 Container Oral TID BM   folic acid   1 mg Oral Daily   heparin  injection (subcutaneous)  5,000 Units Subcutaneous Q8H   insulin  aspart  0-9 Units Subcutaneous TID WC   lidocaine   1 patch  Transdermal Q24H   multivitamin with minerals  1 tablet Oral Daily    nicotine   14 mg Transdermal Daily   pantoprazole   40 mg Oral Daily   QUEtiapine   50 mg Oral QHS   sodium chloride   1 g Oral TID WC   [START ON 04/29/2024] thiamine   100 mg Oral Daily   Or   [START ON 04/29/2024] thiamine   100 mg Intravenous Daily   Continuous Infusions:  ampicillin -sulbactam (UNASYN ) IV 3 g (04/26/24 1248)   thiamine  (VITAMIN B1) injection 500 mg (04/26/24 1122)   PRN Meds:.acetaminophen  **OR** acetaminophen , dextrose , haloperidol  lactate, levalbuterol , LORazepam , LORazepam  **OR** LORazepam , melatonin, nicotine  polacrilex, ondansetron  **OR** ondansetron  (ZOFRAN ) IV, polyethylene glycol  Antibiotics  :    Anti-infectives (From admission, onward)    Start     Dose/Rate Route Frequency Ordered Stop   04/22/24 1045  linezolid  (ZYVOX ) tablet 600 mg  Status:  Discontinued        600 mg Oral Every 12 hours 04/22/24 0948 04/25/24 0947   04/18/24 1100  azithromycin  (ZITHROMAX ) tablet 500 mg        500 mg Oral Daily 04/18/24 1002 04/24/24 0921   04/17/24 1200  Ampicillin -Sulbactam (UNASYN ) 3 g in sodium chloride  0.9 % 100 mL IVPB        3 g 200 mL/hr over 30 Minutes Intravenous Every 6 hours 04/17/24 1025 05/01/24 2359         Objective:   Vitals:   04/26/24 0827 04/26/24 1152 04/26/24 1212 04/26/24 1255  BP: (!) 127/101  (!) 147/116 (!) 132/102  Pulse: 98 (!) 111 (!) 122 88  Resp: (!) 34 (!) 27 (!) 36 (!) 25  Temp: 97.6 F (36.4 C)  97.9 F (36.6 C)   TempSrc: Oral  Oral   SpO2:  97% 92% 98%  Weight:      Height:        Wt Readings from Last 3 Encounters:  04/19/24 68 kg  03/18/24 69 kg  02/20/24 69 kg    No intake or output data in the 24 hours ending 04/26/24 1422     Physical Exam   Awake Alert, seen this morning, no apparent distress Symmetrical Chest wall movement, Good air movement bilaterally, CTAB RRR,No Gallops,Rubs or new Murmurs, No Parasternal Heave +ve B.Sounds, Abd Soft, No tenderness, No rebound - guarding or rigidity. No Cyanosis,  Clubbing or edema, left upper extremity contraction/weakness at baseline        Data Review:    Recent Labs  Lab 04/20/24 0400 04/21/24 0421 04/22/24 0445 04/23/24 2358 04/26/24 0318  WBC 7.5 8.9 8.1 10.4 5.1  HGB 9.0* 8.7* 8.5* 9.5* 8.5*  HCT 26.8* 26.0* 25.4* 29.8* 25.1*  PLT 227 257 276 339 343  MCV 98.9 99.6 98.4 100.7* 99.6  MCH 33.2 33.3 32.9 32.1 33.7  MCHC 33.6 33.5 33.5 31.9 33.9  RDW 13.5 13.6 13.5 13.8 14.1  LYMPHSABS 1.0 2.2 2.1  --   --   MONOABS 0.5 0.6 0.8  --   --   EOSABS 0.0 0.3 0.4  --   --   BASOSABS 0.0 0.1 0.1  --   --     Recent Labs  Lab 04/20/24 0400 04/21/24 0421 04/22/24 0445 04/23/24 2358 04/26/24 0318  NA 130* 133* 134* 133* 142  K 4.6 4.7 4.2 4.2 3.4*  CL 102 101 103 103 108  CO2 18* 22 19* 21* 20*  ANIONGAP 10 10 12 9 14   GLUCOSE 145* 85 84 125* 135*  BUN 8 10 8 8 8   CREATININE 0.55* 0.55* 0.51* 0.71 0.66  AST 22 31 26   --   --   ALT 14 20 20   --   --   ALKPHOS 61 59 53  --   --   BILITOT 0.4 0.4 0.4  --   --   ALBUMIN 1.8* 1.8* 1.6*  --   --   CRP 8.7* 4.6*  --   --   --   PROCALCITON 0.73 0.40 0.12  --   --   LATICACIDVEN  --   --   --  1.8  --   AMMONIA 18 <13 24  --   --   MG 1.9 1.7  --  1.8 1.7  PHOS 3.5 3.4  --  4.0 3.1  CALCIUM  8.1* 8.1* 7.9* 7.9* 8.2*      Recent Labs  Lab 04/20/24 0400 04/21/24 0421 04/22/24 0445 04/23/24 2358 04/26/24 0318  CRP 8.7* 4.6*  --   --   --   PROCALCITON 0.73 0.40 0.12  --   --   LATICACIDVEN  --   --   --  1.8  --   AMMONIA 18 <13 24  --   --   MG 1.9 1.7  --  1.8 1.7  CALCIUM  8.1* 8.1* 7.9* 7.9* 8.2*    Lab Results  Component Value Date   CHOL 213 (H) 10/13/2020   HDL 106 10/13/2020   LDLCALC 93 10/13/2020   TRIG 84 10/13/2020   CHOLHDL 2.0 10/13/2020    Lab Results  Component Value Date   HGBA1C 4.1 (L) 04/16/2024   No results for input(s): TSH, T4TOTAL, FREET4, T3FREE, THYROIDAB in the last 72 hours.  No results for input(s): VITAMINB12,  FOLATE, FERRITIN, TIBC, IRON, RETICCTPCT in the last 72 hours. ------------------------------------------------------------------------------------------------------------------ Cardiac Enzymes No results for input(s): CKMB, TROPONINI, MYOGLOBIN in the last 168 hours.  Invalid input(s): CK  Micro Results Recent Results (from the past 240 hours)  Respiratory (~20 pathogens) panel by PCR     Status: None   Collection Time: 04/17/24 10:02 AM   Specimen: Nasopharyngeal Swab; Respiratory  Result Value Ref Range Status   Adenovirus NOT DETECTED NOT DETECTED Final   Coronavirus 229E NOT DETECTED NOT DETECTED Final    Comment: (NOTE) The Coronavirus on the Respiratory Panel, DOES NOT test for the novel  Coronavirus (2019 nCoV)    Coronavirus HKU1 NOT DETECTED NOT DETECTED Final   Coronavirus NL63 NOT DETECTED NOT DETECTED Final   Coronavirus OC43 NOT DETECTED NOT DETECTED Final   Metapneumovirus NOT DETECTED NOT DETECTED Final   Rhinovirus / Enterovirus NOT DETECTED NOT DETECTED Final   Influenza A NOT DETECTED NOT DETECTED Final   Influenza B NOT DETECTED NOT DETECTED Final   Parainfluenza Virus 1 NOT DETECTED NOT DETECTED Final   Parainfluenza Virus 2 NOT DETECTED NOT DETECTED Final   Parainfluenza Virus 3 NOT DETECTED NOT DETECTED Final   Parainfluenza Virus 4 NOT DETECTED NOT DETECTED Final   Respiratory Syncytial Virus NOT DETECTED NOT DETECTED Final   Bordetella pertussis NOT DETECTED NOT DETECTED Final   Bordetella Parapertussis NOT DETECTED NOT DETECTED Final   Chlamydophila pneumoniae NOT DETECTED NOT DETECTED Final   Mycoplasma pneumoniae NOT DETECTED NOT DETECTED Final    Comment: Performed at [facility], [address], [place], [place] [address]  SARS Coronavirus 2 by RT PCR (hospital order, performed in [facility] hospital lab) *cepheid single result test* Nasal Mucosa  Status: None   Collection Time: 04/17/24 10:02 AM   Specimen: Nasal  Mucosa; Nasal Swab  Result Value Ref Range Status   SARS Coronavirus 2 by RT PCR NEGATIVE NEGATIVE Final    Comment: Performed at [facility], [address], [place], [place] [address]  Culture, blood (Routine X 2) w Reflex to ID Panel     Status: None   Collection Time: 04/17/24 10:55 AM   Specimen: BLOOD LEFT ARM  Result Value Ref Range Status   Specimen Description BLOOD LEFT ARM  Final   Special Requests   Final    BOTTLES DRAWN AEROBIC AND ANAEROBIC Blood Culture results may not be optimal due to an inadequate volume of blood received in culture bottles   Culture   Final    NO GROWTH 5 DAYS Performed at [facility], [address], [place], [place] [address]    Report Status 04/22/2024 FINAL  Final  Culture, blood (Routine X 2) w Reflex to ID Panel     Status: None   Collection Time: 04/17/24 10:59 AM   Specimen: BLOOD  Result Value Ref Range Status   Specimen Description BLOOD SITE NOT SPECIFIED  Final   Special Requests   Final    BOTTLES DRAWN AEROBIC AND ANAEROBIC Blood Culture results may not be optimal due to an inadequate volume of blood received in culture bottles   Culture   Final    NO GROWTH 5 DAYS Performed at [facility], [address] [place], [place] [address]    Report Status 04/22/2024 FINAL  Final  Gastrointestinal Panel by PCR , Stool     Status: Abnormal   Collection Time: 04/18/24 12:38 AM   Specimen: Stool  Result Value Ref Range Status   Campylobacter species NOT DETECTED NOT DETECTED Final   Plesimonas shigelloides NOT DETECTED NOT DETECTED Final   Salmonella species NOT DETECTED NOT DETECTED Final   Yersinia enterocolitica NOT DETECTED NOT DETECTED Final   Vibrio species NOT DETECTED NOT DETECTED Final   Vibrio cholerae NOT DETECTED NOT DETECTED Final   Enteroaggregative E coli (EAEC) NOT DETECTED NOT DETECTED Final   Enteropathogenic E coli (EPEC) DETECTED (A) NOT DETECTED Final    Comment: RESULT CALLED TO, READ BACK BY  AND VERIFIED WITH: KEDRIC DARING RN [phone] 04/18/24 HNM    Enterotoxigenic E coli (ETEC) NOT DETECTED NOT DETECTED Final   Shiga like toxin producing E coli (STEC) NOT DETECTED NOT DETECTED Final   Shigella/Enteroinvasive E coli (EIEC) DETECTED (A) NOT DETECTED Final    Comment: RESULT CALLED TO, READ BACK BY AND VERIFIED WITH: KEDRIC DARING RN [phone] 04/18/24 HNM    Cryptosporidium NOT DETECTED NOT DETECTED Final   Cyclospora cayetanensis NOT DETECTED NOT DETECTED Final   Entamoeba histolytica NOT DETECTED NOT DETECTED Final   Giardia lamblia NOT DETECTED NOT DETECTED Final   Adenovirus F40/41 NOT DETECTED NOT DETECTED Final   Astrovirus NOT DETECTED NOT DETECTED Final   Norovirus GI/GII NOT DETECTED NOT DETECTED Final   Rotavirus A NOT DETECTED NOT DETECTED Final   Sapovirus (I, II, IV, and V) NOT DETECTED NOT DETECTED Final    Comment: Performed at [facility], [address] [place], [place] [address]  MRSA Next Gen by PCR, Nasal     Status: None   Collection Time: 04/18/24  5:45 AM   Specimen: Nasal Mucosa; Nasal Swab  Result Value Ref Range Status   MRSA by PCR Next Gen NOT  DETECTED NOT DETECTED Final    Comment: (NOTE) The GeneXpert MRSA Assay (FDA approved for NASAL specimens only), is one component of a comprehensive MRSA colonization surveillance program. It is not intended to diagnose MRSA infection nor to guide or monitor treatment for MRSA infections. Test performance is not FDA approved in patients less than 34 years old. Performed at [facility], [address] [place], [place] [address]   Fungus Culture With Stain     Status: None (Preliminary result)   Collection Time: 04/19/24 12:38 PM   Specimen: Bronchial Alveolar Lavage; Respiratory  Result Value Ref Range Status   Fungus Stain Final report  Final    Comment: (NOTE) Performed At: [facility] [address] [place], [place] [id number] Jennette Shorter MD Ey:[id number]    Fungus  (Mycology) Culture PENDING  Incomplete   Fungal Source BRONCHIAL ALVEOLAR LAVAGE  Final    Comment: Performed at [facility], [address], [place], [place] [address]  Culture, BAL-quantitative w Gram Stain     Status: Abnormal (Preliminary result)   Collection Time: 04/19/24 12:38 PM   Specimen: Bronchial Alveolar Lavage; Respiratory  Result Value Ref Range Status   Specimen Description BRONCHIAL ALVEOLAR LAVAGE  Final   Special Requests NONE  Final   Gram Stain NO WBC SEEN NO ORGANISMS SEEN   Final   Culture (A)  Final    2,000 COLONIES/mL ACINETOBACTER CALCOACETICUS/BAUMANNII COMPLEX CULTURE REINCUBATED FOR BETTER GROWTH Performed at [facility], [address], [place], [place] [address]    Report Status PENDING  Incomplete   Organism ID, Bacteria ACINETOBACTER CALCOACETICUS/BAUMANNII COMPLEX (A)  Final      Susceptibility   Acinetobacter calcoaceticus/baumannii complex - MIC*    PIP/TAZO Value in next row Resistant      >=128 RESISTANTThis is a modified FDA-approved test that has been validated and its performance characteristics determined by the reporting laboratory.  This laboratory is certified under the Clinical Laboratory Improvement Amendments CLIA as qualified to perform high complexity clinical laboratory testing.    AMPICILLIN /SULBACTAM Value in next row Sensitive      >=128 RESISTANTThis is a modified FDA-approved test that has been validated and its performance characteristics determined by the reporting laboratory.  This laboratory is certified under the Clinical Laboratory Improvement Amendments CLIA as qualified to perform high complexity clinical laboratory testing.    MEROPENEM  Value in next row Sensitive      >=128 RESISTANTThis is a modified FDA-approved test that has been validated and its performance characteristics determined by the reporting laboratory.  This laboratory is certified under the Clinical Laboratory Improvement Amendments CLIA as  qualified to perform high complexity clinical laboratory testing.    * 2,000 COLONIES/mL ACINETOBACTER CALCOACETICUS/BAUMANNII COMPLEX  Acid Fast Smear (AFB)     Status: None   Collection Time: 04/19/24 12:38 PM   Specimen: Bronchial Alveolar Lavage; Respiratory  Result Value Ref Range Status   AFB Specimen Processing Concentration  Final   Acid Fast Smear Negative  Final    Comment: (NOTE) Performed At: [facility] [address] [place], [place] [id number] Jennette Shorter MD Ey:[phone]    Source (AFB) BRONCHIAL ALVEOLAR LAVAGE  Final    Comment: Performed at [facility], [address] [place], [place] [address]  Fungus Culture Result     Status: None   Collection Time: 04/19/24 12:38 PM  Result Value Ref Range Status   Result 1 Comment  Final    Comment: (  NOTE) KOH/Calcofluor preparation:  no fungus observed. Performed At: [facility] [address] [place], [place] [id number] Jennette Shorter MD Ey:[phone]     Radiology Report No results found.     Signature  -   Brayton Lye M.D on 04/26/2024 at 2:22 PM   -  To page go to [email or web address]

## 2024-04-26 NOTE — Plan of Care (Signed)

## 2024-04-26 NOTE — TOC Progression Note (Signed)
 Transition of Care [facility]) - Progression Note    Patient Details  Name: Cody Glenn MRN: [id number] Date of Birth: 06-Jul-1956  [facility] CM/SW Contact  Inocente GORMAN Kindle, LCSW Phone Number: 04/26/2024, 5:32 PM  Clinical Narrative:    [place] still awaiting Therapist, occupational.    Expected Discharge Plan: Skilled Nursing Facility Barriers to Discharge: Continued Medical Work up, SNF Pending bed offer, English as a second language teacher               Expected Discharge Plan and Services In-house Referral: Clinical Social Work     Living arrangements for the past 2 months: Homeless                                       Social Drivers of Health (SDOH) Interventions SDOH Screenings   Food Insecurity: Food Insecurity Present (04/17/2024)  Housing: High Risk (04/17/2024)  Transportation Needs: Unmet Transportation Needs (04/17/2024)  Utilities: Not At Risk (04/17/2024)  Depression (PHQ2-9): Low Risk  (10/13/2020)  Social Connections: Moderately Isolated (04/17/2024)  Tobacco Use: High Risk (04/15/2024)    Readmission Risk Interventions    02/22/2024    2:07 PM  Readmission Risk Prevention Plan  Post Dischage Appt Complete  Medication Screening Complete  Transportation Screening Complete

## 2024-04-27 DIAGNOSIS — F1010 Alcohol abuse, uncomplicated: Secondary | ICD-10-CM | POA: Diagnosis not present

## 2024-04-27 DIAGNOSIS — I5033 Acute on chronic diastolic (congestive) heart failure: Secondary | ICD-10-CM

## 2024-04-27 LAB — BLOOD GAS, ARTERIAL
Acid-base deficit: 5 mmol/L — ABNORMAL HIGH (ref 0.0–2.0)
Bicarbonate: 18.4 mmol/L — ABNORMAL LOW (ref 20.0–28.0)
O2 Saturation: 94.2 %
Patient temperature: 37
pCO2 arterial: 29 mmHg — ABNORMAL LOW (ref 32–48)
pH, Arterial: 7.41 (ref 7.35–7.45)
pO2, Arterial: 68 mmHg — ABNORMAL LOW (ref 83–108)

## 2024-04-27 LAB — BASIC METABOLIC PANEL WITH GFR
Anion gap: 14 (ref 5–15)
BUN: 11 mg/dL (ref 8–23)
CO2: 18 mmol/L — ABNORMAL LOW (ref 22–32)
Calcium: 8.6 mg/dL — ABNORMAL LOW (ref 8.9–10.3)
Chloride: 110 mmol/L (ref 98–111)
Creatinine, Ser: 0.74 mg/dL (ref 0.61–1.24)
GFR, Estimated: >60 mL/min (ref >60)
Glucose, Bld: 115 mg/dL — ABNORMAL HIGH (ref 70–99)
Potassium: 4.2 mmol/L (ref 3.5–5.1)
Sodium: 142 mmol/L (ref 135–145)

## 2024-04-27 LAB — GLUCOSE, CAPILLARY
Glucose-Capillary: 109 mg/dL — ABNORMAL HIGH (ref 70–99)
Glucose-Capillary: 109 mg/dL — ABNORMAL HIGH (ref 70–99)
Glucose-Capillary: 129 mg/dL — ABNORMAL HIGH (ref 70–99)
Glucose-Capillary: 149 mg/dL — ABNORMAL HIGH (ref 70–99)

## 2024-04-27 LAB — TROPONIN I (HIGH SENSITIVITY)
Troponin I (High Sensitivity): 35 ng/L — ABNORMAL HIGH (ref <18)
Troponin I (High Sensitivity): 36 ng/L — ABNORMAL HIGH (ref <18)
Troponin I (High Sensitivity): 35 ng/L — ABNORMAL HIGH (ref <18)

## 2024-04-27 LAB — PROCALCITONIN: Procalcitonin: <0.1 ng/mL

## 2024-04-27 LAB — CBC
HCT: 29.9 % — ABNORMAL LOW (ref 39.0–52.0)
Hemoglobin: 9.4 g/dL — ABNORMAL LOW (ref 13.0–17.0)
MCH: 33 pg (ref 26.0–34.0)
MCHC: 31.4 g/dL (ref 30.0–36.0)
MCV: 104.9 fL — ABNORMAL HIGH (ref 80.0–100.0)
Platelets: 367 K/uL (ref 150–400)
RBC: 2.85 MIL/uL — ABNORMAL LOW (ref 4.22–5.81)
RDW: 14.6 % (ref 11.5–15.5)
WBC: 5.8 K/uL (ref 4.0–10.5)
nRBC: 0 % (ref 0.0–0.2)

## 2024-04-27 LAB — D-DIMER, QUANTITATIVE: D-Dimer, Quant: 1.09 ug{FEU}/mL — ABNORMAL HIGH (ref 0.00–0.50)

## 2024-04-27 LAB — LACTIC ACID, PLASMA
Lactic Acid, Venous: 3.6 mmol/L (ref 0.5–1.9)
Lactic Acid, Venous: 3.4 mmol/L (ref 0.5–1.9)
Lactic Acid, Venous: 2.7 mmol/L (ref 0.5–1.9)

## 2024-04-27 LAB — BRAIN NATRIURETIC PEPTIDE: B Natriuretic Peptide: 3496.3 pg/mL — ABNORMAL HIGH (ref 0.0–100.0)

## 2024-04-27 LAB — MAGNESIUM: Magnesium: 2 mg/dL (ref 1.7–2.4)

## 2024-04-27 LAB — PHOSPHORUS: Phosphorus: 4.4 mg/dL (ref 2.5–4.6)

## 2024-04-27 MED ORDER — VITAMIN B-12 1000 MCG PO TABS
1000.0000 ug | ORAL_TABLET | Freq: Every day | ORAL | Status: DC
  Administered 2024-04-27 – 2024-05-02 (×6): 1000 ug via ORAL
  Filled 2024-04-27 (×6): qty 1

## 2024-04-27 MED ORDER — IPRATROPIUM-ALBUTEROL 0.5-2.5 (3) MG/3ML IN SOLN
3.0000 mL | Freq: Four times a day (QID) | RESPIRATORY_TRACT | Status: DC
  Administered 2024-04-27 – 2024-04-28 (×3): 3 mL via RESPIRATORY_TRACT
  Filled 2024-04-27 (×3): qty 3

## 2024-04-27 MED ORDER — MIRTAZAPINE 15 MG PO TABS
7.5000 mg | ORAL_TABLET | Freq: Every day | ORAL | Status: DC
Start: 2024-04-27 — End: 2024-04-29
  Administered 2024-04-27 – 2024-04-28 (×2): 7.5 mg via ORAL
  Filled 2024-04-27 (×2): qty 1

## 2024-04-27 MED ORDER — SODIUM BICARBONATE 650 MG PO TABS
650.0000 mg | ORAL_TABLET | Freq: Two times a day (BID) | ORAL | Status: AC
  Administered 2024-04-27 – 2024-04-28 (×4): 650 mg via ORAL
  Filled 2024-04-27 (×4): qty 1

## 2024-04-27 MED ORDER — FUROSEMIDE 10 MG/ML IJ SOLN
40.0000 mg | Freq: Once | INTRAMUSCULAR | Status: AC
  Administered 2024-04-27: 40 mg via INTRAVENOUS
  Filled 2024-04-27: qty 4

## 2024-04-27 MED ORDER — METHYLPREDNISOLONE SODIUM SUCC 125 MG IJ SOLR
125.0000 mg | Freq: Every day | INTRAMUSCULAR | Status: DC
  Administered 2024-04-27 – 2024-04-28 (×2): 125 mg via INTRAVENOUS
  Filled 2024-04-27 (×2): qty 2

## 2024-04-27 MED ORDER — SODIUM CHLORIDE 0.9 % IV SOLN
1.0000 g | Freq: Three times a day (TID) | INTRAVENOUS | Status: AC
  Administered 2024-04-27 – 2024-05-01 (×13): 1 g via INTRAVENOUS
  Filled 2024-04-27 (×13): qty 20

## 2024-04-27 NOTE — Progress Notes (Signed)
   04/27/24 1415  Oxygen Therapy/Pulse Ox  O2 Device Nasal Cannula  O2 Therapy Oxygen  O2 Flow Rate (L/min) (S)  4 L/min (Increased from 2 per ABG)  SpO2 100 %      MD aware.

## 2024-04-27 NOTE — Progress Notes (Signed)
ABG obtained and sent to lab. Lab called and notified.

## 2024-04-27 NOTE — Progress Notes (Signed)
 NAME:  Cody Glenn, MRN:  [id number], DOB:  Oct 08, 1955, LOS: 11 ADMISSION DATE:  04/15/2024, CONSULTATION DATE:  04/17/24 REFERRING MD:  Dennise, CHIEF COMPLAINT:  abnormal chest xr   History of Present Illness:  68 yo M PMH etoh abuse, dvt, DM, HTN, cavitary lung lesion who was admitted to [facility] 04/16/24 after presenting to ED for AMS. He was found to be hypoglycemic which was addressed, and have AoC hyponatremia for which nephrology was consulted.  A CXR was obtained 9/10 which reveals LUL cavitary lesion, and air concerning for possible BPF.    PCCM consulted in this setting   He has a known LUL cavitary lesion that was worked up on prior admission, and was dc in July on 3wk augmentin . He reports to me he completed this. No resp complaints. Does endorse frequent coughing when he swallows thin liquids, specifically noting that he coughs a lot when he drinks alcohol.   Pertinent  Medical History  Etoh abuse  Cavitary lung lesion Malnutrition Dvt HTN   Significant Hospital Events: Including procedures, antibiotic start and stop dates in addition to other pertinent events   9/9 admitted 9/10 pulm consult, GI panel positive for EPEC and Shigella 9/11 CT Surgery consult, no need for surgery at this time 9/12 bronchoscopy with BAL performed  Interim History / Subjective:    Called by [facility] due to increased work of breathing Patient denies chest pain, fevers or chills. Denies abdominal pain.  Regular diet resumed 9/16 by Speech  Objective    Blood pressure (!) 128/102, pulse (!) 193, temperature 98 F (36.7 C), temperature source Axillary, resp. rate (!) 34, height 5' 5 (1.651 m), weight 68 kg, SpO2 100%.        Intake/Output Summary (Last 24 hours) at 04/27/2024 1434 Last data filed at 04/27/2024 0230 Gross per 24 hour  Intake 155 ml  Output 100 ml  Net 55 ml   Filed Weights   04/15/24 2033 04/19/24 1154  Weight: 68 kg 68 kg    Examination: General: chronically ill male,  mild distress, laying in bed HENT: NCAT  Lungs: diminished breath sounds, rapid breaths Cardiovascular: rrr Abdomen: soft, non-tender, nondistended Extremities: decrease muscle mass, no edema Neuro: AAOx3  GU: defer  Resolved problem list   Assessment and Plan   Persistent LUL cavitary lung lesion Acinetobacter Pneumonia Aspiration Risk, High Alcohol Use Disorder Multifocal ASD  Suspected dysphagia  EPEC/Shigella gastroenteritis   P - Acinetobacter growth from BAL culture - Has been on Unasyn , but concern for high salt load from antibiotic given current concern for heart failure.  - Switch antibiotics to meropenem  - Diurese as able - f/u echo - trend troponins and EKG - Consider CTA Chest to rule out PE, has been on DVT prophylaxis though  Labs   CBC: Recent Labs  Lab 04/21/24 0421 04/22/24 0445 04/23/24 2358 04/26/24 0318 04/27/24 1039  WBC 8.9 8.1 10.4 5.1 5.8  NEUTROABS 5.8 4.8  --   --   --   HGB 8.7* 8.5* 9.5* 8.5* 9.4*  HCT 26.0* 25.4* 29.8* 25.1* 29.9*  MCV 99.6 98.4 100.7* 99.6 104.9*  PLT 257 276 339 343 367    Basic Metabolic Panel: Recent Labs  Lab 04/21/24 0421 04/22/24 0445 04/23/24 2358 04/26/24 0318 04/27/24 1039  NA 133* 134* 133* 142 142  K 4.7 4.2 4.2 3.4* 4.2  CL 101 103 103 108 110  CO2 22 19* 21* 20* 18*  GLUCOSE 85 84 125* 135* 115*  BUN 10 8 8 8 11   CREATININE 0.55* 0.51* 0.71 0.66 0.74  CALCIUM  8.1* 7.9* 7.9* 8.2* 8.6*  MG 1.7  --  1.8 1.7 2.0  PHOS 3.4  --  4.0 3.1 4.4   GFR: Estimated Creatinine Clearance: 77.9 mL/min (by C-G formula based on SCr of 0.74 mg/dL). Recent Labs  Lab 04/21/24 0421 04/22/24 0445 04/23/24 2358 04/26/24 0318 04/27/24 1039  PROCALCITON 0.40 0.12  --   --  <0.10  WBC 8.9 8.1 10.4 5.1 5.8  LATICACIDVEN  --   --  1.8  --   --     Liver Function Tests: Recent Labs  Lab 04/21/24 0421 04/22/24 0445  AST 31 26  ALT 20 20  ALKPHOS 59 53  BILITOT 0.4 0.4  PROT 5.3* 4.7*  ALBUMIN 1.8*  1.6*   No results for input(s): LIPASE, AMYLASE in the last 168 hours. Recent Labs  Lab 04/21/24 0421 04/22/24 0445  AMMONIA <13 24    ABG    Component Value Date/Time   PHART 7.41 04/27/2024 1353   PCO2ART 29 (L) 04/27/2024 1353   PO2ART 68 (L) 04/27/2024 1353   HCO3 18.4 (L) 04/27/2024 1353   TCO2 20 (L) 04/15/2024 2043   ACIDBASEDEF 5.0 (H) 04/27/2024 1353   O2SAT 94.2 04/27/2024 1353     Coagulation Profile: No results for input(s): INR, PROTIME in the last 168 hours.   Cardiac Enzymes: No results for input(s): CKTOTAL, CKMB, CKMBINDEX, TROPONINI in the last 168 hours.  HbA1C: Hgb A1c MFr Bld  Date/Time Value Ref Range Status  04/16/2024 11:01 AM 4.1 (L) 4.8 - 5.6 % Final    Comment:    (NOTE) Diagnosis of Diabetes The following HbA1c ranges recommended by the American Diabetes Association (ADA) may be used as an aid in the diagnosis of diabetes mellitus.  Hemoglobin             Suggested A1C NGSP%              Diagnosis  <5.7                   Non Diabetic  5.7-6.4                Pre-Diabetic  >6.4                   Diabetic  <7.0                   Glycemic control for                       adults with diabetes.    10/13/2020 02:08 PM 5.5 4.8 - 5.6 % Final    Comment:             Prediabetes: 5.7 - 6.4          Diabetes: >6.4          Glycemic control for adults with diabetes: <7.0     CBG: Recent Labs  Lab 04/26/24 1209 04/26/24 1632 04/26/24 2122 04/27/24 0758 04/27/24 1134  GLUCAP 118* 127* 95 109* 109*       Critical care time: na    Dorn Chill, MD [facility] Office: [phone]   See Amion for personal pager PCCM on call pager [phone] until 7pm. Please call Elink 7p-7a. [phone]

## 2024-04-27 NOTE — Progress Notes (Addendum)
 PROGRESS NOTE                                                                                                                                                                                                             Patient Demographics:    Cody Glenn, is a 68 y.o. male, DOB - 11/01/1955, FMW:[id number]  Outpatient Primary MD for the patient is [name], MD    LOS - 11  Admit date - 04/15/2024    Chief Complaint  Patient presents with   Hypoglycemia   Alcohol Intoxication       Brief Narrative (HPI from H&P)      68 y.o. year old male with past medical history of heavy alcohol use, DVT, PUD, history of partial gastrectomy, active smoking, prediabetes, primary hypertension, cervical spinal stenosis, and homelessness.  He presents to [name], ED after being found by a bystander in a parking lot.  On EMS arrival patient was hypoglycemic and was given an amp of D50.  In the ER workup consistent with hyponatremia, dehydration with multiple electrolyte abnormalities.  Chest x-ray also showed left upper lobe cavitary lesion.   Subjective:   No significant events overnight, he had a good night sleep, no significant events,    Assessment  & Plan :   Alcohol abuse, with withdrawals -On presentation, treated with CIWA protocol.  Now off withdrawal window, will DC CIWA - Continue with thiamine , folic acid  and multivitamin  Dehydration Hyponatremia Hypomagnesemia Hypophosphatemia - Due to above, continue to monitor closely and replete as needed  Sepsis, POA Multifocal pneumonia Persistent left upper lung cavitary lesions -  Homeless, alcoholic, high risk for chronic aspiration, sepsis workup initiated, IV fluids, pancultured, CT chest confirms left-sided cavitary lesion questionable abscess, he has had pulmonary evaluation for the same few weeks ago and had received antibiotics at that time. - Seen by PCCM,  BAL cultures finally came back  ACINETOBACTER CALCOACETICUS/BAUMANNII COMPLEX recommendations for 2 weeks of IV Unasyn , started 9/10, stop date 9/24. - Encouraged to use incentive spirometry and flutter valve  Acute on chronic CHF with a preserved EF -Echo in July 2025 with preserved EF, undetermined diastolic dysfunction -He is with some dyspnea, repeat x-ray this morning significant for volume overload, BNP significantly elevated up from [phone] - Will start on low-dose diuresis -Will repeat 2D echo  to ensure no new drop in EF or valve disease given significant rapid development of his symptoms   Shigella gastroenteritis with diarrhea. - 7 days of azithromycin .  Protein Calorie Malnutrition   -Dietitian consulted. -Will start on mirtazapine     Hospital delirium - Patient improved this morning, not restless or agitated anymore, will stop Seroquel  and start on Remeron   Tobacco Use Disorder - Patient counseled on importance of cessation - Nicotine  replacement therapy with nicotine  patch and gum while inpatient  Normocytic Anemia Stable   COPD Not on any outpatient maintenance inhalers -PRN Albuterol    Hypertension - Patient reports he is not taking Diovan  - BP normotensive, monitor   #Peptic Ulcer Disease - Protonix    #Chronic Neck Pain - Lidocaine  patch - PRN toradol   Hypokalemia - Replaced  #Normocytic Anemia Stable   #COPD Not on any outpatient maintenance inhalers -PRN Albuterol    #Hypertension - Patient reports he is not taking Diovan  - BP normotensive, monitor     #Peptic Ulcer Disease - Protonix    #Chronic Neck Pain - Lidocaine  patch - PRN toradol   ## Mildly elevated free T4 levels with stable TSH.  Repeat TSH and free T4 in 2 to 3 weeks by PCP   DM type II with hypoglycemia upon admission - Likely due to poor oral intake, A1c was low, stable TSH and random cortisol, on low-dose ISS, sugars are erratic likely due to inconsistent oral intake,  monitor.    Lab Results  Component Value Date   HGBA1C 4.1 (L) 04/16/2024   CBG (last 3)  Recent Labs    04/26/24 2122 04/27/24 0758 04/27/24 1134  GLUCAP 95 109* 109*        Condition - Extremely Guarded  Family Communication  : None present  Code Status : Full code  Consults  : Pulmonary, nephrology  PUD Prophylaxis :  PPI   Procedures  :     Date:04/19/24    Provider Performing:[name]    Procedure(s):  Flexible bronchoscopy with bronchial alveolar lavage   CT chest with IV contrast ordered 04/17/2024 - 1. Increasing size of the thick walled cavitary lesion within the left upper lobe, consistent with progressive cavitary pneumonia. While the soft tissue nodule within the inferior aspect of the lesion is no longer visualized, there has been marked progression in the mural thickening seen along the inferior extent, consistent with progressive infection. There is communication between this cavitary lesion and a subsegmental left upper lobe bronchus as described above. 2. Worsening ground-glass airspace disease within the lingula and left lower lobe, compatible with worsening infection. 3. Trace bilateral pleural effusions, left greater than right. 4. Aortic Atherosclerosis (ICD10-I70.0) and Emphysema (ICD10-J43.9).       Disposition Plan  :    Status is: Inpatient  DVT Prophylaxis  :    heparin  injection 5,000 Units Start: 04/17/24 1400 Place and maintain sequential compression device Start: 04/16/24 1157    Lab Results  Component Value Date   PLT 367 04/27/2024    Diet :  Diet Order             Diet regular Room service appropriate? Yes with Assist; Fluid consistency: Thin  Diet effective now                    Inpatient Medications  Scheduled Meds:  vitamin B-12  1,000 mcg Oral Daily   feeding supplement  1 Container Oral TID BM   folic acid   1 mg Oral Daily  heparin  injection (subcutaneous)  5,000 Units Subcutaneous Q8H   insulin   aspart  0-9 Units Subcutaneous TID WC   lidocaine   1 patch Transdermal Q24H   multivitamin with minerals  1 tablet Oral Daily   nicotine   14 mg Transdermal Daily   pantoprazole   40 mg Oral Daily   QUEtiapine   50 mg Oral QHS   [START ON 04/29/2024] thiamine   100 mg Oral Daily   Or   [START ON 04/29/2024] thiamine   100 mg Intravenous Daily   Continuous Infusions:  ampicillin -sulbactam (UNASYN ) IV 3 g (04/27/24 0520)   thiamine  (VITAMIN B1) injection 500 mg (04/27/24 1122)   PRN Meds:.acetaminophen  **OR** acetaminophen , dextrose , haloperidol  lactate, levalbuterol , LORazepam , LORazepam  **OR** LORazepam , melatonin, nicotine  polacrilex, ondansetron  **OR** ondansetron  (ZOFRAN ) IV, polyethylene glycol  Antibiotics  :    Anti-infectives (From admission, onward)    Start     Dose/Rate Route Frequency Ordered Stop   04/22/24 1045  linezolid  (ZYVOX ) tablet 600 mg  Status:  Discontinued        600 mg Oral Every 12 hours 04/22/24 0948 04/25/24 0947   04/18/24 1100  azithromycin  (ZITHROMAX ) tablet 500 mg        500 mg Oral Daily 04/18/24 1002 04/24/24 0921   04/17/24 1200  Ampicillin -Sulbactam (UNASYN ) 3 g in sodium chloride  0.9 % 100 mL IVPB        3 g 200 mL/hr over 30 Minutes Intravenous Every 6 hours 04/17/24 1025 05/01/24 2359         Objective:   Vitals:   04/26/24 1945 04/26/24 2340 04/27/24 0524 04/27/24 0757  BP: (!) 133/111 (!) 138/106 (!) 135/113 (!) 129/106  Pulse: (!) 112 (!) 106 (!) 109 (!) 117  Resp: 19 20  20   Temp: (!) 97.5 F (36.4 C) 98.4 F (36.9 C)  98.2 F (36.8 C)  TempSrc: Axillary Axillary  Oral  SpO2: 93% 97%  90%  Weight:      Height:        Wt Readings from Last 3 Encounters:  04/19/24 68 kg  03/18/24 69 kg  02/20/24 69 kg     Intake/Output Summary (Last 24 hours) at 04/27/2024 1249 Last data filed at 04/27/2024 0230 Gross per 24 hour  Intake 155 ml  Output 100 ml  Net 55 ml       Physical Exam  Alert, chronically appearing, extremely  frail Symmetrical Chest wall movement, coarse respiratory sounds bilaterally RRR,No Gallops,Rubs or new Murmurs, No Parasternal Heave +ve B.Sounds, Abd Soft, No tenderness, No rebound - guarding or rigidity. No Cyanosis, Clubbing or edema, left upper extremity contraction/weakness at baseline        Data Review:    Recent Labs  Lab 04/21/24 0421 04/22/24 0445 04/23/24 2358 04/26/24 0318 04/27/24 1039  WBC 8.9 8.1 10.4 5.1 5.8  HGB 8.7* 8.5* 9.5* 8.5* 9.4*  HCT 26.0* 25.4* 29.8* 25.1* 29.9*  PLT 257 276 339 343 367  MCV 99.6 98.4 100.7* 99.6 104.9*  MCH 33.3 32.9 32.1 33.7 33.0  MCHC 33.5 33.5 31.9 33.9 31.4  RDW 13.6 13.5 13.8 14.1 14.6  LYMPHSABS 2.2 2.1  --   --   --   MONOABS 0.6 0.8  --   --   --   EOSABS 0.3 0.4  --   --   --   BASOSABS 0.1 0.1  --   --   --     Recent Labs  Lab 04/21/24 0421 04/22/24 0445 04/23/24 2358 04/26/24 0318  NA 133*  134* 133* 142  K 4.7 4.2 4.2 3.4*  CL 101 103 103 108  CO2 22 19* 21* 20*  ANIONGAP 10 12 9 14   GLUCOSE 85 84 125* 135*  BUN 10 8 8 8   CREATININE 0.55* 0.51* 0.71 0.66  AST 31 26  --   --   ALT 20 20  --   --   ALKPHOS 59 53  --   --   BILITOT 0.4 0.4  --   --   ALBUMIN 1.8* 1.6*  --   --   CRP 4.6*  --   --   --   PROCALCITON 0.40 0.12  --   --   LATICACIDVEN  --   --  1.8  --   AMMONIA <13 24  --   --   MG 1.7  --  1.8 1.7  PHOS 3.4  --  4.0 3.1  CALCIUM  8.1* 7.9* 7.9* 8.2*      Recent Labs  Lab 04/21/24 0421 04/22/24 0445 04/23/24 2358 04/26/24 0318  CRP 4.6*  --   --   --   PROCALCITON 0.40 0.12  --   --   LATICACIDVEN  --   --  1.8  --   AMMONIA <13 24  --   --   MG 1.7  --  1.8 1.7  CALCIUM  8.1* 7.9* 7.9* 8.2*    Lab Results  Component Value Date   CHOL 213 (H) 10/13/2020   HDL 106 10/13/2020   LDLCALC 93 10/13/2020   TRIG 84 10/13/2020   CHOLHDL 2.0 10/13/2020    Lab Results  Component Value Date   HGBA1C 4.1 (L) 04/16/2024   No results for input(s): TSH, T4TOTAL, FREET4,  T3FREE, THYROIDAB in the last 72 hours.  No results for input(s): VITAMINB12, FOLATE, FERRITIN, TIBC, IRON, RETICCTPCT in the last 72 hours. ------------------------------------------------------------------------------------------------------------------ Cardiac Enzymes No results for input(s): CKMB, TROPONINI, MYOGLOBIN in the last 168 hours.  Invalid input(s): CK  Micro Results Recent Results (from the past 240 hours)  Gastrointestinal Panel by PCR , Stool     Status: Abnormal   Collection Time: 04/18/24 12:38 AM   Specimen: Stool  Result Value Ref Range Status   Campylobacter species NOT DETECTED NOT DETECTED Final   Plesimonas shigelloides NOT DETECTED NOT DETECTED Final   Salmonella species NOT DETECTED NOT DETECTED Final   Yersinia enterocolitica NOT DETECTED NOT DETECTED Final   Vibrio species NOT DETECTED NOT DETECTED Final   Vibrio cholerae NOT DETECTED NOT DETECTED Final   Enteroaggregative E coli (EAEC) NOT DETECTED NOT DETECTED Final   Enteropathogenic E coli (EPEC) DETECTED (A) NOT DETECTED Final    Comment: RESULT CALLED TO, READ BACK BY AND VERIFIED WITH: KEDRIC DARING RN [phone] 04/18/24 HNM    Enterotoxigenic E coli (ETEC) NOT DETECTED NOT DETECTED Final   Shiga like toxin producing E coli (STEC) NOT DETECTED NOT DETECTED Final   Shigella/Enteroinvasive E coli (EIEC) DETECTED (A) NOT DETECTED Final    Comment: RESULT CALLED TO, READ BACK BY AND VERIFIED WITH: KEDRIC DARING RN [phone] 04/18/24 HNM    Cryptosporidium NOT DETECTED NOT DETECTED Final   Cyclospora cayetanensis NOT DETECTED NOT DETECTED Final   Entamoeba histolytica NOT DETECTED NOT DETECTED Final   Giardia lamblia NOT DETECTED NOT DETECTED Final   Adenovirus F40/41 NOT DETECTED NOT DETECTED Final   Astrovirus NOT DETECTED NOT DETECTED Final   Norovirus GI/GII NOT DETECTED NOT DETECTED Final   Rotavirus A NOT DETECTED NOT DETECTED Final  Sapovirus (I, II, IV, and V) NOT DETECTED  NOT DETECTED Final    Comment: Performed at [facility], [address] [place], [place] [address]  MRSA Next Gen by PCR, Nasal     Status: None   Collection Time: 04/18/24  5:45 AM   Specimen: Nasal Mucosa; Nasal Swab  Result Value Ref Range Status   MRSA by PCR Next Gen NOT DETECTED NOT DETECTED Final    Comment: (NOTE) The GeneXpert MRSA Assay (FDA approved for NASAL specimens only), is one component of a comprehensive MRSA colonization surveillance program. It is not intended to diagnose MRSA infection nor to guide or monitor treatment for MRSA infections. Test performance is not FDA approved in patients less than 62 years old. Performed at [facility], [address], [place], [place] [address]   Fungus Culture With Stain     Status: None (Preliminary result)   Collection Time: 04/19/24 12:38 PM   Specimen: Bronchial Alveolar Lavage; Respiratory  Result Value Ref Range Status   Fungus Stain Final report  Final    Comment: (NOTE) Performed At: [facility] [address] [place], [place] [id number] Jennette Shorter MD Ey:[id number]    Fungus (Mycology) Culture PENDING  Incomplete   Fungal Source BRONCHIAL ALVEOLAR LAVAGE  Final    Comment: Performed at [facility], [address] [place], [place] [address]  Culture, BAL-quantitative w Gram Stain     Status: Abnormal (Preliminary result)   Collection Time: 04/19/24 12:38 PM   Specimen: Bronchial Alveolar Lavage; Respiratory  Result Value Ref Range Status   Specimen Description BRONCHIAL ALVEOLAR LAVAGE  Final   Special Requests NONE  Final   Gram Stain NO WBC SEEN NO ORGANISMS SEEN   Final   Culture (A)  Final    2,000 COLONIES/mL ACINETOBACTER CALCOACETICUS/BAUMANNII COMPLEX CULTURE REINCUBATED FOR BETTER GROWTH Performed at [facility], [address], [place], [place] [address]    Report Status PENDING  Incomplete   Organism ID, Bacteria ACINETOBACTER CALCOACETICUS/BAUMANNII  COMPLEX (A)  Final      Susceptibility   Acinetobacter calcoaceticus/baumannii complex - MIC*    PIP/TAZO Value in next row Resistant      >=128 RESISTANTThis is a modified FDA-approved test that has been validated and its performance characteristics determined by the reporting laboratory.  This laboratory is certified under the Clinical Laboratory Improvement Amendments CLIA as qualified to perform high complexity clinical laboratory testing.    AMPICILLIN /SULBACTAM Value in next row Sensitive      >=128 RESISTANTThis is a modified FDA-approved test that has been validated and its performance characteristics determined by the reporting laboratory.  This laboratory is certified under the Clinical Laboratory Improvement Amendments CLIA as qualified to perform high complexity clinical laboratory testing.    MEROPENEM  Value in next row Sensitive      >=128 RESISTANTThis is a modified FDA-approved test that has been validated and its performance characteristics determined by the reporting laboratory.  This laboratory is certified under the Clinical Laboratory Improvement Amendments CLIA as qualified to perform high complexity clinical laboratory testing.    * 2,000 COLONIES/mL ACINETOBACTER CALCOACETICUS/BAUMANNII COMPLEX  Acid Fast Smear (AFB)     Status: None   Collection Time: 04/19/24 12:38 PM   Specimen: Bronchial Alveolar Lavage; Respiratory  Result Value Ref Range Status   AFB Specimen Processing Concentration  Final   Acid Fast Smear Negative  Final    Comment: (NOTE) Performed At: [facility] [address]  [address] [place], [place] [id number] Jennette Shorter MD Ey:[id number]    Source (AFB) BRONCHIAL ALVEOLAR LAVAGE  Final    Comment: Performed at [facility], [address] [place], [place] [address]  Fungus Culture Result     Status: None   Collection Time: 04/19/24 12:38 PM  Result Value Ref Range Status   Result 1 Comment  Final    Comment: (NOTE) KOH/Calcofluor  preparation:  no fungus observed. Performed At: [facility] [address] [place], [place] [id number] Jennette Shorter MD Ey:[id number]     Radiology Report DG Chest Port 1 View Result Date: 04/26/2024 CLINICAL DATA:  Hypoxia EXAM: PORTABLE CHEST 1 VIEW COMPARISON:  04/23/2024 FINDINGS: Stable cardiac silhouette. Severe diffuse bilateral airspace disease and interstitial opacities, worsening since prior study. Small bilateral pleural effusions, stable. IMPRESSION: Worsening bilateral interstitial and airspace opacities, likely worsening edema or infection. Small bilateral effusions, stable. Electronically Signed   By: Franky Crease M.D.   On: 04/26/2024 20:16       Signature  -   Brayton Lye M.D on 04/27/2024 at 12:49 PM   -  To page go to [email or web address]

## 2024-04-27 NOTE — Plan of Care (Signed)

## 2024-04-27 NOTE — Plan of Care (Signed)

## 2024-04-27 NOTE — TOC Progression Note (Addendum)
 Transition of Care [facility]) - Progression Note    Patient Details  Name: Cody Glenn MRN: [id number] Date of Birth: [birth year]  [facility] CM/SW Contact  Isaiah Public, LCSWA Phone Number: 04/27/2024, 1:33 PM  Clinical Narrative:     Donald with [facility] informed CSW that facility received insurance authorization approval for patient. Donald with facility confirmed they can accept patient when medically ready. CSW informed MD.    Expected Discharge Plan: Skilled Nursing Facility Barriers to Discharge: Continued Medical Work up, SNF Pending bed offer, Insurance Authorization               Expected Discharge Plan and Services In-house Referral: Clinical Social Work     Living arrangements for the past 2 months: Homeless                                       Social Drivers of Health (SDOH) Interventions SDOH Screenings   Food Insecurity: Food Insecurity Present (04/17/2024)  Housing: High Risk (04/17/2024)  Transportation Needs: Unmet Transportation Needs (04/17/2024)  Utilities: Not At Risk (04/17/2024)  Depression (PHQ2-9): Low Risk  (10/13/2020)  Social Connections: Moderately Isolated (04/17/2024)  Tobacco Use: High Risk (04/15/2024)    Readmission Risk Interventions    02/22/2024    2:07 PM  Readmission Risk Prevention Plan  Post Dischage Appt Complete  Medication Screening Complete  Transportation Screening Complete

## 2024-04-28 ENCOUNTER — Inpatient Hospital Stay (HOSPITAL_COMMUNITY)

## 2024-04-28 DIAGNOSIS — R609 Edema, unspecified: Secondary | ICD-10-CM | POA: Diagnosis not present

## 2024-04-28 DIAGNOSIS — J9601 Acute respiratory failure with hypoxia: Secondary | ICD-10-CM | POA: Diagnosis not present

## 2024-04-28 LAB — CBC
HCT: 27.2 % — ABNORMAL LOW (ref 39.0–52.0)
Hemoglobin: 8.9 g/dL — ABNORMAL LOW (ref 13.0–17.0)
MCH: 33.1 pg (ref 26.0–34.0)
MCHC: 32.7 g/dL (ref 30.0–36.0)
MCV: 101.1 fL — ABNORMAL HIGH (ref 80.0–100.0)
Platelets: 304 K/uL (ref 150–400)
RBC: 2.69 MIL/uL — ABNORMAL LOW (ref 4.22–5.81)
RDW: 14.7 % (ref 11.5–15.5)
WBC: 3.3 K/uL — ABNORMAL LOW (ref 4.0–10.5)
nRBC: 0 % (ref 0.0–0.2)

## 2024-04-28 LAB — MAGNESIUM: Magnesium: 2 mg/dL (ref 1.7–2.4)

## 2024-04-28 LAB — GLUCOSE, CAPILLARY
Glucose-Capillary: 100 mg/dL — ABNORMAL HIGH (ref 70–99)
Glucose-Capillary: 122 mg/dL — ABNORMAL HIGH (ref 70–99)
Glucose-Capillary: 165 mg/dL — ABNORMAL HIGH (ref 70–99)
Glucose-Capillary: 145 mg/dL — ABNORMAL HIGH (ref 70–99)

## 2024-04-28 LAB — BASIC METABOLIC PANEL WITH GFR
Anion gap: 11 (ref 5–15)
BUN: 13 mg/dL (ref 8–23)
CO2: 20 mmol/L — ABNORMAL LOW (ref 22–32)
Calcium: 8.7 mg/dL — ABNORMAL LOW (ref 8.9–10.3)
Chloride: 112 mmol/L — ABNORMAL HIGH (ref 98–111)
Creatinine, Ser: 0.82 mg/dL (ref 0.61–1.24)
GFR, Estimated: >60 mL/min (ref >60)
Glucose, Bld: 145 mg/dL — ABNORMAL HIGH (ref 70–99)
Potassium: 4.2 mmol/L (ref 3.5–5.1)
Sodium: 143 mmol/L (ref 135–145)

## 2024-04-28 LAB — BRAIN NATRIURETIC PEPTIDE: B Natriuretic Peptide: 3842.6 pg/mL — ABNORMAL HIGH (ref 0.0–100.0)

## 2024-04-28 LAB — PHOSPHORUS: Phosphorus: 4.5 mg/dL (ref 2.5–4.6)

## 2024-04-28 MED ORDER — FUROSEMIDE 10 MG/ML IJ SOLN: 40.0000 mg | Freq: Two times a day (BID) | INTRAMUSCULAR | Status: DC

## 2024-04-28 MED ORDER — FUROSEMIDE 10 MG/ML IJ SOLN
60.0000 mg | Freq: Two times a day (BID) | INTRAMUSCULAR | Status: DC
  Administered 2024-04-28: 60 mg via INTRAVENOUS
  Filled 2024-04-28: qty 6

## 2024-04-28 MED ORDER — FUROSEMIDE 10 MG/ML IJ SOLN
40.0000 mg | Freq: Once | INTRAMUSCULAR | Status: AC
  Administered 2024-04-28: 40 mg via INTRAVENOUS
  Filled 2024-04-28: qty 4

## 2024-04-28 MED ORDER — POTASSIUM CHLORIDE CRYS ER 20 MEQ PO TBCR
40.0000 meq | EXTENDED_RELEASE_TABLET | Freq: Once | ORAL | Status: AC
  Administered 2024-04-28: 40 meq via ORAL
  Filled 2024-04-28: qty 2

## 2024-04-28 MED ORDER — IOHEXOL 350 MG/ML SOLN
75.0000 mL | Freq: Once | INTRAVENOUS | Status: AC | PRN
Start: 2024-04-28 — End: 2024-04-28
  Administered 2024-04-28: 75 mL via INTRAVENOUS

## 2024-04-28 MED ORDER — FUROSEMIDE 10 MG/ML IJ SOLN: 80.0000 mg | Freq: Two times a day (BID) | INTRAMUSCULAR | Status: DC

## 2024-04-28 MED ORDER — IPRATROPIUM-ALBUTEROL 0.5-2.5 (3) MG/3ML IN SOLN
3.0000 mL | Freq: Three times a day (TID) | RESPIRATORY_TRACT | Status: DC
  Administered 2024-04-28: 3 mL via RESPIRATORY_TRACT
  Filled 2024-04-28: qty 3

## 2024-04-28 NOTE — Progress Notes (Signed)
 NAME:  Cody Glenn, MRN:  [id number], DOB:  [birth year], LOS: 12 ADMISSION DATE:  04/15/2024, CONSULTATION DATE:  04/17/24 REFERRING MD:  Dennise, CHIEF COMPLAINT:  abnormal chest xr   History of Present Illness:  68 yo M PMH etoh abuse, dvt, DM, HTN, cavitary lung lesion who was admitted to [facility] 04/16/24 after presenting to ED for AMS. He was found to be hypoglycemic which was addressed, and have AoC hyponatremia for which nephrology was consulted.  A CXR was obtained 9/10 which reveals LUL cavitary lesion, and air concerning for possible BPF.    PCCM consulted in this setting   He has a known LUL cavitary lesion that was worked up on prior admission, and was dc in July on 3wk augmentin . He reports to me he completed this. No resp complaints. Does endorse frequent coughing when he swallows thin liquids, specifically noting that he coughs a lot when he drinks alcohol.   Pertinent  Medical History  Etoh abuse  Cavitary lung lesion Malnutrition Dvt HTN   Significant Hospital Events: Including procedures, antibiotic start and stop dates in addition to other pertinent events   9/9 admitted 9/10 pulm consult, GI panel positive for EPEC and Shigella 9/11 CT Surgery consult, no need for surgery at this time 9/12 bronchoscopy with BAL performed, BAL culture growing acinetobacter 9/20 PCCM re-consulted for acute worsening in respiratory status, concern for acute CHF exacerbation. Regular diet resumed 9/16 by Speech  Interim History / Subjective:       Objective    Blood pressure (!) 128/99, pulse (!) 105, temperature (!) 97.5 F (36.4 C), temperature source Oral, resp. rate 20, height 5' 5 (1.651 m), weight 68 kg, SpO2 100%.        Intake/Output Summary (Last 24 hours) at 04/28/2024 0810 Last data filed at 04/28/2024 9480 Gross per 24 hour  Intake --  Output 300 ml  Net -300 ml   Filed Weights   04/15/24 2033 04/19/24 1154  Weight: 68 kg 68 kg    Examination: General:  chronically ill male, mild distress, laying in bed HENT: NCAT  Lungs: diminished breath sounds, rapid breaths Cardiovascular: rrr Abdomen: soft, non-tender, nondistended Extremities: decrease muscle mass, no edema Neuro: AAOx3  GU: defer  CTA PE Study 04/28/24 1. No CT evidence for acute pulmonary embolus. 2. Increased consolidative lung disease in the parahilar and suprahilar left lung, now incorporating the cavitary lesion seen previously. The cavitary component towards the left lung apex now measures 5.8 x 3.9 cm compared to 6.6 x 4.9 cm previously. There is a new air-fluid level within this collection on today's study. The more caudal thick walled component of this cystic lesion seen previously has become obscured inferiorly by adjacent lung collapse/consolidation. 3. Worsening dependent collapse/consolidation of both lower lobes with worsening of the now moderate bilateral pleural effusions. 4. Similar appearance of mild mediastinal lymphadenopathy comparing to the CT scan from just over 1 week ago. 5. Nodular liver contour suggests cirrhosis. 6. Aortic Atherosclerosis (ICD10-I70.0) and Emphysema (ICD10-J43.9).  Resolved problem list   Assessment and Plan   Acute Hypoxemic Respiratory Failure Persistent LUL cavitary lung lesion Acinetobacter Pneumonia Acute Heart Failure with pulmonary edema and bilateral pleural effusions Aspiration Risk, High Alcohol Use Disorder Multifocal ASD  Suspected dysphagia  EPEC/Shigella gastroenteritis  P - Acinetobacter growth from BAL culture - Continue meropenem . Changed from unasyn  due to salt load with heart failure - Continue diuresis as able. CT Chest negative for PE. Given bilateral pleural effusions and elevated  BNP, more concern for acute heart failure. Echo pending for further evaluation. Troponins are flat, low concern for ACS.   Labs   CBC: Recent Labs  Lab 04/22/24 0445 04/23/24 2358 04/26/24 0318 04/27/24 1039  04/28/24 0240  WBC 8.1 10.4 5.1 5.8 3.3*  NEUTROABS 4.8  --   --   --   --   HGB 8.5* 9.5* 8.5* 9.4* 8.9*  HCT 25.4* 29.8* 25.1* 29.9* 27.2*  MCV 98.4 100.7* 99.6 104.9* 101.1*  PLT 276 339 343 367 304    Basic Metabolic Panel: Recent Labs  Lab 04/22/24 0445 04/23/24 2358 04/26/24 0318 04/27/24 1039 04/28/24 0240  NA 134* 133* 142 142 143  K 4.2 4.2 3.4* 4.2 4.2  CL 103 103 108 110 112*  CO2 19* 21* 20* 18* 20*  GLUCOSE 84 125* 135* 115* 145*  BUN 8 8 8 11 13   CREATININE 0.51* 0.71 0.66 0.74 0.82  CALCIUM  7.9* 7.9* 8.2* 8.6* 8.7*  MG  --  1.8 1.7 2.0 2.0  PHOS  --  4.0 3.1 4.4 4.5   GFR: Estimated Creatinine Clearance: 76 mL/min (by C-G formula based on SCr of 0.82 mg/dL). Recent Labs  Lab 04/22/24 0445 04/23/24 2358 04/26/24 0318 04/27/24 1039 04/27/24 1729 04/27/24 1933 04/27/24 2233 04/28/24 0240  PROCALCITON 0.12  --   --  <0.10  --   --   --   --   WBC 8.1 10.4 5.1 5.8  --   --   --  3.3*  LATICACIDVEN  --  1.8  --   --  3.6* 3.4* 2.7*  --     Liver Function Tests: Recent Labs  Lab 04/22/24 0445  AST 26  ALT 20  ALKPHOS 53  BILITOT 0.4  PROT 4.7*  ALBUMIN 1.6*   No results for input(s): LIPASE, AMYLASE in the last 168 hours. Recent Labs  Lab 04/22/24 0445  AMMONIA 24    ABG    Component Value Date/Time   PHART 7.41 04/27/2024 1353   PCO2ART 29 (L) 04/27/2024 1353   PO2ART 68 (L) 04/27/2024 1353   HCO3 18.4 (L) 04/27/2024 1353   TCO2 20 (L) 04/15/2024 2043   ACIDBASEDEF 5.0 (H) 04/27/2024 1353   O2SAT 94.2 04/27/2024 1353     Coagulation Profile: No results for input(s): INR, PROTIME in the last 168 hours.   Cardiac Enzymes: No results for input(s): CKTOTAL, CKMB, CKMBINDEX, TROPONINI in the last 168 hours.  HbA1C: Hgb A1c MFr Bld  Date/Time Value Ref Range Status  04/16/2024 11:01 AM 4.1 (L) 4.8 - 5.6 % Final    Comment:    (NOTE) Diagnosis of Diabetes The following HbA1c ranges recommended by the  American Diabetes Association (ADA) may be used as an aid in the diagnosis of diabetes mellitus.  Hemoglobin             Suggested A1C NGSP%              Diagnosis  <5.7                   Non Diabetic  5.7-6.4                Pre-Diabetic  >6.4                   Diabetic  <7.0                   Glycemic control for  adults with diabetes.    10/13/2020 02:08 PM 5.5 4.8 - 5.6 % Final    Comment:             Prediabetes: 5.7 - 6.4          Diabetes: >6.4          Glycemic control for adults with diabetes: <7.0     CBG: Recent Labs  Lab 04/26/24 2122 04/27/24 0758 04/27/24 1134 04/27/24 1643 04/27/24 2141  GLUCAP 95 109* 109* 129* 149*       Critical care time: na    Dorn Chill, MD [facility] Office: [phone]   See Amion for personal pager PCCM on call pager [phone] until 7pm. Please call Elink 7p-7a. [phone]

## 2024-04-28 NOTE — Plan of Care (Signed)

## 2024-04-28 NOTE — Plan of Care (Signed)
  Problem: Education: Goal: Knowledge of General Education information will improve Description: Including pain rating scale, medication(s)/side effects and non-pharmacologic comfort measures Outcome: Progressing   Problem: Health Behavior/Discharge Planning: Goal: Ability to manage health-related needs will improve Outcome: Progressing   Problem: Clinical Measurements: Goal: Will remain free from infection Outcome: Progressing Goal: Diagnostic test results will improve Outcome: Progressing Goal: Respiratory complications will improve Outcome: Progressing Goal: Cardiovascular complication will be avoided Outcome: Progressing   Problem: Activity: Goal: Risk for activity intolerance will decrease Outcome: Progressing   Problem: Nutrition: Goal: Adequate nutrition will be maintained Outcome: Progressing   Problem: Elimination: Goal: Will not experience complications related to bowel motility Outcome: Progressing

## 2024-04-28 NOTE — Progress Notes (Addendum)
 PROGRESS NOTE                                                                                                                                                                                                             Patient Demographics:    Cody Glenn, is a 68 y.o. male, DOB - Jul 14, 1956, FMW:[id number]  Outpatient Primary MD for the patient is [name], MD    LOS - 12  Admit date - 04/15/2024    Chief Complaint  Patient presents with   Hypoglycemia   Alcohol Intoxication       Brief Narrative (HPI from H&P)      68 y.o. year old male with past medical history of heavy alcohol use, DVT, PUD, history of partial gastrectomy, active smoking, prediabetes, primary hypertension, cervical spinal stenosis, and homelessness.  He presents to [name], ED after being found by a bystander in a parking lot.  On EMS arrival patient was hypoglycemic and was given an amp of D50.  In the ER workup consistent with hyponatremia, dehydration with multiple electrolyte abnormalities.  Chest x-ray also showed left upper lobe cavitary lesion.   Subjective:   Patient still reports dyspnea this morning, some cough as well.   Assessment  & Plan :   Alcohol abuse, with withdrawals -On presentation, treated with CIWA protocol.  Now off withdrawal window, will DC CIWA - Continue with thiamine , folic acid  and multivitamin  Dehydration Hyponatremia Hypomagnesemia Hypophosphatemia - Due to above, continue to monitor closely and replete as needed  Sepsis, POA Multifocal pneumonia Persistent left upper lung cavitary lesions -  Homeless, alcoholic, high risk for chronic aspiration, sepsis workup initiated, IV fluids, pancultured, CT chest confirms left-sided cavitary lesion questionable abscess, he has had pulmonary evaluation for the same few weeks ago and had received antibiotics at that time. - Seen by PCCM, BAL cultures finally  came back  ACINETOBACTER CALCOACETICUS/BAUMANNII COMPLEX recommendations for 2 weeks of IV Unasyn , started 9/10, stop date 9/24. - Encouraged to use incentive spirometry and flutter valve  Acute on chronic CHF with a preserved EF Acute hypoxemic respiratory failure (pO2 of 68) Bilateral pleural effusion Lactic acidosis -Echo in July 2025 with preserved EF, undetermined diastolic dysfunction -He is with some dyspnea, repeat x-ray this morning significant for volume overload, BNP significantly elevated up from [phone] - Will  start on low-dose diuresis -Will repeat 2D echo to ensure no new drop in EF or valve disease given significant rapid development of his symptoms - EKG and troponins are reassuring - Bilateral pleural effusion likely the setting of CHF, continue with diuresis, would consider thoracentesis if no improvement with aggressive diuresis  Shigella gastroenteritis with diarrhea. - 7 days of azithromycin .  Protein Calorie Malnutrition   -Dietitian consulted. -Will start on mirtazapine     Hospital delirium - Patient improved this morning, not restless or agitated anymore, will stop Seroquel  and start on Remeron   Tobacco Use Disorder - Patient counseled on importance of cessation - Nicotine  replacement therapy with nicotine  patch and gum while inpatient  Normocytic Anemia Stable   COPD Not on any outpatient maintenance inhalers -PRN Albuterol    Hypertension - Patient reports he is not taking Diovan  - BP normotensive, monitor   #Peptic Ulcer Disease - Protonix    #Chronic Neck Pain - Lidocaine  patch - PRN toradol   Hypokalemia - Replaced  #Normocytic Anemia Stable   #COPD Not on any outpatient maintenance inhalers -PRN Albuterol    #Hypertension - Patient reports he is not taking Diovan  - BP normotensive, monitor     #Peptic Ulcer Disease - Protonix    #Chronic Neck Pain - Lidocaine  patch - PRN toradol   ## Mildly elevated free T4 levels with  stable TSH.  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    Lab Results  Component Value Date   HGBA1C 4.1 (L) 04/16/2024   CBG (last 3)  Recent Labs    04/27/24 2141 04/28/24 0822 04/28/24 1305  GLUCAP 149* 100* 122*        Condition - Extremely Guarded  Family Communication  : None present  Code Status : Full code  Consults  : Pulmonary, nephrology  PUD Prophylaxis :  PPI   Procedures  :     Date:04/19/24    Provider Performing:[name]    Procedure(s):  Flexible bronchoscopy with bronchial alveolar lavage   CT chest with IV contrast ordered 04/17/2024 - 1. Increasing size of the thick walled cavitary lesion within the left upper lobe, consistent with progressive cavitary pneumonia. While the soft tissue nodule within the inferior aspect of the lesion is no longer visualized, there has been marked progression in the mural thickening seen along the inferior extent, consistent with progressive infection. There is communication between this cavitary lesion and a subsegmental left upper lobe bronchus as described above. 2. Worsening ground-glass airspace disease within the lingula and left lower lobe, compatible with worsening infection. 3. Trace bilateral pleural effusions, left greater than right. 4. Aortic Atherosclerosis (ICD10-I70.0) and Emphysema (ICD10-J43.9).       Disposition Plan  :    Status is: Inpatient  DVT Prophylaxis  :    heparin  injection 5,000 Units Start: 04/17/24 1400 Place and maintain sequential compression device Start: 04/16/24 1157    Lab Results  Component Value Date   PLT 304 04/28/2024    Diet :  Diet Order             Diet regular Room service appropriate? Yes with Assist; Fluid consistency: Thin  Diet effective  now  Inpatient Medications  Scheduled Meds:  vitamin B-12  1,000 mcg Oral Daily   feeding supplement  1 Container Oral TID BM   folic acid   1 mg Oral Daily   furosemide   40 mg Intravenous BID   heparin  injection (subcutaneous)  5,000 Units Subcutaneous Q8H   insulin  aspart  0-9 Units Subcutaneous TID WC   ipratropium-albuterol   3 mL Nebulization QID   lidocaine   1 patch Transdermal Q24H   methylPREDNISolone  (SOLU-MEDROL ) injection  125 mg Intravenous Daily   mirtazapine   7.5 mg Oral QHS   multivitamin with minerals  1 tablet Oral Daily   nicotine   14 mg Transdermal Daily   pantoprazole   40 mg Oral Daily   sodium bicarbonate   650 mg Oral BID   [START ON 04/29/2024] thiamine   100 mg Oral Daily   Or   [START ON 04/29/2024] thiamine   100 mg Intravenous Daily   Continuous Infusions:  meropenem  (MERREM ) IV 1 g (04/28/24 0508)   PRN Meds:.acetaminophen  **OR** acetaminophen , dextrose , haloperidol  lactate, levalbuterol , melatonin, nicotine  polacrilex, ondansetron  **OR** ondansetron  (ZOFRAN ) IV, polyethylene glycol  Antibiotics  :    Anti-infectives (From admission, onward)    Start     Dose/Rate Route Frequency Ordered Stop   04/27/24 1800  meropenem  (MERREM ) 1 g in sodium chloride  0.9 % 100 mL IVPB        1 g 200 mL/hr over 30 Minutes Intravenous Every 8 hours 04/27/24 1526 05/01/24 2359   04/22/24 1045  linezolid  (ZYVOX ) tablet 600 mg  Status:  Discontinued        600 mg Oral Every 12 hours 04/22/24 0948 04/25/24 0947   04/18/24 1100  azithromycin  (ZITHROMAX ) tablet 500 mg        500 mg Oral Daily 04/18/24 1002 04/24/24 0921   04/17/24 1200  Ampicillin -Sulbactam (UNASYN ) 3 g in sodium chloride  0.9 % 100 mL IVPB  Status:  Discontinued        3 g 200 mL/hr over 30 Minutes Intravenous Every 6 hours 04/17/24 1025 04/27/24 1526         Objective:   Vitals:   04/28/24 0536 04/28/24 0823 04/28/24 1110 04/28/24 1200  BP: (!) 128/99 (!) 136/104  (!) 123/94   Pulse: (!) 105 (!) 115  (!) 105  Resp:  20  18  Temp: (!) 97.5 F (36.4 C) 97.6 F (36.4 C)  98.1 F (36.7 C)  TempSrc: Oral Oral  Oral  SpO2: 100%  100%   Weight:      Height:        Wt Readings from Last 3 Encounters:  04/19/24 68 kg  03/18/24 69 kg  02/20/24 69 kg     Intake/Output Summary (Last 24 hours) at 04/28/2024 1325 Last data filed at 04/28/2024 0519 Gross per 24 hour  Intake --  Output 300 ml  Net -300 ml       Physical Exam  Awake Alert, frail, ill-appearing Symmetrical Chest wall movement, diminished breath sounds, mildly tachypneic RRR,No Gallops,Rubs or new Murmurs, No Parasternal Heave +ve B.Sounds, Abd Soft, No tenderness, No rebound - guarding or rigidity. No Cyanosis, Clubbing or edema, No new Rash or bruise           Data Review:    Recent Labs  Lab 04/22/24 0445 04/23/24 2358 04/26/24 0318 04/27/24 1039 04/28/24 0240  WBC 8.1 10.4 5.1 5.8 3.3*  HGB 8.5* 9.5* 8.5* 9.4* 8.9*  HCT 25.4* 29.8* 25.1* 29.9* 27.2*  PLT 276 339 343 367 304  MCV  98.4 100.7* 99.6 104.9* 101.1*  MCH 32.9 32.1 33.7 33.0 33.1  MCHC 33.5 31.9 33.9 31.4 32.7  RDW 13.5 13.8 14.1 14.6 14.7  LYMPHSABS 2.1  --   --   --   --   MONOABS 0.8  --   --   --   --   EOSABS 0.4  --   --   --   --   BASOSABS 0.1  --   --   --   --     Recent Labs  Lab 04/22/24 0445 04/23/24 2358 04/26/24 0318 04/27/24 1039 04/27/24 1729 04/27/24 1933 04/27/24 2233 04/28/24 0240  NA 134* 133* 142 142  --   --   --  143  K 4.2 4.2 3.4* 4.2  --   --   --  4.2  CL 103 103 108 110  --   --   --  112*  CO2 19* 21* 20* 18*  --   --   --  20*  ANIONGAP 12 9 14 14   --   --   --  11  GLUCOSE 84 125* 135* 115*  --   --   --  145*  BUN 8 8 8 11   --   --   --  13  CREATININE 0.51* 0.71 0.66 0.74  --   --   --  0.82  AST 26  --   --   --   --   --   --   --   ALT 20  --   --   --   --   --   --   --   ALKPHOS 53  --   --   --   --   --   --   --   BILITOT 0.4  --   --   --   --   --    --   --   ALBUMIN 1.6*  --   --   --   --   --   --   --   DDIMER  --   --   --   --   --  1.09*  --   --   PROCALCITON 0.12  --   --  <0.10  --   --   --   --   LATICACIDVEN  --  1.8  --   --  3.6* 3.4* 2.7*  --   AMMONIA 24  --   --   --   --   --   --   --   BNP  --   --   --  3,496.3*  --   --   --  3,842.6*  MG  --  1.8 1.7 2.0  --   --   --  2.0  PHOS  --  4.0 3.1 4.4  --   --   --  4.5  CALCIUM  7.9* 7.9* 8.2* 8.6*  --   --   --  8.7*      Recent Labs  Lab 04/22/24 0445 04/23/24 2358 04/26/24 0318 04/27/24 1039 04/27/24 1729 04/27/24 1933 04/27/24 2233 04/28/24 0240  DDIMER  --   --   --   --   --  1.09*  --   --   PROCALCITON 0.12  --   --  <0.10  --   --   --   --   LATICACIDVEN  --  1.8  --   --  3.6* 3.4* 2.7*  --   AMMONIA 24  --   --   --   --   --   --   --   BNP  --   --   --  3,496.3*  --   --   --  3,842.6*  MG  --  1.8 1.7 2.0  --   --   --  2.0  CALCIUM  7.9* 7.9* 8.2* 8.6*  --   --   --  8.7*    Lab Results  Component Value Date   CHOL 213 (H) 10/13/2020   HDL 106 10/13/2020   LDLCALC 93 10/13/2020   TRIG 84 10/13/2020   CHOLHDL 2.0 10/13/2020    Lab Results  Component Value Date   HGBA1C 4.1 (L) 04/16/2024   No results for input(s): TSH, T4TOTAL, FREET4, T3FREE, THYROIDAB in the last 72 hours.  No results for input(s): VITAMINB12, FOLATE, FERRITIN, TIBC, IRON, RETICCTPCT in the last 72 hours. ------------------------------------------------------------------------------------------------------------------ Cardiac Enzymes No results for input(s): CKMB, TROPONINI, MYOGLOBIN in the last 168 hours.  Invalid input(s): CK  Micro Results Recent Results (from the past 240 hours)  Fungus Culture With Stain     Status: None (Preliminary result)   Collection Time: 04/19/24 12:38 PM   Specimen: Bronchial Alveolar Lavage; Respiratory  Result Value Ref Range Status   Fungus Stain Final report  Final    Comment:  (NOTE) Performed At: [facility] [address] [place], [place] [id number] Jennette Shorter MD Ey:[id number]    Fungus (Mycology) Culture PENDING  Incomplete   Fungal Source BRONCHIAL ALVEOLAR LAVAGE  Final    Comment: Performed at [facility], [address] [place], [place] [address]  Culture, BAL-quantitative w Gram Stain     Status: Abnormal (Preliminary result)   Collection Time: 04/19/24 12:38 PM   Specimen: Bronchial Alveolar Lavage; Respiratory  Result Value Ref Range Status   Specimen Description BRONCHIAL ALVEOLAR LAVAGE  Final   Special Requests NONE  Final   Gram Stain NO WBC SEEN NO ORGANISMS SEEN   Final   Culture (A)  Final    2,000 COLONIES/mL ACINETOBACTER CALCOACETICUS/BAUMANNII COMPLEX CULTURE REINCUBATED FOR BETTER GROWTH Performed at [facility], [address], [place], [place] [address]    Report Status PENDING  Incomplete   Organism ID, Bacteria ACINETOBACTER CALCOACETICUS/BAUMANNII COMPLEX (A)  Final      Susceptibility   Acinetobacter calcoaceticus/baumannii complex - MIC*    PIP/TAZO Value in next row Resistant      >=128 RESISTANTThis is a modified FDA-approved test that has been validated and its performance characteristics determined by the reporting laboratory.  This laboratory is certified under the Clinical Laboratory Improvement Amendments CLIA as qualified to perform high complexity clinical laboratory testing.    AMPICILLIN /SULBACTAM Value in next row Sensitive      >=128 RESISTANTThis is a modified FDA-approved test that has been validated and its performance characteristics determined by the reporting laboratory.  This laboratory is certified under the Clinical Laboratory Improvement Amendments CLIA as qualified to perform high complexity clinical laboratory testing.    MEROPENEM  Value in next row Sensitive      >=128 RESISTANTThis is a modified FDA-approved test that has been validated and its performance characteristics  determined by the reporting laboratory.  This laboratory is certified under the Clinical Laboratory Improvement Amendments CLIA as qualified to perform high complexity clinical laboratory testing.    * 2,000 COLONIES/mL ACINETOBACTER CALCOACETICUS/BAUMANNII COMPLEX  Acid Fast Smear (  AFB)     Status: None   Collection Time: 04/19/24 12:38 PM   Specimen: Bronchial Alveolar Lavage; Respiratory  Result Value Ref Range Status   AFB Specimen Processing Concentration  Final   Acid Fast Smear Negative  Final    Comment: (NOTE) Performed At: [facility] [address] [place], [place] [id number] Jennette Shorter MD Ey:[id number]    Source (AFB) BRONCHIAL ALVEOLAR LAVAGE  Final    Comment: Performed at [facility], [address] [place], [place] [address]  Fungus Culture Result     Status: None   Collection Time: 04/19/24 12:38 PM  Result Value Ref Range Status   Result 1 Comment  Final    Comment: (NOTE) KOH/Calcofluor preparation:  no fungus observed. Performed At: [facility] [address] [place], [place] [id number] Jennette Shorter MD Ey:[id number]     Radiology Report CT Angio Chest Pulmonary Embolism (PE) W or WO Contrast Result Date: 04/28/2024 CLINICAL DATA:  Hypoglycemia. Clinical concern for pulmonary embolism. EXAM: CT ANGIOGRAPHY CHEST WITH CONTRAST TECHNIQUE: Multidetector CT imaging of the chest was performed using the standard protocol during bolus administration of intravenous contrast. Multiplanar CT image reconstructions and MIPs were obtained to evaluate the vascular anatomy. RADIATION DOSE REDUCTION: This exam was performed according to the departmental dose-optimization program which includes automated exposure control, adjustment of the mA and/or kV according to patient size and/or use of iterative reconstruction technique. CONTRAST:  75mL OMNIPAQUE  IOHEXOL  350 MG/ML SOLN COMPARISON:  Chest CT with contrast 04/17/2024 FINDINGS: Cardiovascular: The  heart is enlarged. No substantial pericardial effusion. Coronary artery calcification is evident. Mild atherosclerotic calcification is noted in the wall of the thoracic aorta. Ascending thoracic aorta measures up to 4.5 cm diameter. Enlargement of the pulmonary outflow tract/main pulmonary arteries suggests pulmonary arterial hypertension. There is no filling defect within the opacified pulmonary arteries to suggest the presence of an acute pulmonary embolus. Mediastinum/Nodes: Similar appearance of mild mediastinal lymphadenopathy comparing to the CT scan from just over 1 week ago. Soft tissue attenuation in the hilar regions bilaterally compatible with central lung collapse/consolidation. The esophagus has normal imaging features. There is no axillary lymphadenopathy. Lungs/Pleura: Centrilobular and paraseptal emphysema evident. Increased consolidative lung disease in the parahilar and suprahilar left lung, now incorporating the cavitary lesion seen previously. The cavitary component towards the left lung apex now measures 5.8 x 3.9 cm compared to 6.6 x 4.9 cm previously. There is a new air-fluid level within this collection on today's study. The more caudal thick walled component of this cystic lesion seen previously has become obscured inferiorly by adjacent lung collapse/consolidation. There is worsening dependent collapse/consolidation of both lower lobes. Moderate bilateral pleural effusions evident. Upper Abdomen: Nodular liver contour suggests cirrhosis. The visualized portion of the upper abdomen is otherwise unremarkable. Musculoskeletal: No worrisome lytic or sclerotic osseous abnormality. Review of the MIP images confirms the above findings. IMPRESSION: 1. No CT evidence for acute pulmonary embolus. 2. Increased consolidative lung disease in the parahilar and suprahilar left lung, now incorporating the cavitary lesion seen previously. The cavitary component towards the left lung apex now measures 5.8 x  3.9 cm compared to 6.6 x 4.9 cm previously. There is a new air-fluid level within this collection on today's study. The more caudal thick walled component of this cystic lesion seen previously has become obscured inferiorly by adjacent lung collapse/consolidation. 3. Worsening dependent collapse/consolidation of both lower lobes with worsening of the now moderate bilateral pleural effusions. 4. Similar appearance of mild  mediastinal lymphadenopathy comparing to the CT scan from just over 1 week ago. 5. Nodular liver contour suggests cirrhosis. 6. Aortic Atherosclerosis (ICD10-I70.0) and Emphysema (ICD10-J43.9). Electronically Signed   By: Camellia Candle M.D.   On: 04/28/2024 08:44   DG Chest Port 1 View Result Date: 04/26/2024 CLINICAL DATA:  Hypoxia EXAM: PORTABLE CHEST 1 VIEW COMPARISON:  04/23/2024 FINDINGS: Stable cardiac silhouette. Severe diffuse bilateral airspace disease and interstitial opacities, worsening since prior study. Small bilateral pleural effusions, stable. IMPRESSION: Worsening bilateral interstitial and airspace opacities, likely worsening edema or infection. Small bilateral effusions, stable. Electronically Signed   By: Franky Crease M.D.   On: 04/26/2024 20:16       Signature  -   Brayton Dovie Kapusta M.D on 04/28/2024 at 1:25 PM   -  To page go to [email or web address]

## 2024-04-29 ENCOUNTER — Inpatient Hospital Stay (HOSPITAL_COMMUNITY)

## 2024-04-29 ENCOUNTER — Encounter (HOSPITAL_COMMUNITY): Payer: Self-pay | Admitting: Internal Medicine

## 2024-04-29 DIAGNOSIS — F1090 Alcohol use, unspecified, uncomplicated: Secondary | ICD-10-CM

## 2024-04-29 DIAGNOSIS — I5021 Acute systolic (congestive) heart failure: Secondary | ICD-10-CM

## 2024-04-29 DIAGNOSIS — R0609 Other forms of dyspnea: Secondary | ICD-10-CM

## 2024-04-29 DIAGNOSIS — J189 Pneumonia, unspecified organism: Secondary | ICD-10-CM | POA: Diagnosis not present

## 2024-04-29 DIAGNOSIS — F1010 Alcohol abuse, uncomplicated: Secondary | ICD-10-CM | POA: Diagnosis not present

## 2024-04-29 DIAGNOSIS — R9431 Abnormal electrocardiogram [ECG] [EKG]: Secondary | ICD-10-CM | POA: Insufficient documentation

## 2024-04-29 DIAGNOSIS — J90 Pleural effusion, not elsewhere classified: Secondary | ICD-10-CM | POA: Diagnosis not present

## 2024-04-29 DIAGNOSIS — I5020 Unspecified systolic (congestive) heart failure: Secondary | ICD-10-CM | POA: Diagnosis not present

## 2024-04-29 DIAGNOSIS — I4581 Long QT syndrome: Secondary | ICD-10-CM | POA: Diagnosis not present

## 2024-04-29 DIAGNOSIS — J9601 Acute respiratory failure with hypoxia: Secondary | ICD-10-CM | POA: Diagnosis not present

## 2024-04-29 LAB — GLUCOSE, CAPILLARY
Glucose-Capillary: 86 mg/dL (ref 70–99)
Glucose-Capillary: 77 mg/dL (ref 70–99)
Glucose-Capillary: 80 mg/dL (ref 70–99)
Glucose-Capillary: 63 mg/dL — ABNORMAL LOW (ref 70–99)
Glucose-Capillary: 128 mg/dL — ABNORMAL HIGH (ref 70–99)

## 2024-04-29 LAB — BASIC METABOLIC PANEL WITH GFR
Anion gap: 14 (ref 5–15)
BUN: 17 mg/dL (ref 8–23)
CO2: 22 mmol/L (ref 22–32)
Calcium: 8.6 mg/dL — ABNORMAL LOW (ref 8.9–10.3)
Chloride: 108 mmol/L (ref 98–111)
Creatinine, Ser: 0.78 mg/dL (ref 0.61–1.24)
GFR, Estimated: >60 mL/min (ref >60)
Glucose, Bld: 117 mg/dL — ABNORMAL HIGH (ref 70–99)
Potassium: 3.7 mmol/L (ref 3.5–5.1)
Sodium: 144 mmol/L (ref 135–145)

## 2024-04-29 LAB — TROPONIN I (HIGH SENSITIVITY)
Troponin I (High Sensitivity): 36 ng/L — ABNORMAL HIGH (ref <18)
Troponin I (High Sensitivity): 61 ng/L — ABNORMAL HIGH (ref <18)

## 2024-04-29 LAB — CBC
HCT: 27.3 % — ABNORMAL LOW (ref 39.0–52.0)
Hemoglobin: 8.8 g/dL — ABNORMAL LOW (ref 13.0–17.0)
MCH: 33.3 pg (ref 26.0–34.0)
MCHC: 32.2 g/dL (ref 30.0–36.0)
MCV: 103.4 fL — ABNORMAL HIGH (ref 80.0–100.0)
Platelets: 327 K/uL (ref 150–400)
RBC: 2.64 MIL/uL — ABNORMAL LOW (ref 4.22–5.81)
RDW: 15.3 % (ref 11.5–15.5)
WBC: 6 K/uL (ref 4.0–10.5)
nRBC: 0.3 % — ABNORMAL HIGH (ref 0.0–0.2)

## 2024-04-29 LAB — BODY FLUID CELL COUNT WITH DIFFERENTIAL
Eos, Fluid: 1 %
Lymphs, Fluid: 77 %
Monocyte-Macrophage-Serous Fluid: 9 % — ABNORMAL LOW (ref 50–90)
Neutrophil Count, Fluid: 13 % (ref 0–25)
Total Nucleated Cell Count, Fluid: 299 uL (ref 0–1000)

## 2024-04-29 LAB — PROTEIN, PLEURAL OR PERITONEAL FLUID: Total protein, fluid: <3 g/dL

## 2024-04-29 LAB — LACTATE DEHYDROGENASE, PLEURAL OR PERITONEAL FLUID: LD, Fluid: 34 U/L — ABNORMAL HIGH (ref 3–23)

## 2024-04-29 LAB — LACTATE DEHYDROGENASE: LDH: 185 U/L (ref 98–192)

## 2024-04-29 LAB — ECHOCARDIOGRAM COMPLETE
Calc EF: 17.5 %
Est EF: <20
Height: 65 in
S' Lateral: 4.46 cm
Single Plane A2C EF: 17.9 %
Single Plane A4C EF: 19.2 %
Weight: 2398.6 [oz_av]

## 2024-04-29 LAB — BRAIN NATRIURETIC PEPTIDE: B Natriuretic Peptide: 4294.9 pg/mL — ABNORMAL HIGH (ref 0.0–100.0)

## 2024-04-29 LAB — GLUCOSE, PLEURAL OR PERITONEAL FLUID: Glucose, Fluid: 142 mg/dL

## 2024-04-29 LAB — PROTEIN, TOTAL: Total Protein: 6.7 g/dL (ref 6.5–8.1)

## 2024-04-29 LAB — MAGNESIUM: Magnesium: 2 mg/dL (ref 1.7–2.4)

## 2024-04-29 LAB — PHOSPHORUS: Phosphorus: 5.2 mg/dL — ABNORMAL HIGH (ref 2.5–4.6)

## 2024-04-29 MED ORDER — POTASSIUM CHLORIDE CRYS ER 20 MEQ PO TBCR
20.0000 meq | EXTENDED_RELEASE_TABLET | Freq: Two times a day (BID) | ORAL | Status: DC
  Administered 2024-04-29 – 2024-05-01 (×5): 20 meq via ORAL
  Filled 2024-04-29 (×5): qty 1

## 2024-04-29 MED ORDER — HEPARIN SODIUM (PORCINE) 5000 UNIT/ML IJ SOLN
5000.0000 [IU] | Freq: Three times a day (TID) | INTRAMUSCULAR | Status: DC
  Administered 2024-04-29 – 2024-05-01 (×5): 5000 [IU] via SUBCUTANEOUS
  Filled 2024-04-29 (×4): qty 1

## 2024-04-29 MED ORDER — FUROSEMIDE 10 MG/ML IJ SOLN
80.0000 mg | Freq: Two times a day (BID) | INTRAMUSCULAR | Status: DC
  Administered 2024-04-29 – 2024-05-01 (×6): 80 mg via INTRAVENOUS
  Filled 2024-04-29 (×6): qty 8

## 2024-04-29 MED ORDER — POTASSIUM CHLORIDE CRYS ER 20 MEQ PO TBCR
40.0000 meq | EXTENDED_RELEASE_TABLET | Freq: Once | ORAL | Status: AC
  Administered 2024-04-29: 40 meq via ORAL
  Filled 2024-04-29: qty 2

## 2024-04-29 MED ORDER — PERFLUTREN LIPID MICROSPHERE
1.0000 mL | INTRAVENOUS | Status: AC | PRN
  Administered 2024-04-29: 2 mL via INTRAVENOUS

## 2024-04-29 NOTE — Plan of Care (Signed)
  Problem: Education: Goal: Knowledge of General Education information will improve Description: Including pain rating scale, medication(s)/side effects and non-pharmacologic comfort measures Outcome: Progressing   Problem: Health Behavior/Discharge Planning: Goal: Ability to manage health-related needs will improve Outcome: Progressing   Problem: Clinical Measurements: Goal: Ability to maintain clinical measurements within normal limits will improve Outcome: Progressing Goal: Will remain free from infection Outcome: Progressing Goal: Diagnostic test results will improve Outcome: Progressing Goal: Respiratory complications will improve Outcome: Progressing Goal: Cardiovascular complication will be avoided Outcome: Progressing   Problem: Activity: Goal: Risk for activity intolerance will decrease Outcome: Progressing   Problem: Nutrition: Goal: Adequate nutrition will be maintained Outcome: Progressing   Problem: Coping: Goal: Level of anxiety will decrease Outcome: Progressing   Problem: Elimination: Goal: Will not experience complications related to bowel motility Outcome: Progressing Goal: Will not experience complications related to urinary retention Outcome: Progressing   Problem: Pain Managment: Goal: General experience of comfort will improve and/or be controlled Outcome: Progressing   Problem: Safety: Goal: Ability to remain free from injury will improve Outcome: Progressing   Problem: Skin Integrity: Goal: Risk for impaired skin integrity will decrease Outcome: Progressing   Problem: Education: Goal: Ability to describe self-care measures that may prevent or decrease complications (Diabetes Survival Skills Education) will improve Outcome: Progressing Goal: Individualized Educational Video(s) Outcome: Progressing   Problem: Coping: Goal: Ability to adjust to condition or change in health will improve Outcome: Progressing   Problem: Fluid  Volume: Goal: Ability to maintain a balanced intake and output will improve Outcome: Progressing   Problem: Health Behavior/Discharge Planning: Goal: Ability to identify and utilize available resources and services will improve Outcome: Progressing Goal: Ability to manage health-related needs will improve Outcome: Progressing   Problem: Metabolic: Goal: Ability to maintain appropriate glucose levels will improve Outcome: Progressing   Problem: Nutritional: Goal: Maintenance of adequate nutrition will improve Outcome: Progressing Goal: Progress toward achieving an optimal weight will improve Outcome: Progressing   Problem: Skin Integrity: Goal: Risk for impaired skin integrity will decrease Outcome: Progressing   Problem: Tissue Perfusion: Goal: Adequacy of tissue perfusion will improve Outcome: Progressing   Problem: Safety: Goal: Non-violent Restraint(s) Outcome: Progressing   Problem: Education: Goal: Ability to demonstrate management of disease process will improve Outcome: Progressing Goal: Ability to verbalize understanding of medication therapies will improve Outcome: Progressing Goal: Individualized Educational Video(s) Outcome: Progressing   Problem: Activity: Goal: Capacity to carry out activities will improve Outcome: Progressing   Problem: Cardiac: Goal: Ability to achieve and maintain adequate cardiopulmonary perfusion will improve Outcome: Progressing

## 2024-04-29 NOTE — Progress Notes (Signed)
 PROGRESS NOTE                                                                                                                                                                                                             Patient Demographics:    Cody Glenn, is a 68 y.o. male, DOB - 02/01/1956, FMW:[id number]  Outpatient Primary MD for the patient is [name], MD    LOS - 13  Admit date - 04/15/2024    Chief Complaint  Patient presents with   Hypoglycemia   Alcohol Intoxication       Brief Narrative (HPI from H&P)      68 y.o. year old male with past medical history of heavy alcohol use, DVT, PUD, history of partial gastrectomy, active smoking, prediabetes, primary hypertension, cervical spinal stenosis, and homelessness.  He presents to [name], ED after being found by a bystander in a parking lot.  On EMS arrival patient was hypoglycemic and was given an amp of D50.  In the ER workup consistent with hyponatremia, dehydration with multiple electrolyte abnormalities.  Chest x-ray also showed left upper lobe cavitary lesion.  PCCM were consulted, underwent on Kos[place] P, and antibiotics adjusted according to culture, on 9/20 patient had rapid decompensation in respiratory status with workup significant for acute heart failure.   Significant Hospital Events:  9/9 admitted 9/10 pulm consult, GI panel positive for EPEC and Shigella 9/11 CT Surgery consult, no need for surgery at this time 9/12 bronchoscopy with BAL performed, BAL culture growing acinetobacter 9/20 PCCM re-consulted for acute worsening in respiratory status, concern for acute CHF exacerbation. Regular diet resumed 9/16 by Speech   Subjective:   Still reports dyspnea, chest discomfort this morning   Assessment  & Plan :    Acute on chronic CHF with a preserved EF Acute hypoxemic respiratory failure (pO2 of 68) Bilateral pleural effusion Lactic  acidosis -Echo in July 2025 with preserved EF, undetermined diastolic dysfunction - Developed significant dyspnea with evidence of volume overload on imaging and BNP significantly elevated . - CTA chest negative for PE . - Evidence of significant volume overload, on aggressive IV diuresis  - 2D echo was done this morning, but prelim reading by tech showing EF less than 20% so cardiology were consulted. - Recurrent bilateral pleural effusion, as discussed with pulmonary 9/21,  recommendation for aggressive diuresis then reassess, but I think likely he will end needing thoracentesis if no improvement. - Troponins non-ACS pattern [id number]> 35>  Alcohol abuse, with withdrawals -On presentation, treated with CIWA protocol.  Now off withdrawal window, will DC CIWA - Continue with thiamine , folic acid  and multivitamin  Dehydration Hyponatremia Hypomagnesemia Hypophosphatemia - Due to above, continue to monitor closely and replete as needed  Sepsis, POA Multifocal pneumonia Persistent left upper lung cavitary lesions -  Homeless, alcoholic, high risk for chronic aspiration, sepsis workup initiated, IV fluids, pancultured, CT chest confirms left-sided cavitary lesion questionable abscess, he has had pulmonary evaluation for the same few weeks ago and had received antibiotics at that time. - Seen by PCCM, BAL cultures finally came back  ACINETOBACTER CALCOACETICUS/BAUMANNII COMPLEX recommendations for 2 weeks of IV Unasyn , started 9/10, stop date 9/24. - Encouraged to use incentive spirometry and flutter valve  Shigella gastroenteritis with diarrhea. - 7 days of azithromycin .  Protein Calorie Malnutrition   -Dietitian consulted. -Will start on mirtazapine     Hospital delirium - Patient improved this morning, not restless or agitated anymore, will stop Seroquel  and start on Remeron   Tobacco Use Disorder - Patient counseled on importance of cessation - Nicotine  replacement therapy with nicotine   patch and gum while inpatient  Normocytic Anemia Stable   COPD Not on any outpatient maintenance inhalers -PRN Albuterol    Hypertension - Patient reports he is not taking Diovan  - BP normotensive, monitor   #Peptic Ulcer Disease - Protonix    #Chronic Neck Pain - Lidocaine  patch - PRN toradol   Hypokalemia - Replaced  #Normocytic Anemia Stable   #COPD Not on any outpatient maintenance inhalers -PRN Albuterol    #Hypertension - Patient reports he is not taking Diovan  - BP normotensive, monitor     #Peptic Ulcer Disease - Protonix    #Chronic Neck Pain - Lidocaine  patch - PRN toradol   ## Mildly elevated free T4 levels with stable TSH.  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    Lab Results  Component Value Date   HGBA1C 4.1 (L) 04/16/2024   CBG (last 3)  Recent Labs    04/28/24 1615 04/28/24 2208 04/29/24 0741  GLUCAP 165* 145* 86        Condition - Extremely Guarded  Family Communication  : None present, discussed with brother 9/21  Code Status : Full code  Consults  : Pulmonary, nephrology  PUD Prophylaxis :  PPI   Procedures  :     Date:04/19/24    Provider Performing:[name]    Procedure(s):  Flexible bronchoscopy with bronchial alveolar lavage   CT chest with IV contrast ordered 04/17/2024 - 1. Increasing size of the thick walled cavitary lesion within the left upper lobe, consistent with progressive cavitary pneumonia. While the soft tissue nodule within the inferior aspect of the lesion is no longer visualized, there has been marked progression in the mural thickening seen along the inferior extent, consistent with progressive infection. There is communication between this cavitary lesion and a subsegmental left  upper lobe bronchus as described above. 2. Worsening ground-glass airspace disease within the lingula and left lower lobe, compatible with worsening infection. 3. Trace bilateral pleural effusions, left greater than right. 4. Aortic Atherosclerosis (ICD10-I70.0) and Emphysema (  ICD10-J43.9).       Disposition Plan  :    Status is: Inpatient  DVT Prophylaxis  :    heparin  injection 5,000 Units Start: 04/17/24 1400 Place and maintain sequential compression device Start: 04/16/24 1157    Lab Results  Component Value Date   PLT 327 04/29/2024    Diet :  Diet Order             Diet regular Room service appropriate? Yes with Assist; Fluid consistency: Thin  Diet effective now                    Inpatient Medications  Scheduled Meds:  vitamin B-12  1,000 mcg Oral Daily   feeding supplement  1 Container Oral TID BM   folic acid   1 mg Oral Daily   furosemide   80 mg Intravenous BID   heparin  injection (subcutaneous)  5,000 Units Subcutaneous Q8H   insulin  aspart  0-9 Units Subcutaneous TID WC   lidocaine   1 patch Transdermal Q24H   mirtazapine   7.5 mg Oral QHS   multivitamin with minerals  1 tablet Oral Daily   nicotine   14 mg Transdermal Daily   pantoprazole   40 mg Oral Daily   thiamine   100 mg Oral Daily   Or   thiamine   100 mg Intravenous Daily   Continuous Infusions:  meropenem  (MERREM ) IV 1 g (04/29/24 0541)   PRN Meds:.acetaminophen  **OR** acetaminophen , dextrose , haloperidol  lactate, levalbuterol , melatonin, nicotine  polacrilex, ondansetron  **OR** ondansetron  (ZOFRAN ) IV, perflutren  lipid microspheres (DEFINITY ) IV suspension, polyethylene glycol  Antibiotics  :    Anti-infectives (From admission, onward)    Start     Dose/Rate Route Frequency Ordered Stop   04/27/24 1800  meropenem  (MERREM ) 1 g in sodium chloride  0.9 % 100 mL IVPB        1 g 200 mL/hr over 30 Minutes Intravenous Every 8 hours 04/27/24 1526 05/01/24 2359   04/22/24 1045  linezolid  (ZYVOX )  tablet 600 mg  Status:  Discontinued        600 mg Oral Every 12 hours 04/22/24 0948 04/25/24 0947   04/18/24 1100  azithromycin  (ZITHROMAX ) tablet 500 mg        500 mg Oral Daily 04/18/24 1002 04/24/24 0921   04/17/24 1200  Ampicillin -Sulbactam (UNASYN ) 3 g in sodium chloride  0.9 % 100 mL IVPB  Status:  Discontinued        3 g 200 mL/hr over 30 Minutes Intravenous Every 6 hours 04/17/24 1025 04/27/24 1526         Objective:   Vitals:   04/29/24 0700 04/29/24 0730 04/29/24 0733 04/29/24 0904  BP:  (!) 138/117    Pulse:      Resp:  (!) 26    Temp:  98 F (36.7 C)    TempSrc:   Oral   SpO2: 98%   100%  Weight:      Height:        Wt Readings from Last 3 Encounters:  04/19/24 68 kg  03/18/24 69 kg  02/20/24 69 kg     Intake/Output Summary (Last 24 hours) at 04/29/2024 1011 Last data filed at 04/29/2024 0457 Gross per 24 hour  Intake 350 ml  Output 1300 ml  Net -950 ml       Physical Exam  Awake, alert, extremely frail, ill-appearing Diminished air entry at the bases, coarse respiratory sounds left> right Tachycardic, no rubs or gallops +ve B.Sounds, Abd Soft, No tenderness, No rebound -  guarding or rigidity. No Cyanosis, Clubbing, trace edema, No new Rash or bruise           Data Review:    Recent Labs  Lab 04/23/24 2358 04/26/24 0318 04/27/24 1039 04/28/24 0240 04/29/24 0311  WBC 10.4 5.1 5.8 3.3* 6.0  HGB 9.5* 8.5* 9.4* 8.9* 8.8*  HCT 29.8* 25.1* 29.9* 27.2* 27.3*  PLT 339 343 367 304 327  MCV 100.7* 99.6 104.9* 101.1* 103.4*  MCH 32.1 33.7 33.0 33.1 33.3  MCHC 31.9 33.9 31.4 32.7 32.2  RDW 13.8 14.1 14.6 14.7 15.3    Recent Labs  Lab 04/23/24 2358 04/26/24 0318 04/27/24 1039 04/27/24 1729 04/27/24 1933 04/27/24 2233 04/28/24 0240 04/29/24 0311  NA 133* 142 142  --   --   --  143 144  K 4.2 3.4* 4.2  --   --   --  4.2 3.7  CL 103 108 110  --   --   --  112* 108  CO2 21* 20* 18*  --   --   --  20* 22  ANIONGAP 9 14 14   --   --   --   11 14  GLUCOSE 125* 135* 115*  --   --   --  145* 117*  BUN 8 8 11   --   --   --  13 17  CREATININE 0.71 0.66 0.74  --   --   --  0.82 0.78  DDIMER  --   --   --   --  1.09*  --   --   --   PROCALCITON  --   --  <0.10  --   --   --   --   --   LATICACIDVEN 1.8  --   --  3.6* 3.4* 2.7*  --   --   BNP  --   --  3,496.3*  --   --   --  3,842.6* 4,294.9*  MG 1.8 1.7 2.0  --   --   --  2.0 2.0  PHOS 4.0 3.1 4.4  --   --   --  4.5 5.2*  CALCIUM  7.9* 8.2* 8.6*  --   --   --  8.7* 8.6*      Recent Labs  Lab 04/23/24 2358 04/26/24 0318 04/27/24 1039 04/27/24 1729 04/27/24 1933 04/27/24 2233 04/28/24 0240 04/29/24 0311  DDIMER  --   --   --   --  1.09*  --   --   --   PROCALCITON  --   --  <0.10  --   --   --   --   --   LATICACIDVEN 1.8  --   --  3.6* 3.4* 2.7*  --   --   BNP  --   --  3,496.3*  --   --   --  3,842.6* 4,294.9*  MG 1.8 1.7 2.0  --   --   --  2.0 2.0  CALCIUM  7.9* 8.2* 8.6*  --   --   --  8.7* 8.6*    Lab Results  Component Value Date   CHOL 213 (H) 10/13/2020   HDL 106 10/13/2020   LDLCALC 93 10/13/2020   TRIG 84 10/13/2020   CHOLHDL 2.0 10/13/2020    Lab Results  Component Value Date   HGBA1C 4.1 (L) 04/16/2024   No results for input(s): TSH, T4TOTAL, FREET4, T3FREE, THYROIDAB in the last 72 hours.  No results for input(s): VITAMINB12, FOLATE,  FERRITIN, TIBC, IRON, RETICCTPCT in the last 72 hours. ------------------------------------------------------------------------------------------------------------------ Cardiac Enzymes No results for input(s): CKMB, TROPONINI, MYOGLOBIN in the last 168 hours.  Invalid input(s): CK  Micro Results Recent Results (from the past 240 hours)  Fungus Culture With Stain     Status: None (Preliminary result)   Collection Time: 04/19/24 12:38 PM   Specimen: Bronchial Alveolar Lavage; Respiratory  Result Value Ref Range Status   Fungus Stain Final report  Final    Comment:  (NOTE) Performed At: [facility] [address] [place], [place] [id number] Jennette Shorter MD Ey:[id number]    Fungus (Mycology) Culture PENDING  Incomplete   Fungal Source BRONCHIAL ALVEOLAR LAVAGE  Final    Comment: Performed at [facility], [address], [place], [place] [address]  Culture, BAL-quantitative w Gram Stain     Status: Abnormal (Preliminary result)   Collection Time: 04/19/24 12:38 PM   Specimen: Bronchial Alveolar Lavage; Respiratory  Result Value Ref Range Status   Specimen Description BRONCHIAL ALVEOLAR LAVAGE  Final   Special Requests NONE  Final   Gram Stain NO WBC SEEN NO ORGANISMS SEEN   Final   Culture (A)  Final    2,000 COLONIES/mL ACINETOBACTER CALCOACETICUS/BAUMANNII COMPLEX CULTURE REINCUBATED FOR BETTER GROWTH Performed at [facility], [address], [place], [place] [address]    Report Status PENDING  Incomplete   Organism ID, Bacteria ACINETOBACTER CALCOACETICUS/BAUMANNII COMPLEX (A)  Final      Susceptibility   Acinetobacter calcoaceticus/baumannii complex - MIC*    PIP/TAZO Value in next row Resistant      >=128 RESISTANTThis is a modified FDA-approved test that has been validated and its performance characteristics determined by the reporting laboratory.  This laboratory is certified under the Clinical Laboratory Improvement Amendments CLIA as qualified to perform high complexity clinical laboratory testing.    AMPICILLIN /SULBACTAM Value in next row Sensitive      >=128 RESISTANTThis is a modified FDA-approved test that has been validated and its performance characteristics determined by the reporting laboratory.  This laboratory is certified under the Clinical Laboratory Improvement Amendments CLIA as qualified to perform high complexity clinical laboratory testing.    MEROPENEM  Value in next row Sensitive      >=128 RESISTANTThis is a modified FDA-approved test that has been validated and its performance characteristics  determined by the reporting laboratory.  This laboratory is certified under the Clinical Laboratory Improvement Amendments CLIA as qualified to perform high complexity clinical laboratory testing.    * 2,000 COLONIES/mL ACINETOBACTER CALCOACETICUS/BAUMANNII COMPLEX  Acid Fast Smear (AFB)     Status: None   Collection Time: 04/19/24 12:38 PM   Specimen: Bronchial Alveolar Lavage; Respiratory  Result Value Ref Range Status   AFB Specimen Processing Concentration  Final   Acid Fast Smear Negative  Final    Comment: (NOTE) Performed At: [facility] [address] [place], [place] [id number] Jennette Shorter MD Ey:[phone]    Source (AFB) BRONCHIAL ALVEOLAR LAVAGE  Final    Comment: Performed at [facility], [address] [place], [place] [address]  Fungus Culture Result     Status: None   Collection Time: 04/19/24 12:38 PM  Result Value Ref Range Status   Result 1 Comment  Final    Comment: (NOTE) KOH/Calcofluor preparation:  no fungus observed. Performed At: [facility] [address] [place], [place] [id number] Jennette Shorter MD Ey:[phone]     Radiology Report VAS US  LOWER EXTREMITY VENOUS (DVT) Result  Date: 04/28/2024  Lower Venous DVT Study Patient Name:  WIL SLAPE  Date of Exam:   04/28/2024 Medical Rec #: [id number]          Accession #:    [id number] Date of Birth: 08-Sep-1955           Patient Gender: M Patient Age:   57 years Exam Location:  [facility] Procedure:      VAS US  LOWER EXTREMITY VENOUS (DVT) Referring Phys: DORN CHILL --------------------------------------------------------------------------------  Indications: Edema, and alcohol abuse, history of DVT, smoker.  Comparison Study: Previous study on 7.15.2025. Performing Technologist: Edilia Elden Appl  Examination Guidelines: A complete evaluation includes B-mode imaging, spectral Doppler, color Doppler, and power Doppler as needed of all accessible portions of each vessel.  Bilateral testing is considered an integral part of a complete examination. Limited examinations for reoccurring indications may be performed as noted. The reflux portion of the exam is performed with the patient in reverse Trendelenburg.  +---------+---------------+---------+-----------+----------+--------------+ RIGHT    CompressibilityPhasicitySpontaneityPropertiesThrombus Aging +---------+---------------+---------+-----------+----------+--------------+ CFV      Partial        Yes      Yes                                 +---------+---------------+---------+-----------+----------+--------------+ SFJ      Full           Yes      Yes                                 +---------+---------------+---------+-----------+----------+--------------+ FV Prox  None           No       No                                  +---------+---------------+---------+-----------+----------+--------------+ FV Mid   None           No       No                                  +---------+---------------+---------+-----------+----------+--------------+ FV DistalNone           No       No                                  +---------+---------------+---------+-----------+----------+--------------+ PFV      Full                                                        +---------+---------------+---------+-----------+----------+--------------+ POP      Partial        Yes      Yes                                 +---------+---------------+---------+-----------+----------+--------------+ PTV      Partial        Yes      Yes                                 +---------+---------------+---------+-----------+----------+--------------+  PERO     Partial        Yes      Yes                                 +---------+---------------+---------+-----------+----------+--------------+   +---------+---------------+---------+-----------+----------+--------------+ LEFT      CompressibilityPhasicitySpontaneityPropertiesThrombus Aging +---------+---------------+---------+-----------+----------+--------------+ CFV      Partial        Yes      Yes                                 +---------+---------------+---------+-----------+----------+--------------+ SFJ      Partial        Yes      Yes                                 +---------+---------------+---------+-----------+----------+--------------+ FV Prox  None           No       No                                  +---------+---------------+---------+-----------+----------+--------------+ FV Mid   None           No       No                                  +---------+---------------+---------+-----------+----------+--------------+ FV DistalPartial        Yes      Yes                                 +---------+---------------+---------+-----------+----------+--------------+ PFV      None           No       No                                  +---------+---------------+---------+-----------+----------+--------------+ POP      Partial        Yes      Yes                                 +---------+---------------+---------+-----------+----------+--------------+ PTV      Partial        Yes      Yes                                 +---------+---------------+---------+-----------+----------+--------------+ PERO     Partial        Yes      Yes                                 +---------+---------------+---------+-----------+----------+--------------+     Summary: RIGHT: - Findings consistent with acute deep vein thrombosis involving the right femoral vein, right posterior tibial veins, and right peroneal veins.  - Findings consistent with chronic deep vein thrombosis involving the right common femoral vein, SF junction, and  right popliteal vein.  - No cystic structure found in the popliteal fossa.  LEFT: - Findings consistent with age indeterminate deep vein thrombosis involving  the left femoral vein, left proximal profunda vein, left posterior tibial veins, and left peroneal veins.  - Findings consistent with chronic deep vein thrombosis involving the left common femoral vein, SF junction, and left popliteal vein.  - No cystic structure found in the popliteal fossa.  *See table(s) above for measurements and observations. Electronically signed by Penne Colorado MD on 04/28/2024 at 7:42:52 PM.    Final    CT Angio Chest Pulmonary Embolism (PE) W or WO Contrast Result Date: 04/28/2024 CLINICAL DATA:  Hypoglycemia. Clinical concern for pulmonary embolism. EXAM: CT ANGIOGRAPHY CHEST WITH CONTRAST TECHNIQUE: Multidetector CT imaging of the chest was performed using the standard protocol during bolus administration of intravenous contrast. Multiplanar CT image reconstructions and MIPs were obtained to evaluate the vascular anatomy. RADIATION DOSE REDUCTION: This exam was performed according to the departmental dose-optimization program which includes automated exposure control, adjustment of the mA and/or kV according to patient size and/or use of iterative reconstruction technique. CONTRAST:  75mL OMNIPAQUE  IOHEXOL  350 MG/ML SOLN COMPARISON:  Chest CT with contrast 04/17/2024 FINDINGS: Cardiovascular: The heart is enlarged. No substantial pericardial effusion. Coronary artery calcification is evident. Mild atherosclerotic calcification is noted in the wall of the thoracic aorta. Ascending thoracic aorta measures up to 4.5 cm diameter. Enlargement of the pulmonary outflow tract/main pulmonary arteries suggests pulmonary arterial hypertension. There is no filling defect within the opacified pulmonary arteries to suggest the presence of an acute pulmonary embolus. Mediastinum/Nodes: Similar appearance of mild mediastinal lymphadenopathy comparing to the CT scan from just over 1 week ago. Soft tissue attenuation in the hilar regions bilaterally compatible with central lung collapse/consolidation.  The esophagus has normal imaging features. There is no axillary lymphadenopathy. Lungs/Pleura: Centrilobular and paraseptal emphysema evident. Increased consolidative lung disease in the parahilar and suprahilar left lung, now incorporating the cavitary lesion seen previously. The cavitary component towards the left lung apex now measures 5.8 x 3.9 cm compared to 6.6 x 4.9 cm previously. There is a new air-fluid level within this collection on today's study. The more caudal thick walled component of this cystic lesion seen previously has become obscured inferiorly by adjacent lung collapse/consolidation. There is worsening dependent collapse/consolidation of both lower lobes. Moderate bilateral pleural effusions evident. Upper Abdomen: Nodular liver contour suggests cirrhosis. The visualized portion of the upper abdomen is otherwise unremarkable. Musculoskeletal: No worrisome lytic or sclerotic osseous abnormality. Review of the MIP images confirms the above findings. IMPRESSION: 1. No CT evidence for acute pulmonary embolus. 2. Increased consolidative lung disease in the parahilar and suprahilar left lung, now incorporating the cavitary lesion seen previously. The cavitary component towards the left lung apex now measures 5.8 x 3.9 cm compared to 6.6 x 4.9 cm previously. There is a new air-fluid level within this collection on today's study. The more caudal thick walled component of this cystic lesion seen previously has become obscured inferiorly by adjacent lung collapse/consolidation. 3. Worsening dependent collapse/consolidation of both lower lobes with worsening of the now moderate bilateral pleural effusions. 4. Similar appearance of mild mediastinal lymphadenopathy comparing to the CT scan from just over 1 week ago. 5. Nodular liver contour suggests cirrhosis. 6. Aortic Atherosclerosis (ICD10-I70.0) and Emphysema (ICD10-J43.9). Electronically Signed   By: Camellia Candle M.D.   On: 04/28/2024 08:44        Signature  -   Brayton Johnn Krasowski  M.D on 04/29/2024 at 10:11 AM   -  To page go to [email or web address]

## 2024-04-29 NOTE — Procedures (Signed)
 Thoracentesis  Procedure Note  JOHNTHAN AXTMAN  [id number]  May 22, 1956  Date:04/29/24  Time:3:38 PM   Provider Performing:Hamlin Devine Slater Staff, MD. Assisted by Claudetta Silence, MD  Procedure: Thoracentesis with imaging guidance ([id number])  Indication(s) Pleural Effusion  Consent Risks of the procedure as well as the alternatives and risks of each were explained to the patient and/or caregiver.  Consent for the procedure was obtained and is signed in the bedside chart  Anesthesia Topical only with 1% lidocaine     Time Out Verified patient identification, verified procedure, site/side was marked, verified correct patient position, special equipment/implants available, medications/allergies/relevant history reviewed, required imaging and test results available.   Sterile Technique Maximal sterile technique including full sterile barrier drape, hand hygiene, sterile gown, sterile gloves, mask, hair covering, sterile ultrasound probe cover (if used).  Procedure Description Ultrasound was used to identify appropriate pleural anatomy for placement and overlying skin marked.  Area of drainage cleaned and draped in sterile fashion. Lidocaine  was used to anesthetize the skin and subcutaneous tissue.  1200 cc's of straw colored appearing fluid was drained from the right pleural space. Catheter then removed and bandaid applied to site.   Complications/Tolerance None; patient tolerated the procedure well. Chest X-ray is ordered to confirm no post-procedural complication.   EBL Minimal   Specimen(s) Pleural fluid

## 2024-04-29 NOTE — Plan of Care (Signed)
  Problem: Education: Goal: Knowledge of General Education information will improve Description: Including pain rating scale, medication(s)/side effects and non-pharmacologic comfort measures Outcome: Progressing   Problem: Clinical Measurements: Goal: Will remain free from infection Outcome: Progressing Goal: Diagnostic test results will improve Outcome: Progressing Goal: Respiratory complications will improve Outcome: Progressing Goal: Cardiovascular complication will be avoided Outcome: Progressing   Problem: Activity: Goal: Risk for activity intolerance will decrease Outcome: Progressing   Problem: Nutrition: Goal: Adequate nutrition will be maintained Outcome: Progressing   Problem: Elimination: Goal: Will not experience complications related to bowel motility Outcome: Progressing

## 2024-04-29 NOTE — TOC Progression Note (Addendum)
 Transition of Care [facility]) - Progression Note    Patient Details  Name: Cody Glenn MRN: [id number] Date of Birth: [birth year]  [facility] CM/SW Contact  Inocente GORMAN Kindle, LCSW Phone Number: 04/29/2024, 9:04 AM  Clinical Narrative:    CSW continuing to follow for medical readiness.    Expected Discharge Plan: Skilled Nursing Facility Barriers to Discharge: Continued Medical Work up              Expected Discharge Plan and Services In-house Referral: Clinical Social Work     Living arrangements for the past 2 months: Homeless                                       Social Drivers of Health (SDOH) Interventions SDOH Screenings   Food Insecurity: Food Insecurity Present (04/17/2024)  Housing: High Risk (04/17/2024)  Transportation Needs: Unmet Transportation Needs (04/17/2024)  Utilities: Not At Risk (04/17/2024)  Depression (PHQ2-9): Low Risk  (10/13/2020)  Social Connections: Moderately Isolated (04/17/2024)  Tobacco Use: High Risk (04/15/2024)    Readmission Risk Interventions    02/22/2024    2:07 PM  Readmission Risk Prevention Plan  Post Dischage Appt Complete  Medication Screening Complete  Transportation Screening Complete

## 2024-04-29 NOTE — Progress Notes (Signed)
 NAME:  Cody Glenn, MRN:  [id number], DOB:  30-Jun-1956, LOS: 13 ADMISSION DATE:  04/15/2024, CONSULTATION DATE:  04/17/2024 REFERRING MD:  Dr. Dennise, CHIEF COMPLAINT:  Abnormal CXR   History of Present Illness:  68 yo M PMH etoh abuse, dvt, DM, HTN, cavitary lung lesion who was admitted to [facility] 04/16/24 after presenting to ED for AMS. He was found to be hypoglycemic which was addressed, and have AoC hyponatremia for which nephrology was consulted. A CXR was obtained 9/10 which reveals LUL cavitary lesion, and air concerning for possible BPF.    PCCM consulted in this setting.   He has a known LUL cavitary lesion that was worked up on prior admission, and was d/c in July on 3 wk course of Augmentin . He reports to me he completed this. No resp complaints. Does endorse frequent coughing when he swallows thin liquids, specifically noting that he coughs a lot when he drinks alcohol.   Pertinent  Medical History  EtOH use disorder Cavitary lung lesion Malnutrition DVT HTN   Significant Hospital Events: Including procedures, antibiotic start and stop dates in addition to other pertinent events   9/9 - Admitted 9/10 - Pulm consult, GI panel positive for EPEC and Shigella 9/11 - CT Surgery consult, no need for surgery at this time 9/12 - Bronchoscopy with BAL performed 9/13 - Thoracentesis performed   Interim History / Subjective:  Today, patient continues to have dyspnea and some pleuritic chest pain, overall discomfort.   Objective    Blood pressure 131/88, pulse (!) 122, temperature 97.8 F (36.6 C), temperature source Oral, resp. rate (!) 25, height 5' 5 (1.651 m), weight 68 kg, SpO2 96%.        Intake/Output Summary (Last 24 hours) at 04/29/2024 1555 Last data filed at 04/29/2024 1204 Gross per 24 hour  Intake 350 ml  Output 2200 ml  Net -1850 ml   Filed Weights   04/15/24 2033 04/19/24 1154  Weight: 68 kg 68 kg    Examination: General: Chronically ill appearing in  mild distress, resting in bed, alert. HEENT: Normocephalic and atraumatic.  No scleral icterus. Cardiovascular: Regular rate and rhythm. No m/r/g. Pulmonary: Dyspneic and tachypneic with exertion but normal WOB on 4 L [place].  Diminished breath sounds at bilateral lung bases with coarse noises globally. Abdominal: Nondistended.  No Skin: Warm and dry. Extremities: No peripheral edema bilaterally.  Capillary refill <2 seconds.   Assessment and Plan   Persistent LUL cavitary lung lesion Acinetobacter pneumonia Aspiration Risk, High Alcohol Use Disorder Multifocal ASD Suspected dysphagia EPEC/Shigella gastroenteritis Severe HFrEF, cardiomyopathy  Echo showing LVEF 10% with severely decreased function and global hypokinesis, consistent with severe HFrEF and new cardiomyopathy. CTA negative for PE but with new air-fluid level within fluid collection now involving cavitary lesion at LL apex. Given persistent dyspnea and chest discomfort, thoracentesis is indicated today.  Plan - Acinetobacter growth from BAL culture sensitive to meropenem  - Continue meropenem  (9/21>), avoid Unasyn  given high salt load from antibiotic given current concern for heart failure - Diurese as able - Performed thoracentesis with 1200 CCs fluid withdrawn, patient tolerated procedure well, see separate procedure note 9/22 - Follow up CXR with stable pleural effusions, no pneumo - Follow thoracentesis labs (LDH, culture w/ stain, cytology, protein, glucose, cell count, cholesterol)   Labs   CBC: Recent Labs  Lab 04/23/24 2358 04/26/24 0318 04/27/24 1039 04/28/24 0240 04/29/24 0311  WBC 10.4 5.1 5.8 3.3* 6.0  HGB 9.5* 8.5* 9.4* 8.9* 8.8*  HCT 29.8* 25.1* 29.9* 27.2* 27.3*  MCV 100.7* 99.6 104.9* 101.1* 103.4*  PLT 339 343 367 304 327    Basic Metabolic Panel: Recent Labs  Lab 04/23/24 2358 04/26/24 0318 04/27/24 1039 04/28/24 0240 04/29/24 0311  NA 133* 142 142 143 144  K 4.2 3.4* 4.2 4.2 3.7  CL  103 108 110 112* 108  CO2 21* 20* 18* 20* 22  GLUCOSE 125* 135* 115* 145* 117*  BUN 8 8 11 13 17   CREATININE 0.71 0.66 0.74 0.82 0.78  CALCIUM  7.9* 8.2* 8.6* 8.7* 8.6*  MG 1.8 1.7 2.0 2.0 2.0  PHOS 4.0 3.1 4.4 4.5 5.2*   GFR: Estimated Creatinine Clearance: 77.9 mL/min (by C-G formula based on SCr of 0.78 mg/dL). Recent Labs  Lab 04/23/24 2358 04/26/24 0318 04/27/24 1039 04/27/24 1729 04/27/24 1933 04/27/24 2233 04/28/24 0240 04/29/24 0311  PROCALCITON  --   --  <0.10  --   --   --   --   --   WBC 10.4 5.1 5.8  --   --   --  3.3* 6.0  LATICACIDVEN 1.8  --   --  3.6* 3.4* 2.7*  --   --     Liver Function Tests: No results for input(s): AST, ALT, ALKPHOS, BILITOT, PROT, ALBUMIN in the last 168 hours. No results for input(s): LIPASE, AMYLASE in the last 168 hours. No results for input(s): AMMONIA in the last 168 hours.  ABG    Component Value Date/Time   PHART 7.41 04/27/2024 1353   PCO2ART 29 (L) 04/27/2024 1353   PO2ART 68 (L) 04/27/2024 1353   HCO3 18.4 (L) 04/27/2024 1353   TCO2 20 (L) 04/15/2024 2043   ACIDBASEDEF 5.0 (H) 04/27/2024 1353   O2SAT 94.2 04/27/2024 1353     Coagulation Profile: No results for input(s): INR, PROTIME in the last 168 hours.  Cardiac Enzymes: No results for input(s): CKTOTAL, CKMB, CKMBINDEX, TROPONINI in the last 168 hours.  HbA1C: Hgb A1c MFr Bld  Date/Time Value Ref Range Status  04/16/2024 11:01 AM 4.1 (L) 4.8 - 5.6 % Final    Comment:    (NOTE) Diagnosis of Diabetes The following HbA1c ranges recommended by the American Diabetes Association (ADA) may be used as an aid in the diagnosis of diabetes mellitus.  Hemoglobin             Suggested A1C NGSP%              Diagnosis  <5.7                   Non Diabetic  5.7-6.4                Pre-Diabetic  >6.4                   Diabetic  <7.0                   Glycemic control for                       adults with diabetes.    10/13/2020  02:08 PM 5.5 4.8 - 5.6 % Final    Comment:             Prediabetes: 5.7 - 6.4          Diabetes: >6.4          Glycemic control for adults with diabetes: <7.0     CBG: Recent Labs  Lab 04/28/24 1305 04/28/24 1615 04/28/24 2208 04/29/24 0741 04/29/24 1159  GLUCAP 122* 165* 145* 86 77    Past Medical History:  He,  has a past medical history of Alcohol use, Anemia, Cavitary lung disease, Cirrhosis (HCC), Homelessness, Hyperbilirubinemia (07/01/2020), Peptic ulcer (08/09/1987), Thoracic ascending aortic aneurysm (HCC), Thrombocytopenia, and Tobacco abuse.   Surgical History:   Past Surgical History:  Procedure Laterality Date   FLEXIBLE BRONCHOSCOPY Left 02/21/2024   Procedure: BRONCHOSCOPY, FLEXIBLE;  Surgeon: Shelah Lamar RAMAN, MD;  Location: [facility] ENDOSCOPY;  Service: Cardiopulmonary;  Laterality: Left;   FLEXIBLE BRONCHOSCOPY Bilateral 04/19/2024   Procedure: BRONCHOSCOPY, FLEXIBLE;  Surgeon: Kara Dorn NOVAK, MD;  Location: [facility] ENDOSCOPY;  Service: Pulmonary;  Laterality: Bilateral;   ulcer surgery in stomach     pt states he had this approx 33 years ago     Social History:   reports that he has been smoking cigarettes. He has a 40 pack-year smoking history. He has never used smokeless tobacco. He reports current alcohol use. He reports that he does not currently use drugs.   Family History:  His family history is not on file.   Allergies Allergies  Allergen Reactions   Lactose Intolerance (Gi)     Per pt report     Home Medications  Prior to Admission medications   Medication Sig Start Date End Date Taking? Authorizing Provider  folic acid  (FOLVITE ) 1 MG tablet Take 1 tablet (1 mg total) by mouth daily. 02/28/24  Yes Lue Elsie BROCKS, MD  thiamine  (VITAMIN B1) 100 MG tablet Take 1 tablet (100 mg total) by mouth daily. 02/28/24  Yes Lue Elsie BROCKS, MD     Critical care time: 30 minutes     Vonda Harth Toma, MD PGY-2, ICU resident 04/29/24, 3:55 PM

## 2024-04-29 NOTE — Consult Note (Addendum)
 Cardiology Consultation   Patient ID: Cody Glenn MRN: [id number]; DOB: [birth year]  Admit date: 04/15/2024 Date of Consult: 04/29/2024  PCP:  Cody Belvie BRAVO, MD   [facility] Providers Cardiologist: new  Click here to update MD or APP on Care Team, Refresh:1}     Patient Profile: Cody Glenn is a 68 y.o. male with a hx of alcoholism, tobacco abuse, cavitary lung lesion, DVT, PUD, pre-DM, HTN, spinal stenosis, cirrhosis, ascending aortic aneurysm (4.5cm by CT this admission), thrombocytopenia, chronic appearing anemia, homelessness who is being seen 04/29/2024 for the evaluation of CHF at the request of Dr. Sherlon.  History of Present Illness:  Cody Glenn has no formal cardiac history. He was last admitted 02/2024 with cough, hemoptysis, and hypoxia. 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 He was felt to be a poor candidate for resection per pulm team. LE venous duplex 02/2024 showed chronic LLE DVT. He was not anticoagulated. 2D echo 02/21/24 EF 55-60%, mild LVH, mild-moderate TR.  He returned to [facility] on 04/15/24 after found to be intoxicated wandering in a parking lot. Initial workup showed dehydration with hypoglycemia, severe hyponatremia (112), hypomagnesemia, hypophosphatemia. He also had sepsis and multifocal PNA with persistent LUL cavitary lesions. BAL grew acinetobacter. He has also had diarrhea with EPEC/Shigella and hospital delirium. Over the last few days he has developed increased WOB. CXR 9/19 showed worsening bilateral airspace opacities and small pleural effusions. CTA yesterday showed increased consolidative lung disease in the parahilar/suprahilar left lung incorporating the cavitary lesion and moderate pleural effusions. F/u echo was obtained today showing EF 10%, VTI 10cm concerning for low output HF, hypokinesis of the mid to distal LV / LV apex in an LAD territory, mildly reduced RV function,  moderate LAE. Last BNP 4294. HsTroponins have been low/flat in the 30s since 9/20. He has received sporadic Lasix  dosing - 40mg  on 9/20, 40mg  -> 60mg  on 9/21, transitioned to 80mg  IV BID today - with good UOP per nurse, -900 thus far. He also just completed a thoracentesis with -1.2L from R pleural space. His breathing feels improved with this measure. He denies any recent anginal chest pain but had noticed occasional fleeting chest pains the last few days, none currently. He reports that he previously had much more significant edema that has also begun to improve.  Past Medical History:  Diagnosis Date   Alcohol use    Anemia    Cavitary lung disease    Cirrhosis (HCC)    Homelessness    Hyperbilirubinemia 07/01/2020   Peptic ulcer 08/09/1987   Jul 28, 2008 Entered By: KATHERINE ETHA HERO Comment: s/p partial gastrectomy   Thoracic ascending aortic aneurysm    Thrombocytopenia    Tobacco abuse     Past Surgical History:  Procedure Laterality Date   FLEXIBLE BRONCHOSCOPY Left 02/21/2024   Procedure: BRONCHOSCOPY, FLEXIBLE;  Surgeon: Shelah Lamar RAMAN, MD;  Location: [facility] ENDOSCOPY;  Service: Cardiopulmonary;  Laterality: Left;   FLEXIBLE BRONCHOSCOPY Bilateral 04/19/2024   Procedure: BRONCHOSCOPY, FLEXIBLE;  Surgeon: Kara Dorn NOVAK, MD;  Location: [facility] ENDOSCOPY;  Service: Pulmonary;  Laterality: Bilateral;   ulcer surgery in stomach     pt states he had this approx 33 years ago     Home Medications:  Prior to Admission medications   Medication Sig Start Date End Date Taking? Authorizing Provider  folic acid  (FOLVITE ) 1 MG tablet Take 1 tablet (1 mg total) by mouth daily. 02/28/24  Yes Lue Elsie BROCKS, MD  thiamine  (VITAMIN B1) 100 MG tablet Take 1 tablet (100 mg total) by mouth daily. 02/28/24  Yes Lue Elsie BROCKS, MD    Scheduled Meds:  vitamin B-12  1,000 mcg Oral Daily   feeding supplement  1 Container Oral TID BM   folic acid   1 mg Oral Daily   furosemide   80 mg  Intravenous BID   [START ON 04/30/2024] heparin  injection (subcutaneous)  5,000 Units Subcutaneous Q8H   insulin  aspart  0-9 Units Subcutaneous TID WC   lidocaine   1 patch Transdermal Q24H   multivitamin with minerals  1 tablet Oral Daily   nicotine   14 mg Transdermal Daily   pantoprazole   40 mg Oral Daily   potassium chloride   40 mEq Oral Once   Followed by   potassium chloride   20 mEq Oral BID   thiamine   100 mg Oral Daily   Or   thiamine   100 mg Intravenous Daily   Continuous Infusions:  meropenem  (MERREM ) IV 1 g (04/29/24 1432)   PRN Meds: acetaminophen  **OR** acetaminophen , dextrose , levalbuterol , melatonin, nicotine  polacrilex, polyethylene glycol  Allergies:    Allergies  Allergen Reactions   Lactose Intolerance (Gi)     Per pt report    Social History:   Social History   Socioeconomic History   Marital status: Single    Spouse name: Not on file   Number of children: Not on file   Years of education: Not on file   Highest education level: Not on file  Occupational History   Not on file  Tobacco Use   Smoking status: Every Day    Current packs/day: 1.00    Average packs/day: 1 pack/day for 40.0 years (40.0 ttl pk-yrs)    Types: Cigarettes   Smokeless tobacco: Never  Vaping Use   Vaping status: Never Used  Substance and Sexual Activity   Alcohol use: Yes    Comment: lots on 06/29/20   Drug use: Not Currently   Sexual activity: Not on file  Other Topics Concern   Not on file  Social History Narrative   Not on file   Social Drivers of Health   Financial Resource Strain: Not on file  Food Insecurity: Food Insecurity Present (04/17/2024)   Hunger Vital Sign    Worried About Running Out of Food in the Last Year: Often true    Ran Out of Food in the Last Year: Often true  Transportation Needs: Unmet Transportation Needs (04/17/2024)   PRAPARE - Administrator, Civil Service (Medical): Yes    Lack of Transportation (Non-Medical): Yes   Physical Activity: Not on file  Stress: Not on file  Social Connections: Moderately Isolated (04/17/2024)   Social Connection and Isolation Panel    Frequency of Communication with Friends and Family: Once a week    Frequency of Social Gatherings with Friends and Family: Three times a week    Attends Religious Services: 1 to 4 times per year    Active Member of Clubs or Organizations: No    Attends Banker Meetings: Never    Marital Status: Never married  Intimate Partner Violence: Not At Risk (04/17/2024)   Humiliation, Afraid, Rape, and Kick questionnaire    Fear of Current or Ex-Partner: No    Emotionally Abused: No    Physically Abused: No    Sexually Abused: No    Family History:   Family History  Family history unknown: Yes  ROS:  Please see the history of present illness.  All other ROS reviewed and negative.     Physical Exam/Data: Vitals:   04/29/24 0800 04/29/24 0904 04/29/24 1200 04/29/24 1509  BP: (!) 130/104  131/88   Pulse:   98 (!) 122  Resp: (!) 21  (!) 23 (!) 25  Temp:   97.8 F (36.6 C)   TempSrc:   Oral   SpO2:  100%  96%  Weight:      Height:        Intake/Output Summary (Last 24 hours) at 04/29/2024 1609 Last data filed at 04/29/2024 1204 Gross per 24 hour  Intake 350 ml  Output 1700 ml  Net -1350 ml      04/19/2024   11:54 AM 04/15/2024    8:33 PM 03/18/2024    1:20 PM  Last 3 Weights  Weight (lbs) 149 lb 14.6 oz 150 lb 152 lb 1.9 oz  Weight (kg) 68 kg 68.04 kg 69 kg     Body mass index is 24.95 kg/m.  General: Chronically ill appearing, in no acute distress.  Head: Normocephalic, atraumatic, sclera icteric with arcus senilis, no xanthomas, nares are without discharge. Neck: Negative for carotid bruits. JVP not elevated. Lungs: Diminished BS at bases with poor inspiratory effort without wheezes, rales, or rhonchi. Breathing is unlabored. Heart: Tachycardic, regular S1 S2 without murmurs, rubs, or gallops.  Abdomen: Soft,  non-tender, non-distended with normoactive bowel sounds. No rebound/guarding. Extremities: No clubbing or cyanosis. Mild BLE sockline edema. Distal pedal pulses are 2+ and equal bilaterally. Neuro: Alert and oriented X 3. Moves all extremities spontaneously. Psych:  Responds to questions appropriately with a normal affect.   EKG:  The EKG was personally reviewed and demonstrates:  NSR 99bpm, ST upsloping V1-V3 with diffuse TWI otherwise, QT prolongation (changes new from 04/16/24) Telemetry:  Telemetry was personally reviewed and demonstrates:  NSR/sinus tach  Relevant CV Studies: 2d echo 04/29/24  1. Left ventricular ejection fraction, by estimation, is 10%. The left  ventricle has severely decreased function. The left ventricle demonstrates  global hypokinesis. The left ventricular internal cavity size was mildly  to moderately dilated. VTI is 10cm   concerning for low output heart failure. There appears to be exagerrated  hypokinesis of the mid to distal LV / LV apex in an LAD territory. Cannot  rule out underlying CAD.   2. Right ventricular systolic function is mildly reduced. The right  ventricular size is normal.   3. The mitral valve is normal in structure. No evidence of mitral valve  regurgitation.   4. The aortic valve is normal in structure. Aortic valve regurgitation is  not visualized.   5. Left atrial size was moderately dilated.   Laboratory Data: High Sensitivity Troponin:   Recent Labs  Lab 04/27/24 1729 04/27/24 1933 04/27/24 2233 04/29/24 0942  TROPONINIHS 35* 36* 35* 36*     Chemistry Recent Labs  Lab 04/27/24 1039 04/28/24 0240 04/29/24 0311  NA 142 143 144  K 4.2 4.2 3.7  CL 110 112* 108  CO2 18* 20* 22  GLUCOSE 115* 145* 117*  BUN 11 13 17   CREATININE 0.74 0.82 0.78  CALCIUM  8.6* 8.7* 8.6*  MG 2.0 2.0 2.0  GFRNONAA >60 >60 >60  ANIONGAP 14 11 14     No results for input(s): PROT, ALBUMIN, AST, ALT, ALKPHOS, BILITOT in the  last 168 hours. Lipids No results for input(s): CHOL, TRIG, HDL, LABVLDL, LDLCALC, CHOLHDL in the last 168 hours.  Hematology Recent Labs  Lab 04/27/24 1039 04/28/24 0240 04/29/24 0311  WBC 5.8 3.3* 6.0  RBC 2.85* 2.69* 2.64*  HGB 9.4* 8.9* 8.8*  HCT 29.9* 27.2* 27.3*  MCV 104.9* 101.1* 103.4*  MCH 33.0 33.1 33.3  MCHC 31.4 32.7 32.2  RDW 14.6 14.7 15.3  PLT 367 304 327   Thyroid  No results for input(s): TSH, FREET4 in the last 168 hours.  BNP Recent Labs  Lab 04/27/24 1039 04/28/24 0240 04/29/24 0311  BNP 3,496.3* 3,842.6* 4,294.9*    DDimer  Recent Labs  Lab 04/27/24 1933  DDIMER 1.09*    Radiology/Studies:  ECHOCARDIOGRAM COMPLETE Result Date: 04/29/2024    ECHOCARDIOGRAM REPORT   Patient Name:   Cody Glenn Date of Exam: 04/29/2024 Medical Rec #:  [id number]         Height:       65.0 in Accession #:    [id number]        Weight:       149.9 lb Date of Birth:  03/10/1956          BSA:          1.750 m Patient Age:    68 years          BP:           123/98 mmHg Patient Gender: M                 HR:           103 bpm. Exam Location:  Inpatient Procedure: 2D Echo and Intracardiac Opacification Agent (Both Spectral and Color            Flow Doppler were utilized during procedure). Indications:    Dyspnea, CHF  History:        Patient has prior history of Echocardiogram examinations. Risk                 Factors:Hypertension.  Sonographer:    Charmaine Gaskins Referring Phys: [id number] DAWOOD S ELGERGAWY IMPRESSIONS  1. Left ventricular ejection fraction, by estimation, is 10%. The left ventricle has severely decreased function. The left ventricle demonstrates global hypokinesis. The left ventricular internal cavity size was mildly to moderately dilated. VTI is 10cm  concerning for low output heart failure. There appears to be exagerrated hypokinesis of the mid to distal LV / LV apex in an LAD territory. Cannot rule out underlying CAD.  2. Right ventricular systolic  function is mildly reduced. The right ventricular size is normal.  3. The mitral valve is normal in structure. No evidence of mitral valve regurgitation.  4. The aortic valve is normal in structure. Aortic valve regurgitation is not visualized.  5. Left atrial size was moderately dilated. FINDINGS  Left Ventricle: Left ventricular ejection fraction, by estimation, is <20%. The left ventricle has severely decreased function. The left ventricle demonstrates regional wall motion abnormalities. Definity  contrast agent was given IV to delineate the left ventricular endocardial borders. The left ventricular internal cavity size was mildly to moderately dilated. There is no left ventricular hypertrophy. Indeterminate diastolic filling due to E-A fusion. Right Ventricle: The right ventricular size is normal. No increase in right ventricular wall thickness. Right ventricular systolic function is mildly reduced. Left Atrium: Left atrial size was moderately dilated. Right Atrium: Right atrial size was normal in size. Pericardium: There is no evidence of pericardial effusion. Mitral Valve: The mitral valve is normal in structure. No evidence of mitral valve regurgitation. Tricuspid Valve: The tricuspid valve is  normal in structure. Tricuspid valve regurgitation is trivial. Aortic Valve: The aortic valve is normal in structure. Aortic valve regurgitation is not visualized. Pulmonic Valve: The pulmonic valve was normal in structure. Pulmonic valve regurgitation is trivial. Aorta: The aortic root is normal in size and structure. IAS/Shunts: No atrial level shunt detected by color flow Doppler.  LEFT VENTRICLE PLAX 2D LVIDd:         5.12 cm LVIDs:         4.46 cm LV PW:         0.95 cm LV IVS:        0.96 cm LVOT diam:     2.13 cm LV SV:         29 LV SV Index:   17 LVOT Area:     3.56 cm  LV Volumes (MOD) LV vol d, MOD A2C: 207.0 ml LV vol d, MOD A4C: 193.0 ml LV vol s, MOD A2C: 170.0 ml LV vol s, MOD A4C: 156.0 ml LV SV MOD A2C:      37.0 ml LV SV MOD A4C:     193.0 ml LV SV MOD BP:      35.0 ml RIGHT VENTRICLE RV Basal diam:  3.30 cm RV Mid diam:    2.72 cm RV S prime:     12.00 cm/s TAPSE (M-mode): 1.3 cm LEFT ATRIUM             Index        RIGHT ATRIUM           Index LA diam:        3.84 cm 2.19 cm/m   RA Area:     15.10 cm LA Vol (A2C):   77.3 ml 44.17 ml/m  RA Volume:   39.40 ml  22.51 ml/m LA Vol (A4C):   75.5 ml 43.14 ml/m LA Biplane Vol: 76.0 ml 43.43 ml/m  AORTIC VALVE LVOT Vmax:   61.60 cm/s LVOT Vmean:  40.300 cm/s LVOT VTI:    0.082 m  SHUNTS Systemic VTI:  0.08 m Systemic Diam: 2.13 cm Aditya Sabharwal Electronically signed by Ria Commander Signature Date/Time: 04/29/2024/2:33:25 PM    Final    VAS US  LOWER EXTREMITY VENOUS (DVT) Result Date: 04/28/2024  Lower Venous DVT Study Patient Name:  Cody Glenn  Date of Exam:   04/28/2024 Medical Rec #: [id number]          Accession #:    [id number] Date of Birth: 11-18-1955           Patient Gender: M Patient Age:   36 years Exam Location:  [facility] Procedure:      VAS US  LOWER EXTREMITY VENOUS (DVT) Referring Phys: DORN CHILL --------------------------------------------------------------------------------  Indications: Edema, and alcohol abuse, history of DVT, smoker.  Comparison Study: Previous study on 7.15.2025. Performing Technologist: Edilia Elden Appl  Examination Guidelines: A complete evaluation includes B-mode imaging, spectral Doppler, color Doppler, and power Doppler as needed of all accessible portions of each vessel. Bilateral testing is considered an integral part of a complete examination. Limited examinations for reoccurring indications may be performed as noted. The reflux portion of the exam is performed with the patient in reverse Trendelenburg.  +---------+---------------+---------+-----------+----------+--------------+ RIGHT    CompressibilityPhasicitySpontaneityPropertiesThrombus Aging  +---------+---------------+---------+-----------+----------+--------------+ CFV      Partial        Yes      Yes                                 +---------+---------------+---------+-----------+----------+--------------+  SFJ      Full           Yes      Yes                                 +---------+---------------+---------+-----------+----------+--------------+ FV Prox  None           No       No                                  +---------+---------------+---------+-----------+----------+--------------+ FV Mid   None           No       No                                  +---------+---------------+---------+-----------+----------+--------------+ FV DistalNone           No       No                                  +---------+---------------+---------+-----------+----------+--------------+ PFV      Full                                                        +---------+---------------+---------+-----------+----------+--------------+ POP      Partial        Yes      Yes                                 +---------+---------------+---------+-----------+----------+--------------+ PTV      Partial        Yes      Yes                                 +---------+---------------+---------+-----------+----------+--------------+ PERO     Partial        Yes      Yes                                 +---------+---------------+---------+-----------+----------+--------------+   +---------+---------------+---------+-----------+----------+--------------+ LEFT     CompressibilityPhasicitySpontaneityPropertiesThrombus Aging +---------+---------------+---------+-----------+----------+--------------+ CFV      Partial        Yes      Yes                                 +---------+---------------+---------+-----------+----------+--------------+ SFJ      Partial        Yes      Yes                                  +---------+---------------+---------+-----------+----------+--------------+ FV Prox  None           No       No                                  +---------+---------------+---------+-----------+----------+--------------+  FV Mid   None           No       No                                  +---------+---------------+---------+-----------+----------+--------------+ FV DistalPartial        Yes      Yes                                 +---------+---------------+---------+-----------+----------+--------------+ PFV      None           No       No                                  +---------+---------------+---------+-----------+----------+--------------+ POP      Partial        Yes      Yes                                 +---------+---------------+---------+-----------+----------+--------------+ PTV      Partial        Yes      Yes                                 +---------+---------------+---------+-----------+----------+--------------+ PERO     Partial        Yes      Yes                                 +---------+---------------+---------+-----------+----------+--------------+     Summary: RIGHT: - Findings consistent with acute deep vein thrombosis involving the right femoral vein, right posterior tibial veins, and right peroneal veins.  - Findings consistent with chronic deep vein thrombosis involving the right common femoral vein, SF junction, and right popliteal vein.  - No cystic structure found in the popliteal fossa.  LEFT: - Findings consistent with age indeterminate deep vein thrombosis involving the left femoral vein, left proximal profunda vein, left posterior tibial veins, and left peroneal veins.  - Findings consistent with chronic deep vein thrombosis involving the left common femoral vein, SF junction, and left popliteal vein.  - No cystic structure found in the popliteal fossa.  *See table(s) above for measurements and observations. Electronically signed  by Penne Colorado MD on 04/28/2024 at 7:42:52 PM.    Final    CT Angio Chest Pulmonary Embolism (PE) W or WO Contrast Result Date: 04/28/2024 CLINICAL DATA:  Hypoglycemia. Clinical concern for pulmonary embolism. EXAM: CT ANGIOGRAPHY CHEST WITH CONTRAST TECHNIQUE: Multidetector CT imaging of the chest was performed using the standard protocol during bolus administration of intravenous contrast. Multiplanar CT image reconstructions and MIPs were obtained to evaluate the vascular anatomy. RADIATION DOSE REDUCTION: This exam was performed according to the departmental dose-optimization program which includes automated exposure control, adjustment of the mA and/or kV according to patient size and/or use of iterative reconstruction technique. CONTRAST:  75mL OMNIPAQUE  IOHEXOL  350 MG/ML SOLN COMPARISON:  Chest CT with contrast 04/17/2024 FINDINGS: Cardiovascular: The heart is enlarged. No substantial pericardial effusion. Coronary artery calcification is evident. Mild atherosclerotic calcification is noted in  the wall of the thoracic aorta. Ascending thoracic aorta measures up to 4.5 cm diameter. Enlargement of the pulmonary outflow tract/main pulmonary arteries suggests pulmonary arterial hypertension. There is no filling defect within the opacified pulmonary arteries to suggest the presence of an acute pulmonary embolus. Mediastinum/Nodes: Similar appearance of mild mediastinal lymphadenopathy comparing to the CT scan from just over 1 week ago. Soft tissue attenuation in the hilar regions bilaterally compatible with central lung collapse/consolidation. The esophagus has normal imaging features. There is no axillary lymphadenopathy. Lungs/Pleura: Centrilobular and paraseptal emphysema evident. Increased consolidative lung disease in the parahilar and suprahilar left lung, now incorporating the cavitary lesion seen previously. The cavitary component towards the left lung apex now measures 5.8 x 3.9 cm compared to 6.6 x  4.9 cm previously. There is a new air-fluid level within this collection on today's study. The more caudal thick walled component of this cystic lesion seen previously has become obscured inferiorly by adjacent lung collapse/consolidation. There is worsening dependent collapse/consolidation of both lower lobes. Moderate bilateral pleural effusions evident. Upper Abdomen: Nodular liver contour suggests cirrhosis. The visualized portion of the upper abdomen is otherwise unremarkable. Musculoskeletal: No worrisome lytic or sclerotic osseous abnormality. Review of the MIP images confirms the above findings. IMPRESSION: 1. No CT evidence for acute pulmonary embolus. 2. Increased consolidative lung disease in the parahilar and suprahilar left lung, now incorporating the cavitary lesion seen previously. The cavitary component towards the left lung apex now measures 5.8 x 3.9 cm compared to 6.6 x 4.9 cm previously. There is a new air-fluid level within this collection on today's study. The more caudal thick walled component of this cystic lesion seen previously has become obscured inferiorly by adjacent lung collapse/consolidation. 3. Worsening dependent collapse/consolidation of both lower lobes with worsening of the now moderate bilateral pleural effusions. 4. Similar appearance of mild mediastinal lymphadenopathy comparing to the CT scan from just over 1 week ago. 5. Nodular liver contour suggests cirrhosis. 6. Aortic Atherosclerosis (ICD10-I70.0) and Emphysema (ICD10-J43.9). Electronically Signed   By: Camellia Candle M.D.   On: 04/28/2024 08:44   DG Chest Port 1 View Result Date: 04/26/2024 CLINICAL DATA:  Hypoxia EXAM: PORTABLE CHEST 1 VIEW COMPARISON:  04/23/2024 FINDINGS: Stable cardiac silhouette. Severe diffuse bilateral airspace disease and interstitial opacities, worsening since prior study. Small bilateral pleural effusions, stable. IMPRESSION: Worsening bilateral interstitial and airspace opacities, likely  worsening edema or infection. Small bilateral effusions, stable. Electronically Signed   By: Franky Crease M.D.   On: 04/26/2024 20:16     Assessment and Plan:  1. Complex medical admission since 04/15/24 - here with initial intoxication, dehydration, severe hyponatremia, hypomagnesemia, hypophosphatemia, sepsis/multifocal PNA with persistent LUL cavitary lesions, BAL + acinetobacter, Shigella gastroenteritis with EPEC, continued anemia  - comanaged by IM, PCCM, ID  2. Acute HFrEF with concern for low output HF, new cardiomyopathy - etiology of CM unclear at this time, multiple physiologic stressors, cannot exclude underlying CAD - given low-level troponin trend, doubt acute ACS this admission - per d/w Advanced HF team, given multiple comorbidities, not a candidate for advanced therapies - would continue diuresis as you are doing - suspect low albumin also contributing to fluid retention (last albumin 1.6) - avoid BB given low output state - s/p 1.2L R pleural fluid out by thoracentesis today - will discuss additional recommendations with MD  3. Elevated troponin - low/flat, not felt to reflect ACS - seems poor candidate for aggressive invasive evaluation with hx ETOH abuse, homelessness, hx PUD,  ongoing cavitary lung lesion concerns, anemia - hold off statin given underlying concerns of liver disease  4. Prolonged QTc - Last K 3.7, Mg 2.0 - d/w IM, they will adjust Remeron  and Haldol  - give KCl 40meq x 1 and start 20meq BID with diuresis  Consider GOC discussion   Risk Assessment/Risk Scores:       [place]  Heart Association (NYHA) Functional Class NYHA Class III-IV   For questions or updates, please contact [facility] Please consult [email or web address] for contact info under      Signed, Vallerie Hentz N Eunie Lawn, PA-C  04/29/2024 4:09 PM

## 2024-04-29 NOTE — Progress Notes (Signed)
 PT Cancellation Note  Patient Details Name: Cody Glenn MRN: [id number] DOB: 29-Aug-1955   Cancelled Treatment:    Reason Eval/Treat Not Completed: Patient at procedure or test/unavailable (Pt undergoing procedure in room. Will follow up later if time allows.)   [name] 04/29/2024, 3:56 PM

## 2024-04-30 ENCOUNTER — Encounter (HOSPITAL_COMMUNITY): Payer: Self-pay | Admitting: Internal Medicine

## 2024-04-30 DIAGNOSIS — I5020 Unspecified systolic (congestive) heart failure: Secondary | ICD-10-CM | POA: Diagnosis not present

## 2024-04-30 DIAGNOSIS — I5021 Acute systolic (congestive) heart failure: Secondary | ICD-10-CM

## 2024-04-30 DIAGNOSIS — J189 Pneumonia, unspecified organism: Secondary | ICD-10-CM | POA: Diagnosis not present

## 2024-04-30 DIAGNOSIS — J90 Pleural effusion, not elsewhere classified: Secondary | ICD-10-CM | POA: Diagnosis not present

## 2024-04-30 LAB — CULTURE, BAL-QUANTITATIVE W GRAM STAIN
Culture: 2000 — AB
Gram Stain: NONE SEEN

## 2024-04-30 LAB — PHOSPHORUS: Phosphorus: 3.7 mg/dL (ref 2.5–4.6)

## 2024-04-30 LAB — BASIC METABOLIC PANEL WITH GFR
Anion gap: 13 (ref 5–15)
BUN: 14 mg/dL (ref 8–23)
CO2: 23 mmol/L (ref 22–32)
Calcium: 8.3 mg/dL — ABNORMAL LOW (ref 8.9–10.3)
Chloride: 106 mmol/L (ref 98–111)
Creatinine, Ser: 0.81 mg/dL (ref 0.61–1.24)
GFR, Estimated: >60 mL/min (ref >60)
Glucose, Bld: 85 mg/dL (ref 70–99)
Potassium: 3.3 mmol/L — ABNORMAL LOW (ref 3.5–5.1)
Sodium: 142 mmol/L (ref 135–145)

## 2024-04-30 LAB — CBC
HCT: 29.1 % — ABNORMAL LOW (ref 39.0–52.0)
Hemoglobin: 9.3 g/dL — ABNORMAL LOW (ref 13.0–17.0)
MCH: 33.1 pg (ref 26.0–34.0)
MCHC: 32 g/dL (ref 30.0–36.0)
MCV: 103.6 fL — ABNORMAL HIGH (ref 80.0–100.0)
Platelets: 286 K/uL (ref 150–400)
RBC: 2.81 MIL/uL — ABNORMAL LOW (ref 4.22–5.81)
RDW: 15.2 % (ref 11.5–15.5)
WBC: 10.2 K/uL (ref 4.0–10.5)
nRBC: 0 % (ref 0.0–0.2)

## 2024-04-30 LAB — GLUCOSE, CAPILLARY
Glucose-Capillary: 37 mg/dL — CL (ref 70–99)
Glucose-Capillary: 93 mg/dL (ref 70–99)
Glucose-Capillary: 68 mg/dL — ABNORMAL LOW (ref 70–99)
Glucose-Capillary: 162 mg/dL — ABNORMAL HIGH (ref 70–99)
Glucose-Capillary: 55 mg/dL — ABNORMAL LOW (ref 70–99)
Glucose-Capillary: 106 mg/dL — ABNORMAL HIGH (ref 70–99)

## 2024-04-30 LAB — MAGNESIUM: Magnesium: 1.8 mg/dL (ref 1.7–2.4)

## 2024-04-30 LAB — BRAIN NATRIURETIC PEPTIDE: B Natriuretic Peptide: 4223.7 pg/mL — ABNORMAL HIGH (ref 0.0–100.0)

## 2024-04-30 MED ORDER — MAGNESIUM SULFATE 2 GM/50ML IV SOLN
2.0000 g | Freq: Once | INTRAVENOUS | Status: AC
  Administered 2024-04-30: 2 g via INTRAVENOUS
  Filled 2024-04-30: qty 50

## 2024-04-30 MED ORDER — POTASSIUM CHLORIDE CRYS ER 20 MEQ PO TBCR
40.0000 meq | EXTENDED_RELEASE_TABLET | Freq: Once | ORAL | Status: AC
  Administered 2024-04-30: 40 meq via ORAL
  Filled 2024-04-30: qty 2

## 2024-04-30 NOTE — Progress Notes (Addendum)
 PROGRESS NOTE                                                                                                                                                                                                             Patient Demographics:    Cody Glenn, is a 68 y.o. male, DOB - 12/04/1955, FMW:[id number]  Outpatient Primary MD for the patient is [name], MD    LOS - 14  Admit date - 04/15/2024    Chief Complaint  Patient presents with   Hypoglycemia   Alcohol Intoxication       Brief Narrative (HPI from H&P)      68 y.o. year old male with past medical history of heavy alcohol use, DVT, PUD, history of partial gastrectomy, active smoking, prediabetes, primary hypertension, cervical spinal stenosis, and homelessness.  He presents to [name] ED after being found by a bystander in a parking lot.  On EMS arrival patient was hypoglycemic and was given an amp of D50.  In the ER workup consistent with hyponatremia, dehydration with multiple electrolyte abnormalities.  Chest x-ray also showed left upper lobe cavitary lesion.  PCCM were consulted, underwent on Koska P, and antibiotics adjusted according to culture, on 9/20 patient had rapid decompensation in respiratory status with workup significant for acute heart failure.   Significant Hospital Events:  9/9 admitted 9/10 pulm consult, GI panel positive for EPEC and Shigella 9/11 CT Surgery consult, no need for surgery at this time 9/12 bronchoscopy with BAL performed, BAL culture growing acinetobacter 9/20 PCCM re-consulted for acute worsening in respiratory status, concern for acute CHF exacerbation. Regular diet resumed 9/16 by Speech   Subjective:   Reports dyspnea has improved, denies any chest pain.   Assessment  & Plan :    Acute systolic CHF Acute hypoxemic respiratory failure (pO2 of 68) Bilateral pleural effusion Lactic acidosis -Echo in July  2025 with preserved EF, undetermined diastolic dysfunction - Developed significant dyspnea with evidence of volume overload on imaging and BNP significantly elevated .  2D echo admission significant for new systolic CHF with cardiomyopathy.  EF of 10% - CTA chest negative for PE . - Recurrent bilateral pleural effusion, post right thoracentesis 1.2 L drained yesterday, workup significant for transudate . - Troponins non-ACS pattern 35>>36> >35 - Cardiology input greatly appreciated - Continue with  IV diuresis, strict ins and outs and daily weight  Alcohol abuse, with withdrawals -On presentation, treated with CIWA protocol.  Now off withdrawal window, will DC CIWA - Continue with thiamine , folic acid  and multivitamin  Dehydration Hyponatremia Hypomagnesemia Hypophosphatemia - Due to above, continue to monitor closely and replete as needed  Sepsis, POA Multifocal pneumonia Persistent left upper lung cavitary lesions -  Homeless, alcoholic, high risk for chronic aspiration, sepsis workup initiated, IV fluids, pancultured, CT chest confirms left-sided cavitary lesion questionable abscess, he has had pulmonary evaluation for the same few weeks ago and had received antibiotics at that time. - Seen by PCCM, BAL cultures finally came back  ACINETOBACTER CALCOACETICUS/BAUMANNII COMPLEX recommendations for 2 weeks of IV Unasyn , started 9/10, stop date 9/24. - Encouraged to use incentive spirometry and flutter valve  Shigella gastroenteritis with diarrhea. - 7 days of azithromycin .  Protein Calorie Malnutrition   -Dietitian consulted. -Will start on mirtazapine    QTc- avoid prolonging medications -Target potassium> 4, magnesium > 2  Hospital delirium - Patient improved this morning, not restless or agitated anymore, will stop Seroquel  and start on Remeron   Tobacco Use Disorder - Patient counseled on importance of cessation - Nicotine  replacement therapy with nicotine  patch and gum while  inpatient  Normocytic Anemia Stable   COPD Not on any outpatient maintenance inhalers -PRN Albuterol    Hypertension - Patient reports he is not taking Diovan  - BP normotensive, monitor   #Peptic Ulcer Disease - Protonix    #Chronic Neck Pain - Lidocaine  patch - PRN toradol   Hypokalemia - Replaced  #Normocytic Anemia Stable   #COPD Not on any outpatient maintenance inhalers -PRN Albuterol    #Hypertension - Patient reports he is not taking Diovan  - BP normotensive, monitor     #Peptic Ulcer Disease - Protonix    #Chronic Neck Pain - Lidocaine  patch - PRN toradol   ## Mildly elevated free T4 levels with stable TSH.  Repeat TSH and free T4 in 2 to 3 weeks by PCP   DM type II with hypoglycemia upon admission - Likely due to poor oral intake, A1c was low, stable TSH and random cortisol, has low CBGs, so we will discontinue with insulin  sliding scale     Lab Results  Component Value Date   HGBA1C 4.1 (L) 04/16/2024   CBG (last 3)  Recent Labs    04/30/24 1211 04/30/24 1257 04/30/24 1326  GLUCAP 68* 55* 162*        Condition - Extremely Guarded  Family Communication  : None present, discussed with brother 9/21  Code Status : Full code  Consults  : Pulmonary, nephrology  PUD Prophylaxis :  PPI   Procedures  :     Date:04/19/24    Provider Performing:[name]    Procedure(s):  Flexible bronchoscopy with bronchial alveolar lavage   CT chest with IV contrast ordered 04/17/2024 - 1. Increasing size of the thick walled cavitary lesion within the left upper lobe, consistent with progressive cavitary pneumonia. While the soft tissue nodule within the inferior aspect of the lesion is no longer visualized, there has been marked progression in the mural thickening seen along the inferior extent, consistent with progressive infection. There is communication between this cavitary lesion and a subsegmental left upper lobe bronchus as described above. 2.  Worsening ground-glass airspace disease within the lingula and left lower lobe, compatible with worsening infection. 3. Trace bilateral pleural effusions, left greater than right. 4. Aortic Atherosclerosis (ICD10-I70.0) and Emphysema (ICD10-J43.9).  Disposition Plan  :    Status is: Inpatient  DVT Prophylaxis  :    heparin  injection 5,000 Units Start: 04/30/24 0600 Place and maintain sequential compression device Start: 04/16/24 1157    Lab Results  Component Value Date   PLT 286 04/30/2024    Diet :  Diet Order             Diet regular Room service appropriate? Yes with Assist; Fluid consistency: Thin  Diet effective now                    Inpatient Medications  Scheduled Meds:  vitamin B-12  1,000 mcg Oral Daily   feeding supplement  1 Container Oral TID BM   folic acid   1 mg Oral Daily   furosemide   80 mg Intravenous BID   heparin  injection (subcutaneous)  5,000 Units Subcutaneous Q8H   insulin  aspart  0-9 Units Subcutaneous TID WC   lidocaine   1 patch Transdermal Q24H   multivitamin with minerals  1 tablet Oral Daily   nicotine   14 mg Transdermal Daily   pantoprazole   40 mg Oral Daily   potassium chloride   20 mEq Oral BID   thiamine   100 mg Oral Daily   Continuous Infusions:  meropenem  (MERREM ) IV 1 g (04/30/24 0527)   PRN Meds:.acetaminophen  **OR** acetaminophen , dextrose , levalbuterol , melatonin, nicotine  polacrilex, polyethylene glycol  Antibiotics  :    Anti-infectives (From admission, onward)    Start     Dose/Rate Route Frequency Ordered Stop   04/27/24 1800  meropenem  (MERREM ) 1 g in sodium chloride  0.9 % 100 mL IVPB        1 g 200 mL/hr over 30 Minutes Intravenous Every 8 hours 04/27/24 1526 05/01/24 2359   04/22/24 1045  linezolid  (ZYVOX ) tablet 600 mg  Status:  Discontinued        600 mg Oral Every 12 hours 04/22/24 0948 04/25/24 0947   04/18/24 1100  azithromycin  (ZITHROMAX ) tablet 500 mg        500 mg Oral Daily 04/18/24 1002  04/24/24 0921   04/17/24 1200  Ampicillin -Sulbactam (UNASYN ) 3 g in sodium chloride  0.9 % 100 mL IVPB  Status:  Discontinued        3 g 200 mL/hr over 30 Minutes Intravenous Every 6 hours 04/17/24 1025 04/27/24 1526         Objective:   Vitals:   04/30/24 0339 04/30/24 0500 04/30/24 0727 04/30/24 1216  BP:    (!) 142/95  Pulse:      Resp:      Temp:    (!) 97.4 F (36.3 C)  TempSrc:   Oral Oral  SpO2: 100%     Weight:  56.9 kg    Height:        Wt Readings from Last 3 Encounters:  04/30/24 56.9 kg  03/18/24 69 kg  02/20/24 69 kg     Intake/Output Summary (Last 24 hours) at 04/30/2024 1405 Last data filed at 04/30/2024 0647 Gross per 24 hour  Intake --  Output 1850 ml  Net -1850 ml       Physical Exam  Awake, alert, extremely frail, ill-appearing Diminished air entry at the bases, coarse respiratory sounds left> right Tachycardic, no rubs or gallops +ve B.Sounds, Abd Soft, No tenderness, No rebound - guarding or rigidity. No Cyanosis, Clubbing, trace edema, No new Rash or bruise           Data Review:    Recent Labs  Lab 04/26/24 0318 04/27/24 1039 04/28/24 0240 04/29/24 0311 04/30/24 0226  WBC 5.1 5.8 3.3* 6.0 10.2  HGB 8.5* 9.4* 8.9* 8.8* 9.3*  HCT 25.1* 29.9* 27.2* 27.3* 29.1*  PLT 343 367 304 327 286  MCV 99.6 104.9* 101.1* 103.4* 103.6*  MCH 33.7 33.0 33.1 33.3 33.1  MCHC 33.9 31.4 32.7 32.2 32.0  RDW 14.1 14.6 14.7 15.3 15.2    Recent Labs  Lab 04/23/24 2358 04/26/24 0318 04/27/24 1039 04/27/24 1729 04/27/24 1933 04/27/24 2233 04/28/24 0240 04/29/24 0311 04/30/24 0226  NA 133* 142 142  --   --   --  143 144 142  K 4.2 3.4* 4.2  --   --   --  4.2 3.7 3.3*  CL 103 108 110  --   --   --  112* 108 106  CO2 21* 20* 18*  --   --   --  20* 22 23  ANIONGAP 9 14 14   --   --   --  11 14 13   GLUCOSE 125* 135* 115*  --   --   --  145* 117* 85  BUN 8 8 11   --   --   --  13 17 14   CREATININE 0.71 0.66 0.74  --   --   --  0.82 0.78 0.81   DDIMER  --   --   --   --  1.09*  --   --   --   --   PROCALCITON  --   --  <0.10  --   --   --   --   --   --   LATICACIDVEN 1.8  --   --  3.6* 3.4* 2.7*  --   --   --   BNP  --   --  3,496.3*  --   --   --  3,842.6* 4,294.9* 4,223.7*  MG 1.8 1.7 2.0  --   --   --  2.0 2.0 1.8  PHOS 4.0 3.1 4.4  --   --   --  4.5 5.2* 3.7  CALCIUM  7.9* 8.2* 8.6*  --   --   --  8.7* 8.6* 8.3*      Recent Labs  Lab 04/23/24 2358 04/26/24 0318 04/27/24 1039 04/27/24 1729 04/27/24 1933 04/27/24 2233 04/28/24 0240 04/29/24 0311 04/30/24 0226  DDIMER  --   --   --   --  1.09*  --   --   --   --   PROCALCITON  --   --  <0.10  --   --   --   --   --   --   LATICACIDVEN 1.8  --   --  3.6* 3.4* 2.7*  --   --   --   BNP  --   --  3,496.3*  --   --   --  3,842.6* 4,294.9* 4,223.7*  MG 1.8 1.7 2.0  --   --   --  2.0 2.0 1.8  CALCIUM  7.9* 8.2* 8.6*  --   --   --  8.7* 8.6* 8.3*    Lab Results  Component Value Date   CHOL 213 (H) 10/13/2020   HDL 106 10/13/2020   LDLCALC 93 10/13/2020   TRIG 84 10/13/2020   CHOLHDL 2.0 10/13/2020    Lab Results  Component Value Date   HGBA1C 4.1 (L) 04/16/2024   No results for input(s): TSH, T4TOTAL, FREET4, T3FREE, THYROIDAB in the last 72 hours.  No results for input(s): VITAMINB12, FOLATE, FERRITIN, TIBC, IRON, RETICCTPCT in the last 72 hours. ------------------------------------------------------------------------------------------------------------------ Cardiac Enzymes No results for input(s): CKMB, TROPONINI, MYOGLOBIN in the last 168 hours.  Invalid input(s): CK  Micro Results Recent Results (from the past 240 hours)  Body fluid culture w Gram Stain     Status: None (Preliminary result)   Collection Time: 04/29/24  3:49 PM   Specimen: Pleural Fluid  Result Value Ref Range Status   Specimen Description FLUID PLEURAL  Final   Special Requests NONE  Final   Gram Stain NO WBC SEEN NO ORGANISMS SEEN CYTOSPIN SMEAR    Final   Culture   Final    NO GROWTH < 24 HOURS Performed at [facility], [address] [place], [place] [address]    Report Status PENDING  Incomplete    Radiology Report DG CHEST PORT 1 VIEW Result Date: 04/29/2024 CLINICAL DATA:  Status post thoracentesis EXAM: PORTABLE CHEST 1 VIEW COMPARISON:  Chest x-ray 04/26/2024 FINDINGS: There small bilateral pleural effusions similar to the prior study. There is no pneumothorax. Heart is enlarged, unchanged. There central pulmonary vascular congestion. Patchy left upper lobe airspace opacities persist. The osseous structures are stable. IMPRESSION: 1. Small bilateral pleural effusions similar to the prior study. No pneumothorax. 2. Cardiomegaly with central pulmonary vascular congestion. 3. Patchy left upper lobe airspace opacities persist. Electronically Signed   By: Greig Pique M.D.   On: 04/29/2024 17:06   ECHOCARDIOGRAM COMPLETE Result Date: 04/29/2024    ECHOCARDIOGRAM REPORT   Patient Name:   Cody Glenn Date of Exam: 04/29/2024 Medical Rec #:  [id number]         Height:       65.0 in Accession #:    [id number]        Weight:       149.9 lb Date of Birth:  [birth year]          BSA:          1.750 m Patient Age:    68 years          BP:           123/98 mmHg Patient Gender: M                 HR:           103 bpm. Exam Location:  Inpatient Procedure: 2D Echo and Intracardiac Opacification Agent (Both Spectral and Color            Flow Doppler were utilized during procedure). Indications:    Dyspnea, CHF  History:        Patient has prior history of Echocardiogram examinations. Risk                 Factors:Hypertension.  Sonographer:    Charmaine Gaskins Referring Phys: 67 [address] [place] S Beauregard Jarrells IMPRESSIONS  1. Left ventricular ejection fraction, by estimation, is 10%. The left ventricle has severely decreased function. The left ventricle demonstrates global hypokinesis. The left ventricular internal cavity size was mildly to moderately dilated.  VTI is 10cm  concerning for low output heart failure. There appears to be exagerrated hypokinesis of the mid to distal LV / LV apex in an LAD territory. Cannot rule out underlying CAD.  2. Right ventricular systolic function is mildly reduced. The right ventricular size is normal.  3. The mitral valve is normal in structure. No evidence of mitral valve regurgitation.  4. The aortic valve is normal in  structure. Aortic valve regurgitation is not visualized.  5. Left atrial size was moderately dilated. FINDINGS  Left Ventricle: Left ventricular ejection fraction, by estimation, is <20%. The left ventricle has severely decreased function. The left ventricle demonstrates regional wall motion abnormalities. Definity  contrast agent was given IV to delineate the left ventricular endocardial borders. The left ventricular internal cavity size was mildly to moderately dilated. There is no left ventricular hypertrophy. Indeterminate diastolic filling due to E-A fusion. Right Ventricle: The right ventricular size is normal. No increase in right ventricular wall thickness. Right ventricular systolic function is mildly reduced. Left Atrium: Left atrial size was moderately dilated. Right Atrium: Right atrial size was normal in size. Pericardium: There is no evidence of pericardial effusion. Mitral Valve: The mitral valve is normal in structure. No evidence of mitral valve regurgitation. Tricuspid Valve: The tricuspid valve is normal in structure. Tricuspid valve regurgitation is trivial. Aortic Valve: The aortic valve is normal in structure. Aortic valve regurgitation is not visualized. Pulmonic Valve: The pulmonic valve was normal in structure. Pulmonic valve regurgitation is trivial. Aorta: The aortic root is normal in size and structure. IAS/Shunts: No atrial level shunt detected by color flow Doppler.  LEFT VENTRICLE PLAX 2D LVIDd:         5.12 cm LVIDs:         4.46 cm LV PW:         0.95 cm LV IVS:        0.96 cm LVOT diam:      2.13 cm LV SV:         29 LV SV Index:   17 LVOT Area:     3.56 cm  LV Volumes (MOD) LV vol d, MOD A2C: 207.0 ml LV vol d, MOD A4C: 193.0 ml LV vol s, MOD A2C: 170.0 ml LV vol s, MOD A4C: 156.0 ml LV SV MOD A2C:     37.0 ml LV SV MOD A4C:     193.0 ml LV SV MOD BP:      35.0 ml RIGHT VENTRICLE RV Basal diam:  3.30 cm RV Mid diam:    2.72 cm RV S prime:     12.00 cm/s TAPSE (M-mode): 1.3 cm LEFT ATRIUM             Index        RIGHT ATRIUM           Index LA diam:        3.84 cm 2.19 cm/m   RA Area:     15.10 cm LA Vol (A2C):   77.3 ml 44.17 ml/m  RA Volume:   39.40 ml  22.51 ml/m LA Vol (A4C):   75.5 ml 43.14 ml/m LA Biplane Vol: 76.0 ml 43.43 ml/m  AORTIC VALVE LVOT Vmax:   61.60 cm/s LVOT Vmean:  40.300 cm/s LVOT VTI:    0.082 m  SHUNTS Systemic VTI:  0.08 m Systemic Diam: 2.13 cm Aditya Sabharwal Electronically signed by Ria Commander Signature Date/Time: 04/29/2024/2:33:25 PM    Final        Signature  GLENWOOD Brayton Lye M.D on 04/30/2024 at 2:05 PM   -  To page go to [email or web address]

## 2024-04-30 NOTE — Progress Notes (Signed)
 Heart Failure Nurse Navigator Progress Note    Patient currently working with therapy. Will return to do Heart Failure education and schedule a HF TOC appointment.   Stephane Haddock, BSN, Scientist, clinical (histocompatibility and immunogenetics) Only

## 2024-04-30 NOTE — Progress Notes (Signed)
 Heart Failure Nurse Navigator Progress Note  PCP: [name], MD PCP-Cardiologist: None Admission Diagnosis: Hyponatremia and Alcohol abuse.  Admitted from: parking Lot via EMS  Presentation:   RICCO DERSHEM presented with intoxication from a parking lot, was hypoglycemic, given a amp of D50 from EMS. Reports he is a daily drinker of between 12-24 beers daily. BP 136/80, HR 85, BNP 4,294, Trop 36, sodium 112,  Lactic 3.6. Alcohol 177, CBG 15. Asymptomatic. Septic with multifocal pneumonia. He developed diarrhea and was found to have Shigella. echocardiogram was performed which showed an LVEF of 10% suggestive of low output heart failure with no LV thrombus. On 9/22 had thoracentesis removing about 1.2 L of fluid from the right pleural space. He was discussed with the advanced heart failure service and not felt to be a candidate for advanced therapies.   Patient was educated on the sign and symptoms of heart failure, daily weights, when to call his doctor or go to the ED, Diet/ fluid restrictions, Per notes patient drinks about 12-24 beers daily, however he stated to navigator only about 1/5 of liquor every 5 days. Continued education on taking all his medications as prescribed and attending all medical appointments. Patient verbalized his understanding of education. A HF TOC appointment was scheduled for 05/08/2024 @ 9am. Patient stated that upon discharge he was going to go back to his friends house and stay there, and he would bring him to his appointment.   ECHO/ LVEF: 10%  Clinical Course:  Past Medical History:  Diagnosis Date   Alcohol use    Anemia    Cavitary lung disease    Cirrhosis (HCC)    Homelessness    Hyperbilirubinemia 07/01/2020   Peptic ulcer 08/09/1987   Jul 28, 2008 Entered By: KATHERINE ETHA HERO Comment: s/p partial gastrectomy   Thoracic ascending aortic aneurysm    Thrombocytopenia    Tobacco abuse      Social History   Socioeconomic History    Marital status: Single    Spouse name: Not on file   Number of children: Not on file   Years of education: Not on file   Highest education level: Not on file  Occupational History   Not on file  Tobacco Use   Smoking status: Every Day    Current packs/day: 1.00    Average packs/day: 1 pack/day for 40.0 years (40.0 ttl pk-yrs)    Types: Cigarettes   Smokeless tobacco: Never  Vaping Use   Vaping status: Never Used  Substance and Sexual Activity   Alcohol use: Yes    Comment: lots on 06/29/20   Drug use: Not Currently   Sexual activity: Not on file  Other Topics Concern   Not on file  Social History Narrative   Not on file   Social Drivers of Health   Financial Resource Strain: Not on file  Food Insecurity: Food Insecurity Present (04/17/2024)   Hunger Vital Sign    Worried About Running Out of Food in the Last Year: Often true    Ran Out of Food in the Last Year: Often true  Transportation Needs: Unmet Transportation Needs (04/17/2024)   PRAPARE - Administrator, Civil Service (Medical): Yes    Lack of Transportation (Non-Medical): Yes  Physical Activity: Not on file  Stress: Not on file  Social Connections: Moderately Isolated (04/17/2024)   Social Connection and Isolation Panel    Frequency of Communication with Friends and Family: Once a week  Frequency of Social Gatherings with Friends and Family: Three times a week    Attends Religious Services: 1 to 4 times per year    Active Member of Clubs or Organizations: No    Attends Banker Meetings: Never    Marital Status: Never married   Education Assessment and Provision:  Detailed education and instructions provided on heart failure disease management including the following:  Signs and symptoms of Heart Failure When to call the physician Importance of daily weights Low sodium diet Fluid restriction Medication management Anticipated future follow-up appointments  Patient education  given on each of the above topics.  Patient acknowledges understanding via teach back method and acceptance of all instructions.  Education Materials:  Living Better With Heart Failure Booklet, HF zone tool, & Daily Weight Tracker Tool.  Patient has scale at home: yes Patient has pill box at home: NA    High Risk Criteria for Readmission and/or Poor Patient Outcomes: Heart failure hospital admissions (last 6 months): 0  No Show rate: 14 %  Difficult social situation: Patient states he was staying with a friend prior to admission and he was going back to his house at discharge.  Demonstrates medication adherence: No  Primary Language: English Literacy level: Reading, writing  Barriers of Care:   Alcohol abuse Diet/ fluid restrictions Daily weights Medication compliance  Considerations/Referrals:   Referral made to Heart Failure Pharmacist Stewardship: NA Referral made to Heart Failure CSW/NCM TOC: NA Referral made to Heart & Vascular TOC clinic: Yes, 05/08/2024 @ 9 am   Items for Follow-up on DC/TOC: Alcohol cessation Diet/ fluid restrictions/ daily weights Medication compliance   Stephane Haddock, BSN, RN Heart Failure Teacher, adult education Only

## 2024-04-30 NOTE — Progress Notes (Signed)
 NAME:  Cody Glenn, MRN:  [id number], DOB:  05/04/1956, LOS: 14 ADMISSION DATE:  04/15/2024, CONSULTATION DATE:  04/17/2024 REFERRING MD:  Dr. Dennise, CHIEF COMPLAINT:  Abnormal CXR   History of Present Illness:  68 yo M PMH etoh abuse, dvt, DM, HTN, cavitary lung lesion who was admitted to [facility] 04/16/24 after presenting to ED for AMS. He was found to be hypoglycemic which was addressed, and have AoC hyponatremia for which nephrology was consulted. A CXR was obtained 9/10 which reveals LUL cavitary lesion, and air concerning for possible BPF.    PCCM consulted in this setting.   He has a known LUL cavitary lesion that was worked up on prior admission, and was d/c in July on 3 wk course of Augmentin . He reports to me he completed this. No resp complaints. Does endorse frequent coughing when he swallows thin liquids, specifically noting that he coughs a lot when he drinks alcohol.   Pertinent  Medical History  EtOH use disorder Cavitary lung lesion Malnutrition DVT HTN   Significant Hospital Events: Including procedures, antibiotic start and stop dates in addition to other pertinent events   9/9 - Admitted 9/10 - Pulm consult, GI panel positive for EPEC and Shigella 9/11 - CT Surgery consult, no need for surgery at this time 9/12 - Bronchoscopy with BAL performed 9/22 - Thoracentesis performed   Interim History / Subjective:  Weaned to room air Reports improved SOB Excellent UOP with diuresis  Objective    Blood pressure (!) 142/95, pulse 89, temperature (!) 97.4 F (36.3 C), temperature source Oral, resp. rate 16, height 5' 5 (1.651 m), weight 56.9 kg, SpO2 100%.        Intake/Output Summary (Last 24 hours) at 04/30/2024 1504 Last data filed at 04/30/2024 0647 Gross per 24 hour  Intake --  Output 1850 ml  Net -1850 ml   Filed Weights   04/15/24 2033 04/19/24 1154 04/30/24 0500  Weight: 68 kg 68 kg 56.9 kg   Physical Exam: General: Chronically ill-appearing,  cachectic, no acute distress HENT: [place], AT, OP clear, MMM Eyes: EOMI, no scleral icterus Respiratory: Diminished but improved aeration on right side. Decreased air entry on left base.  No crackles, wheezing or rales Cardiovascular: RRR, -M/R/G, no JVD GI: BS+, soft, nontender Extremities:-Edema,-tenderness Neuro: AAO x4, CNII-XII grossly intact     Assessment and Plan   Persistent LUL cavitary lung lesion Acinetobacter pneumonia Aspiration Risk, High Alcohol Use Disorder Multifocal ASD Suspected dysphagia EPEC/Shigella gastroenteritis Severe HFrEF, cardiomyopathy  Echo showing LVEF 10% with severely decreased function and global hypokinesis, consistent with severe HFrEF and new cardiomyopathy. CTA negative for PE but with new air-fluid level within fluid collection now involving cavitary lesion at LL apex. S/p thoracentesis 9/22 with no recurrence seen on bedside ultrasound except for very small right pleural effusion. Decreased moderate left pleural effusion  Plan - Acinetobacter growth from BAL culture sensitive to meropenem  - Continue meropenem  (9/21>), avoid Unasyn  given high salt load from antibiotic given current concern for heart failure - S/p thoracentesis with 1200 CCs straw colored fluid removed consistent with transudative, likely related to heart failure  - No indication for thoracentesis. Would reconsider in the future if worsening hypoxemia or for palliative reasons. - Good response to diuresis however concerned about long term goals related to his severe heart failure. Recommend palliative consult.   Critical care time: N/A   Care Time: 35 min  Slater Staff, M.D. [facility]: [facility] Pulmonary/Critical Care Medicine 04/30/2024 3:05 PM  See Amion for personal pager For hours between 7 PM to 7 AM, please call Elink for urgent questions

## 2024-04-30 NOTE — Inpatient Diabetes Management (Signed)
 Inpatient Diabetes Program Recommendations  AACE/ADA: New Consensus Statement on Inpatient Glycemic Control (2015)  Target Ranges:  Prepandial:   less than 140 mg/dL      Peak postprandial:   less than 180 mg/dL (1-2 hours)      Critically ill patients:  140 - 180 mg/dL   Lab Results  Component Value Date   GLUCAP 162 (H) 04/30/2024   HGBA1C 4.1 (L) 04/16/2024    Review of Glycemic Control  Latest Reference Range & Units 04/29/24 16:44 04/29/24 22:10 04/29/24 22:46 04/30/24 07:28 04/30/24 08:20 04/30/24 12:11 04/30/24 12:57 04/30/24 13:26  Glucose-Capillary 70 - 99 mg/dL 80 63 (L) 871 (H) 37 (LL) 93 68 (L) 55 (L) 162 (H)  (LL): Data is critically low (L): Data is abnormally low (H): Data is abnormally high   Outpatient Diabetes medications: None Current orders for Inpatient glycemic control: Novolog  0-9 units TID  Inpatient Diabetes Program Recommendations:    Please discontinue Novolog  0-9 units TID.  Thank you, Wyvonna Pinal, MSN, CDCES Diabetes Coordinator Inpatient Diabetes Program [phone] (team pager from 8a-5p)

## 2024-04-30 NOTE — Plan of Care (Signed)
  Problem: Education: Goal: Knowledge of General Education information will improve Description: Including pain rating scale, medication(s)/side effects and non-pharmacologic comfort measures Outcome: Progressing   Problem: Clinical Measurements: Goal: Ability to maintain clinical measurements within normal limits will improve Outcome: Progressing Goal: Will remain free from infection Outcome: Progressing Goal: Diagnostic test results will improve Outcome: Progressing Goal: Cardiovascular complication will be avoided Outcome: Progressing   Problem: Activity: Goal: Risk for activity intolerance will decrease Outcome: Progressing   Problem: Coping: Goal: Level of anxiety will decrease Outcome: Progressing

## 2024-04-30 NOTE — Progress Notes (Signed)
 DAILY PROGRESS NOTE   Patient Name: Cody Glenn Date of Encounter: 04/30/2024 Cardiologist: None  Chief Complaint   No complaints  Patient Profile   Cody Glenn is a 68 y.o. male with a hx of alcoholism, tobacco abuse, cavitary lung lesion, DVT, PUD, pre-DM, HTN, spinal stenosis, cirrhosis, ascending aortic aneurysm (4.5cm by CT this admission), thrombocytopenia, chronic appearing anemia, homelessness who is being seen 04/29/2024 for the evaluation of CHF at the request of Dr. Sherlon.   Subjective   No issues overnight. Diuresed another 2.7L negative. Potassium 3.3 today. BNP 4223. Creatinine 0.81.  Objective   Vitals:   04/30/24 0338 04/30/24 0339 04/30/24 0500 04/30/24 0727  BP: 110/89     Pulse:      Resp: 16     Temp: 98.6 F (37 C)     TempSrc: Oral   Oral  SpO2:  100%    Weight:   56.9 kg   Height:        Intake/Output Summary (Last 24 hours) at 04/30/2024 0912 Last data filed at 04/30/2024 9352 Gross per 24 hour  Intake --  Output 2750 ml  Net -2750 ml   Filed Weights   04/15/24 2033 04/19/24 1154 04/30/24 0500  Weight: 68 kg 68 kg 56.9 kg    Physical Exam   General appearance: alert, cachectic, and no distress Lungs: diminished breath sounds LLL Heart: regular rate and rhythm, S1, S2 normal, no murmur, click, rub or gallop Extremities: extremities normal, atraumatic, no cyanosis or edema Neurologic: Grossly normal  Inpatient Medications    Scheduled Meds:  vitamin B-12  1,000 mcg Oral Daily   feeding supplement  1 Container Oral TID BM   folic acid   1 mg Oral Daily   furosemide   80 mg Intravenous BID   heparin  injection (subcutaneous)  5,000 Units Subcutaneous Q8H   insulin  aspart  0-9 Units Subcutaneous TID WC   lidocaine   1 patch Transdermal Q24H   multivitamin with minerals  1 tablet Oral Daily   nicotine   14 mg Transdermal Daily   pantoprazole   40 mg Oral Daily   potassium chloride   20 mEq Oral BID   thiamine   100 mg Oral  Daily   Or   thiamine   100 mg Intravenous Daily    Continuous Infusions:  magnesium  sulfate bolus IVPB     meropenem  (MERREM ) IV 1 g (04/30/24 0527)    PRN Meds: acetaminophen  **OR** acetaminophen , dextrose , levalbuterol , melatonin, nicotine  polacrilex, polyethylene glycol   Labs   Results for orders placed or performed during the hospital encounter of 04/15/24 (from the past 48 hours)  Glucose, capillary     Status: Abnormal   Collection Time: 04/28/24  1:05 PM  Result Value Ref Range   Glucose-Capillary 122 (H) 70 - 99 mg/dL    Comment: Glucose reference range applies only to samples taken after fasting for at least 8 hours.  Glucose, capillary     Status: Abnormal   Collection Time: 04/28/24  4:15 PM  Result Value Ref Range   Glucose-Capillary 165 (H) 70 - 99 mg/dL    Comment: Glucose reference range applies only to samples taken after fasting for at least 8 hours.  Glucose, capillary     Status: Abnormal   Collection Time: 04/28/24 10:08 PM  Result Value Ref Range   Glucose-Capillary 145 (H) 70 - 99 mg/dL    Comment: Glucose reference range applies only to samples taken after fasting for at least 8 hours.  CBC     Status: Abnormal   Collection Time: 04/29/24  3:11 AM  Result Value Ref Range   WBC 6.0 4.0 - 10.5 K/uL   RBC 2.64 (L) 4.22 - 5.81 MIL/uL   Hemoglobin 8.8 (L) 13.0 - 17.0 g/dL   HCT 72.6 (L) 60.9 - 47.9 %   MCV 103.4 (H) 80.0 - 100.0 fL   MCH 33.3 26.0 - 34.0 pg   MCHC 32.2 30.0 - 36.0 g/dL   RDW 84.6 88.4 - 84.4 %   Platelets 327 150 - 400 K/uL   nRBC 0.3 (H) 0.0 - 0.2 %    Comment: Performed at [facility], [address], [place], [place] [address]  Magnesium      Status: None   Collection Time: 04/29/24  3:11 AM  Result Value Ref Range   Magnesium  2.0 1.7 - 2.4 mg/dL    Comment: Performed at [facility], [address] [place], [place] [address]  Basic metabolic panel with GFR     Status: Abnormal   Collection Time: 04/29/24  3:11 AM   Result Value Ref Range   Sodium 144 135 - 145 mmol/L   Potassium 3.7 3.5 - 5.1 mmol/L   Chloride 108 98 - 111 mmol/L   CO2 22 22 - 32 mmol/L   Glucose, Bld 117 (H) 70 - 99 mg/dL    Comment: Glucose reference range applies only to samples taken after fasting for at least 8 hours.   BUN 17 8 - 23 mg/dL   Creatinine, Ser 9.21 0.61 - 1.24 mg/dL   Calcium  8.6 (L) 8.9 - 10.3 mg/dL   GFR, Estimated >39 >39 mL/min    Comment: (NOTE) Calculated using the CKD-EPI Creatinine Equation (2021)    Anion gap 14 5 - 15    Comment: Performed at [facility], [address], [place], [place] [address]  Phosphorus     Status: Abnormal   Collection Time: 04/29/24  3:11 AM  Result Value Ref Range   Phosphorus 5.2 (H) 2.5 - 4.6 mg/dL    Comment: Performed at [facility], [address], [place], [place] [address]  Brain natriuretic peptide     Status: Abnormal   Collection Time: 04/29/24  3:11 AM  Result Value Ref Range   B Natriuretic Peptide 4,294.9 (H) 0.0 - 100.0 pg/mL    Comment: Performed at [facility], [address] [place], [place] [address]  Glucose, capillary     Status: None   Collection Time: 04/29/24  7:41 AM  Result Value Ref Range   Glucose-Capillary 86 70 - 99 mg/dL    Comment: Glucose reference range applies only to samples taken after fasting for at least 8 hours.  Troponin I (High Sensitivity)     Status: Abnormal   Collection Time: 04/29/24  9:42 AM  Result Value Ref Range   Troponin I (High Sensitivity) 36 (H) <18 ng/L    Comment: (NOTE) Elevated high sensitivity troponin I (hsTnI) values and significant  changes across serial measurements may suggest ACS but many other  chronic and acute conditions are known to elevate hsTnI results.  Refer to the Links section for chest pain algorithms and additional  guidance. Performed at [facility], [address], [place], [place] [address]   Glucose, capillary     Status: None   Collection Time:  04/29/24 11:59 AM  Result Value Ref Range   Glucose-Capillary 77 70 - 99  mg/dL    Comment: Glucose reference range applies only to samples taken after fasting for at least 8 hours.  Body fluid cell count with differential     Status: Abnormal   Collection Time: 04/29/24  3:42 PM  Result Value Ref Range   Fluid Type-FCT PLEURAL RT FL    Color, Fluid STRAW (A) YELLOW   Appearance, Fluid HAZY (A) CLEAR   Total Nucleated Cell Count, Fluid 299 0 - 1,000 cu mm   Neutrophil Count, Fluid 13 0 - 25 %   Lymphs, Fluid 77 %   Monocyte-Macrophage-Serous Fluid 9 (L) 50 - 90 %   Eos, Fluid 1 %   Other Cells, Fluid OTHER CELLS IDENTIFIED AS MESOTHELIAL CELLS %    Comment: Performed at [facility], [address] [place], [place] [address]  Glucose, pleural or peritoneal fluid     Status: None   Collection Time: 04/29/24  3:42 PM  Result Value Ref Range   Glucose, Fluid 142 mg/dL    Comment: (NOTE) No normal range established for this test Results should be evaluated in conjunction with serum values    Fluid Type-FGLU PLEURAL RT FL     Comment: Performed at [facility], [address], [place], [place] [address]  Lactate dehydrogenase (pleural or peritoneal fluid)     Status: Abnormal   Collection Time: 04/29/24  3:42 PM  Result Value Ref Range   LD, Fluid 34 (H) 3 - 23 U/L    Comment: (NOTE) Results should be evaluated in conjunction with serum values    Fluid Type-FLDH PLEURAL RT FL     Comment: Performed at [facility], [address], [place], [place] [address]  Protein, pleural or peritoneal fluid     Status: None   Collection Time: 04/29/24  3:42 PM  Result Value Ref Range   Total protein, fluid <3.0 g/dL    Comment: (NOTE) No normal range established for this test Results should be evaluated in conjunction with serum values    Fluid Type-FTP PLEURAL RT FL     Comment: Performed at [facility], [address], [place], [place] [address]  Body fluid  culture w Gram Stain     Status: None (Preliminary result)   Collection Time: 04/29/24  3:49 PM   Specimen: Pleural Fluid  Result Value Ref Range   Specimen Description FLUID PLEURAL    Special Requests NONE    Gram Stain      NO WBC SEEN NO ORGANISMS SEEN CYTOSPIN SMEAR Performed at [facility], [address], [place], [place] [address]    Culture PENDING    Report Status PENDING   Glucose, capillary     Status: None   Collection Time: 04/29/24  4:44 PM  Result Value Ref Range   Glucose-Capillary 80 70 - 99 mg/dL    Comment: Glucose reference range applies only to samples taken after fasting for at least 8 hours.  Troponin I (High Sensitivity)     Status: Abnormal   Collection Time: 04/29/24  7:13 PM  Result Value Ref Range   Troponin I (High Sensitivity) 61 (H) <18 ng/L    Comment: DELTA CHECK NOTED RESULT CALLED TO, READ BACK BY AND VERIFIED WITH LOIS MANAS, RN @ 2001 04/29/24 BY SEKDAHL (NOTE) Elevated high sensitivity troponin I (hsTnI) values and significant  changes across serial measurements may suggest ACS but many other  chronic and acute conditions are known to  elevate hsTnI results.  Refer to the Links section for chest pain algorithms and additional  guidance. Performed at [facility], [address] [place], [place] [address]   Lactate dehydrogenase     Status: None   Collection Time: 04/29/24  7:13 PM  Result Value Ref Range   LDH 185 98 - 192 U/L    Comment: Performed at [facility], [address], [place], [place] [address]  Protein, total     Status: None   Collection Time: 04/29/24  7:13 PM  Result Value Ref Range   Total Protein 6.7 6.5 - 8.1 g/dL    Comment: Performed at [facility], [address] [place], [place] [address]  Glucose, capillary     Status: Abnormal   Collection Time: 04/29/24 10:10 PM  Result Value Ref Range   Glucose-Capillary 63 (L) 70 - 99 mg/dL    Comment: Glucose reference range applies only to  samples taken after fasting for at least 8 hours.  Glucose, capillary     Status: Abnormal   Collection Time: 04/29/24 10:46 PM  Result Value Ref Range   Glucose-Capillary 128 (H) 70 - 99 mg/dL    Comment: Glucose reference range applies only to samples taken after fasting for at least 8 hours.  CBC     Status: Abnormal   Collection Time: 04/30/24  2:26 AM  Result Value Ref Range   WBC 10.2 4.0 - 10.5 K/uL   RBC 2.81 (L) 4.22 - 5.81 MIL/uL   Hemoglobin 9.3 (L) 13.0 - 17.0 g/dL   HCT 70.8 (L) 60.9 - 47.9 %   MCV 103.6 (H) 80.0 - 100.0 fL   MCH 33.1 26.0 - 34.0 pg   MCHC 32.0 30.0 - 36.0 g/dL   RDW 84.7 88.4 - 84.4 %   Platelets 286 150 - 400 K/uL   nRBC 0.0 0.0 - 0.2 %    Comment: Performed at [facility], [address] [place], [place] [address]  Magnesium      Status: None   Collection Time: 04/30/24  2:26 AM  Result Value Ref Range   Magnesium  1.8 1.7 - 2.4 mg/dL    Comment: Performed at [facility], [address], [place], [place] [address]  Basic metabolic panel with GFR     Status: Abnormal   Collection Time: 04/30/24  2:26 AM  Result Value Ref Range   Sodium 142 135 - 145 mmol/L   Potassium 3.3 (L) 3.5 - 5.1 mmol/L   Chloride 106 98 - 111 mmol/L   CO2 23 22 - 32 mmol/L   Glucose, Bld 85 70 - 99 mg/dL    Comment: Glucose reference range applies only to samples taken after fasting for at least 8 hours.   BUN 14 8 - 23 mg/dL   Creatinine, Ser 9.18 0.61 - 1.24 mg/dL   Calcium  8.3 (L) 8.9 - 10.3 mg/dL   GFR, Estimated >39 >39 mL/min    Comment: (NOTE) Calculated using the CKD-EPI Creatinine Equation (2021)    Anion gap 13 5 - 15    Comment: Performed at [facility] [place] [facility], [address] [place], [place] [address]  Phosphorus     Status: None   Collection Time: 04/30/24  2:26 AM  Result Value Ref Range   Phosphorus 3.7 2.5 - 4.6 mg/dL    Comment: Performed at [facility], [address] [place], [place] [address]  Brain natriuretic peptide  Status: Abnormal   Collection Time: 04/30/24  2:26 AM  Result Value Ref Range   B Natriuretic Peptide 4,223.7 (H) 0.0 - 100.0 pg/mL    Comment: Performed at [facility], [address], [place], [place] [address]  Glucose, capillary     Status: Abnormal   Collection Time: 04/30/24  7:28 AM  Result Value Ref Range   Glucose-Capillary 37 (LL) 70 - 99 mg/dL    Comment: Glucose reference range applies only to samples taken after fasting for at least 8 hours.   Comment 1 Notify RN   Glucose, capillary     Status: None   Collection Time: 04/30/24  8:20 AM  Result Value Ref Range   Glucose-Capillary 93 70 - 99 mg/dL    Comment: Glucose reference range applies only to samples taken after fasting for at least 8 hours.    ECG   N/A  Telemetry   Sinus rhythm - Personally Reviewed  Radiology    DG CHEST PORT 1 VIEW Result Date: 04/29/2024 CLINICAL DATA:  Status post thoracentesis EXAM: PORTABLE CHEST 1 VIEW COMPARISON:  Chest x-ray 04/26/2024 FINDINGS: There small bilateral pleural effusions similar to the prior study. There is no pneumothorax. Heart is enlarged, unchanged. There central pulmonary vascular congestion. Patchy left upper lobe airspace opacities persist. The osseous structures are stable. IMPRESSION: 1. Small bilateral pleural effusions similar to the prior study. No pneumothorax. 2. Cardiomegaly with central pulmonary vascular congestion. 3. Patchy left upper lobe airspace opacities persist. Electronically Signed   By: Greig Pique M.D.   On: 04/29/2024 17:06   ECHOCARDIOGRAM COMPLETE Result Date: 04/29/2024    ECHOCARDIOGRAM REPORT   Patient Name:   Cody Glenn Date of Exam: 04/29/2024 Medical Rec #:  [id number]         Height:       65.0 in Accession #:    [id number]        Weight:       149.9 lb Date of Birth:  1956/01/29          BSA:          1.750 m Patient Age:    68 years          BP:           123/98 mmHg Patient Gender: M                 HR:           103 bpm.  Exam Location:  Inpatient Procedure: 2D Echo and Intracardiac Opacification Agent (Both Spectral and Color            Flow Doppler were utilized during procedure). Indications:    Dyspnea, CHF  History:        Patient has prior history of Echocardiogram examinations. Risk                 Factors:Hypertension.  Sonographer:    Charmaine Gaskins Referring Phys: [id number] [address] [place] IMPRESSIONS  1. Left ventricular ejection fraction, by estimation, is 10%. The left ventricle has severely decreased function. The left ventricle demonstrates global hypokinesis. The left ventricular internal cavity size was mildly to moderately dilated. VTI is 10cm  concerning for low output heart failure. There appears to be exagerrated hypokinesis of the mid to distal LV / LV apex in an LAD territory. Cannot rule out underlying CAD.  2. Right ventricular systolic function is mildly reduced. The right ventricular size is normal.  3.  The mitral valve is normal in structure. No evidence of mitral valve regurgitation.  4. The aortic valve is normal in structure. Aortic valve regurgitation is not visualized.  5. Left atrial size was moderately dilated. FINDINGS  Left Ventricle: Left ventricular ejection fraction, by estimation, is <20%. The left ventricle has severely decreased function. The left ventricle demonstrates regional wall motion abnormalities. Definity  contrast agent was given IV to delineate the left ventricular endocardial borders. The left ventricular internal cavity size was mildly to moderately dilated. There is no left ventricular hypertrophy. Indeterminate diastolic filling due to E-A fusion. Right Ventricle: The right ventricular size is normal. No increase in right ventricular wall thickness. Right ventricular systolic function is mildly reduced. Left Atrium: Left atrial size was moderately dilated. Right Atrium: Right atrial size was normal in size. Pericardium: There is no evidence of pericardial effusion. Mitral Valve:  The mitral valve is normal in structure. No evidence of mitral valve regurgitation. Tricuspid Valve: The tricuspid valve is normal in structure. Tricuspid valve regurgitation is trivial. Aortic Valve: The aortic valve is normal in structure. Aortic valve regurgitation is not visualized. Pulmonic Valve: The pulmonic valve was normal in structure. Pulmonic valve regurgitation is trivial. Aorta: The aortic root is normal in size and structure. IAS/Shunts: No atrial level shunt detected by color flow Doppler.  LEFT VENTRICLE PLAX 2D LVIDd:         5.12 cm LVIDs:         4.46 cm LV PW:         0.95 cm LV IVS:        0.96 cm LVOT diam:     2.13 cm LV SV:         29 LV SV Index:   17 LVOT Area:     3.56 cm  LV Volumes (MOD) LV vol d, MOD A2C: 207.0 ml LV vol d, MOD A4C: 193.0 ml LV vol s, MOD A2C: 170.0 ml LV vol s, MOD A4C: 156.0 ml LV SV MOD A2C:     37.0 ml LV SV MOD A4C:     193.0 ml LV SV MOD BP:      35.0 ml RIGHT VENTRICLE RV Basal diam:  3.30 cm RV Mid diam:    2.72 cm RV S prime:     12.00 cm/s TAPSE (M-mode): 1.3 cm LEFT ATRIUM             Index        RIGHT ATRIUM           Index LA diam:        3.84 cm 2.19 cm/m   RA Area:     15.10 cm LA Vol (A2C):   77.3 ml 44.17 ml/m  RA Volume:   39.40 ml  22.51 ml/m LA Vol (A4C):   75.5 ml 43.14 ml/m LA Biplane Vol: 76.0 ml 43.43 ml/m  AORTIC VALVE LVOT Vmax:   61.60 cm/s LVOT Vmean:  40.300 cm/s LVOT VTI:    0.082 m  SHUNTS Systemic VTI:  0.08 m Systemic Diam: 2.13 cm Aditya Sabharwal Electronically signed by Ria Commander Signature Date/Time: 04/29/2024/2:33:25 PM    Final    VAS US  LOWER EXTREMITY VENOUS (DVT) Result Date: 04/28/2024  Lower Venous DVT Study Patient Name:  Cody Glenn  Date of Exam:   04/28/2024 Medical Rec #: [id number]          Accession #:    [id number] Date of Birth: 12/15/1955  Patient Gender: M Patient Age:   29 years Exam Location:  [facility] Procedure:      VAS US  LOWER EXTREMITY VENOUS (DVT) Referring Phys:  DORN CHILL --------------------------------------------------------------------------------  Indications: Edema, and alcohol abuse, history of DVT, smoker.  Comparison Study: Previous study on 7.15.2025. Performing Technologist: Edilia Elden Appl  Examination Guidelines: A complete evaluation includes B-mode imaging, spectral Doppler, color Doppler, and power Doppler as needed of all accessible portions of each vessel. Bilateral testing is considered an integral part of a complete examination. Limited examinations for reoccurring indications may be performed as noted. The reflux portion of the exam is performed with the patient in reverse Trendelenburg.  +---------+---------------+---------+-----------+----------+--------------+ RIGHT    CompressibilityPhasicitySpontaneityPropertiesThrombus Aging +---------+---------------+---------+-----------+----------+--------------+ CFV      Partial        Yes      Yes                                 +---------+---------------+---------+-----------+----------+--------------+ SFJ      Full           Yes      Yes                                 +---------+---------------+---------+-----------+----------+--------------+ FV Prox  None           No       No                                  +---------+---------------+---------+-----------+----------+--------------+ FV Mid   None           No       No                                  +---------+---------------+---------+-----------+----------+--------------+ FV DistalNone           No       No                                  +---------+---------------+---------+-----------+----------+--------------+ PFV      Full                                                        +---------+---------------+---------+-----------+----------+--------------+ POP      Partial        Yes      Yes                                  +---------+---------------+---------+-----------+----------+--------------+ PTV      Partial        Yes      Yes                                 +---------+---------------+---------+-----------+----------+--------------+ PERO     Partial        Yes      Yes                                 +---------+---------------+---------+-----------+----------+--------------+   +---------+---------------+---------+-----------+----------+--------------+  LEFT     CompressibilityPhasicitySpontaneityPropertiesThrombus Aging +---------+---------------+---------+-----------+----------+--------------+ CFV      Partial        Yes      Yes                                 +---------+---------------+---------+-----------+----------+--------------+ SFJ      Partial        Yes      Yes                                 +---------+---------------+---------+-----------+----------+--------------+ FV Prox  None           No       No                                  +---------+---------------+---------+-----------+----------+--------------+ FV Mid   None           No       No                                  +---------+---------------+---------+-----------+----------+--------------+ FV DistalPartial        Yes      Yes                                 +---------+---------------+---------+-----------+----------+--------------+ PFV      None           No       No                                  +---------+---------------+---------+-----------+----------+--------------+ POP      Partial        Yes      Yes                                 +---------+---------------+---------+-----------+----------+--------------+ PTV      Partial        Yes      Yes                                 +---------+---------------+---------+-----------+----------+--------------+ PERO     Partial        Yes      Yes                                  +---------+---------------+---------+-----------+----------+--------------+     Summary: RIGHT: - Findings consistent with acute deep vein thrombosis involving the right femoral vein, right posterior tibial veins, and right peroneal veins.  - Findings consistent with chronic deep vein thrombosis involving the right common femoral vein, SF junction, and right popliteal vein.  - No cystic structure found in the popliteal fossa.  LEFT: - Findings consistent with age indeterminate deep vein thrombosis involving the left femoral vein, left proximal profunda vein, left posterior tibial veins, and left peroneal veins.  - Findings consistent with chronic deep vein thrombosis involving the left common femoral vein,  SF junction, and left popliteal vein.  - No cystic structure found in the popliteal fossa.  *See table(s) above for measurements and observations. Electronically signed by Penne Colorado MD on 04/28/2024 at 7:42:52 PM.    Final     Cardiac Studies   See echo above  Assessment   Active Problems:   Alcohol use   Tobacco use   Primary hypertension   Osteoarthritis   Peptic ulcer without hemorrhage, perforation, or obstruction   Prediabetes   Problem related to housing and economic circumstances, unspecified   Hyponatremia   Normocytic anemia   Mild protein malnutrition   Acute hypoxemic respiratory failure (HCC)   Emphysema/COPD   Hypocalcemia   Hypomagnesemia   Protein-calorie malnutrition, severe   QT prolongation   Plan   Continues with good urine output - high BNP. Replete potassium. Continue diuresis. Can titrate GDMT as BP allows - consider adding low dose losartan. Would avoid SGLT2 inhibitor given recent infection.  Time Spent Directly with Patient:  I have spent a total of 25 minutes with the patient reviewing hospital notes, telemetry, EKGs, labs and examining the patient as well as establishing an assessment and plan that was discussed personally with the patient.  > 50% of  time was spent in direct patient care.  Length of Stay:  LOS: 14 days   Cody KYM Maxcy, MD, [facility], FNLA, FACP  [place]  [facility]  Medical Director of the [facility]  [facility] Diplomate of the American Board of Clinical Lipidology Attending Cardiologist  Direct Dial: [phone]  Fax: [phone]  Website:  [email or web address]  Cody Glenn 04/30/2024, 9:12 AM

## 2024-04-30 NOTE — Progress Notes (Signed)
 Physical Therapy Treatment Patient Details Name: Cody Glenn MRN: [id number] DOB: [birth year] Today's Date: 04/30/2024   History of Present Illness Pt is 68 year old presented to [facility] on  04/15/24 for acute on chronic hyponatremia due to alcohol use. PMH - etoh abuse, DVT, htn, cervical stenosis, partial gastrectomy    PT Comments  Pt reports fatigue and pain since thoracentesis yesterday, limiting activity tolerance during treatment. Pt continues to require physical assistance for all functional mobility tasks due to generalized weakness and imbalance. Pt remains at a high risk for falls at this time. Patient will benefit from continued inpatient follow up therapy, <3 hours/day.    If plan is discharge home, recommend the following: A lot of help with walking and/or transfers;A lot of help with bathing/dressing/bathroom;Assistance with cooking/housework;Supervision due to cognitive status;Assist for transportation   Can travel by private vehicle     Yes  Equipment Recommendations       Recommendations for Other Services       Precautions / Restrictions Precautions Precautions: Fall Recall of Precautions/Restrictions: Impaired Precaution/Restrictions Comments: bowel incontinence, found on floor 04/25/2024, enteric Restrictions Weight Bearing Restrictions Per Provider Order: No     Mobility  Bed Mobility Overal bed mobility: Needs Assistance Bed Mobility: Rolling, Sidelying to Sit, Sit to Supine Rolling: Contact guard assist Sidelying to sit: Mod assist   Sit to supine: Mod assist        Transfers Overall transfer level: Needs assistance Equipment used: Rollator (4 wheels) Transfers: Sit to/from Stand Sit to Stand: Mod assist           General transfer comment: posterior lean, pt stands twice during session, declines attempts to step and pivot to recliner due to fatigue    Ambulation/Gait Ambulation/Gait assistance: Mod assist Gait Distance (Feet): 2  Feet Assistive device: Rollator (4 wheels) Gait Pattern/deviations: Shuffle Gait velocity: reduced Gait velocity interpretation: <1.31 ft/sec, indicative of household ambulator   General Gait Details: pt takes 4 shuffling side steps at edge of bed to left side   Stairs             Wheelchair Mobility     Tilt Bed    Modified Rankin (Stroke Patients Only)       Balance Overall balance assessment: Needs assistance Sitting-balance support: No upper extremity supported, Feet supported Sitting balance-Leahy Scale: Fair     Standing balance support: Bilateral upper extremity supported, Reliant on assistive device for balance Standing balance-Leahy Scale: Poor                              Communication Communication Communication: No apparent difficulties  Cognition Arousal: Alert Behavior During Therapy: WFL for tasks assessed/performed   PT - Cognitive impairments: Awareness, Memory, Problem solving, Safety/Judgement                         Following commands: Impaired Following commands impaired: Follows one step commands with increased time, Follows multi-step commands inconsistently    Cueing Cueing Techniques: Verbal cues, Tactile cues, Visual cues  Exercises      General Comments General comments (skin integrity, edema, etc.): VSS, pt on 4L [place]. pt incontinent of liquid stool upon PT's arrival. PT assists in hygiene tasks      Pertinent Vitals/Pain Pain Assessment Pain Assessment: Faces Faces Pain Scale: Hurts even more Pain Location: ribs Pain Descriptors / Indicators: Sore Pain Intervention(s): Monitored during session  Home Living Family/patient expects to be discharged to:: Shelter/Homeless Living Arrangements: Alone                      Prior Function            PT Goals (current goals can now be found in the care plan section) Acute Rehab PT Goals Patient Stated Goal: get something to eat PT Goal  Formulation: With patient Time For Goal Achievement: 05/14/24 Potential to Achieve Goals: Fair Progress towards PT goals: Not progressing toward goals - comment (limited by fatigue and pain)    Frequency    Min 2X/week      PT Plan      Co-evaluation              AM-PAC PT 6 Clicks Mobility   Outcome Measure  Help needed turning from your back to your side while in a flat bed without using bedrails?: A Little Help needed moving from lying on your back to sitting on the side of a flat bed without using bedrails?: A Lot Help needed moving to and from a bed to a chair (including a wheelchair)?: A Lot Help needed standing up from a chair using your arms (e.g., wheelchair or bedside chair)?: A Lot Help needed to walk in hospital room?: Total Help needed climbing 3-5 steps with a railing? : Total 6 Click Score: 11    End of Session Equipment Utilized During Treatment: Gait belt;Oxygen Activity Tolerance: Patient limited by fatigue;Patient limited by pain Patient left: in bed;with call bell/phone within reach;with bed alarm set Nurse Communication: Mobility status PT Visit Diagnosis: Unsteadiness on feet (R26.81);Other abnormalities of gait and mobility (R26.89);Muscle weakness (generalized) (M62.81);Difficulty in walking, not elsewhere classified (R26.2)     Time: 8874-8844 PT Time Calculation (min) (ACUTE ONLY): 30 min  Charges:    $Therapeutic Activity: 23-37 mins PT General Charges $$ ACUTE PT VISIT: 1 Visit                     Bernardino JINNY Ruth, PT, DPT Acute Rehabilitation Office [phone]    Bernardino JINNY Ruth 04/30/2024, 12:51 PM

## 2024-05-01 DIAGNOSIS — Z515 Encounter for palliative care: Secondary | ICD-10-CM

## 2024-05-01 DIAGNOSIS — Z66 Do not resuscitate: Secondary | ICD-10-CM

## 2024-05-01 DIAGNOSIS — I5043 Acute on chronic combined systolic (congestive) and diastolic (congestive) heart failure: Secondary | ICD-10-CM | POA: Diagnosis not present

## 2024-05-01 DIAGNOSIS — I5021 Acute systolic (congestive) heart failure: Secondary | ICD-10-CM

## 2024-05-01 DIAGNOSIS — Z7189 Other specified counseling: Secondary | ICD-10-CM

## 2024-05-01 DIAGNOSIS — F1010 Alcohol abuse, uncomplicated: Secondary | ICD-10-CM

## 2024-05-01 LAB — MAGNESIUM: Magnesium: 2.1 mg/dL (ref 1.7–2.4)

## 2024-05-01 LAB — CBC
HCT: 34.8 % — ABNORMAL LOW (ref 39.0–52.0)
Hemoglobin: 11.2 g/dL — ABNORMAL LOW (ref 13.0–17.0)
MCH: 32.8 pg (ref 26.0–34.0)
MCHC: 32.2 g/dL (ref 30.0–36.0)
MCV: 102.1 fL — ABNORMAL HIGH (ref 80.0–100.0)
Platelets: 331 K/uL (ref 150–400)
RBC: 3.41 MIL/uL — ABNORMAL LOW (ref 4.22–5.81)
RDW: 15.3 % (ref 11.5–15.5)
WBC: 9.6 K/uL (ref 4.0–10.5)
nRBC: 0 % (ref 0.0–0.2)

## 2024-05-01 LAB — GLUCOSE, CAPILLARY
Glucose-Capillary: 124 mg/dL — ABNORMAL HIGH (ref 70–99)
Glucose-Capillary: 114 mg/dL — ABNORMAL HIGH (ref 70–99)
Glucose-Capillary: 86 mg/dL (ref 70–99)
Glucose-Capillary: 105 mg/dL — ABNORMAL HIGH (ref 70–99)

## 2024-05-01 LAB — PHOSPHORUS: Phosphorus: 3.3 mg/dL (ref 2.5–4.6)

## 2024-05-01 LAB — BASIC METABOLIC PANEL WITH GFR
Anion gap: 12 (ref 5–15)
BUN: 11 mg/dL (ref 8–23)
CO2: 24 mmol/L (ref 22–32)
Calcium: 8.4 mg/dL — ABNORMAL LOW (ref 8.9–10.3)
Chloride: 103 mmol/L (ref 98–111)
Creatinine, Ser: 0.7 mg/dL (ref 0.61–1.24)
GFR, Estimated: >60 mL/min (ref >60)
Glucose, Bld: 91 mg/dL (ref 70–99)
Potassium: 3.8 mmol/L (ref 3.5–5.1)
Sodium: 139 mmol/L (ref 135–145)

## 2024-05-01 LAB — BRAIN NATRIURETIC PEPTIDE: B Natriuretic Peptide: 3602 pg/mL — ABNORMAL HIGH (ref 0.0–100.0)

## 2024-05-01 LAB — CYTOLOGY - NON PAP

## 2024-05-01 MED ORDER — APIXABAN 5 MG PO TABS
10.0000 mg | ORAL_TABLET | Freq: Two times a day (BID) | ORAL | Status: DC
  Administered 2024-05-01 – 2024-05-02 (×3): 10 mg via ORAL
  Filled 2024-05-01 (×3): qty 2

## 2024-05-01 MED ORDER — APIXABAN 5 MG PO TABS: 5.0000 mg | ORAL_TABLET | Freq: Two times a day (BID) | ORAL | Status: DC

## 2024-05-01 NOTE — TOC Progression Note (Signed)
 Transition of Care [facility]) - Progression Note    Patient Details  Name: Cody Glenn MRN: [id number] Date of Birth: [birth year]  [facility] CM/SW Contact  Inocente GORMAN Kindle, LCSW Phone Number: 05/01/2024, 10:49 AM  Clinical Narrative:    CSW continuing to follow for medical stability.   Expected Discharge Plan: Skilled Nursing Facility Barriers to Discharge: Continued Medical Work up               Expected Discharge Plan and Services In-house Referral: Clinical Social Work   Post Acute Care Choice: Skilled Nursing Facility Living arrangements for the past 2 months: Homeless                                       Social Drivers of Health (SDOH) Interventions SDOH Screenings   Food Insecurity: Food Insecurity Present (04/17/2024)  Housing: High Risk (04/30/2024)  Transportation Needs: Unmet Transportation Needs (04/30/2024)  Utilities: Not At Risk (04/17/2024)  Alcohol Screen: High Risk (04/30/2024)  Depression (PHQ2-9): Low Risk  (10/13/2020)  Financial Resource Strain: Low Risk  (04/30/2024)  Social Connections: Moderately Isolated (04/17/2024)  Tobacco Use: High Risk (04/15/2024)    Readmission Risk Interventions    02/22/2024    2:07 PM  Readmission Risk Prevention Plan  Post Dischage Appt Complete  Medication Screening Complete  Transportation Screening Complete

## 2024-05-01 NOTE — Progress Notes (Addendum)
 PROGRESS NOTE        PATIENT DETAILS Name: Cody Glenn Age: 68 y.o. Sex: male Date of Birth: May 27, 1956 Admit Date: 04/15/2024 Admitting Physician Aimee Somerset, MD ERE:Tmphyu, Belvie BRAVO, MD  Brief Summary: Patient is a 68 y.o.  male with history of EtOH use, known lung cavitary lesion, HTN, homelessness-who was found by a bystander in a parking lot-intoxicated-he was found to have hypoglycemia-CXR with cavitary lesion left upper lobe-PCCM consulted-underwent bronchoscopy-BAL cultures positive for Acinetobacter-further hospital course complicated by worsening respiratory failure-found to have new diagnosis of acute systolic heart failure.  Significant events: 9/9>> admit to [facility] 9/12>> bronchoscopy 9/20>> worsening respiratory distress-found to have new HFrEF. 9/22>> thoracocentesis by PCCM  Significant studies: 9/10>> CXR: Left upper lobe persistent cavitary process. 9/10>> CT chest:: Increasing thick-walled cavitary lesion in the left upper lobe-consistent with progressive cavitary pneumonia.  Will ground glass opacities in the lingula/left lower lobe. 9/19>> CXR: Worsening bilateral airspace opacities-likely edema. 9/21>> CTA chest: No PE, increased consolidative lung disease in the perihilar/suprahilar left lung-now incorporating the cavitary lesion seen previously. 9/21>> B/L LE Doppler: Acute DVT involving the right femoral/right posterior tibial/right peroneal vein, finding consistent with age-indeterminate DVT on the left. 9/22>> echo: EF 10%  Significant microbiology data: 9/10>> COVID PCR: Negative 9/10>> rest virus panel: Negative 9/10>> blood culture: No growth 9/11>> stool GI pathogen panel: Enteroinvasive Shigella/E. coli. 9/12>> culture BAL: Acinetobacter 9/22>> pleural fluid culture: No growth  Procedures: See above.  Consults: PCCM Cardiology  Subjective: Lying comfortably in bed-denies any chest pain or shortness of  breath.  Objective: Vitals: Blood pressure 112/70, pulse 75, temperature (!) 97.4 F (36.3 C), resp. rate 17, height 5' 5 (1.651 m), weight 54.2 kg, SpO2 100%.   Exam: Gen Exam:Alert awake-not in any distress HEENT:atraumatic, normocephalic Chest: B/L clear to auscultation anteriorly CVS:S1S2 regular Abdomen:soft non tender, non distended Extremities:no edema Neurology: Non focal Skin: no rash  Pertinent Labs/Radiology:    Latest Ref Rng & Units 05/01/2024    3:21 AM 04/30/2024    2:26 AM 04/29/2024    3:11 AM  CBC  WBC 4.0 - 10.5 K/uL 9.6  10.2  6.0   Hemoglobin 13.0 - 17.0 g/dL 88.7  9.3  8.8   Hematocrit 39.0 - 52.0 % 34.8  29.1  27.3   Platelets 150 - 400 K/uL 331  286  327     Lab Results  Component Value Date   NA 139 05/01/2024   K 3.8 05/01/2024   CL 103 05/01/2024   CO2 24 05/01/2024      Assessment/Plan: Sepsis secondary to Acinetobacter pneumonia and infectious gastroenteritis. Sepsis physiology has resolved. Continue antibiotics  Persistent left upper lobe cavitary lesion Acinetobacter pneumonia PCCM following S/p bronchoscopy Continue meropenem   Shigella gastroenteritis Diarrhea is resolved Completed a course of Zithromax .  Acute hypoxic respiratory failure  secondary to acute HFrEF Hypoxia has improved with diuretics Currently on room air  Newly diagnosed acute HFrEF Suspicion for EtOH cardiomyopathy Reviewed cardiology consult note-not a candidate for advanced therapies Continue diuretics-cardiology will consider initiation of GDMT when a bit more euvolemic.  QTc prolongation Telemetry monitoring Avoid QTc prolonging agents Keep K> 4, Mg>2 to  B/L lower extremity DVT Starting Eliquis  per pharmacy.  Probable cirrhosis Nodular liver seen on imaging studies Likely secondary to EtOH Supportive care  History of peptic ulcer disease Protonix   HTN  BP stable Currently not on any antihypertensives Cardiology planning to titrate  GDMT medications once more euvolemic.  COPD Stable-not in exacerbation As needed bronchodilators  Chronic back/neck pain: Appears stable Transdermal Lidoderm .  Tobacco abuse Transdermal nicotine   EtOH use Treated with Ativan  per CIWA protocol during the earlier part of this hospitalization Now awake/alert and out of window for any further withdrawal symptoms.  Homelessness TOC eval prior to discharge.  Nutrition Status: Nutrition Problem: Severe Malnutrition Etiology: social / environmental circumstances, chronic illness (Chronic alcohol abuse and homelessness) Signs/Symptoms: severe muscle depletion, severe fat depletion Interventions: Refer to RD note for recommendations  Code status:   Code Status: Limited: Do not attempt resuscitation (DNR) -DNR-LIMITED -Do Not Intubate/DNI    DVT Prophylaxis: heparin  injection 5,000 Units Start: 04/30/24 0600 Place and maintain sequential compression device Start: 04/16/24 1157    Family Communication: None at bedside   Disposition Plan: Status is: Inpatient Remains inpatient appropriate because: Severity of illness   Planned Discharge Destination:Skilled nursing facility   Diet: Diet Order             Diet regular Room service appropriate? Yes with Assist; Fluid consistency: Thin  Diet effective now                     Antimicrobial agents: Anti-infectives (From admission, onward)    Start     Dose/Rate Route Frequency Ordered Stop   04/27/24 1800  meropenem  (MERREM ) 1 g in sodium chloride  0.9 % 100 mL IVPB        1 g 200 mL/hr over 30 Minutes Intravenous Every 8 hours 04/27/24 1526 05/01/24 2359   04/22/24 1045  linezolid  (ZYVOX ) tablet 600 mg  Status:  Discontinued        600 mg Oral Every 12 hours 04/22/24 0948 04/25/24 0947   04/18/24 1100  azithromycin  (ZITHROMAX ) tablet 500 mg        500 mg Oral Daily 04/18/24 1002 04/24/24 0921   04/17/24 1200  Ampicillin -Sulbactam (UNASYN ) 3 g in sodium chloride  0.9 %  100 mL IVPB  Status:  Discontinued        3 g 200 mL/hr over 30 Minutes Intravenous Every 6 hours 04/17/24 1025 04/27/24 1526        MEDICATIONS: Scheduled Meds:  vitamin B-12  1,000 mcg Oral Daily   feeding supplement  1 Container Oral TID BM   folic acid   1 mg Oral Daily   furosemide   80 mg Intravenous BID   heparin  injection (subcutaneous)  5,000 Units Subcutaneous Q8H   lidocaine   1 patch Transdermal Q24H   multivitamin with minerals  1 tablet Oral Daily   nicotine   14 mg Transdermal Daily   pantoprazole   40 mg Oral Daily   potassium chloride   20 mEq Oral BID   thiamine   100 mg Oral Daily   Continuous Infusions:  meropenem  (MERREM ) IV 1 g (05/01/24 0548)   PRN Meds:.acetaminophen  **OR** acetaminophen , dextrose , levalbuterol , melatonin, nicotine  polacrilex, polyethylene glycol   I have personally reviewed following labs and imaging studies  LABORATORY DATA: CBC: Recent Labs  Lab 04/27/24 1039 04/28/24 0240 04/29/24 0311 04/30/24 0226 05/01/24 0321  WBC 5.8 3.3* 6.0 10.2 9.6  HGB 9.4* 8.9* 8.8* 9.3* 11.2*  HCT 29.9* 27.2* 27.3* 29.1* 34.8*  MCV 104.9* 101.1* 103.4* 103.6* 102.1*  PLT 367 304 327 286 331    Basic Metabolic Panel: Recent Labs  Lab 04/27/24 1039 04/28/24 0240 04/29/24 0311 04/30/24 0226 05/01/24 0321  NA 142  143 144 142 139  K 4.2 4.2 3.7 3.3* 3.8  CL 110 112* 108 106 103  CO2 18* 20* 22 23 24   GLUCOSE 115* 145* 117* 85 91  BUN 11 13 17 14 11   CREATININE 0.74 0.82 0.78 0.81 0.70  CALCIUM  8.6* 8.7* 8.6* 8.3* 8.4*  MG 2.0 2.0 2.0 1.8 2.1  PHOS 4.4 4.5 5.2* 3.7 3.3    GFR: Estimated Creatinine Clearance: 68.7 mL/min (by C-G formula based on SCr of 0.7 mg/dL).  Liver Function Tests: Recent Labs  Lab 04/29/24 1913  PROT 6.7   No results for input(s): LIPASE, AMYLASE in the last 168 hours. No results for input(s): AMMONIA in the last 168 hours.  Coagulation Profile: No results for input(s): INR, PROTIME in the last 168  hours.  Cardiac Enzymes: No results for input(s): CKTOTAL, CKMB, CKMBINDEX, TROPONINI in the last 168 hours.  BNP (last 3 results) No results for input(s): PROBNP in the last 8760 hours.  Lipid Profile: No results for input(s): CHOL, HDL, LDLCALC, TRIG, CHOLHDL, LDLDIRECT in the last 72 hours.  Thyroid  Function Tests: No results for input(s): TSH, T4TOTAL, FREET4, T3FREE, THYROIDAB in the last 72 hours.  Anemia Panel: No results for input(s): VITAMINB12, FOLATE, FERRITIN, TIBC, IRON, RETICCTPCT in the last 72 hours.  Urine analysis:    Component Value Date/Time   COLORURINE AMBER (A) 06/29/2020 1933   APPEARANCEUR CLEAR 06/29/2020 1933   LABSPEC 1.020 06/29/2020 1933   PHURINE 6.0 06/29/2020 1933   GLUCOSEU NEGATIVE 06/29/2020 1933   HGBUR NEGATIVE 06/29/2020 1933   BILIRUBINUR NEGATIVE 06/29/2020 1933   KETONESUR 20 (A) 06/29/2020 1933   PROTEINUR 100 (A) 06/29/2020 1933   NITRITE NEGATIVE 06/29/2020 1933   LEUKOCYTESUR NEGATIVE 06/29/2020 1933    Sepsis Labs: Lactic Acid, Venous    Component Value Date/Time   LATICACIDVEN 2.7 (HH) 04/27/2024 2233    MICROBIOLOGY: Recent Results (from the past 240 hours)  Body fluid culture w Gram Stain     Status: None (Preliminary result)   Collection Time: 04/29/24  3:49 PM   Specimen: Pleural Fluid  Result Value Ref Range Status   Specimen Description FLUID PLEURAL  Final   Special Requests NONE  Final   Gram Stain NO WBC SEEN NO ORGANISMS SEEN CYTOSPIN SMEAR   Final   Culture   Final    NO GROWTH 2 DAYS Performed at [facility], [address], [place], [place] [address]    Report Status PENDING  Incomplete    RADIOLOGY STUDIES/RESULTS: DG CHEST PORT 1 VIEW Result Date: 04/29/2024 CLINICAL DATA:  Status post thoracentesis EXAM: PORTABLE CHEST 1 VIEW COMPARISON:  Chest x-ray 04/26/2024 FINDINGS: There small bilateral pleural effusions similar to the prior study. There  is no pneumothorax. Heart is enlarged, unchanged. There central pulmonary vascular congestion. Patchy left upper lobe airspace opacities persist. The osseous structures are stable. IMPRESSION: 1. Small bilateral pleural effusions similar to the prior study. No pneumothorax. 2. Cardiomegaly with central pulmonary vascular congestion. 3. Patchy left upper lobe airspace opacities persist. Electronically Signed   By: Greig Pique M.D.   On: 04/29/2024 17:06     LOS: 15 days   Donalda Applebaum, MD  [facility]    To contact the attending provider between 7A-7P or the covering provider during after hours 7P-7A, please log into the web site [email or web address] and access using universal Jud password for that web site. If you do not have the password, please call the hospital operator.  05/01/2024, 11:18 AM

## 2024-05-01 NOTE — Plan of Care (Signed)
  Problem: Clinical Measurements: Goal: Diagnostic test results will improve Outcome: Progressing   Problem: Clinical Measurements: Goal: Respiratory complications will improve Outcome: Progressing   

## 2024-05-01 NOTE — Consult Note (Signed)
 Palliative Care Consult Note                                  Date: 05/01/2024   Patient Name: Cody Glenn  DOB:1955-10-05  FMW:[id number]  Age / Sex:68 y.o., male  PCP: [name], MD Referring Physician: Raenelle Donalda HERO, MD  Reason for Consultation: Establishing goals of care  Past Medical History:  Diagnosis Date   Alcohol use    Anemia    Cavitary lung disease    Cirrhosis (HCC)    Homelessness    Hyperbilirubinemia 07/01/2020   Peptic ulcer 08/09/1987   Jul 28, 2008 Entered By: KATHERINE ETHA HERO Comment: s/p partial gastrectomy   Thoracic ascending aortic aneurysm    Thrombocytopenia    Tobacco abuse      Assessment & Plan:   HPI/Patient Profile: 68 y.o. male  with past medical history of EToH use disorder, active smoking, HTN admitted on 04/15/2024 after being found unconscious by a bystander and admitted with current hospitalization complicated by multifocal pneumonia and acute systolic CHF. Patient recently seen in the ED on 8/11 for alcohol intoxication. Echo completed on 9/22 showed EF of 10% from 60% in July, multifactorial cause for acute failure including poor caloric intake or alcohol use. Patient not a candidate for advanced therapies per Cardiology. Palliative consulted for goals of care conversation in light of new severe HFrEF.   - Patient understands that his heart failure is severe and that his options are limited, but wants to continue with the current medical management, knows that his time left to live is also limited and when the time comes he is okay with a natural passing  SUMMARY OF RECOMMENDATIONS   DNR - limited Continue current management, treat what is treatable   Code Status: DNR - Limited (DNR/DNI)  Prognosis:  Unable to determine  Discharge Planning:  To Be Determined   Discussed with: Raenelle MD, Drew RN, Kennyth NP, Curtistine (brother by phone), patient  Subjective:    Reviewed medical records, received report from team, assessed the patient and then meet at the patient's bedside to discuss diagnosis, prognosis, GOC, EOL wishes disposition and options.  Before meeting with the patient/family, I spent time reviewing the chart notes including cardiology consult and progress notes. I also reviewed vital signs, nursing flowsheets, medication administrations record, labs, and imaging. Labs/imaging reviewed include echocardiogram.  I met with: Patient Spoke with Curtistine (brother) on the phone    We meet to discuss diagnosis prognosis, GOC, EOL wishes, disposition and options. Concept of Palliative Care was introduced as specialized medical care for people and their families living with serious illness.  If focuses on providing relief from the symptoms and stress of a serious illness.  The goal is to improve quality of life for both the patient and the family. Values and goals of care important to patient and family were attempted to be elicited.  Created space and opportunity for patient  and family to explore thoughts and feelings regarding current medical situation   Natural trajectory and current clinical status were discussed. Questions and concerns addressed. Patient encouraged to call with questions or concerns.    Patient/Family Understanding of Illness: - Patient and family understands the severity of his heart failure, knows that his echo showed a steep decline in EF from July to September from 60% to 10% - He understands that alcohol intake may have played a major  part in his decline and expressed that he will stop drinking and make an effort to take his prescribed medications and attend medical appointments  Life Review: - Per chart review patient decreased consumption of alcohol from 12-24 beers daily to 1/5 of liquor every 5 days - During today's meeting patient shared that he plans on ceasing alcohol consumption - Plans on staying with his friend  Kirt) after discharge who will be helping him attend appointments  Baseline Status: - Per chart review patient is unhoused and plans on staying at a friend's house after discharge - After speaking on the phone with Curtistine (brother), it seems staying with his brother may be an option, but he will speak to the patient about it  Today's Discussion: - Educated patient and family on the patient's severe heart failure and that Cardiology does not think advanced therapies would be an option for him - Current management is medical therapy and would not qualify for more invasive therapies, patient understands he is not a candidate and is okay with the current medical management - Patient understands and is accepting of the fact that the current medical management may not provide relief for very long and that his time may be short and he is at peace with that, in the meantime he will do his best to take the prescribed medications and attend appointments when he is discharged - With the patient's permission his brother Rheba) was updated on conversation and goals  Goals: - To not be a burden on his family  Review of Systems  All other systems reviewed and are negative.   Objective:   Primary Diagnoses: Present on Admission:  Hyponatremia  Alcohol use  Emphysema/COPD  Mild protein malnutrition  Normocytic anemia  Osteoarthritis  Peptic ulcer without hemorrhage, perforation, or obstruction  Prediabetes  Primary hypertension  Tobacco use  Acute hypoxemic respiratory failure (HCC)   Vital Signs:  BP 112/70 (BP Location: Right Leg)   Pulse 75   Temp 98 F (36.7 C) (Oral)   Resp 17   Ht 5' 5 (1.651 m)   Wt 54.2 kg   SpO2 100%   BMI 19.88 kg/m   Physical Exam HENT:     Head: Normocephalic.     Nose: Nose normal.  Eyes:     Extraocular Movements: Extraocular movements intact.  Pulmonary:     Effort: Pulmonary effort is normal.  Neurological:     General: No focal  deficit present.     Mental Status: He is alert and oriented to person, place, and time.  Psychiatric:        Mood and Affect: Mood normal.     Palliative Assessment/Data: 70%    Thank you for allowing us  to participate in the care of JD MCCASTER PMT will continue to support holistically.  Billing based on MDM: High  Problems Addressed: One or more chronic illnesses with severe exacerbation, progression, or side effects of treatment.  Amount and/or Complexity of Data: Category 1:Review of prior external note(s) from each unique source and Assessment requiring an independent historian(s) and Category 3:Discussion of management or test interpretation with external physician/other qualified health care professional/appropriate source (not separately reported)  Risks: Decision not to resuscitate or to de-escalate care because of poor prognosis  Detailed review of medical records (labs, imaging, vital signs), medically appropriate exam, discussed with treatment team, counseling and education to patient, family, & staff, documenting clinical information, medication management, coordination of care.  Signed by: Fairy FORBES Kemps,  PA-C Palliative Medicine Team  Team Phone # [phone] (Nights/Weekends)  05/01/2024, 8:40 AM

## 2024-05-01 NOTE — Progress Notes (Signed)
  Progress Note  Patient Name: Cody Glenn Date of Encounter: 05/01/2024 [facility] Cardiologist: None   Interval Summary   No issues overnight- met with Palliative care this morning. Minimal additional urine output overnight. BNP lower at 3602.   Vital Signs Vitals:   05/01/24 0200 05/01/24 0400 05/01/24 0431 05/01/24 0700  BP:  112/70    Pulse: 78 75    Resp: (!) 24 17    Temp:  98 F (36.7 C)  (!) 97.4 F (36.3 C)  TempSrc:  Oral    SpO2: 100% 100%    Weight:   54.2 kg   Height:        Intake/Output Summary (Last 24 hours) at 05/01/2024 0929 Last data filed at 05/01/2024 0600 Gross per 24 hour  Intake 583.82 ml  Output 700 ml  Net -116.18 ml      05/01/2024    4:31 AM 04/30/2024    5:00 AM 04/19/2024   11:54 AM  Last 3 Weights  Weight (lbs) 119 lb 7.8 oz 125 lb 7.1 oz 149 lb 14.6 oz  Weight (kg) 54.2 kg 56.9 kg 68 kg     Telemetry/ECG  Sinus rhythm, HR 70s, short run of NSVT- Personally Reviewed  Physical Exam  GEN: No acute distress, chronically ill-appearing.   Neck: JVP elevated 3-4 cmH20 Cardiac: RRR, no murmurs, rubs, or gallops.  Respiratory: diminished breath sounds. GI: Soft, nontender, non-distended  MS: No edema  Assessment & Plan  68 y.o. male with a hx of alcoholism, tobacco abuse, cavitary lung lesion, DVT, PUD, pre-DM, HTN, spinal stenosis, cirrhosis, ascending aortic aneurysm (4.5cm by CT this admission), thrombocytopenia, chronic appearing anemia, homelessness who is being seen for CHF  Acute HFrEF Hypertension  LVEF 10% this admission, down from 55-60% 02/2024 BNP 3-4k this admission  Creatinine stable Mild hypokalemia  BP stable, most recent 112/70 Net -2.9 L with only 700 cc recorded yesterday Continue strict I&O's, daily weights, daily BMPs Currently on IV Lasix  80 mg BID with K 20 mEq BID, may be able to transition to oral lasix  tomorrow -  BNP remains elevated, however, may not be accurately representative of volume  status. Patient being seen by palliative medicine this AM, will follow their recommendations in regards to treatment/patient wishes, now DNR-limited code Consider titration of GDMT once adequately diuresed  QTc prolongation  Measured at 518 ms on most recent EKG Avoid QT prolonging medications  Keep Mag > 2, K > 4  Per primary Sepsis Multifocal PNA Persistent lung cavitary lesions  Shigella gastroenteritis [facility] delirium Tobacco use disorder Alcohol abuse  COPD PUD Chronic anemia  Chronic pain  Thyroid  lab abnormalities   Diabetes  Electrolyte disturbances    For questions or updates, please contact [facility] Please consult [email or web address] for contact info under          Vinie KYM Maxcy, MD, [facility], FNLA, FACP  [place]  [facility]  Medical Director of the Advanced Lipid Disorders &  Cardiovascular Risk Reduction Clinic Diplomate of the American Board of Clinical Lipidology Attending Cardiologist  Direct Dial: [phone]  Fax: [phone]  Website:  [email or web address]

## 2024-05-01 NOTE — Progress Notes (Signed)
 Occupational Therapy Treatment Patient Details Name: KNOWLEDGE ESCANDON MRN: [id number] DOB: 04-18-1956 Today's Date: 05/01/2024   History of present illness Pt is 68 year old presented to [facility] on  04/15/24 for acute on chronic hyponatremia due to alcohol use. PMH - etoh abuse, DVT, htn, cervical stenosis, partial gastrectomy   OT comments  Pt seen today to address ADLs--pt in the middle of eating lunch so was not as agreeable to doing a lot with me. He is able to do all of his meal set up while seated in recliner; he cannot do all of his LB ADLs while seated (cannot reach all the way to his toes. He is able to maintain his sitting balance in recliner even when going out of his base of support. He will continue to benefit from acute OT with follow up from continued inpatient follow up therapy, <3 hours/day.       If plan is discharge home, recommend the following:  A lot of help with walking and/or transfers;A lot of help with bathing/dressing/bathroom;Assistance with cooking/housework;Direct supervision/assist for medications management;Direct supervision/assist for financial management;Assist for transportation;Help with stairs or ramp for entrance   Equipment Recommendations  Other (comment) (TBD next venue)       Precautions / Restrictions Precautions Precautions: Fall Recall of Precautions/Restrictions: Impaired Restrictions Weight Bearing Restrictions Per Provider Order: No       Mobility Bed Mobility  Pt up in recliner                    Balance       Sitting balance - Comments: pt can come out of his base of support while seated in recliner and not lose balance                                   ADL either performed or assessed with clinical judgement   ADL Overall ADL's : Needs assistance/impaired Eating/Feeding: Independent;Sitting Eating/Feeding Details (indicate cue type and reason): Pt able to manage all of his items on his food tray without  assistance                   Lower Body Dressing Details (indicate cue type and reason): Pt cannot get his legs crossed in a figure 4 fashion so he needs A to start socks and then can get them on the rest of the way while seated in recliner; he can mange to get them off by himself                    Extremity/Trunk Assessment Upper Extremity Assessment Upper Extremity Assessment: Generalized weakness (patient using both arms functionally)            Vision Patient Visual Report: No change from baseline                       Frequency  Min 2X/week        Progress Toward Goals  OT Goals(current goals can now be found in the care plan section)  Progress towards OT goals: Progressing toward goals  Acute Rehab OT Goals Patient Stated Goal: to go to rehab OT Goal Formulation: With patient Time For Goal Achievement: 05/02/24 Potential to Achieve Goals: Good  Plan         AM-PAC OT 6 Clicks Daily Activity     Outcome Measure   Help from another person  eating meals?: None Help from another person taking care of personal grooming?: A Little Help from another person toileting, which includes using toliet, bedpan, or urinal?: Total Help from another person bathing (including washing, rinsing, drying)?: A Lot Help from another person to put on and taking off regular upper body clothing?: A Little Help from another person to put on and taking off regular lower body clothing?: A Lot 6 Click Score: 15    End of Session    OT Visit Diagnosis: Other abnormalities of gait and mobility (R26.89);Muscle weakness (generalized) (M62.81)   Activity Tolerance  Good   Patient Left  Left in recliner as he was found when I entered   Nurse Communication  Called front desk to make them aware pt wanted linens on his bed and to get back in bed.        Time: 8490-8477 OT Time Calculation (min): 13 min  Charges: OT General Charges $OT Visit: 1 Visit OT  Treatments $Self Care/Home Management : 8-22 mins  Donny BECKER OT Acute Rehabilitation Services Office [phone]    Rodgers Dorothyann Distel 05/01/2024, 4:02 PM

## 2024-05-02 ENCOUNTER — Telehealth (HOSPITAL_COMMUNITY): Payer: Self-pay | Admitting: Pharmacy Technician

## 2024-05-02 ENCOUNTER — Other Ambulatory Visit (HOSPITAL_COMMUNITY): Payer: Self-pay

## 2024-05-02 DIAGNOSIS — J9601 Acute respiratory failure with hypoxia: Secondary | ICD-10-CM | POA: Diagnosis not present

## 2024-05-02 DIAGNOSIS — E43 Unspecified severe protein-calorie malnutrition: Secondary | ICD-10-CM

## 2024-05-02 DIAGNOSIS — E871 Hypo-osmolality and hyponatremia: Secondary | ICD-10-CM | POA: Diagnosis not present

## 2024-05-02 DIAGNOSIS — F1010 Alcohol abuse, uncomplicated: Secondary | ICD-10-CM | POA: Diagnosis not present

## 2024-05-02 DIAGNOSIS — I5021 Acute systolic (congestive) heart failure: Secondary | ICD-10-CM | POA: Diagnosis not present

## 2024-05-02 LAB — BASIC METABOLIC PANEL WITH GFR
Anion gap: 10 (ref 5–15)
BUN: 10 mg/dL (ref 8–23)
CO2: 26 mmol/L (ref 22–32)
Calcium: 8.5 mg/dL — ABNORMAL LOW (ref 8.9–10.3)
Chloride: 98 mmol/L (ref 98–111)
Creatinine, Ser: 0.56 mg/dL — ABNORMAL LOW (ref 0.61–1.24)
GFR, Estimated: >60 mL/min (ref >60)
Glucose, Bld: 92 mg/dL (ref 70–99)
Potassium: 4.1 mmol/L (ref 3.5–5.1)
Sodium: 134 mmol/L — ABNORMAL LOW (ref 135–145)

## 2024-05-02 LAB — BRAIN NATRIURETIC PEPTIDE: B Natriuretic Peptide: 2842.5 pg/mL — ABNORMAL HIGH (ref 0.0–100.0)

## 2024-05-02 LAB — GLUCOSE, CAPILLARY
Glucose-Capillary: 81 mg/dL (ref 70–99)
Glucose-Capillary: 93 mg/dL (ref 70–99)

## 2024-05-02 LAB — MAGNESIUM: Magnesium: 2 mg/dL (ref 1.7–2.4)

## 2024-05-02 LAB — PHOSPHORUS: Phosphorus: 3.8 mg/dL (ref 2.5–4.6)

## 2024-05-02 MED ORDER — NICOTINE 14 MG/24HR TD PT24: 14.0000 mg | MEDICATED_PATCH | Freq: Every day | TRANSDERMAL | Status: DC

## 2024-05-02 MED ORDER — LEVALBUTEROL HCL 1.25 MG/0.5ML IN NEBU: 1.2500 mg | INHALATION_SOLUTION | Freq: Four times a day (QID) | RESPIRATORY_TRACT | Status: DC | PRN

## 2024-05-02 MED ORDER — FUROSEMIDE 40 MG PO TABS: 80.0000 mg | ORAL_TABLET | Freq: Every day | ORAL | Status: DC

## 2024-05-02 MED ORDER — PANTOPRAZOLE SODIUM 40 MG PO TBEC: 40.0000 mg | DELAYED_RELEASE_TABLET | Freq: Every day | ORAL | Status: DC

## 2024-05-02 MED ORDER — ADULT MULTIVITAMIN W/MINERALS CH: 1.0000 | ORAL_TABLET | Freq: Every day | ORAL | Status: DC

## 2024-05-02 MED ORDER — POLYETHYLENE GLYCOL 3350 17 G PO PACK: 17.0000 g | PACK | Freq: Every day | ORAL | Status: DC | PRN

## 2024-05-02 MED ORDER — CYANOCOBALAMIN 1000 MCG PO TABS: 1000.0000 ug | ORAL_TABLET | Freq: Every day | ORAL | Status: DC

## 2024-05-02 MED ORDER — POTASSIUM CHLORIDE CRYS ER 20 MEQ PO TBCR: 20.0000 meq | EXTENDED_RELEASE_TABLET | Freq: Every day | ORAL | Status: DC

## 2024-05-02 MED ORDER — FUROSEMIDE 40 MG PO TABS
80.0000 mg | ORAL_TABLET | Freq: Two times a day (BID) | ORAL | Status: DC
  Administered 2024-05-02: 80 mg via ORAL
  Filled 2024-05-02: qty 2

## 2024-05-02 MED ORDER — LIDOCAINE 5 % EX PTCH: 1.0000 | MEDICATED_PATCH | CUTANEOUS | Status: DC

## 2024-05-02 MED ORDER — POTASSIUM CHLORIDE CRYS ER 20 MEQ PO TBCR
20.0000 meq | EXTENDED_RELEASE_TABLET | Freq: Every day | ORAL | Status: DC
  Administered 2024-05-02: 20 meq via ORAL
  Filled 2024-05-02: qty 1

## 2024-05-02 MED ORDER — APIXABAN 5 MG PO TABS: ORAL_TABLET | ORAL | Status: DC

## 2024-05-02 MED ORDER — FUROSEMIDE 80 MG PO TABS: 80.0000 mg | ORAL_TABLET | Freq: Every day | ORAL | Status: DC

## 2024-05-02 MED ORDER — LOPERAMIDE HCL 2 MG PO CAPS
2.0000 mg | ORAL_CAPSULE | Freq: Once | ORAL | Status: AC
Start: 2024-05-02 — End: 2024-05-02
  Administered 2024-05-02: 2 mg via ORAL
  Filled 2024-05-02: qty 1

## 2024-05-02 NOTE — TOC Progression Note (Signed)
 Transition of Care [facility]) - Progression Note    Patient Details  Name: Cody Glenn MRN: [id number] Date of Birth: Oct 03, 1955  [facility] CM/SW Contact  Inocente GORMAN Kindle, LCSW Phone Number: 05/02/2024, 1:47 PM  Clinical Narrative:    [facility] able to accept patient today as his insurance approval will expire today.   CSW met with patient and informed him. He reported agreement for [facility] as transport.    Expected Discharge Plan: Skilled Nursing Facility Barriers to Discharge: Barriers Resolved               Expected Discharge Plan and Services In-house Referral: Clinical Social Work   Post Acute Care Choice: Skilled Nursing Facility Living arrangements for the past 2 months: Homeless Expected Discharge Date: 05/02/24                                     Social Drivers of Health (SDOH) Interventions SDOH Screenings   Food Insecurity: Food Insecurity Present (04/17/2024)  Housing: High Risk (04/30/2024)  Transportation Needs: Unmet Transportation Needs (04/30/2024)  Utilities: Not At Risk (04/17/2024)  Alcohol Screen: High Risk (04/30/2024)  Depression (PHQ2-9): Low Risk  (10/13/2020)  Financial Resource Strain: Low Risk  (04/30/2024)  Social Connections: Moderately Isolated (04/17/2024)  Tobacco Use: High Risk (04/15/2024)    Readmission Risk Interventions    02/22/2024    2:07 PM  Readmission Risk Prevention Plan  Post Dischage Appt Complete  Medication Screening Complete  Transportation Screening Complete

## 2024-05-02 NOTE — Progress Notes (Signed)
  Progress Note  Patient Name: Cody Glenn Date of Encounter: 05/02/2024 [facility] Cardiologist: None   Interval Summary   Reports feeling about the same today Tells me that he feels dry after having his IV Lasix   Says he has been ambulating a little in the hallways and has been doing well   Vital Signs Vitals:   05/02/24 0000 05/02/24 0401 05/02/24 0402 05/02/24 0443  BP: 97/67 114/72    Pulse: 84     Resp: 19  18   Temp: 98.1 F (36.7 C)  98.2 F (36.8 C)   TempSrc: Oral  Oral   SpO2: 100%  100%   Weight:    52.6 kg  Height:        Intake/Output Summary (Last 24 hours) at 05/02/2024 0844 Last data filed at 05/02/2024 0500 Gross per 24 hour  Intake 620.99 ml  Output 425 ml  Net 195.99 ml      05/02/2024    4:43 AM 05/01/2024    4:31 AM 04/30/2024    5:00 AM  Last 3 Weights  Weight (lbs) 115 lb 15.4 oz 119 lb 7.8 oz 125 lb 7.1 oz  Weight (kg) 52.6 kg 54.2 kg 56.9 kg     Telemetry/ECG  Sinus rhythm, HR 70s - Personally Reviewed  Physical Exam  GEN: No acute distress, chronically ill-appearing.   Cardiac: RRR, no murmurs, rubs, or gallops.  Respiratory: clear to auscultation anteriorly. GI: Soft, nontender, non-distended  MS: No edema  Assessment & Plan  68 y.o. male with a hx of alcoholism, tobacco abuse, cavitary lung lesion, DVT, PUD, pre-DM, HTN, spinal stenosis, cirrhosis, ascending aortic aneurysm (4.5cm by CT this admission), thrombocytopenia, chronic appearing anemia, homelessness who is being seen for CHF   Acute HFrEF Hypertension  LVEF 10% this admission, down from 55-60% 02/2024 BNP 3-4k this admission, trending down  Creatinine stable, trending down Hypokalemic this admission  BP stable, most recent 114/72 Only 425 cc urine output yesterday  Continue strict I&O's, daily weights, daily BMPs Previously on IV Lasix  80 mg BID with K 20 mEq BID -- BNP trending down, poor urine output, transition to PO Lasix  80 mg BID, changed to 20 mEq  K once daily, monitor BMP and adjust as needed Patient being seen by palliative medicine, now DNR-limited code Consider titration of GDMT once adequately diuresed   QTc prolongation  Measured at 518 ms on most recent EKG Avoid QT prolonging medications  Keep Mag > 2, K > 4   Per primary Sepsis Multifocal PNA Persistent lung cavitary lesions  Shigella gastroenteritis [facility] delirium Tobacco use disorder Alcohol abuse  COPD PUD Chronic anemia  Chronic pain  Thyroid  lab abnormalities   Diabetes  Electrolyte disturbances   For questions or updates, please contact [facility] Please consult [email or web address] for contact info under         Signed, Waddell DELENA Donath, PA-C

## 2024-05-02 NOTE — Discharge Summary (Signed)
 PATIENT DETAILS Name: Cody Glenn Age: 68 y.o. Sex: male Date of Birth: [birth year] MRN: [id number]. Admitting Physician: Cody Somerset, MD ERE:Tmphyu, Cody BRAVO, MD  Admit Date: 04/15/2024 Discharge date: 05/02/2024  Recommendations for Outpatient Follow-up:  Follow up with PCP in 1-2 weeks Please obtain CMP/CBC in one week  Admitted From:  Home  Disposition: Skilled nursing facility   Discharge Condition: good  CODE STATUS:   Code Status: Limited: Do not attempt resuscitation (DNR) -DNR-LIMITED -Do Not Intubate/DNI    Diet recommendation:  Diet Order             Diet - low sodium heart healthy           Diet regular Room service appropriate? Yes with Assist; Fluid consistency: Thin  Diet effective now                    Brief Summary: Patient is a 68 y.o.  male with history of EtOH use, known lung cavitary lesion, HTN, homelessness-who was found by a bystander in a parking lot-intoxicated-he was found to have hypoglycemia-CXR with cavitary lesion left upper lobe-PCCM consulted-underwent bronchoscopy-BAL cultures positive for Acinetobacter-further hospital course complicated by worsening respiratory failure-found to have new diagnosis of acute systolic heart failure.   Significant events: 9/9>> admit to [facility] 9/12>> bronchoscopy 9/20>> worsening respiratory distress-found to have new HFrEF. 9/22>> thoracocentesis by PCCM   Significant studies: 9/10>> CXR: Left upper lobe persistent cavitary process. 9/10>> CT chest:: Increasing thick-walled cavitary lesion in the left upper lobe-consistent with progressive cavitary pneumonia.  Will ground glass opacities in the lingula/left lower lobe. 9/19>> CXR: Worsening bilateral airspace opacities-likely edema. 9/21>> CTA chest: No PE, increased consolidative lung disease in the perihilar/suprahilar left lung-now incorporating the cavitary lesion seen previously. 9/21>> B/L LE Doppler: Acute DVT involving the right  femoral/right posterior tibial/right peroneal vein, finding consistent with age-indeterminate DVT on the left. 9/22>> echo: EF 10%   Significant microbiology data: 9/10>> COVID PCR: Negative 9/10>> rest virus panel: Negative 9/10>> blood culture: No growth 9/11>> stool GI pathogen panel: Enteroinvasive Shigella/E. coli. 9/12>> culture BAL: Acinetobacter 9/22>> pleural fluid culture: No growth   Procedures: See above.   Consults: PCCM Cardiology  Brief Hospital Course: Sepsis secondary to Acinetobacter pneumonia and infectious gastroenteritis. Sepsis physiology has resolved. Has completed a course of antibiotics.   Persistent left upper lobe cavitary lesion Acinetobacter pneumonia PCCM following S/p bronchoscopy Has completed a course of antibiotics-meropenem  discontinued in 9/24.   Shigella gastroenteritis Diarrhea has resolved Completed a course of Zithromax .   Acute hypoxic respiratory failure  secondary to acute HFrEF Hypoxia has improved with diuretics Currently on room air   Newly diagnosed acute HFrEF Suspicion for EtOH cardiomyopathy Reviewed cardiology consult note-not a candidate for advanced therapies Has been transition from IV Lasix  to oral Lasix -further dose adjustment/initiation of GDMT medications are planned by cardiology to be done in the outpatient setting.   Please ensure outpatient follow-up with cardiology.   QTc prolongation Avoid QTc prolonging agents Keep K> 4, Mg>2    B/L lower extremity DVT Continue Eliquis .   Probable cirrhosis Nodular liver seen on imaging studies Likely secondary to EtOH Supportive care for now-outpatient GI evaluation.   History of peptic ulcer disease Protonix    HTN BP stable Currently not on any antihypertensives Cardiology planning to initiate GDMT medications in the outpatient setting-on follow-up.   COPD Stable-not in exacerbation As needed bronchodilators   Chronic back/neck pain: Appears  stable Transdermal Lidoderm .   Tobacco abuse  Transdermal nicotine    EtOH use Treated with Ativan  per [facility] protocol during the earlier part of this hospitalization Now awake/alert and out of window for any further withdrawal symptoms.   Homelessness TOC eval prior to discharge.   Nutrition Status: Nutrition Problem: Severe Malnutrition Etiology: social / environmental circumstances, chronic illness (Chronic alcohol abuse and homelessness) Signs/Symptoms: severe muscle depletion, severe fat depletion Interventions: Refer to RD note for recommendations   Discharge Diagnoses:  Active Problems:   Alcohol use   Tobacco use   Primary hypertension   Osteoarthritis   Peptic ulcer without hemorrhage, perforation, or obstruction   Prediabetes   Problem related to housing and economic circumstances, unspecified   Hyponatremia   Normocytic anemia   Mild protein malnutrition   Acute hypoxemic respiratory failure (HCC)   Emphysema/COPD   Hypocalcemia   Hypomagnesemia   Protein-calorie malnutrition, severe   QT prolongation   Alcohol abuse   Goals of care, counseling/discussion   Palliative care by specialist   DNR (do not resuscitate)   Acute heart failure with reduced ejection fraction (HFrEF, <= 40%) (HCC)   Discharge Instructions:  Activity:  As tolerated with Full fall precautions use walker/cane & assistance as needed   Discharge Instructions     (HEART FAILURE PATIENTS) Call MD:  Anytime you have any of the following symptoms: 1) 3 pound weight gain in 24 hours or 5 pounds in 1 week 2) shortness of breath, with or without a dry hacking cough 3) swelling in the hands, feet or stomach 4) if you have to sleep on extra pillows at night in order to breathe.   Complete by: As directed    Call MD for:  difficulty breathing, headache or visual disturbances   Complete by: As directed    Diet - low sodium heart healthy   Complete by: As directed    Discharge instructions    Complete by: As directed    Follow with Primary MD  Brien Cody BRAVO, MD in 1-2 weeks  Please get a complete blood count and chemistry panel checked by your Primary MD at your next visit, and again as instructed by your Primary MD.  Get Medicines reviewed and adjusted: Please take all your medications with you for your next visit with your Primary MD  Laboratory/radiological data: Please request your Primary MD to go over all hospital tests and procedure/radiological results at the follow up, please ask your Primary MD to get all Hospital records sent to his/her office.  In some cases, they will be blood work, cultures and biopsy results pending at the time of your discharge. Please request that your primary care M.D. follows up on these results.  Also Note the following: If you experience worsening of your admission symptoms, develop shortness of breath, life threatening emergency, suicidal or homicidal thoughts you must seek medical attention immediately by calling 911 or calling your MD immediately  if symptoms less severe.  You must read complete instructions/literature along with all the possible adverse reactions/side effects for all the Medicines you take and that have been prescribed to you. Take any new Medicines after you have completely understood and accpet all the possible adverse reactions/side effects.   Do not drive when taking Pain medications or sleeping medications (Benzodaizepines)  Do not take more than prescribed Pain, Sleep and Anxiety Medications. It is not advisable to combine anxiety,sleep and pain medications without talking with your primary care practitioner  Special Instructions: If you have smoked or chewed Tobacco  in  the last 2 yrs please stop smoking, stop any regular Alcohol  and or any Recreational drug use.  Wear Seat belts while driving.  Please note: You were cared for by a hospitalist during your hospital stay. Once you are discharged, your primary  care physician will handle any further medical issues. Please note that NO REFILLS for any discharge medications will be authorized once you are discharged, as it is imperative that you return to your primary care physician (or establish a relationship with a primary care physician if you do not have one) for your post hospital discharge needs so that they can reassess your need for medications and monitor your lab values.   Increase activity slowly   Complete by: As directed       Allergies as of 05/02/2024       Reactions   Lactose Intolerance (gi)    Per pt report        Medication List     TAKE these medications    apixaban  5 MG Tabs tablet Commonly known as: ELIQUIS  10 mg p.o. twice daily, and on 10/1 switch to 5 mg p.o. twice daily   cyanocobalamin  1000 MCG tablet Take 1 tablet (1,000 mcg total) by mouth daily. Start taking on: May 03, 2024   folic acid  1 MG tablet Commonly known as: FOLVITE  Take 1 tablet (1 mg total) by mouth daily.   furosemide  80 MG tablet Commonly known as: LASIX  Take 1 tablet (80 mg total) by mouth daily. Start taking on: May 03, 2024   levalbuterol  1.25 MG/0.5ML nebulizer solution Commonly known as: XOPENEX  Take 1.25 mg by nebulization every 6 (six) hours as needed for wheezing or shortness of breath.   lidocaine  5 % Commonly known as: LIDODERM  Place 1 patch onto the skin daily. Remove & Discard patch within 12 hours or as directed by MD Start taking on: May 03, 2024   multivitamin with minerals Tabs tablet Take 1 tablet by mouth daily. Start taking on: May 03, 2024   nicotine  14 mg/24hr patch Commonly known as: NICODERM CQ  - dosed in mg/24 hours Place 1 patch (14 mg total) onto the skin daily. Start taking on: May 03, 2024   pantoprazole  40 MG tablet Commonly known as: PROTONIX  Take 1 tablet (40 mg total) by mouth daily. Start taking on: May 03, 2024   polyethylene glycol 17 g packet Commonly  known as: MIRALAX  / GLYCOLAX  Take 17 g by mouth daily as needed for mild constipation.   potassium chloride  SA 20 MEQ tablet Commonly known as: KLOR-CON  M Take 1 tablet (20 mEq total) by mouth daily. Start taking on: May 03, 2024   thiamine  100 MG tablet Commonly known as: VITAMIN B1 Take 1 tablet (100 mg total) by mouth daily.        Contact information for follow-up providers     [facility]. Go in 8 day(s).   Specialty: Cardiology Why: Hospital follow up 05/08/2024 @ 9 am PLEASE bring a current medication list to appointment FREE valet parking, Entrance C, off [address] [facility] entrance Contact information: [address] [place]  [address] [phone]        Brien Cody BRAVO, MD. Schedule an appointment as soon as possible for a visit in 1 week(s).   Specialty: Pulmonary Disease Contact information: [address] [place] [address] [phone]  Contact information for after-discharge care     Destination     [facility] .   Service: Skilled Nursing Contact information: [address] [place]  [address] [phone]                    Allergies  Allergen Reactions   Lactose Intolerance (Gi)     Per pt report     Other Procedures/Studies: DG CHEST PORT 1 VIEW Result Date: 04/29/2024 CLINICAL DATA:  Status post thoracentesis EXAM: PORTABLE CHEST 1 VIEW COMPARISON:  Chest x-ray 04/26/2024 FINDINGS: There small bilateral pleural effusions similar to the prior study. There is no pneumothorax. Heart is enlarged, unchanged. There central pulmonary vascular congestion. Patchy left upper lobe airspace opacities persist. The osseous structures are stable. IMPRESSION: 1. Small bilateral pleural effusions similar to the prior study. No pneumothorax. 2. Cardiomegaly with central pulmonary  vascular congestion. 3. Patchy left upper lobe airspace opacities persist. Electronically Signed   By: Greig Pique M.D.   On: 04/29/2024 17:06   ECHOCARDIOGRAM COMPLETE Result Date: 04/29/2024    ECHOCARDIOGRAM REPORT   Patient Name:   Cody Glenn Date of Exam: 04/29/2024 Medical Rec #:  [id number]         Height:       65.0 in Accession #:    [id number]        Weight:       149.9 lb Date of Birth:  [birth year]          BSA:          1.750 m Patient Age:    68 years          BP:           123/98 mmHg Patient Gender: M                 HR:           103 bpm. Exam Location:  Inpatient Procedure: 2D Echo and Intracardiac Opacification Agent (Both Spectral and Color            Flow Doppler were utilized during procedure). Indications:    Dyspnea, CHF  History:        Patient has prior history of Echocardiogram examinations. Risk                 Factors:Hypertension.  Sonographer:    Charmaine Gaskins Referring Phys: 3 DAWOOD S ELGERGAWY IMPRESSIONS  1. Left ventricular ejection fraction, by estimation, is 10%. The left ventricle has severely decreased function. The left ventricle demonstrates global hypokinesis. The left ventricular internal cavity size was mildly to moderately dilated. VTI is 10cm  concerning for low output heart failure. There appears to be exagerrated hypokinesis of the mid to distal LV / LV apex in an LAD territory. Cannot rule out underlying CAD.  2. Right ventricular systolic function is mildly reduced. The right ventricular size is normal.  3. The mitral valve is normal in structure. No evidence of mitral valve regurgitation.  4. The aortic valve is normal in structure. Aortic valve regurgitation is not visualized.  5. Left atrial size was moderately dilated. FINDINGS  Left Ventricle: Left ventricular ejection fraction, by estimation, is <20%. The left ventricle has severely decreased function. The left ventricle demonstrates regional wall motion abnormalities. Definity  contrast agent was  given IV to delineate the left ventricular endocardial borders. The left ventricular internal cavity size was mildly to moderately dilated. There is no left ventricular hypertrophy. Indeterminate diastolic filling  due to E-A fusion. Right Ventricle: The right ventricular size is normal. No increase in right ventricular wall thickness. Right ventricular systolic function is mildly reduced. Left Atrium: Left atrial size was moderately dilated. Right Atrium: Right atrial size was normal in size. Pericardium: There is no evidence of pericardial effusion. Mitral Valve: The mitral valve is normal in structure. No evidence of mitral valve regurgitation. Tricuspid Valve: The tricuspid valve is normal in structure. Tricuspid valve regurgitation is trivial. Aortic Valve: The aortic valve is normal in structure. Aortic valve regurgitation is not visualized. Pulmonic Valve: The pulmonic valve was normal in structure. Pulmonic valve regurgitation is trivial. Aorta: The aortic root is normal in size and structure. IAS/Shunts: No atrial level shunt detected by color flow Doppler.  LEFT VENTRICLE PLAX 2D LVIDd:         5.12 cm LVIDs:         4.46 cm LV PW:         0.95 cm LV IVS:        0.96 cm LVOT diam:     2.13 cm LV SV:         29 LV SV Index:   17 LVOT Area:     3.56 cm  LV Volumes (MOD) LV vol d, MOD A2C: 207.0 ml LV vol d, MOD A4C: 193.0 ml LV vol s, MOD A2C: 170.0 ml LV vol s, MOD A4C: 156.0 ml LV SV MOD A2C:     37.0 ml LV SV MOD A4C:     193.0 ml LV SV MOD BP:      35.0 ml RIGHT VENTRICLE RV Basal diam:  3.30 cm RV Mid diam:    2.72 cm RV S prime:     12.00 cm/s TAPSE (M-mode): 1.3 cm LEFT ATRIUM             Index        RIGHT ATRIUM           Index LA diam:        3.84 cm 2.19 cm/m   RA Area:     15.10 cm LA Vol (A2C):   77.3 ml 44.17 ml/m  RA Volume:   39.40 ml  22.51 ml/m LA Vol (A4C):   75.5 ml 43.14 ml/m LA Biplane Vol: 76.0 ml 43.43 ml/m  AORTIC VALVE LVOT Vmax:   61.60 cm/s LVOT Vmean:  40.300 cm/s LVOT VTI:     0.082 m  SHUNTS Systemic VTI:  0.08 m Systemic Diam: 2.13 cm Aditya Sabharwal Electronically signed by Ria Commander Signature Date/Time: 04/29/2024/2:33:25 PM    Final    VAS US  LOWER EXTREMITY VENOUS (DVT) Result Date: 04/28/2024  Lower Venous DVT Study Patient Name:  Cody Glenn  Date of Exam:   04/28/2024 Medical Rec #: [id number]          Accession #:    [id number] Date of Birth: 1956/06/09           Patient Gender: M Patient Age:   49 years Exam Location:  [facility] Procedure:      VAS US  LOWER EXTREMITY VENOUS (DVT) Referring Phys: DORN CHILL --------------------------------------------------------------------------------  Indications: Edema, and alcohol abuse, history of DVT, smoker.  Comparison Study: Previous study on 7.15.2025. Performing Technologist: Edilia Elden Appl  Examination Guidelines: A complete evaluation includes B-mode imaging, spectral Doppler, color Doppler, and power Doppler as needed of all accessible portions of each vessel. Bilateral testing is considered an integral part of a complete examination. Limited examinations  for reoccurring indications may be performed as noted. The reflux portion of the exam is performed with the patient in reverse Trendelenburg.  +---------+---------------+---------+-----------+----------+--------------+ RIGHT    CompressibilityPhasicitySpontaneityPropertiesThrombus Aging +---------+---------------+---------+-----------+----------+--------------+ CFV      Partial        Yes      Yes                                 +---------+---------------+---------+-----------+----------+--------------+ SFJ      Full           Yes      Yes                                 +---------+---------------+---------+-----------+----------+--------------+ FV Prox  None           No       No                                  +---------+---------------+---------+-----------+----------+--------------+ FV Mid   None           No        No                                  +---------+---------------+---------+-----------+----------+--------------+ FV DistalNone           No       No                                  +---------+---------------+---------+-----------+----------+--------------+ PFV      Full                                                        +---------+---------------+---------+-----------+----------+--------------+ POP      Partial        Yes      Yes                                 +---------+---------------+---------+-----------+----------+--------------+ PTV      Partial        Yes      Yes                                 +---------+---------------+---------+-----------+----------+--------------+ PERO     Partial        Yes      Yes                                 +---------+---------------+---------+-----------+----------+--------------+   +---------+---------------+---------+-----------+----------+--------------+ LEFT     CompressibilityPhasicitySpontaneityPropertiesThrombus Aging +---------+---------------+---------+-----------+----------+--------------+ CFV      Partial        Yes      Yes                                 +---------+---------------+---------+-----------+----------+--------------+ SFJ  Partial        Yes      Yes                                 +---------+---------------+---------+-----------+----------+--------------+ FV Prox  None           No       No                                  +---------+---------------+---------+-----------+----------+--------------+ FV Mid   None           No       No                                  +---------+---------------+---------+-----------+----------+--------------+ FV DistalPartial        Yes      Yes                                 +---------+---------------+---------+-----------+----------+--------------+ PFV      None           No       No                                   +---------+---------------+---------+-----------+----------+--------------+ POP      Partial        Yes      Yes                                 +---------+---------------+---------+-----------+----------+--------------+ PTV      Partial        Yes      Yes                                 +---------+---------------+---------+-----------+----------+--------------+ PERO     Partial        Yes      Yes                                 +---------+---------------+---------+-----------+----------+--------------+     Summary: RIGHT: - Findings consistent with acute deep vein thrombosis involving the right femoral vein, right posterior tibial veins, and right peroneal veins.  - Findings consistent with chronic deep vein thrombosis involving the right common femoral vein, SF junction, and right popliteal vein.  - No cystic structure found in the popliteal fossa.  LEFT: - Findings consistent with age indeterminate deep vein thrombosis involving the left femoral vein, left proximal profunda vein, left posterior tibial veins, and left peroneal veins.  - Findings consistent with chronic deep vein thrombosis involving the left common femoral vein, SF junction, and left popliteal vein.  - No cystic structure found in the popliteal fossa.  *See table(s) above for measurements and observations. Electronically signed by Penne Colorado MD on 04/28/2024 at 7:42:52 PM.    Final    CT Angio Chest Pulmonary Embolism (PE) W or WO Contrast Result Date: 04/28/2024 CLINICAL DATA:  Hypoglycemia. Clinical concern for pulmonary embolism. EXAM: CT ANGIOGRAPHY CHEST  WITH CONTRAST TECHNIQUE: Multidetector CT imaging of the chest was performed using the standard protocol during bolus administration of intravenous contrast. Multiplanar CT image reconstructions and MIPs were obtained to evaluate the vascular anatomy. RADIATION DOSE REDUCTION: This exam was performed according to the departmental dose-optimization program which  includes automated exposure control, adjustment of the mA and/or kV according to patient size and/or use of iterative reconstruction technique. CONTRAST:  75mL OMNIPAQUE  IOHEXOL  350 MG/ML SOLN COMPARISON:  Chest CT with contrast 04/17/2024 FINDINGS: Cardiovascular: The heart is enlarged. No substantial pericardial effusion. Coronary artery calcification is evident. Mild atherosclerotic calcification is noted in the wall of the thoracic aorta. Ascending thoracic aorta measures up to 4.5 cm diameter. Enlargement of the pulmonary outflow tract/main pulmonary arteries suggests pulmonary arterial hypertension. There is no filling defect within the opacified pulmonary arteries to suggest the presence of an acute pulmonary embolus. Mediastinum/Nodes: Similar appearance of mild mediastinal lymphadenopathy comparing to the CT scan from just over 1 week ago. Soft tissue attenuation in the hilar regions bilaterally compatible with central lung collapse/consolidation. The esophagus has normal imaging features. There is no axillary lymphadenopathy. Lungs/Pleura: Centrilobular and paraseptal emphysema evident. Increased consolidative lung disease in the parahilar and suprahilar left lung, now incorporating the cavitary lesion seen previously. The cavitary component towards the left lung apex now measures 5.8 x 3.9 cm compared to 6.6 x 4.9 cm previously. There is a new air-fluid level within this collection on today's study. The more caudal thick walled component of this cystic lesion seen previously has become obscured inferiorly by adjacent lung collapse/consolidation. There is worsening dependent collapse/consolidation of both lower lobes. Moderate bilateral pleural effusions evident. Upper Abdomen: Nodular liver contour suggests cirrhosis. The visualized portion of the upper abdomen is otherwise unremarkable. Musculoskeletal: No worrisome lytic or sclerotic osseous abnormality. Review of the MIP images confirms the above  findings. IMPRESSION: 1. No CT evidence for acute pulmonary embolus. 2. Increased consolidative lung disease in the parahilar and suprahilar left lung, now incorporating the cavitary lesion seen previously. The cavitary component towards the left lung apex now measures 5.8 x 3.9 cm compared to 6.6 x 4.9 cm previously. There is a new air-fluid level within this collection on today's study. The more caudal thick walled component of this cystic lesion seen previously has become obscured inferiorly by adjacent lung collapse/consolidation. 3. Worsening dependent collapse/consolidation of both lower lobes with worsening of the now moderate bilateral pleural effusions. 4. Similar appearance of mild mediastinal lymphadenopathy comparing to the CT scan from just over 1 week ago. 5. Nodular liver contour suggests cirrhosis. 6. Aortic Atherosclerosis (ICD10-I70.0) and Emphysema (ICD10-J43.9). Electronically Signed   By: Camellia Candle M.D.   On: 04/28/2024 08:44   DG Chest Port 1 View Result Date: 04/26/2024 CLINICAL DATA:  Hypoxia EXAM: PORTABLE CHEST 1 VIEW COMPARISON:  04/23/2024 FINDINGS: Stable cardiac silhouette. Severe diffuse bilateral airspace disease and interstitial opacities, worsening since prior study. Small bilateral pleural effusions, stable. IMPRESSION: Worsening bilateral interstitial and airspace opacities, likely worsening edema or infection. Small bilateral effusions, stable. Electronically Signed   By: Franky Crease M.D.   On: 04/26/2024 20:16   DG CHEST PORT 1 VIEW Result Date: 04/23/2024 CLINICAL DATA:  Shortness of breath EXAM: PORTABLE CHEST 1 VIEW COMPARISON:  Film from earlier in the same day FINDINGS: Cardiac shadow is prominent but stable. Lungs are well aerated bilaterally. Increasing interstitial and alveolar opacities are noted consistent with worsening edema. Increasing effusions are noted particularly on the right. No bony abnormality is  noted. IMPRESSION: Increasing CHF Electronically  Signed   By: Oneil Devonshire M.D.   On: 04/23/2024 23:05   DG Chest Port 1 View Result Date: 04/23/2024 CLINICAL DATA:  Shortness of breath. EXAM: PORTABLE CHEST 1 VIEW COMPARISON:  04/17/2024 FINDINGS: Interval progression of diffuse interstitial and patchy alveolar opacity. Pleuroparenchymal scarring in the left upper lobe is similar to prior. Pleural thickening versus small pleural effusion seen at the left base. The cardio pericardial silhouette is enlarged. No acute bony abnormality. Telemetry leads overlie the chest. IMPRESSION: Interval progression of diffuse interstitial and patchy alveolar opacity. Imaging features compatible with pulmonary edema or diffuse infection. Electronically Signed   By: Camellia Candle M.D.   On: 04/23/2024 06:52   CT CHEST W CONTRAST Result Date: 04/17/2024 CLINICAL DATA:  Pneumonia, complication suspected, xray done, short of breath EXAM: CT CHEST WITH CONTRAST TECHNIQUE: Multidetector CT imaging of the chest was performed during intravenous contrast administration. RADIATION DOSE REDUCTION: This exam was performed according to the departmental dose-optimization program which includes automated exposure control, adjustment of the mA and/or kV according to patient size and/or use of iterative reconstruction technique. CONTRAST:  50mL OMNIPAQUE  IOHEXOL  350 MG/ML SOLN COMPARISON:  04/17/2024, 02/26/2024 FINDINGS: Cardiovascular: The heart is unremarkable without pericardial effusion. 4.1 cm ascending thoracic aortic aneurysm. No evidence of dissection. Atherosclerosis of the aorta and coronary vasculature again noted. Mediastinum/Nodes: Stable borderline enlarged mediastinal and left hilar lymph nodes, largest measuring up to 9 mm in short axis. Thyroid , trachea, and esophagus are unremarkable. Lungs/Pleura: Trace bilateral pleural effusions, left greater than right. There is a large thick walled cavitary lesion within the left upper lobe, increased in size since prior study,  measuring 11.7 x 6.6 x 4.9 cm. The mural thickening is greatest along the inferior aspect of this lesion, with a small gas fluid level identified. There is direct communication between a left lower lobe subsegmental bronchus and this thick walled cavitary lesion, reference image 57 and 58 of series 4. Upper lobe predominant emphysema again noted. There are bibasilar areas of subpleural scarring and fibrosis unchanged. Progressive ground-glass airspace disease within the left lower lobe and lingula since prior exam, compatible with worsening inflammation or infection no evidence of pneumothorax. Upper Abdomen: No acute abnormality. Musculoskeletal: No acute or destructive bony abnormalities. Reconstructed images demonstrate no additional findings. IMPRESSION: 1. Increasing size of the thick walled cavitary lesion within the left upper lobe, consistent with progressive cavitary pneumonia. While the soft tissue nodule within the inferior aspect of the lesion is no longer visualized, there has been marked progression in the mural thickening seen along the inferior extent, consistent with progressive infection. There is communication between this cavitary lesion and a subsegmental left upper lobe bronchus as described above. 2. Worsening ground-glass airspace disease within the lingula and left lower lobe, compatible with worsening infection. 3. Trace bilateral pleural effusions, left greater than right. 4. Aortic Atherosclerosis (ICD10-I70.0) and Emphysema (ICD10-J43.9). Electronically Signed   By: Cody Daring M.D.   On: 04/17/2024 15:39   DG Abd Portable 1V Result Date: 04/17/2024 CLINICAL DATA:  Nausea, hypoglycemia EXAM: PORTABLE ABDOMEN - 1 VIEW COMPARISON:  Overlapping portions CT chest 02/26/2024 FINDINGS: Notable lower thoracic and lumbar spondylosis. Atherosclerosis is present, including aortoiliac atherosclerotic disease. Moderate degenerative arthropathy of both hips. Scattered gas in small and large  bowel without overtly dilated bowel identified. IMPRESSION: 1. Scattered gas in small and large bowel without overtly dilated bowel identified. 2. Lower thoracic and lumbar spondylosis. 3. Moderate degenerative arthropathy of  both hips. Electronically Signed   By: Ryan Salvage M.D.   On: 04/17/2024 10:41   DG Chest Port 1 View Result Date: 04/17/2024 EXAM: 1 VIEW XRAY OF THE CHEST 04/17/2024 06:22:11 AM COMPARISON: 02/21/2024 CLINICAL HISTORY: SOB (shortness of breath) [id number]. Per chart - Hypoglycemia; Alcohol Intoxication; Reason for exam - SOB FINDINGS: LUNGS AND PLEURA: Persistent pleural thickening over the left upper lobe. Persistent cavitary process within the left upper lobe measuring approximately 6.1 cm on today's study. Loculated air overlying the peripheral left upper lobe may reflect underlying bronchopulmonary fistula. New airspace disease is identified within the left base and right mid lung. No pleural effusion. HEART AND MEDIASTINUM: No acute abnormality of the cardiac and mediastinal silhouettes. BONES AND SOFT TISSUES: No acute osseous abnormality. Surgical clips at GE junction. IMPRESSION: 1. Persistent cavitary process within the left upper lobe, measuring approximately 6.1 cm, with associated pleural thickening. 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 2. New airspace disease within the left base and right mid lung. 3. No pleural effusion. Electronically signed by: Waddell Calk MD 04/17/2024 06:37 AM EDT RP Workstation: [id number]     TODAY-DAY OF DISCHARGE:  Subjective:   Cody Glenn today has no headache,no chest abdominal pain,no new weakness tingling or numbness, feels much better wants to go home today.   Objective:   Blood pressure 129/79, pulse 74, temperature 98.2 F (36.8 C), temperature source Oral, resp. rate 16, height 5' 5 (1.651 m), weight 52.6 kg, SpO2 100%.  Intake/Output Summary (Last  24 hours) at 05/02/2024 1120 Last data filed at 05/02/2024 0500 Gross per 24 hour  Intake 620.99 ml  Output 425 ml  Net 195.99 ml   Filed Weights   04/30/24 0500 05/01/24 0431 05/02/24 0443  Weight: 56.9 kg 54.2 kg 52.6 kg    Exam: Awake Alert, Oriented *3, No new F.N deficits, Normal affect [place].AT,PERRAL Supple Neck,No JVD, No cervical lymphadenopathy appriciated.  Symmetrical Chest wall movement, Good air movement bilaterally, CTAB RRR,No Gallops,Rubs or new Murmurs, No Parasternal Heave +ve B.Sounds, Abd Soft, Non tender, No organomegaly appriciated, No rebound -guarding or rigidity. No Cyanosis, Clubbing or edema, No new Rash or bruise   PERTINENT RADIOLOGIC STUDIES: No results found.   PERTINENT LAB RESULTS: CBC: Recent Labs    04/30/24 0226 05/01/24 0321  WBC 10.2 9.6  HGB 9.3* 11.2*  HCT 29.1* 34.8*  PLT 286 331   CMET CMP     Component Value Date/Time   NA 134 (L) 05/02/2024 0421   NA 140 10/13/2020 1408   K 4.1 05/02/2024 0421   CL 98 05/02/2024 0421   CO2 26 05/02/2024 0421   GLUCOSE 92 05/02/2024 0421   BUN 10 05/02/2024 0421   BUN 7 (L) 10/13/2020 1408   CREATININE 0.56 (L) 05/02/2024 0421   CALCIUM  8.5 (L) 05/02/2024 0421   PROT 6.7 04/29/2024 1913   PROT 8.1 10/13/2020 1408   ALBUMIN 1.6 (L) 04/22/2024 0445   ALBUMIN 4.8 10/13/2020 1408   AST 26 04/22/2024 0445   ALT 20 04/22/2024 0445   ALKPHOS 53 04/22/2024 0445   BILITOT 0.4 04/22/2024 0445   BILITOT 0.6 10/13/2020 1408   EGFR 102 10/13/2020 1408   GFRNONAA >60 05/02/2024 0421    GFR Estimated Creatinine Clearance: 66.7 mL/min (A) (by C-G formula based on SCr of 0.56 mg/dL (L)). No results for input(s): LIPASE, AMYLASE in the last 72 hours. No results for input(s): CKTOTAL, CKMB, CKMBINDEX, TROPONINI in the  last 72 hours. Invalid input(s): POCBNP No results for input(s): DDIMER in the last 72 hours. No results for input(s): HGBA1C in the last 72 hours. No results  for input(s): CHOL, HDL, LDLCALC, TRIG, CHOLHDL, LDLDIRECT in the last 72 hours. No results for input(s): TSH, T4TOTAL, T3FREE, THYROIDAB in the last 72 hours.  Invalid input(s): FREET3 No results for input(s): VITAMINB12, FOLATE, FERRITIN, TIBC, IRON, RETICCTPCT in the last 72 hours. Coags: No results for input(s): INR in the last 72 hours.  Invalid input(s): PT Microbiology: Recent Results (from the past 240 hours)  Body fluid culture w Gram Stain     Status: None (Preliminary result)   Collection Time: 04/29/24  3:49 PM   Specimen: Pleural Fluid  Result Value Ref Range Status   Specimen Description FLUID PLEURAL  Final   Special Requests NONE  Final   Gram Stain NO WBC SEEN NO ORGANISMS SEEN CYTOSPIN SMEAR   Final   Culture   Final    NO GROWTH 3 DAYS Performed at [facility], [address], [place], [place] [address]    Report Status PENDING  Incomplete    FURTHER DISCHARGE INSTRUCTIONS:  Get Medicines reviewed and adjusted: Please take all your medications with you for your next visit with your Primary MD  Laboratory/radiological data: Please request your Primary MD to go over all hospital tests and procedure/radiological results at the follow up, please ask your Primary MD to get all Hospital records sent to his/her office.  In some cases, they will be blood work, cultures and biopsy results pending at the time of your discharge. Please request that your primary care M.D. goes through all the records of your hospital data and follows up on these results.  Also Note the following: If you experience worsening of your admission symptoms, develop shortness of breath, life threatening emergency, suicidal or homicidal thoughts you must seek medical attention immediately by calling 911 or calling your MD immediately  if symptoms less severe.  You must read complete instructions/literature along with all the possible adverse  reactions/side effects for all the Medicines you take and that have been prescribed to you. Take any new Medicines after you have completely understood and accpet all the possible adverse reactions/side effects.   Do not drive when taking Pain medications or sleeping medications (Benzodaizepines)  Do not take more than prescribed Pain, Sleep and Anxiety Medications. It is not advisable to combine anxiety,sleep and pain medications without talking with your primary care practitioner  Special Instructions: If you have smoked or chewed Tobacco  in the last 2 yrs please stop smoking, stop any regular Alcohol  and or any Recreational drug use.  Wear Seat belts while driving.  Please note: You were cared for by a hospitalist during your hospital stay. Once you are discharged, your primary care physician will handle any further medical issues. Please note that NO REFILLS for any discharge medications will be authorized once you are discharged, as it is imperative that you return to your primary care physician (or establish a relationship with a primary care physician if you do not have one) for your post hospital discharge needs so that they can reassess your need for medications and monitor your lab values.  Total Time spent coordinating discharge including counseling, education and face to face time equals greater than 30 minutes.  SignedBETHA Donalda Applebaum 05/02/2024 11:20 AM

## 2024-05-02 NOTE — Plan of Care (Signed)
  Problem: Education: Goal: Knowledge of General Education information will improve Description: Including pain rating scale, medication(s)/side effects and non-pharmacologic comfort measures Outcome: Adequate for Discharge   Problem: Health Behavior/Discharge Planning: Goal: Ability to manage health-related needs will improve Outcome: Adequate for Discharge   Problem: Clinical Measurements: Goal: Ability to maintain clinical measurements within normal limits will improve Outcome: Adequate for Discharge Goal: Will remain free from infection Outcome: Adequate for Discharge Goal: Diagnostic test results will improve Outcome: Adequate for Discharge Goal: Respiratory complications will improve Outcome: Adequate for Discharge Goal: Cardiovascular complication will be avoided Outcome: Adequate for Discharge   Problem: Activity: Goal: Risk for activity intolerance will decrease Outcome: Adequate for Discharge   Problem: Nutrition: Goal: Adequate nutrition will be maintained Outcome: Adequate for Discharge   Problem: Coping: Goal: Level of anxiety will decrease Outcome: Adequate for Discharge   Problem: Elimination: Goal: Will not experience complications related to bowel motility Outcome: Adequate for Discharge Goal: Will not experience complications related to urinary retention Outcome: Adequate for Discharge   Problem: Pain Managment: Goal: General experience of comfort will improve and/or be controlled Outcome: Adequate for Discharge   Problem: Safety: Goal: Ability to remain free from injury will improve Outcome: Adequate for Discharge   Problem: Skin Integrity: Goal: Risk for impaired skin integrity will decrease Outcome: Adequate for Discharge   Problem: Education: Goal: Ability to describe self-care measures that may prevent or decrease complications (Diabetes Survival Skills Education) will improve Outcome: Adequate for Discharge Goal: Individualized Educational  Video(s) Outcome: Adequate for Discharge   Problem: Coping: Goal: Ability to adjust to condition or change in health will improve Outcome: Adequate for Discharge   Problem: Fluid Volume: Goal: Ability to maintain a balanced intake and output will improve Outcome: Adequate for Discharge   Problem: Health Behavior/Discharge Planning: Goal: Ability to identify and utilize available resources and services will improve Outcome: Adequate for Discharge Goal: Ability to manage health-related needs will improve Outcome: Adequate for Discharge   Problem: Metabolic: Goal: Ability to maintain appropriate glucose levels will improve Outcome: Adequate for Discharge   Problem: Nutritional: Goal: Maintenance of adequate nutrition will improve Outcome: Adequate for Discharge Goal: Progress toward achieving an optimal weight will improve Outcome: Adequate for Discharge   Problem: Skin Integrity: Goal: Risk for impaired skin integrity will decrease Outcome: Adequate for Discharge   Problem: Tissue Perfusion: Goal: Adequacy of tissue perfusion will improve Outcome: Adequate for Discharge   Problem: Education: Goal: Ability to demonstrate management of disease process will improve Outcome: Adequate for Discharge Goal: Ability to verbalize understanding of medication therapies will improve Outcome: Adequate for Discharge Goal: Individualized Educational Video(s) Outcome: Adequate for Discharge   Problem: Activity: Goal: Capacity to carry out activities will improve Outcome: Adequate for Discharge   Problem: Cardiac: Goal: Ability to achieve and maintain adequate cardiopulmonary perfusion will improve Outcome: Adequate for Discharge

## 2024-05-02 NOTE — Telephone Encounter (Signed)
 Patient Product/process development scientist completed.    The patient is insured through [employer]. Patient has Medicare and is not eligible for a copay card, but may be able to apply for patient assistance or Medicare RX Payment Plan (Patient Must reach out to their plan, if eligible for payment plan), if available.    Ran test claim for Eliquis 5 mg and the current 30 day co-pay is $12.15.   This test claim was processed through [facility]- copay amounts may vary at other pharmacies due to pharmacy/plan contracts, or as the patient moves through the different stages of their insurance plan.     Roland Earl, CPHT Pharmacy Technician III Certified Patient Advocate [facility] Patient Advocate Team Direct Number: [phone]  Fax: [phone]

## 2024-05-02 NOTE — Plan of Care (Signed)
  Problem: Education: Goal: Knowledge of General Education information will improve Description: Including pain rating scale, medication(s)/side effects and non-pharmacologic comfort measures Outcome: Progressing   Problem: Clinical Measurements: Goal: Diagnostic test results will improve Outcome: Progressing Goal: Respiratory complications will improve Outcome: Progressing Goal: Cardiovascular complication will be avoided Outcome: Progressing   Problem: Nutrition: Goal: Adequate nutrition will be maintained Outcome: Progressing   Problem: Coping: Goal: Level of anxiety will decrease Outcome: Progressing   Problem: Elimination: Goal: Will not experience complications related to bowel motility Outcome: Progressing Goal: Will not experience complications related to urinary retention Outcome: Progressing   Problem: Pain Managment: Goal: General experience of comfort will improve and/or be controlled Outcome: Progressing   Problem: Safety: Goal: Ability to remain free from injury will improve Outcome: Progressing   Problem: Skin Integrity: Goal: Risk for impaired skin integrity will decrease Outcome: Progressing

## 2024-05-02 NOTE — TOC Transition Note (Signed)
 Transition of Care [facility]) - Discharge Note   Patient Details  Name: Cody Glenn MRN: [id number] Date of Birth: [birth year]  [facility]) CM/SW Contact:  Inocente GORMAN Kindle, LCSW Phone Number: 05/02/2024, 1:48 PM   Clinical Narrative:    Patient will DC to: [facility] Anticipated DC date: 05/02/24 Family notified: Pt notified friend Transport by: [place]   Per MD patient ready for DC to [facility]. RN to call report prior to discharge [phone] room 503-A). RN, patient, patient's family, and facility notified of DC. Discharge Summary and FL2 sent to facility. DC packet on chart including signed DNR. Ambulance transport requested for patient.   CSW will sign off for now as social work intervention is no longer needed. Please consult us  again if new needs arise.     Final next level of care: Skilled Nursing Facility Barriers to Discharge: Barriers Resolved   Patient Goals and CMS Choice Patient states their goals for this hospitalization and ongoing recovery are:: To go to rehab CMS Medicare.gov Compare Post Acute Care list provided to:: Patient Choice offered to / list presented to : Patient [place] ownership interest in [employer] [employer].provided to:: Patient    Discharge Placement   Existing PASRR number confirmed : 05/02/24          Patient chooses bed at: [facility] Patient to be transferred to facility by: [facility] Name of family member notified: Declined Patient and family notified of of transfer: 05/02/24  Discharge Plan and Services Additional resources added to the After Visit Summary for   In-house Referral: Clinical Social Work   Post Acute Care Choice: Skilled Nursing Facility                               Social Drivers of Health (SDOH) Interventions SDOH Screenings   Food Insecurity: Food Insecurity Present (04/17/2024)  Housing: High Risk (04/30/2024)  Transportation Needs: Unmet Transportation Needs  (04/30/2024)  Utilities: Not At Risk (04/17/2024)  Alcohol Screen: High Risk (04/30/2024)  Depression (PHQ2-9): Low Risk  (10/13/2020)  Financial Resource Strain: Low Risk  (04/30/2024)  Social Connections: Moderately Isolated (04/17/2024)  Tobacco Use: High Risk (04/15/2024)     Readmission Risk Interventions    02/22/2024    2:07 PM  Readmission Risk Prevention Plan  Post Dischage Appt Complete  Medication Screening Complete  Transportation Screening Complete

## 2024-05-03 LAB — BODY FLUID CULTURE W GRAM STAIN
Culture: NO GROWTH
Gram Stain: NONE SEEN

## 2024-05-03 NOTE — Progress Notes (Signed)
 HEART & VASCULAR TRANSITION OF CARE CONSULT NOTE     PCP: [name], MD   Chief Complaint: Recently diagnosed HFrEF  HPI: Referred to clinic by Dr. Raenelle for heart failure consultation.   Cody Glenn is a 68 y.o. homeless male with recently diagnosed HFrEF, DVT, PUD, known lung cavitary lesion, hypertension, tobacco abuse and EtOH abuse.  Admitted 7/25 with cough, hemoptysis and hypoxia.  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  Echo at that time showed EF 55 to 60% and no RWMA.  Admitted 9/25 after being found in a parking lot intoxicated, found to be hypoglycemic.  He underwent bronchoscopy, BAL cultures positive for Acinetobacter.  Acinetobacter pneumonia treated with antibiotics.  Underwent thoracentesis.  Echo showed EF ~10%, LV with GHK, RV mildly reduced, no MR, trivial TR.  A1c 4.1.  GDMT limited in the setting of soft BP.  Today he presents for HF [facility] clinic visit. Overall feeling ***. Denies palpitations, CP, dizziness, edema, or PND/Orthopnea. *** SOB. Appetite ok. No fever or chills. Weight at home *** pounds. Taking all medications. Denies ETOH, tobacco or drug use.     Past Medical History:  Diagnosis Date   Alcohol use    Anemia    Cavitary lung disease    Cirrhosis (HCC)    Homelessness    Hyperbilirubinemia 07/01/2020   Peptic ulcer 08/09/1987   Jul 28, 2008 Entered By: KATHERINE ETHA HERO Comment: s/p partial gastrectomy   Thoracic ascending aortic aneurysm    Thrombocytopenia    Tobacco abuse     Current Outpatient Medications  Medication Sig Dispense Refill   apixaban  (ELIQUIS ) 5 MG TABS tablet 10 mg p.o. twice daily, and on 10/1 switch to 5 mg p.o. twice daily     cyanocobalamin  1000 MCG tablet Take 1 tablet (1,000 mcg total) by mouth daily.     folic acid  (FOLVITE ) 1 MG tablet Take 1 tablet (1 mg total) by mouth daily. 90 tablet 0   furosemide  (LASIX ) 80 MG tablet Take 1 tablet (80 mg total) by mouth  daily.     levalbuterol  (XOPENEX ) 1.25 MG/0.5ML nebulizer solution Take 1.25 mg by nebulization every 6 (six) hours as needed for wheezing or shortness of breath.     lidocaine  (LIDODERM ) 5 % Place 1 patch onto the skin daily. Remove & Discard patch within 12 hours or as directed by MD     Multiple Vitamin (MULTIVITAMIN WITH MINERALS) TABS tablet Take 1 tablet by mouth daily.     nicotine  (NICODERM CQ  - DOSED IN MG/24 HOURS) 14 mg/24hr patch Place 1 patch (14 mg total) onto the skin daily.     pantoprazole  (PROTONIX ) 40 MG tablet Take 1 tablet (40 mg total) by mouth daily.     polyethylene glycol (MIRALAX  / GLYCOLAX ) 17 g packet Take 17 g by mouth daily as needed for mild constipation.     potassium chloride  SA (KLOR-CON  M) 20 MEQ tablet Take 1 tablet (20 mEq total) by mouth daily.     thiamine  (VITAMIN B1) 100 MG tablet Take 1 tablet (100 mg total) by mouth daily. 90 tablet 0   No current facility-administered medications for this visit.    Allergies  Allergen Reactions   Lactose Intolerance (Gi)     Per pt report      Social History   Socioeconomic History   Marital status: Single    Spouse name: Not on file   Number of children: 0  Years of education: Not on file   Highest education level: High school graduate  Occupational History   Occupation: unemployed  Tobacco Use   Smoking status: Every Day    Current packs/day: 1.00    Average packs/day: 1 pack/day for 40.0 years (40.0 ttl pk-yrs)    Types: Cigarettes   Smokeless tobacco: Never  Vaping Use   Vaping status: Never Used  Substance and Sexual Activity   Alcohol use: Yes    Alcohol/week: 12.0 standard drinks of alcohol    Types: 12 Cans of beer per week    Comment: daily 12- 24 beers per notes   Drug use: Not Currently   Sexual activity: Not on file  Other Topics Concern   Not on file  Social History Narrative   Not on file   Social Drivers of Health   Financial Resource Strain: Low Risk  (04/30/2024)    Overall Financial Resource Strain (CARDIA)    Difficulty of Paying Living Expenses: Not very hard  Food Insecurity: Food Insecurity Present (04/17/2024)   Hunger Vital Sign    Worried About Running Out of Food in the Last Year: Often true    Ran Out of Food in the Last Year: Often true  Transportation Needs: Unmet Transportation Needs (04/30/2024)   PRAPARE - Administrator, Civil Service (Medical): Yes    Lack of Transportation (Non-Medical): Yes  Physical Activity: Not on file  Stress: Not on file  Social Connections: Moderately Isolated (04/17/2024)   Social Connection and Isolation Panel    Frequency of Communication with Friends and Family: Once a week    Frequency of Social Gatherings with Friends and Family: Three times a week    Attends Religious Services: 1 to 4 times per year    Active Member of Clubs or Organizations: No    Attends Banker Meetings: Never    Marital Status: Never married  Intimate Partner Violence: Not At Risk (04/17/2024)   Humiliation, Afraid, Rape, and Kick questionnaire    Fear of Current or Ex-Partner: No    Emotionally Abused: No    Physically Abused: No    Sexually Abused: No      Family History  Family history unknown: Yes    There were no vitals filed for this visit.  PHYSICAL EXAM: General:  *** appearing.  No respiratory difficulty Neck: JVD *** cm.  Cor: Regular rate & rhythm. No murmurs. Lungs: clear Extremities: no edema  Neuro: alert & oriented x 3. Affect pleasant.   ECG:   ASSESSMENT & PLAN: Recently diagnosed HFrEF -Etiology uncertain, doubt 2/2 EtOH as EF was normal 2 months ago.  Suspect viral CM or ICM. -Echo 7/25: EF 55-60%, no RWMA -Echo 9/25 EF ~10%, LV with GHK, RV mildly reduced, no MR, trivial TR. -NYHA *** -Volume ***.  Continue Lasix  80 mg daily -GDMT limited by soft BP.  Would avoid SGLT2 with low A1c and recent hypoglycemia. -Not a candidate for advanced therapies with failure to  thrive , tobacco/EtOH abuse and multiple comorbidities.  Hypertension -BP ***  Tobacco/EtOH abuse -Cessation advised  DVT -Continue Eliquis  5 mg twice daily (as of today)  Cavitary lung lesion - Has follow-up with pulmonology,  SDOH Failure to thrive  -Homeless, engage HF SW *** - Food bag ***     Referred to HFSW (PCP, Medications, Transportation, ETOH Abuse, Drug Abuse, Insurance, Surveyor, quantity ): Yes Refer to Pharmacy: Yes or No Refer to Home Health: Yes on No Refer to Advanced  Heart Failure Clinic: No  Refer to General Cardiology: Yes  Follow up

## 2024-05-05 LAB — CHOLESTEROL, BODY FLUID: Cholesterol, Fluid: 9 mg/dL

## 2024-05-07 ENCOUNTER — Telehealth (HOSPITAL_COMMUNITY): Payer: Self-pay

## 2024-05-07 NOTE — Telephone Encounter (Signed)
 Called to confirm/remind patient of their appointment at the Advanced Heart Failure Clinic on 05/08/24 9:00.   Appointment:   [] Confirmed  [] Left mess   [] No answer/No voice mail  [] VM Full/unable to leave message  [x] Phone not in service  Patient reminded to bring all medications and/or complete list.  Confirmed patient has transportation. Gave directions, instructed to utilize valet parking.

## 2024-05-08 ENCOUNTER — Ambulatory Visit (HOSPITAL_COMMUNITY): Payer: Self-pay | Admitting: Internal Medicine

## 2024-05-08 ENCOUNTER — Encounter (HOSPITAL_COMMUNITY): Payer: Self-pay

## 2024-05-08 ENCOUNTER — Ambulatory Visit (HOSPITAL_COMMUNITY)
Admit: 2024-05-08 | Discharge: 2024-05-08 | Disposition: A | Discharge status: Home / Self Care | Attending: Internal Medicine | Admitting: Internal Medicine

## 2024-05-08 VITALS — BP 116/80 | HR 85 | Ht 65.0 in | Wt 149.9 lb

## 2024-05-08 DIAGNOSIS — Z5982 Transportation insecurity: Secondary | ICD-10-CM | POA: Insufficient documentation

## 2024-05-08 DIAGNOSIS — I82413 Acute embolism and thrombosis of femoral vein, bilateral: Secondary | ICD-10-CM | POA: Diagnosis not present

## 2024-05-08 DIAGNOSIS — I110 Hypertensive heart disease with heart failure: Secondary | ICD-10-CM | POA: Diagnosis not present

## 2024-05-08 DIAGNOSIS — J984 Other disorders of lung: Secondary | ICD-10-CM | POA: Insufficient documentation

## 2024-05-08 DIAGNOSIS — I10 Essential (primary) hypertension: Secondary | ICD-10-CM | POA: Diagnosis not present

## 2024-05-08 DIAGNOSIS — R42 Dizziness and giddiness: Secondary | ICD-10-CM | POA: Insufficient documentation

## 2024-05-08 DIAGNOSIS — Z8701 Personal history of pneumonia (recurrent): Secondary | ICD-10-CM | POA: Insufficient documentation

## 2024-05-08 DIAGNOSIS — I82409 Acute embolism and thrombosis of unspecified deep veins of unspecified lower extremity: Secondary | ICD-10-CM | POA: Diagnosis not present

## 2024-05-08 DIAGNOSIS — Z8249 Family history of ischemic heart disease and other diseases of the circulatory system: Secondary | ICD-10-CM | POA: Diagnosis not present

## 2024-05-08 DIAGNOSIS — R627 Adult failure to thrive: Secondary | ICD-10-CM | POA: Insufficient documentation

## 2024-05-08 DIAGNOSIS — Z79899 Other long term (current) drug therapy: Secondary | ICD-10-CM | POA: Diagnosis not present

## 2024-05-08 DIAGNOSIS — Z8711 Personal history of peptic ulcer disease: Secondary | ICD-10-CM | POA: Insufficient documentation

## 2024-05-08 DIAGNOSIS — Z139 Encounter for screening, unspecified: Secondary | ICD-10-CM | POA: Diagnosis not present

## 2024-05-08 DIAGNOSIS — Z7901 Long term (current) use of anticoagulants: Secondary | ICD-10-CM | POA: Insufficient documentation

## 2024-05-08 DIAGNOSIS — Z720 Tobacco use: Secondary | ICD-10-CM | POA: Diagnosis not present

## 2024-05-08 DIAGNOSIS — F1010 Alcohol abuse, uncomplicated: Secondary | ICD-10-CM | POA: Diagnosis not present

## 2024-05-08 DIAGNOSIS — I5022 Chronic systolic (congestive) heart failure: Secondary | ICD-10-CM | POA: Insufficient documentation

## 2024-05-08 LAB — BASIC METABOLIC PANEL WITH GFR
Anion gap: 11 (ref 5–15)
BUN: 11 mg/dL (ref 8–23)
CO2: 25 mmol/L (ref 22–32)
Calcium: 9.1 mg/dL (ref 8.9–10.3)
Chloride: 93 mmol/L — ABNORMAL LOW (ref 98–111)
Creatinine, Ser: 0.69 mg/dL (ref 0.61–1.24)
GFR, Estimated: >60 mL/min (ref >60)
Glucose, Bld: 109 mg/dL — ABNORMAL HIGH (ref 70–99)
Potassium: 3.6 mmol/L (ref 3.5–5.1)
Sodium: 129 mmol/L — ABNORMAL LOW (ref 135–145)

## 2024-05-08 LAB — CBC
HCT: 36.7 % — ABNORMAL LOW (ref 39.0–52.0)
Hemoglobin: 12.6 g/dL — ABNORMAL LOW (ref 13.0–17.0)
MCH: 33.2 pg (ref 26.0–34.0)
MCHC: 34.3 g/dL (ref 30.0–36.0)
MCV: 96.6 fL (ref 80.0–100.0)
Platelets: 529 K/uL — ABNORMAL HIGH (ref 150–400)
RBC: 3.8 MIL/uL — ABNORMAL LOW (ref 4.22–5.81)
RDW: 14.1 % (ref 11.5–15.5)
WBC: 9.2 K/uL (ref 4.0–10.5)
nRBC: 0 % (ref 0.0–0.2)

## 2024-05-08 LAB — BRAIN NATRIURETIC PEPTIDE: B Natriuretic Peptide: 416.7 pg/mL — ABNORMAL HIGH (ref 0.0–100.0)

## 2024-05-08 NOTE — Progress Notes (Signed)
 ReDS Vest / Clip - 05/08/24 0906       ReDS Vest / Clip   Station Marker A    Ruler Value 27.5    ReDS Value Range Low volume    ReDS Actual Value 18

## 2024-05-08 NOTE — Patient Instructions (Addendum)
 There has been no changes to your medications.  Labs done today, your results will be available in MyChart, we will contact you for abnormal readings.  You have been referred to the General Cardiologist. They will call you to arrange your appointment.  Thank you for allowing us  to provider your heart failure care after your recent hospitalization. Please follow-up as scheduled   If you have any questions, issues, or concerns before your next appointment please call our office at [phone], opt. 2 and leave a message for the triage nurse.

## 2024-05-08 NOTE — Addendum Note (Signed)
 Encounter addended by: Micael Sueanne LABOR, CMA on: 05/08/2024 10:09 AM  Actions taken: Order list changed, Diagnosis association updated, Flowsheet accepted, Clinical Note Signed

## 2024-05-10 DIAGNOSIS — F4321 Adjustment disorder with depressed mood: Secondary | ICD-10-CM | POA: Diagnosis not present

## 2024-05-15 LAB — MISCELLANEOUS TEST

## 2024-05-21 LAB — FUNGUS CULTURE WITH STAIN

## 2024-05-21 LAB — FUNGAL ORGANISM REFLEX

## 2024-05-21 LAB — FUNGUS CULTURE RESULT

## 2024-05-23 DIAGNOSIS — F4321 Adjustment disorder with depressed mood: Secondary | ICD-10-CM | POA: Diagnosis not present

## 2024-05-28 ENCOUNTER — Telehealth (HOSPITAL_COMMUNITY): Payer: Self-pay

## 2024-05-28 NOTE — Progress Notes (Signed)
 HEART IMPACT TRANSITIONS OF CARE    PCP: [name], MD  Primary Cardiologist:  Chief Complaint: chronic HFrEF  HPI: Cody Glenn is a 68 y.o. homeless male with recently diagnosed HFrEF, DVT, PUD, known lung cavitary lesion, hypertension, tobacco abuse and EtOH abuse.   Admitted 7/25 with cough, hemoptysis and hypoxia.  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  Echo at that time showed EF 55 to 60% and no RWMA.   Admitted 9/25 after being found in a parking lot intoxicated, found to be hypoglycemic.  He underwent bronchoscopy, BAL cultures positive for Acinetobacter.  Acinetobacter pneumonia treated with antibiotics.  Underwent thoracentesis.  Echo showed EF ~10%, LV with GHK, RV mildly reduced, no MR, trivial TR.  A1c 4.1.  GDMT limited in the setting of soft BP.  Today he returns for AHF TOC follow up. Remains at [facility] and healthcare center. Overall feeling ok. Denies palpitations, CP, edema, or PND/Orthopnea. Dizziness when standing. SOB at baseline, ongoing. Appetite not too good. No fever or chills. Weight at facility 105, gets weighted once a week. Taking all medications. Works with PT at facility 2x/week.     Mom with CHF. Grandmother with HTN. Used to smoke 1 PPD for years, has not smoked with discharge. Used to drink a lot of liquor and beer prior to being in the facility.   ROS: All systems negative except as listed in HPI, PMH and Problem List.  SH:  Social History   Socioeconomic History   Marital status: Single    Spouse name: Not on file   Number of children: 0   Years of education: Not on file   Highest education level: High school graduate  Occupational History   Occupation: unemployed  Tobacco Use   Smoking status: Every Day    Current packs/day: 1.00    Average packs/day: 1 pack/day for 40.0 years (40.0 ttl pk-yrs)    Types: Cigarettes   Smokeless tobacco: Never  Vaping Use   Vaping status: Never  Used  Substance and Sexual Activity   Alcohol use: Yes    Alcohol/week: 12.0 standard drinks of alcohol    Types: 12 Cans of beer per week    Comment: daily 12- 24 beers per notes   Drug use: Not Currently   Sexual activity: Not on file  Other Topics Concern   Not on file  Social History Narrative   Not on file   Social Drivers of Health   Financial Resource Strain: Low Risk  (04/30/2024)   Overall Financial Resource Strain (CARDIA)    Difficulty of Paying Living Expenses: Not very hard  Food Insecurity: Food Insecurity Present (04/17/2024)   Hunger Vital Sign    Worried About Running Out of Food in the Last Year: Often true    Ran Out of Food in the Last Year: Often true  Transportation Needs: Unmet Transportation Needs (04/30/2024)   PRAPARE - Administrator, Civil Service (Medical): Yes    Lack of Transportation (Non-Medical): Yes  Physical Activity: Not on file  Stress: Not on file  Social Connections: Moderately Isolated (04/17/2024)   Social Connection and Isolation Panel    Frequency of Communication with Friends and Family: Once a week    Frequency of Social Gatherings with Friends and Family: Three times a week    Attends Religious Services: 1 to 4 times per year    Active Member of Clubs or Organizations: No  Attends Banker Meetings: Never    Marital Status: Never married  Intimate Partner Violence: Not At Risk (04/17/2024)   Humiliation, Afraid, Rape, and Kick questionnaire    Fear of Current or Ex-Partner: No    Emotionally Abused: No    Physically Abused: No    Sexually Abused: No    FH:  Family History  Family history unknown: Yes    Past Medical History:  Diagnosis Date   Alcohol use    Anemia    Cavitary lung disease    Cirrhosis (HCC)    Homelessness    Hyperbilirubinemia 07/01/2020   Peptic ulcer 08/09/1987   Jul 28, 2008 Entered By: KATHERINE ETHA HERO Comment: s/p partial gastrectomy   Thoracic ascending aortic  aneurysm    Thrombocytopenia    Tobacco abuse     Current Outpatient Medications  Medication Sig Dispense Refill   apixaban  (ELIQUIS ) 5 MG TABS tablet 10 mg p.o. twice daily, and on 10/1 switch to 5 mg p.o. twice daily     cyanocobalamin  1000 MCG tablet Take 1 tablet (1,000 mcg total) by mouth daily.     folic acid  (FOLVITE ) 1 MG tablet Take 1 tablet (1 mg total) by mouth daily. 90 tablet 0   furosemide  (LASIX ) 80 MG tablet Take 1 tablet (80 mg total) by mouth daily.     levalbuterol  (XOPENEX ) 1.25 MG/0.5ML nebulizer solution Take 1.25 mg by nebulization every 6 (six) hours as needed for wheezing or shortness of breath.     lidocaine  (LIDODERM ) 5 % Place 1 patch onto the skin daily. Remove & Discard patch within 12 hours or as directed by MD     Multiple Vitamin (MULTIVITAMIN WITH MINERALS) TABS tablet Take 1 tablet by mouth daily.     nicotine  (NICODERM CQ  - DOSED IN MG/24 HOURS) 14 mg/24hr patch Place 1 patch (14 mg total) onto the skin daily.     pantoprazole  (PROTONIX ) 40 MG tablet Take 1 tablet (40 mg total) by mouth daily.     polyethylene glycol (MIRALAX  / GLYCOLAX ) 17 g packet Take 17 g by mouth daily as needed for mild constipation.     potassium chloride  SA (KLOR-CON  M) 20 MEQ tablet Take 1 tablet (20 mEq total) by mouth daily.     thiamine  (VITAMIN B1) 100 MG tablet Take 1 tablet (100 mg total) by mouth daily. 90 tablet 0   No current facility-administered medications for this encounter.    Vitals:   05/29/24 1152  BP: 112/80  Pulse: 60  SpO2: 95%  Weight: 47.8 kg (105 lb 4.8 oz)  Height: 5' 6 (1.676 m)    PHYSICAL EXAM: General:  frail / chronically ill appearing.  Arrived in [facility] Neck: JVD ~6 cm.  Cor: Regular rate & rhythm. No murmurs. Lungs: diminished Extremities: no edema  Neuro: alert & oriented x 3. Affect pleasant.   Wt Readings from Last 3 Encounters:  05/29/24 47.8 kg (105 lb 4.8 oz)  05/29/24 74.8 kg (165 lb)  05/08/24 68 kg (149 lb 14.6 oz)    ReDs  reading: 27 %, normal   ECG: none today  ASSESSMENT & PLAN: Recently diagnosed HFrEF -Etiology uncertain, doubt 2/2 EtOH as EF was normal 2 months ago.  Suspect viral CM or ICM. -Echo 7/25: EF 55-60%, no RWMA -Echo 9/25 EF ~10%, LV with GHK, RV mildly reduced, no MR, trivial TR. -NYHA IIIa, mostly limited by frailty and deconditioning.  - Does not appear volume overloaded on exam. Continue Lasix  80  mg daily. Weights unreliable. Significant fluctuations, unable to weight/stand in clinic. Weight today was last recorded weight from facility 05/22/24. ReDS WNL. Suspect SOB primarily from cavitary lung lesions.  - GDMT limited by soft BP.  Would avoid SGLT2 with low A1c and recent hypoglycemia. - Will again hold off on adding spiro today with persistent intt dizziness - Can consider arranging cMRI to further investigate etiology of reduced EF.  - Not a candidate for advanced therapies with failure to thrive , tobacco/EtOH abuse and multiple comorbidities.   Hypertension -BP stable today   Tobacco/EtOH abuse - No longer smoking/drinking as he is at the facility.    DVT -Continue Eliquis  5 mg twice daily. Denies abnormal bleeding.    Cavitary lung lesion - Saw pulmonology before this visit. Sputum culture and CT ordered.  - Follow up scheduled with Dr. 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  - Follows with PCP, may need appetite stimulant. Needs f/u with Dr. Brien, will reach out for scheduling.     Follow up with [facility].

## 2024-05-28 NOTE — Telephone Encounter (Signed)
 Called to confirm/remind patient of their appointment at the Advanced [facility] Failure Clinic on 05/29/24 11:15.   Appointment:   [] Confirmed  [] Left mess   [] No answer/No voice mail  [] VM Full/unable to leave message  [x] Phone not in service  Patient reminded to bring all medications and/or complete list.  Confirmed patient has transportation. Gave directions, instructed to utilize valet parking.

## 2024-05-29 ENCOUNTER — Telehealth: Payer: Self-pay

## 2024-05-29 ENCOUNTER — Ambulatory Visit (HOSPITAL_COMMUNITY): Payer: Self-pay | Admitting: Internal Medicine

## 2024-05-29 ENCOUNTER — Encounter (HOSPITAL_COMMUNITY): Payer: Self-pay

## 2024-05-29 ENCOUNTER — Encounter: Payer: Self-pay | Admitting: Pulmonary Disease

## 2024-05-29 ENCOUNTER — Ambulatory Visit (INDEPENDENT_AMBULATORY_CARE_PROVIDER_SITE_OTHER): Admitting: Pulmonary Disease

## 2024-05-29 ENCOUNTER — Ambulatory Visit (HOSPITAL_COMMUNITY)
Admission: RE | Admit: 2024-05-29 | Discharge: 2024-05-29 | Disposition: A | Discharge status: Home / Self Care | Source: Ambulatory Visit | Attending: Internal Medicine | Admitting: Internal Medicine

## 2024-05-29 VITALS — BP 116/88 | HR 107 | Ht 65.0 in | Wt 105.3 lb

## 2024-05-29 VITALS — BP 112/80 | HR 60 | Ht 66.0 in | Wt 105.3 lb

## 2024-05-29 DIAGNOSIS — Z87891 Personal history of nicotine dependence: Secondary | ICD-10-CM | POA: Insufficient documentation

## 2024-05-29 DIAGNOSIS — Z8249 Family history of ischemic heart disease and other diseases of the circulatory system: Secondary | ICD-10-CM | POA: Insufficient documentation

## 2024-05-29 DIAGNOSIS — I82413 Acute embolism and thrombosis of femoral vein, bilateral: Secondary | ICD-10-CM | POA: Diagnosis not present

## 2024-05-29 DIAGNOSIS — Z139 Encounter for screening, unspecified: Secondary | ICD-10-CM | POA: Diagnosis not present

## 2024-05-29 DIAGNOSIS — Z8711 Personal history of peptic ulcer disease: Secondary | ICD-10-CM | POA: Insufficient documentation

## 2024-05-29 DIAGNOSIS — Z79899 Other long term (current) drug therapy: Secondary | ICD-10-CM | POA: Diagnosis not present

## 2024-05-29 DIAGNOSIS — Z720 Tobacco use: Secondary | ICD-10-CM | POA: Diagnosis not present

## 2024-05-29 DIAGNOSIS — J984 Other disorders of lung: Secondary | ICD-10-CM

## 2024-05-29 DIAGNOSIS — I110 Hypertensive heart disease with heart failure: Secondary | ICD-10-CM | POA: Insufficient documentation

## 2024-05-29 DIAGNOSIS — F17210 Nicotine dependence, cigarettes, uncomplicated: Secondary | ICD-10-CM | POA: Diagnosis not present

## 2024-05-29 DIAGNOSIS — F1010 Alcohol abuse, uncomplicated: Secondary | ICD-10-CM | POA: Diagnosis not present

## 2024-05-29 DIAGNOSIS — Z5900 Homelessness unspecified: Secondary | ICD-10-CM | POA: Insufficient documentation

## 2024-05-29 DIAGNOSIS — Z7901 Long term (current) use of anticoagulants: Secondary | ICD-10-CM | POA: Diagnosis not present

## 2024-05-29 DIAGNOSIS — I5022 Chronic systolic (congestive) heart failure: Secondary | ICD-10-CM | POA: Diagnosis not present

## 2024-05-29 DIAGNOSIS — Z86718 Personal history of other venous thrombosis and embolism: Secondary | ICD-10-CM | POA: Diagnosis not present

## 2024-05-29 DIAGNOSIS — Z5982 Transportation insecurity: Secondary | ICD-10-CM | POA: Insufficient documentation

## 2024-05-29 DIAGNOSIS — J432 Centrilobular emphysema: Secondary | ICD-10-CM | POA: Diagnosis not present

## 2024-05-29 DIAGNOSIS — R627 Adult failure to thrive: Secondary | ICD-10-CM

## 2024-05-29 DIAGNOSIS — I10 Essential (primary) hypertension: Secondary | ICD-10-CM | POA: Diagnosis not present

## 2024-05-29 DIAGNOSIS — E871 Hypo-osmolality and hyponatremia: Secondary | ICD-10-CM | POA: Diagnosis not present

## 2024-05-29 DIAGNOSIS — F1011 Alcohol abuse, in remission: Secondary | ICD-10-CM | POA: Insufficient documentation

## 2024-05-29 LAB — BASIC METABOLIC PANEL WITH GFR
Anion gap: 10 (ref 5–15)
BUN: 13 mg/dL (ref 8–23)
CO2: 18 mmol/L — ABNORMAL LOW (ref 22–32)
Calcium: 8.2 mg/dL — ABNORMAL LOW (ref 8.9–10.3)
Chloride: 99 mmol/L (ref 98–111)
Creatinine, Ser: 0.61 mg/dL (ref 0.61–1.24)
GFR, Estimated: >60 mL/min (ref >60)
Glucose, Bld: 106 mg/dL — ABNORMAL HIGH (ref 70–99)
Potassium: 3.3 mmol/L — ABNORMAL LOW (ref 3.5–5.1)
Sodium: 127 mmol/L — ABNORMAL LOW (ref 135–145)

## 2024-05-29 LAB — BRAIN NATRIURETIC PEPTIDE: B Natriuretic Peptide: 245.2 pg/mL — ABNORMAL HIGH (ref 0.0–100.0)

## 2024-05-29 NOTE — Patient Instructions (Signed)
 Medication Changes:  No Changes In Medications at this time.   Lab Work:  Labs done today, your results will be available in MyChart, we will contact you for abnormal readings.  Special Instructions // Education:  PLEASE SEE PRIMARY CARE LIST TO GET SCHEDULED WITH A PRIMARY CARE PHYSICIAN   Follow-Up in: WITH GENERAL CARDIOLOGY PLEASE CALL [phone] TO GET SCHEDULED   At the [facility], you and your health needs are our priority. We have a designated team specialized in the treatment of Heart Failure. This Care Team includes your primary Heart Failure Specialized Cardiologist (physician), Advanced Practice Providers (APPs- Physician Assistants and Nurse Practitioners), and Pharmacist who all work together to provide you with the care you need, when you need it.   You may see any of the following providers on your designated Care Team at your next follow up:  Dr. Toribio Fuel Dr. Ezra Shuck Dr. Ria Commander Dr. Odis Brownie Greig Mosses, NP Caffie Shed, [place] [facility] [place], [place] Beckey Coe, NP [place] Lee, NP Tinnie Redman, PharmD   Please be sure to bring in all your medications bottles to every appointment.   Need to Contact Us :  If you have any questions or concerns before your next appointment please send us  a message through [place] or call our office at [phone].    TO LEAVE A MESSAGE FOR THE NURSE SELECT OPTION 2, PLEASE LEAVE A MESSAGE INCLUDING: YOUR NAME DATE OF BIRTH CALL BACK NUMBER REASON FOR CALL**this is important as we prioritize the call backs  YOU WILL RECEIVE A CALL BACK THE SAME DAY AS LONG AS YOU CALL BEFORE 4:00 PM

## 2024-05-29 NOTE — Patient Instructions (Addendum)
 Continue levalbuterol  as needed for cough, wheezing or shortness of breath  We will check a sputum culture today  We will schedule you for CT Chest scan next month and a follow up visit

## 2024-05-29 NOTE — Progress Notes (Unsigned)
 Subjective:   PATIENT ID: Cody Glenn GENDER: male DOB: [birth year], MRN: [id number]   HPI Discussed the use of AI scribe software for clinical note transcription with the patient, who gave verbal consent to proceed.  History of Present Illness            Chief Complaint  Patient presents with   Consult    ***  Past Medical History:  Diagnosis Date   Alcohol use    Anemia    Cavitary lung disease    Cirrhosis (HCC)    Homelessness    Hyperbilirubinemia 07/01/2020   Peptic ulcer 08/09/1987   Jul 28, 2008 Entered By: KATHERINE ETHA HERO Comment: s/p partial gastrectomy   Thoracic ascending aortic aneurysm    Thrombocytopenia    Tobacco abuse      Family History  Family history unknown: Yes     Social History   Socioeconomic History   Marital status: Single    Spouse name: Not on file   Number of children: 0   Years of education: Not on file   Highest education level: High school graduate  Occupational History   Occupation: unemployed  Tobacco Use   Smoking status: Every Day    Current packs/day: 1.00    Average packs/day: 1 pack/day for 40.0 years (40.0 ttl pk-yrs)    Types: Cigarettes   Smokeless tobacco: Never  Vaping Use   Vaping status: Never Used  Substance and Sexual Activity   Alcohol use: Yes    Alcohol/week: 12.0 standard drinks of alcohol    Types: 12 Cans of beer per week    Comment: daily 12- 24 beers per notes   Drug use: Not Currently   Sexual activity: Not on file  Other Topics Concern   Not on file  Social History Narrative   Not on file   Social Drivers of Health   Financial Resource Strain: Low Risk  (04/30/2024)   Overall Financial Resource Strain (CARDIA)    Difficulty of Paying Living Expenses: Not very hard  Food Insecurity: Food Insecurity Present (04/17/2024)   Hunger Vital Sign    Worried About Running Out of Food in the Last Year: Often true    Ran Out of Food in the Last Year: Often true  Transportation Needs:  Unmet Transportation Needs (04/30/2024)   PRAPARE - Administrator, Civil Service (Medical): Yes    Lack of Transportation (Non-Medical): Yes  Physical Activity: Not on file  Stress: Not on file  Social Connections: Moderately Isolated (04/17/2024)   Social Connection and Isolation Panel    Frequency of Communication with Friends and Family: Once a week    Frequency of Social Gatherings with Friends and Family: Three times a week    Attends Religious Services: 1 to 4 times per year    Active Member of Clubs or Organizations: No    Attends Banker Meetings: Never    Marital Status: Never married  Intimate Partner Violence: Not At Risk (04/17/2024)   Humiliation, Afraid, Rape, and Kick questionnaire    Fear of Current or Ex-Partner: No    Emotionally Abused: No    Physically Abused: No    Sexually Abused: No     Allergies  Allergen Reactions   Lactose Intolerance (Gi)     Per pt report     Outpatient Medications Prior to Visit  Medication Sig Dispense Refill   apixaban  (ELIQUIS ) 5 MG TABS tablet 10 mg p.o. twice daily, and  on 10/1 switch to 5 mg p.o. twice daily     cyanocobalamin  1000 MCG tablet Take 1 tablet (1,000 mcg total) by mouth daily.     folic acid  (FOLVITE ) 1 MG tablet Take 1 tablet (1 mg total) by mouth daily. 90 tablet 0   furosemide  (LASIX ) 80 MG tablet Take 1 tablet (80 mg total) by mouth daily.     levalbuterol  (XOPENEX ) 1.25 MG/0.5ML nebulizer solution Take 1.25 mg by nebulization every 6 (six) hours as needed for wheezing or shortness of breath.     lidocaine  (LIDODERM ) 5 % Place 1 patch onto the skin daily. Remove & Discard patch within 12 hours or as directed by MD     Multiple Vitamin (MULTIVITAMIN WITH MINERALS) TABS tablet Take 1 tablet by mouth daily.     nicotine  (NICODERM CQ  - DOSED IN MG/24 HOURS) 14 mg/24hr patch Place 1 patch (14 mg total) onto the skin daily.     pantoprazole  (PROTONIX ) 40 MG tablet Take 1 tablet (40 mg total) by  mouth daily.     polyethylene glycol (MIRALAX  / GLYCOLAX ) 17 g packet Take 17 g by mouth daily as needed for mild constipation.     potassium chloride  SA (KLOR-CON  M) 20 MEQ tablet Take 1 tablet (20 mEq total) by mouth daily.     thiamine  (VITAMIN B1) 100 MG tablet Take 1 tablet (100 mg total) by mouth daily. 90 tablet 0   No facility-administered medications prior to visit.    Review of Systems  Constitutional:  Negative for chills, fever, malaise/fatigue and weight loss.  HENT:  Negative for congestion, sinus pain and sore throat.   Eyes: Negative.   Respiratory:  Negative for cough, hemoptysis, sputum production, shortness of breath and wheezing.   Cardiovascular:  Negative for chest pain, palpitations, orthopnea, claudication and leg swelling.  Gastrointestinal:  Negative for abdominal pain, heartburn, nausea and vomiting.  Genitourinary: Negative.   Musculoskeletal:  Negative for joint pain and myalgias.  Skin:  Negative for rash.  Neurological:  Negative for weakness.  Endo/Heme/Allergies: Negative.   Psychiatric/Behavioral: Negative.      Objective:   Vitals:   05/29/24 1029  BP: 116/88  Pulse: (!) 107  SpO2: 99%  Weight: 165 lb (74.8 kg)  Height: 5' 5 (1.651 m)   Physical Exam Constitutional:      General: He is not in acute distress.    Appearance: Normal appearance.  Eyes:     General: No scleral icterus.    Conjunctiva/sclera: Conjunctivae normal.  Cardiovascular:     Rate and Rhythm: Normal rate and regular rhythm.  Pulmonary:     Breath sounds: No wheezing, rhonchi or rales.  Musculoskeletal:     Right lower leg: No edema.     Left lower leg: No edema.  Skin:    General: Skin is warm and dry.  Neurological:     General: No focal deficit present.       CBC    Component Value Date/Time   WBC 9.2 05/08/2024 0948   RBC 3.80 (L) 05/08/2024 0948   HGB 12.6 (L) 05/08/2024 0948   HGB 14.8 10/13/2020 1408   HCT 36.7 (L) 05/08/2024 0948   HCT 44.6  10/13/2020 1408   PLT 529 (H) 05/08/2024 0948   PLT 314 10/13/2020 1408   MCV 96.6 05/08/2024 0948   MCV 91 10/13/2020 1408   MCH 33.2 05/08/2024 0948   MCHC 34.3 05/08/2024 0948   RDW 14.1 05/08/2024 0948   RDW 15.0  10/13/2020 1408   LYMPHSABS 2.1 04/22/2024 0445   LYMPHSABS 2.2 10/13/2020 1408   MONOABS 0.8 04/22/2024 0445   EOSABS 0.4 04/22/2024 0445   EOSABS 0.1 10/13/2020 1408   BASOSABS 0.1 04/22/2024 0445   BASOSABS 0.1 10/13/2020 1408     Chest imaging:  PFT:     No data to display          Labs:  Path:  Echo:  Heart Catheterization:       Assessment & Plan:   No diagnosis found. Assessment and Plan              Discussion: ***  Dorn Chill, MD [facility] Office: [phone]   Current Outpatient Medications:    apixaban  (ELIQUIS ) 5 MG TABS tablet, 10 mg p.o. twice daily, and on 10/1 switch to 5 mg p.o. twice daily, Disp: , Rfl:    cyanocobalamin  1000 MCG tablet, Take 1 tablet (1,000 mcg total) by mouth daily., Disp: , Rfl:    folic acid  (FOLVITE ) 1 MG tablet, Take 1 tablet (1 mg total) by mouth daily., Disp: 90 tablet, Rfl: 0   furosemide  (LASIX ) 80 MG tablet, Take 1 tablet (80 mg total) by mouth daily., Disp: , Rfl:    levalbuterol  (XOPENEX ) 1.25 MG/0.5ML nebulizer solution, Take 1.25 mg by nebulization every 6 (six) hours as needed for wheezing or shortness of breath., Disp: , Rfl:    lidocaine  (LIDODERM ) 5 %, Place 1 patch onto the skin daily. Remove & Discard patch within 12 hours or as directed by MD, Disp: , Rfl:    Multiple Vitamin (MULTIVITAMIN WITH MINERALS) TABS tablet, Take 1 tablet by mouth daily., Disp: , Rfl:    nicotine  (NICODERM CQ  - DOSED IN MG/24 HOURS) 14 mg/24hr patch, Place 1 patch (14 mg total) onto the skin daily., Disp: , Rfl:    pantoprazole  (PROTONIX ) 40 MG tablet, Take 1 tablet (40 mg total) by mouth daily., Disp: , Rfl:    polyethylene glycol (MIRALAX  / GLYCOLAX ) 17 g packet, Take 17 g  by mouth daily as needed for mild constipation., Disp: , Rfl:    potassium chloride  SA (KLOR-CON  M) 20 MEQ tablet, Take 1 tablet (20 mEq total) by mouth daily., Disp: , Rfl:    thiamine  (VITAMIN B1) 100 MG tablet, Take 1 tablet (100 mg total) by mouth daily., Disp: 90 tablet, Rfl: 0

## 2024-05-29 NOTE — Telephone Encounter (Signed)
 Copied from CRM [id number]. Topic: General - Other >> May 29, 2024 12:02 PM Essie A wrote: Reason for CRM: Patient's driver, Maryellen noticed on patient's after visit summary that the patient's weight is listed as 165 but he was weighed 05/22/24 as 105.3.  He just wanted to let the doctor know so that it can be changed.  Thanks.    I have spoken with Burnard CMA and she has updated the pts weight from his office visit today. Nothing further needed.

## 2024-05-29 NOTE — Progress Notes (Signed)
 ReDS Vest / Clip - 05/29/24 1152       ReDS Vest / Clip   Station Marker C    Ruler Value 25.5    ReDS Value Range Low volume    ReDS Actual Value 27

## 2024-05-31 ENCOUNTER — Encounter: Payer: Self-pay | Admitting: Pulmonary Disease

## 2024-06-01 LAB — RESPIRATORY CULTURE OR RESPIRATORY AND SPUTUM CULTURE
MICRO NUMBER:: 17133122
SPECIMEN QUALITY:: ADEQUATE

## 2024-06-03 ENCOUNTER — Ambulatory Visit: Payer: Self-pay | Admitting: Pulmonary Disease

## 2024-06-03 ENCOUNTER — Other Ambulatory Visit (HOSPITAL_COMMUNITY): Payer: Self-pay

## 2024-06-03 DIAGNOSIS — J15211 Pneumonia due to Methicillin susceptible Staphylococcus aureus: Secondary | ICD-10-CM

## 2024-06-03 LAB — ACID FAST CULTURE WITH REFLEXED SENSITIVITIES (MYCOBACTERIA): Acid Fast Culture: NEGATIVE

## 2024-06-03 MED ORDER — AMOXICILLIN-POT CLAVULANATE 875-125 MG PO TABS
1.0000 | ORAL_TABLET | Freq: Two times a day (BID) | ORAL | 0 refills | Status: DC
  Filled 2024-06-03: qty 28, 14d supply, fill #0

## 2024-06-03 NOTE — Telephone Encounter (Signed)
 X2 both time rang once then busy tone

## 2024-06-03 NOTE — Telephone Encounter (Signed)
 X4 rang 3 times then busy tone

## 2024-06-03 NOTE — Progress Notes (Signed)
 Please let patient know he has staph aureus in his sputum, I am sending in an antibiotic for 14 days. Prescription sent to [facility].

## 2024-06-03 NOTE — Telephone Encounter (Signed)
 X3  Tried other phone number listed rings once then busy tone.

## 2024-06-04 ENCOUNTER — Other Ambulatory Visit (HOSPITAL_COMMUNITY): Payer: Self-pay

## 2024-06-04 ENCOUNTER — Encounter: Payer: Self-pay | Admitting: Pulmonary Disease

## 2024-06-04 ENCOUNTER — Other Ambulatory Visit: Payer: Self-pay

## 2024-06-04 ENCOUNTER — Telehealth: Payer: Self-pay

## 2024-06-04 DIAGNOSIS — J15211 Pneumonia due to Methicillin susceptible Staphylococcus aureus: Secondary | ICD-10-CM

## 2024-06-04 MED ORDER — AMOXICILLIN-POT CLAVULANATE 875-125 MG PO TABS: 1.0000 | ORAL_TABLET | Freq: Two times a day (BID) | ORAL | 0 refills | Status: DC

## 2024-06-04 NOTE — Telephone Encounter (Signed)
 Reached out to where patient is currently residing [facility].   [facility] .   Service: Skilled Nursing Contact information: [address] [place]  [address] [phone]

## 2024-07-08 ENCOUNTER — Ambulatory Visit: Admitting: Pulmonary Disease

## 2024-07-26 ENCOUNTER — Other Ambulatory Visit: Payer: Self-pay

## 2024-07-26 ENCOUNTER — Emergency Department (HOSPITAL_COMMUNITY)

## 2024-07-26 ENCOUNTER — Encounter (HOSPITAL_COMMUNITY): Payer: Self-pay | Admitting: Emergency Medicine

## 2024-07-26 ENCOUNTER — Inpatient Hospital Stay (HOSPITAL_COMMUNITY)
Admission: RE | Admit: 2024-07-26 | Discharge: 2024-07-26 | Disposition: A | Discharge status: Home / Self Care | Source: Ambulatory Visit | Attending: Pulmonary Disease | Admitting: Pulmonary Disease

## 2024-07-26 ENCOUNTER — Inpatient Hospital Stay (HOSPITAL_COMMUNITY)
Admission: EM | Admit: 2024-07-26 | Discharge: 2024-08-08 | Disposition: E | Attending: Internal Medicine | Admitting: Internal Medicine

## 2024-07-26 DIAGNOSIS — Z515 Encounter for palliative care: Secondary | ICD-10-CM

## 2024-07-26 DIAGNOSIS — E875 Hyperkalemia: Secondary | ICD-10-CM | POA: Diagnosis present

## 2024-07-26 DIAGNOSIS — Z66 Do not resuscitate: Secondary | ICD-10-CM | POA: Diagnosis present

## 2024-07-26 DIAGNOSIS — E8809 Other disorders of plasma-protein metabolism, not elsewhere classified: Secondary | ICD-10-CM | POA: Diagnosis present

## 2024-07-26 DIAGNOSIS — I5022 Chronic systolic (congestive) heart failure: Secondary | ICD-10-CM | POA: Diagnosis present

## 2024-07-26 DIAGNOSIS — R55 Syncope and collapse: Principal | ICD-10-CM

## 2024-07-26 DIAGNOSIS — Z7901 Long term (current) use of anticoagulants: Secondary | ICD-10-CM

## 2024-07-26 DIAGNOSIS — I110 Hypertensive heart disease with heart failure: Secondary | ICD-10-CM | POA: Diagnosis present

## 2024-07-26 DIAGNOSIS — E119 Type 2 diabetes mellitus without complications: Secondary | ICD-10-CM | POA: Diagnosis present

## 2024-07-26 DIAGNOSIS — K7030 Alcoholic cirrhosis of liver without ascites: Principal | ICD-10-CM | POA: Diagnosis present

## 2024-07-26 DIAGNOSIS — F17210 Nicotine dependence, cigarettes, uncomplicated: Secondary | ICD-10-CM | POA: Diagnosis present

## 2024-07-26 DIAGNOSIS — K920 Hematemesis: Secondary | ICD-10-CM | POA: Diagnosis not present

## 2024-07-26 DIAGNOSIS — I8511 Secondary esophageal varices with bleeding: Secondary | ICD-10-CM | POA: Diagnosis present

## 2024-07-26 DIAGNOSIS — I426 Alcoholic cardiomyopathy: Secondary | ICD-10-CM | POA: Diagnosis present

## 2024-07-26 DIAGNOSIS — J984 Other disorders of lung: Secondary | ICD-10-CM

## 2024-07-26 DIAGNOSIS — Z86718 Personal history of other venous thrombosis and embolism: Secondary | ICD-10-CM

## 2024-07-26 DIAGNOSIS — J9601 Acute respiratory failure with hypoxia: Secondary | ICD-10-CM | POA: Diagnosis present

## 2024-07-26 DIAGNOSIS — E43 Unspecified severe protein-calorie malnutrition: Secondary | ICD-10-CM | POA: Diagnosis present

## 2024-07-26 DIAGNOSIS — K7460 Unspecified cirrhosis of liver: Secondary | ICD-10-CM

## 2024-07-26 DIAGNOSIS — D684 Acquired coagulation factor deficiency: Secondary | ICD-10-CM | POA: Diagnosis present

## 2024-07-26 DIAGNOSIS — I82403 Acute embolism and thrombosis of unspecified deep veins of lower extremity, bilateral: Secondary | ICD-10-CM | POA: Diagnosis present

## 2024-07-26 DIAGNOSIS — Z903 Acquired absence of stomach [part of]: Secondary | ICD-10-CM

## 2024-07-26 DIAGNOSIS — R64 Cachexia: Secondary | ICD-10-CM | POA: Diagnosis present

## 2024-07-26 DIAGNOSIS — Z681 Body mass index (BMI) 19 or less, adult: Secondary | ICD-10-CM

## 2024-07-26 DIAGNOSIS — Z5900 Homelessness unspecified: Secondary | ICD-10-CM | POA: Diagnosis not present

## 2024-07-26 DIAGNOSIS — Z59819 Housing instability, housed unspecified: Secondary | ICD-10-CM

## 2024-07-26 DIAGNOSIS — R578 Other shock: Principal | ICD-10-CM | POA: Diagnosis present

## 2024-07-26 DIAGNOSIS — Z79899 Other long term (current) drug therapy: Secondary | ICD-10-CM

## 2024-07-26 DIAGNOSIS — E869 Volume depletion, unspecified: Secondary | ICD-10-CM | POA: Diagnosis present

## 2024-07-26 DIAGNOSIS — K922 Gastrointestinal hemorrhage, unspecified: Secondary | ICD-10-CM

## 2024-07-26 DIAGNOSIS — Z8701 Personal history of pneumonia (recurrent): Secondary | ICD-10-CM

## 2024-07-26 DIAGNOSIS — E871 Hypo-osmolality and hyponatremia: Secondary | ICD-10-CM | POA: Diagnosis present

## 2024-07-26 DIAGNOSIS — J449 Chronic obstructive pulmonary disease, unspecified: Secondary | ICD-10-CM | POA: Diagnosis present

## 2024-07-26 DIAGNOSIS — I825Y3 Chronic embolism and thrombosis of unspecified deep veins of proximal lower extremity, bilateral: Secondary | ICD-10-CM | POA: Diagnosis not present

## 2024-07-26 DIAGNOSIS — E872 Acidosis, unspecified: Secondary | ICD-10-CM | POA: Diagnosis present

## 2024-07-26 DIAGNOSIS — D62 Acute posthemorrhagic anemia: Secondary | ICD-10-CM | POA: Diagnosis present

## 2024-07-26 DIAGNOSIS — K828 Other specified diseases of gallbladder: Secondary | ICD-10-CM | POA: Diagnosis present

## 2024-07-26 DIAGNOSIS — Z5948 Other specified lack of adequate food: Secondary | ICD-10-CM

## 2024-07-26 DIAGNOSIS — Z5941 Food insecurity: Secondary | ICD-10-CM

## 2024-07-26 DIAGNOSIS — N179 Acute kidney failure, unspecified: Secondary | ICD-10-CM | POA: Diagnosis present

## 2024-07-26 DIAGNOSIS — Z5982 Transportation insecurity: Secondary | ICD-10-CM

## 2024-07-26 DIAGNOSIS — Z8711 Personal history of peptic ulcer disease: Secondary | ICD-10-CM

## 2024-07-26 DIAGNOSIS — R627 Adult failure to thrive: Secondary | ICD-10-CM | POA: Diagnosis present

## 2024-07-26 DIAGNOSIS — F1020 Alcohol dependence, uncomplicated: Secondary | ICD-10-CM | POA: Diagnosis present

## 2024-07-26 DIAGNOSIS — K219 Gastro-esophageal reflux disease without esophagitis: Secondary | ICD-10-CM | POA: Diagnosis present

## 2024-07-26 LAB — URINALYSIS, W/ REFLEX TO CULTURE (INFECTION SUSPECTED)
Bacteria, UA: NONE SEEN
Bilirubin Urine: NEGATIVE
Glucose, UA: NEGATIVE mg/dL
Ketones, ur: NEGATIVE mg/dL
Leukocytes,Ua: NEGATIVE
Nitrite: NEGATIVE
Protein, ur: NEGATIVE mg/dL
Specific Gravity, Urine: 1.006 (ref 1.005–1.030)
pH: 5 (ref 5.0–8.0)

## 2024-07-26 LAB — CBC WITH DIFFERENTIAL/PLATELET
Abs Immature Granulocytes: 0.1 K/uL — ABNORMAL HIGH (ref 0.00–0.07)
Basophils Absolute: 0 K/uL (ref 0.0–0.1)
Basophils Relative: 0 %
Eosinophils Absolute: 0 K/uL (ref 0.0–0.5)
Eosinophils Relative: 0 %
HCT: 24.4 % — ABNORMAL LOW (ref 39.0–52.0)
Hemoglobin: 7.7 g/dL — ABNORMAL LOW (ref 13.0–17.0)
Immature Granulocytes: 1 %
Lymphocytes Relative: 7 %
Lymphs Abs: 0.9 K/uL (ref 0.7–4.0)
MCH: 30.3 pg (ref 26.0–34.0)
MCHC: 31.6 g/dL (ref 30.0–36.0)
MCV: 96.1 fL (ref 80.0–100.0)
Monocytes Absolute: 0.7 K/uL (ref 0.1–1.0)
Monocytes Relative: 6 %
Neutro Abs: 11 K/uL — ABNORMAL HIGH (ref 1.7–7.7)
Neutrophils Relative %: 86 %
Platelets: 457 K/uL — ABNORMAL HIGH (ref 150–400)
RBC: 2.54 MIL/uL — ABNORMAL LOW (ref 4.22–5.81)
RDW: 15.2 % (ref 11.5–15.5)
WBC: 12.7 K/uL — ABNORMAL HIGH (ref 4.0–10.5)
nRBC: 0 % (ref 0.0–0.2)

## 2024-07-26 LAB — COMPREHENSIVE METABOLIC PANEL WITH GFR
ALT: <5 U/L (ref 0–44)
AST: 13 U/L — ABNORMAL LOW (ref 15–41)
Albumin: 2.4 g/dL — ABNORMAL LOW (ref 3.5–5.0)
Alkaline Phosphatase: 126 U/L (ref 38–126)
Anion gap: 21 — ABNORMAL HIGH (ref 5–15)
BUN: 40 mg/dL — ABNORMAL HIGH (ref 8–23)
CO2: 17 mmol/L — ABNORMAL LOW (ref 22–32)
Calcium: 8.8 mg/dL — ABNORMAL LOW (ref 8.9–10.3)
Chloride: 90 mmol/L — ABNORMAL LOW (ref 98–111)
Creatinine, Ser: 1.58 mg/dL — ABNORMAL HIGH (ref 0.61–1.24)
GFR, Estimated: 48 mL/min — ABNORMAL LOW
Glucose, Bld: 165 mg/dL — ABNORMAL HIGH (ref 70–99)
Potassium: 5.6 mmol/L — ABNORMAL HIGH (ref 3.5–5.1)
Sodium: 128 mmol/L — ABNORMAL LOW (ref 135–145)
Total Bilirubin: 0.8 mg/dL (ref 0.0–1.2)
Total Protein: 7.5 g/dL (ref 6.5–8.1)

## 2024-07-26 LAB — I-STAT CHEM 8, ED
BUN: 43 mg/dL — ABNORMAL HIGH (ref 8–23)
Calcium, Ion: 1.02 mmol/L — ABNORMAL LOW (ref 1.15–1.40)
Chloride: 95 mmol/L — ABNORMAL LOW (ref 98–111)
Creatinine, Ser: 1.9 mg/dL — ABNORMAL HIGH (ref 0.61–1.24)
Glucose, Bld: 151 mg/dL — ABNORMAL HIGH (ref 70–99)
HCT: 28 % — ABNORMAL LOW (ref 39.0–52.0)
Hemoglobin: 9.5 g/dL — ABNORMAL LOW (ref 13.0–17.0)
Potassium: 6.2 mmol/L — ABNORMAL HIGH (ref 3.5–5.1)
Sodium: 127 mmol/L — ABNORMAL LOW (ref 135–145)
TCO2: 21 mmol/L — ABNORMAL LOW (ref 22–32)

## 2024-07-26 LAB — RESP PANEL BY RT-PCR (RSV, FLU A&B, COVID)  RVPGX2
Influenza A by PCR: NEGATIVE
Influenza B by PCR: NEGATIVE
Resp Syncytial Virus by PCR: NEGATIVE
SARS Coronavirus 2 by RT PCR: NEGATIVE

## 2024-07-26 LAB — PROTIME-INR
INR: 3.8 — ABNORMAL HIGH (ref 0.8–1.2)
Prothrombin Time: 38.9 s — ABNORMAL HIGH (ref 11.4–15.2)

## 2024-07-26 LAB — LACTIC ACID, PLASMA: Lactic Acid, Venous: 7.4 mmol/L (ref 0.5–1.9)

## 2024-07-26 LAB — CBG MONITORING, ED: Glucose-Capillary: 146 mg/dL — ABNORMAL HIGH (ref 70–99)

## 2024-07-26 LAB — ABO/RH: ABO/RH(D): O POS

## 2024-07-26 LAB — TROPONIN T, HIGH SENSITIVITY: Troponin T High Sensitivity: 51 ng/L — ABNORMAL HIGH (ref 0–19)

## 2024-07-26 MED ORDER — SODIUM CHLORIDE 0.9 % IV SOLN
50.0000 ug/h | INTRAVENOUS | Status: DC
  Administered 2024-07-26 – 2024-07-28 (×3): 50 ug/h via INTRAVENOUS
  Filled 2024-07-26 (×11): qty 1

## 2024-07-26 MED ORDER — VANCOMYCIN HCL IN DEXTROSE 1-5 GM/200ML-% IV SOLN
1000.0000 mg | Freq: Once | INTRAVENOUS | Status: AC
  Administered 2024-07-26: 1000 mg via INTRAVENOUS
  Filled 2024-07-26: qty 200

## 2024-07-26 MED ORDER — PANTOPRAZOLE SODIUM 40 MG IV SOLR
40.0000 mg | Freq: Two times a day (BID) | INTRAVENOUS | Status: DC
  Administered 2024-07-27 – 2024-07-28 (×3): 40 mg via INTRAVENOUS
  Filled 2024-07-26 (×3): qty 10

## 2024-07-26 MED ORDER — INSULIN ASPART 100 UNIT/ML IV SOLN
10.0000 [IU] | Freq: Once | INTRAVENOUS | Status: AC
  Administered 2024-07-26: 10 [IU] via INTRAVENOUS
  Filled 2024-07-26: qty 1

## 2024-07-26 MED ORDER — SODIUM CHLORIDE 0.9 % IV SOLN
2.0000 g | Freq: Once | INTRAVENOUS | Status: AC
  Administered 2024-07-26: 2 g via INTRAVENOUS
  Filled 2024-07-26: qty 20

## 2024-07-26 MED ORDER — NICOTINE 21 MG/24HR TD PT24
21.0000 mg | MEDICATED_PATCH | Freq: Every day | TRANSDERMAL | Status: DC
  Administered 2024-07-26 – 2024-07-28 (×3): 21 mg via TRANSDERMAL
  Filled 2024-07-26 (×3): qty 1

## 2024-07-26 MED ORDER — PROTHROMBIN COMPLEX CONC HUMAN 500 UNITS IV KIT
2143.0000 [IU] | PACK | Status: AC
  Administered 2024-07-26: 2143 [IU] via INTRAVENOUS
  Filled 2024-07-26: qty 2143

## 2024-07-26 MED ORDER — SODIUM CHLORIDE 0.9 % IV BOLUS
2000.0000 mL | Freq: Once | INTRAVENOUS | Status: AC
  Administered 2024-07-26: 1000 mL via INTRAVENOUS

## 2024-07-26 MED ORDER — PANTOPRAZOLE SODIUM 40 MG IV SOLR
80.0000 mg | Freq: Once | INTRAVENOUS | Status: AC
  Administered 2024-07-26: 80 mg via INTRAVENOUS
  Filled 2024-07-26: qty 20

## 2024-07-26 MED ORDER — SODIUM BICARBONATE 8.4 % IV SOLN
50.0000 meq | Freq: Once | INTRAVENOUS | Status: AC
  Administered 2024-07-26: 50 meq via INTRAVENOUS
  Filled 2024-07-26: qty 50

## 2024-07-26 MED ORDER — SODIUM CHLORIDE 0.9% IV SOLUTION: Freq: Once | INTRAVENOUS | Status: AC

## 2024-07-26 MED ORDER — DEXTROSE 50 % IV SOLN
1.0000 | Freq: Once | INTRAVENOUS | Status: AC
  Administered 2024-07-26: 50 mL via INTRAVENOUS
  Filled 2024-07-26: qty 50

## 2024-07-26 MED ORDER — VITAMIN K1 10 MG/ML IJ SOLN
10.0000 mg | Freq: Once | INTRAVENOUS | Status: AC
  Administered 2024-07-26: 10 mg via INTRAVENOUS
  Filled 2024-07-26: qty 1

## 2024-07-26 MED ORDER — ALBUMIN HUMAN 25 % IV SOLN
50.0000 g | Freq: Once | INTRAVENOUS | Status: AC
  Administered 2024-07-26: 50 g via INTRAVENOUS
  Filled 2024-07-26: qty 200

## 2024-07-26 MED ORDER — SODIUM CHLORIDE 0.9 % IV BOLUS
1000.0000 mL | Freq: Once | INTRAVENOUS | Status: AC
  Administered 2024-07-26: 1000 mL via INTRAVENOUS

## 2024-07-26 MED ORDER — CALCIUM GLUCONATE-NACL 1-0.675 GM/50ML-% IV SOLN
1.0000 g | Freq: Once | INTRAVENOUS | Status: AC
  Administered 2024-07-26: 1000 mg via INTRAVENOUS
  Filled 2024-07-26: qty 50

## 2024-07-26 MED ORDER — SODIUM CHLORIDE 0.9 % IV SOLN
1.0000 g | Freq: Two times a day (BID) | INTRAVENOUS | Status: DC
  Administered 2024-07-26 – 2024-07-28 (×4): 1 g via INTRAVENOUS
  Filled 2024-07-26 (×4): qty 20

## 2024-07-26 MED ORDER — SODIUM ZIRCONIUM CYCLOSILICATE 10 G PO PACK
10.0000 g | PACK | Freq: Once | ORAL | Status: DC
  Filled 2024-07-26: qty 2

## 2024-07-26 MED ORDER — SODIUM CHLORIDE 0.9 % IV SOLN
500.0000 mg | Freq: Once | INTRAVENOUS | Status: AC
  Administered 2024-07-26: 500 mg via INTRAVENOUS
  Filled 2024-07-26: qty 5

## 2024-07-26 MED ORDER — ALBUTEROL SULFATE (2.5 MG/3ML) 0.083% IN NEBU
10.0000 mg | INHALATION_SOLUTION | Freq: Once | RESPIRATORY_TRACT | Status: AC
  Administered 2024-07-26: 10 mg via RESPIRATORY_TRACT
  Filled 2024-07-26: qty 12

## 2024-07-26 MED ORDER — PANTOPRAZOLE SODIUM 40 MG IV SOLR: 40.0000 mg | Freq: Two times a day (BID) | INTRAVENOUS | Status: DC

## 2024-07-26 MED ORDER — CHLORHEXIDINE GLUCONATE CLOTH 2 % EX PADS
6.0000 | MEDICATED_PAD | Freq: Every day | CUTANEOUS | Status: DC
  Administered 2024-07-27 – 2024-07-28 (×2): 6 via TOPICAL

## 2024-07-26 NOTE — Progress Notes (Signed)
 Two unsuccessful attempts were made for an ABG. Dr. Zammit said we could postpone until patient  had additional IV fluids.

## 2024-07-26 NOTE — Consult Note (Signed)
 " Consulting  Provider: Dr. Sherlon Primary Care Physician:  [name], MD Primary Gastroenterologist: Previously unassigned  Reason for Consultation: Upper GI bleed  HPI:  Cody Glenn is a 68 y.o. 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  Found to be hypotensive initially responded to IV fluids.  Transported to the ER where he had episode of coffee-ground emesis.  Blood work with hemoglobin 7.7, was 12.6 two months ago.  Initial potassium 6.2, 5.6 on recheck, creatinine 1.9, albumin  2.4, AST 13, ALT 5, INR 3.8, lactic acid 7.4.  Sepsis protocol initiated.  Has been started on meropenem , vancomycin .  Has also been started on octreotide , received 1 dose of IV pantoprazole  as well as 50 g of albumin .  In regards to his diagnosis of cirrhosis, he had an ultrasound 02/20/2024 that showed nodular contour of the liver, no hepatoma.  Gallbladder sludge no acute cholecystitis.  Patient is homeless for the most part, does not see gastroenterology/hepatology for his cirrhosis.    Past Medical History:  Diagnosis Date   Alcohol  use    Anemia    Cavitary lung disease    Cirrhosis (HCC)    Homelessness    Hyperbilirubinemia 07/01/2020   Peptic ulcer 08/09/1987   Jul 28, 2008 Entered By: KATHERINE ETHA HERO Comment: s/p partial gastrectomy   Thoracic ascending aortic aneurysm    Thrombocytopenia    Tobacco abuse     Past Surgical History:  Procedure Laterality Date   FLEXIBLE BRONCHOSCOPY Left 02/21/2024   Procedure: BRONCHOSCOPY, FLEXIBLE;  Surgeon: Shelah Lamar RAMAN, MD;  Location: [facility] ENDOSCOPY;  Service: Cardiopulmonary;  Laterality: Left;   FLEXIBLE BRONCHOSCOPY Bilateral 04/19/2024    Procedure: BRONCHOSCOPY, FLEXIBLE;  Surgeon: Kara Dorn NOVAK, MD;  Location: [facility] ENDOSCOPY;  Service: Pulmonary;  Laterality: Bilateral;   ulcer surgery in stomach     pt states he had this approx 33 years ago    Prior to Admission medications  Medication Sig Start Date End Date Taking? Authorizing Provider  amoxicillin -clavulanate (AUGMENTIN ) 875-125 MG tablet Take 1 tablet by mouth 2 (two) times daily. 06/04/24   Kara Dorn NOVAK, MD  apixaban  (ELIQUIS ) 5 MG TABS tablet 10 mg p.o. twice daily, and on 10/1 switch to 5 mg p.o. twice daily 05/02/24   Ghimire, Donalda HERO, MD  cyanocobalamin  1000 MCG tablet Take 1 tablet (1,000 mcg total) by mouth daily. 05/03/24   Ghimire, Donalda HERO, MD  folic acid  (FOLVITE ) 1 MG tablet Take 1 tablet (1 mg total) by mouth daily. 02/28/24   Lue Elsie BROCKS, MD  furosemide  (LASIX ) 80 MG tablet Take 1 tablet (80 mg total) by mouth daily. 05/03/24   Ghimire, Donalda HERO, MD  levalbuterol  (XOPENEX ) 1.25 MG/0.5ML nebulizer solution Take 1.25 mg by nebulization every 6 (six) hours as needed for wheezing or shortness of breath. 05/02/24   Ghimire, Donalda HERO, MD  lidocaine  (LIDODERM ) 5 % Place 1 patch onto the skin daily. Remove & Discard patch within 12 hours or as directed by MD 05/03/24   Raenelle Donalda HERO, MD  Multiple Vitamin (MULTIVITAMIN WITH MINERALS) TABS tablet Take 1 tablet by mouth daily. 05/03/24   Ghimire, Donalda HERO, MD  nicotine  (NICODERM CQ  - DOSED IN MG/24 HOURS) 14 mg/24hr patch Place  1 patch (14 mg total) onto the skin daily. 05/03/24   Ghimire, Donalda HERO, MD  pantoprazole  (PROTONIX ) 40 MG tablet Take 1 tablet (40 mg total) by mouth daily. 05/03/24   Ghimire, Donalda HERO, MD  polyethylene glycol (MIRALAX  / GLYCOLAX ) 17 g packet Take 17 g by mouth daily as needed for mild constipation. 05/02/24   Ghimire, Donalda HERO, MD  potassium chloride  SA (KLOR-CON  M) 20 MEQ tablet Take 1 tablet (20 mEq total) by mouth daily. 05/03/24   Ghimire, Donalda HERO, MD  thiamine   (VITAMIN B1) 100 MG tablet Take 1 tablet (100 mg total) by mouth daily. 02/28/24   Lue Elsie BROCKS, MD    Current Facility-Administered Medications  Medication Dose Route Frequency Provider Last Rate Last Admin   albumin  human 25 % solution 50 g  50 g Intravenous Once Elgergawy, Dawood S, MD       calcium  gluconate 1 g/ 50 mL sodium chloride  IVPB  1 g Intravenous Once Elgergawy, Dawood S, MD       meropenem  (MERREM ) 1 g in sodium chloride  0.9 % 100 mL IVPB  1 g Intravenous Q12H Elgergawy, Dawood S, MD       octreotide  (SANDOSTATIN ) 500 mcg in sodium chloride  0.9 % 250 mL (2 mcg/mL) infusion  50 mcg/hr Intravenous Continuous Elgergawy, Brayton RAMAN, MD 25 mL/hr at 07/26/24 1711 50 mcg/hr at 07/26/24 1711   phytonadione  (VITAMIN K ) 10 mg in dextrose  5 % 50 mL IVPB  10 mg Intravenous Once Elgergawy, Dawood S, MD 51 mL/hr at 07/26/24 1803 10 mg at 07/26/24 1803   sodium zirconium cyclosilicate  (LOKELMA ) packet 10 g  10 g Oral Once Elgergawy, Dawood S, MD       vancomycin  (VANCOCIN ) IVPB 1000 mg/200 mL premix  1,000 mg Intravenous Once Suzette Pac, MD       Current Outpatient Medications  Medication Sig Dispense Refill   amoxicillin -clavulanate (AUGMENTIN ) 875-125 MG tablet Take 1 tablet by mouth 2 (two) times daily. 28 tablet 0   apixaban  (ELIQUIS ) 5 MG TABS tablet 10 mg p.o. twice daily, and on 10/1 switch to 5 mg p.o. twice daily     cyanocobalamin  1000 MCG tablet Take 1 tablet (1,000 mcg total) by mouth daily.     folic acid  (FOLVITE ) 1 MG tablet Take 1 tablet (1 mg total) by mouth daily. 90 tablet 0   furosemide  (LASIX ) 80 MG tablet Take 1 tablet (80 mg total) by mouth daily.     levalbuterol  (XOPENEX ) 1.25 MG/0.5ML nebulizer solution Take 1.25 mg by nebulization every 6 (six) hours as needed for wheezing or shortness of breath.     lidocaine  (LIDODERM ) 5 % Place 1 patch onto the skin daily. Remove & Discard patch within 12 hours or as directed by MD     Multiple Vitamin (MULTIVITAMIN WITH  MINERALS) TABS tablet Take 1 tablet by mouth daily.     nicotine  (NICODERM CQ  - DOSED IN MG/24 HOURS) 14 mg/24hr patch Place 1 patch (14 mg total) onto the skin daily.     pantoprazole  (PROTONIX ) 40 MG tablet Take 1 tablet (40 mg total) by mouth daily.     polyethylene glycol (MIRALAX  / GLYCOLAX ) 17 g packet Take 17 g by mouth daily as needed for mild constipation.     potassium chloride  SA (KLOR-CON  M) 20 MEQ tablet Take 1 tablet (20 mEq total) by mouth daily.     thiamine  (VITAMIN B1) 100 MG tablet Take 1 tablet (100 mg total) by mouth daily. 90  tablet 0    Allergies as of 07/26/2024 - Review Complete 07/26/2024  Allergen Reaction Noted   Lactose intolerance (gi)  04/17/2024    Family History  Family history unknown: Yes    Social History   Socioeconomic History   Marital status: Single    Spouse name: Not on file   Number of children: 0   Years of education: Not on file   Highest education level: High school graduate  Occupational History   Occupation: unemployed  Tobacco Use   Smoking status: Every Day    Current packs/day: 1.00    Average packs/day: 1 pack/day for 40.0 years (40.0 ttl pk-yrs)    Types: Cigarettes   Smokeless tobacco: Never  Vaping Use   Vaping status: Never Used  Substance and Sexual Activity   Alcohol  use: Yes    Alcohol /week: 12.0 standard drinks of alcohol     Types: 12 Cans of beer per week    Comment: daily 12- 24 beers per notes   Drug use: Not Currently   Sexual activity: Not on file  Other Topics Concern   Not on file  Social History Narrative   Not on file   Social Drivers of Health   Tobacco Use: High Risk (07/26/2024)   Patient History    Smoking Tobacco Use: Every Day    Smokeless Tobacco Use: Never    Passive Exposure: Not on file  Financial Resource Strain: Low Risk (04/30/2024)   Overall Financial Resource Strain (CARDIA)    Difficulty of Paying Living Expenses: Not very hard  Food Insecurity: Food Insecurity Present  (04/17/2024)   Epic    Worried About Programme Researcher, Broadcasting/film/video in the Last Year: Often true    Ran Out of Food in the Last Year: Often true  Transportation Needs: Unmet Transportation Needs (04/30/2024)   Epic    Lack of Transportation (Medical): Yes    Lack of Transportation (Non-Medical): Yes  Physical Activity: Not on file  Stress: Not on file  Social Connections: Moderately Isolated (04/17/2024)   Social Connection and Isolation Panel    Frequency of Communication with Friends and Family: Once a week    Frequency of Social Gatherings with Friends and Family: Three times a week    Attends Religious Services: 1 to 4 times per year    Active Member of Clubs or Organizations: No    Attends Banker Meetings: Never    Marital Status: Never married  Intimate Partner Violence: Not At Risk (04/17/2024)   Epic    Fear of Current or Ex-Partner: No    Emotionally Abused: No    Physically Abused: No    Sexually Abused: No  Depression (PHQ2-9): Not on file  Alcohol  Screen: High Risk (04/30/2024)   Alcohol  Screen    Last Alcohol  Screening Score (AUDIT): 27  Housing: High Risk (04/30/2024)   Epic    Unable to Pay for Housing in the Last Year: No    Number of Times Moved in the Last Year: Not on file    Homeless in the Last Year: Yes  Utilities: Not At Risk (04/17/2024)   Epic    Threatened with loss of utilities: No  Health Literacy: Not on file    Review of Systems: General: Negative for anorexia, weight loss, fever, chills, fatigue, weakness. Eyes: Negative for vision changes.  ENT: Negative for hoarseness, difficulty swallowing , nasal congestion. CV: Negative for chest pain, angina, palpitations, dyspnea on exertion, peripheral edema.  Respiratory: Negative for  dyspnea at rest, dyspnea on exertion, cough, sputum, wheezing.  GI: See history of present illness. GU:  Negative for dysuria, hematuria, urinary incontinence, urinary frequency, nocturnal urination.  MS: Negative for  joint pain, low back pain.  Derm: Negative for rash or itching.  Neuro: Negative for weakness, abnormal sensation, seizure, frequent headaches, memory loss, confusion.  Psych: Negative for anxiety, depression Endo: Negative for unusual weight change.  Heme: Negative for bruising or bleeding. Allergy: Negative for rash or hives.  Physical Exam: Vital signs in last 24 hours: Temp:  [98.4 F (36.9 C)-98.6 F (37 C)] 98.4 F (36.9 C) (12/19 1716) Pulse Rate:  [103-108] 108 (12/19 1716) Resp:  [12-29] 20 (12/19 1716) BP: (74-144)/(40-92) 139/74 (12/19 1716) SpO2:  [90 %-91 %] 91 % (12/19 1716) Weight:  [48 kg] 48 kg (12/19 1400)   General:   Alert, malnourished, chronically ill-appearing Head:  Normocephalic and atraumatic. Eyes:  Sclera clear, no icterus.   Conjunctiva pink. Ears:  Normal auditory acuity. Nose:  No deformity, discharge,  or lesions. Mouth:  No deformity or lesions, poor dentition. Neck:  Supple; no masses or thyromegaly. Lungs:  Clear throughout to auscultation.   No wheezes, crackles, or rhonchi. No acute distress. Heart: Tachycardic, normal rhythm; no murmurs, clicks, rubs,  or gallops. Abdomen:  Soft, nontender and nondistended. No masses, hepatosplenomegaly or hernias noted. Normal bowel sounds, without guarding, and without rebound.   Msk:  Symmetrical without gross deformities. Normal posture. Pulses:  Normal pulses noted. Extremities:  Without clubbing or edema. Neurologic:  Alert and  oriented x4;  grossly normal neurologically. Skin:  Intact without significant lesions or rashes. Cervical Nodes:  No significant cervical adenopathy. Psych:  Alert and cooperative. Normal mood and affect.  Intake/Output from previous day: No intake/output data recorded. Intake/Output this shift: Total I/O In: 283.5 [IV Piggyback:283.5] Out: -   Lab Results: Recent Labs    07/26/24 1406 07/26/24 1432  WBC  --  12.7*  HGB 9.5* 7.7*  HCT 28.0* 24.4*  PLT  --  457*    BMET Recent Labs    07/26/24 1406 07/26/24 1432  NA 127* 128*  K 6.2* 5.6*  CL 95* 90*  CO2  --  17*  GLUCOSE 151* 165*  BUN 43* 40*  CREATININE 1.90* 1.58*  CALCIUM   --  8.8*   LFT Recent Labs    07/26/24 1432  PROT 7.5  ALBUMIN  2.4*  AST 13*  ALT <5  ALKPHOS 126  BILITOT 0.8   PT/INR Recent Labs    07/26/24 1432  LABPROT 38.9*  INR 3.8*   Hepatitis Panel No results for input(s): HEPBSAG, HCVAB, HEPAIGM, HEPBIGM in the last 72 hours. C-Diff No results for input(s): CDIFFTOX in the last 72 hours.  Studies/Results: DG Chest Port 1 View Result Date: 07/26/2024 CLINICAL DATA:  Sepsis evaluation. EXAM: PORTABLE CHEST 1 VIEW COMPARISON:  April 29, 2024 FINDINGS: The heart size and mediastinal contours are within normal limits. A large thin walled cavitary lesion is seen within the left upper lobe. This is mildly increased in size when compared to the prior study. A large thick walled cavitary lesion is also noted within the mid right lung. This represents a new finding when compared to the prior study. No pleural effusion or pneumothorax is identified. Surgical clips are seen overlying the expected region of the esophageal hiatus. The visualized skeletal structures are unremarkable. IMPRESSION: 1. Large bilateral cavitary lesions which may represent sequelae associated with an atypical infection. Correlation with findings  on the most recent chest CT is recommended (July 26, 2024). Electronically Signed   By: Suzen Dials M.D.   On: 07/26/2024 16:11   CT Chest Wo Contrast Result Date: 07/26/2024 CLINICAL DATA:  Follow-up left upper lobe cavitary lesion. Decreased level of consciousness and fixed stare today. EXAM: CT CHEST WITHOUT CONTRAST TECHNIQUE: Multidetector CT imaging of the chest was performed following the standard protocol without IV contrast. RADIATION DOSE REDUCTION: This exam was performed according to the departmental dose-optimization  program which includes automated exposure control, adjustment of the mA and/or kV according to patient size and/or use of iterative reconstruction technique. COMPARISON:  04/28/2024 FINDINGS: Cardiovascular: Normal-sized heart. No pericardial effusion. Atheromatous calcifications, including the coronary arteries and aorta. Diffuse low density of the blood relative to the arterial walls. Mediastinum/Nodes: Borderline enlarged mediastinal lymph nodes with improvement. These include a precarinal node with a short axis diameter of 8.6 mm on image number 127/10, previously 9.5 mm. An AP window lymph node has a short axis diameter of 8.2 mm on image number 122/10, previously 12.6 mm. Unremarkable thyroid  gland and trachea. Interval mildly dilated esophagus containing some fluid. Lungs/Pleura: The previously demonstrated 5.8 x 3.9 cm cavitary lesion in the left upper lobe has increased in size. This currently measures 7.2 x 6.9 cm on image number 31/4, including a new communicating anterior component and a larger inferior component. This continues to contain some fluid. There is also a new cavitary lesion more inferiorly in the left upper lobe containing some fluid and moderately and mildly thickened walls. This measures 4.7 x 2.6 cm on image number 71/4. Also demonstrated is a new cavitary lesion with air-fluid levels and adjacent airspace consolidation in the right middle lobe and extending through the minor fissure into the right upper lobe. This measures 10.8 x 7.4 cm on image number 91/4. Bilateral centrilobular and paraseptal emphysema is noted. Small right pleural effusion and minimal left pleural effusion, both significantly decreased. Upper Abdomen: Dense atheromatous arterial calcifications without aneurysm. 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 IMPRESSION: 1. Interval enlargement of the previously demonstrated cavitary lesion  in the left upper lobe with a new communicating anterior component and a larger inferior component. 2. New cavitary lesion with air-fluid levels and adjacent airspace consolidation in the right middle lobe and extending through the minor fissure into the right upper lobe. 3. New cavitary lesion more inferiorly in the left upper lobe containing some fluid and moderately and mildly thickened walls. 4. Differential considerations for these findings include multifocal necrotizing pneumonia, necrotizing aspergillosis, tuberculosis, atypical mycobacterial infection and granulomatosis with polyangiitis. 5. Small right pleural effusion and minimal left pleural effusion, both significantly decreased. 6. Interval mildly dilated esophagus containing some fluid. This could be due to reflux or dysmotility. 7. Diffuse low density of the blood relative to the arterial walls, compatible with anemia. 8. Calcific coronary artery and aortic atherosclerosis. Aortic Atherosclerosis (ICD10-I70.0) and Emphysema (ICD10-J43.9). Electronically Signed   By: Elspeth Bathe M.D.   On: 07/26/2024 14:37    Assessment/Plan:  1.  Upper GI bleed, hematemesis-discussed in depth with patient as well as with anesthesiologist and hospitalist.  Given patient's EF of 10%, respiratory failure in the setting of COPD/cavitary lesions, hyperkalemia, felt to be too high risk for endoscopic procedures.  Patient with high probability of cardiac death with anesthesia.  Patient is DNR/DNI as well.  Would recommend conservative measures.  Does have underlying cirrhosis, assume variceal bleed  until proven otherwise.  Recommend Kcentra  to reverse Eliquis  effects.  Recommend IV PPI twice daily.  Recommend octreotide  gtt  Patient on antibiotics which will cross cover for SBP prophylaxis.  2.  Hemorrhagic shock, syncope-due to above.  Continue to monitor H&H and transfuse for less than 7 or hemodynamic instability.  Continue to monitor lactic  acid.  Prognosis is grim.  Consider palliative involvement.  GI to continue to follow.   Carlin POUR. Cindie, D.O. Gastroenterology and Hepatology [facility]    LOS: 0 days     07/26/2024, 6:12 PM    "

## 2024-07-26 NOTE — ED Notes (Signed)
 Pt is not wanting to give blood for RN or lab. Several attempts for blood cultures.

## 2024-07-26 NOTE — H&P (Signed)
 "                                                                                                          TRH H&P   Patient Demographics:    Cody Glenn, is a 68 y.o. male  MRN: [id number]   DOB - 1956-08-01  Admit Date - 07/26/2024  Outpatient Primary MD for the patient is [name], MD    Chief Complaint  Patient presents with   Loss of Consciousness      HPI:    Cody Glenn  is a 68 y.o. male, with history of EtOH use, known lung cavitary lesion, HTN, homelessness, liver cirrhosis, recent hospitalization at [facility] for actinobacter pneumonia, status post bronchoscopy, diagnosis of systolic CHF EF 10%, liver cirrhosis, DVT on Eliquis , and COPD, patient was discharged to SNF. - Patient presented from SNF, for routine CT chest, apparently he had a syncopal event after his CT was done, blood pressure was low so he was sent to ED, sent report generalized weakness, fatigue, poor appetite, he reports dyspnea at baseline, denies any chest pain, while in ED he was noted to have significant coffee-ground emesis.SABRA - In ED labs were significant for sodium 128, potassium 5.6, creatinine up to 1.58, hemoglobin was 7.7, baseline is 12.52 months ago, lactic acid significantly elevated at 7.4,.  Hospitalist consulted to admit   Review of systems:     A full 10 point Review of Systems was done, except as stated above, all other Review of Systems were negative.   With Past History of the following :    Past Medical History:  Diagnosis Date   Alcohol  use    Anemia    Cavitary lung disease    Cirrhosis (HCC)    Homelessness    Hyperbilirubinemia 07/01/2020   Peptic ulcer 08/09/1987   Jul 28, 2008 Entered By: KATHERINE ETHA HERO Comment: s/p partial gastrectomy   Thoracic ascending aortic aneurysm    Thrombocytopenia    Tobacco abuse       Past Surgical History:  Procedure Laterality Date   FLEXIBLE BRONCHOSCOPY Left 02/21/2024   Procedure: BRONCHOSCOPY,  FLEXIBLE;  Surgeon: Shelah Lamar RAMAN, MD;  Location: [facility] ENDOSCOPY;  Service: Cardiopulmonary;  Laterality: Left;   FLEXIBLE BRONCHOSCOPY Bilateral 04/19/2024   Procedure: BRONCHOSCOPY, FLEXIBLE;  Surgeon: Kara Dorn NOVAK, MD;  Location: [facility] ENDOSCOPY;  Service: Pulmonary;  Laterality: Bilateral;   ulcer surgery in stomach     pt states he had this approx 33 years ago      Social History:     Social History   Tobacco Use   Smoking status: Every Day    Current packs/day: 1.00    Average packs/day: 1 pack/day for 40.0 years (40.0 ttl pk-yrs)    Types: Cigarettes   Smokeless tobacco: Never  Substance Use Topics   Alcohol  use: Yes    Alcohol /week: 12.0 standard drinks of alcohol     Types: 12 Cans of beer per week    Comment: daily 12- 24 beers per notes  Family History :   Family history was reviewed, nonpertinent   Home Medications:   Prior to Admission medications  Medication Sig Start Date End Date Taking? Authorizing Provider  amoxicillin -clavulanate (AUGMENTIN ) 875-125 MG tablet Take 1 tablet by mouth 2 (two) times daily. 06/04/24   Kara Dorn NOVAK, MD  apixaban  (ELIQUIS ) 5 MG TABS tablet 10 mg p.o. twice daily, and on 10/1 switch to 5 mg p.o. twice daily 05/02/24   Ghimire, Donalda HERO, MD  cyanocobalamin  1000 MCG tablet Take 1 tablet (1,000 mcg total) by mouth daily. 05/03/24   Ghimire, Donalda HERO, MD  folic acid  (FOLVITE ) 1 MG tablet Take 1 tablet (1 mg total) by mouth daily. 02/28/24   Lue Elsie BROCKS, MD  furosemide  (LASIX ) 80 MG tablet Take 1 tablet (80 mg total) by mouth daily. 05/03/24   Ghimire, Donalda HERO, MD  levalbuterol  (XOPENEX ) 1.25 MG/0.5ML nebulizer solution Take 1.25 mg by nebulization every 6 (six) hours as needed for wheezing or shortness of breath. 05/02/24   Ghimire, Donalda HERO, MD  lidocaine  (LIDODERM ) 5 % Place 1 patch onto the skin daily. Remove & Discard patch within 12 hours or as directed by MD 05/03/24   Raenelle Donalda HERO, MD  Multiple  Vitamin (MULTIVITAMIN WITH MINERALS) TABS tablet Take 1 tablet by mouth daily. 05/03/24   Ghimire, Donalda HERO, MD  nicotine  (NICODERM CQ  - DOSED IN MG/24 HOURS) 14 mg/24hr patch Place 1 patch (14 mg total) onto the skin daily. 05/03/24   Ghimire, Donalda HERO, MD  pantoprazole  (PROTONIX ) 40 MG tablet Take 1 tablet (40 mg total) by mouth daily. 05/03/24   Ghimire, Donalda HERO, MD  polyethylene glycol (MIRALAX  / GLYCOLAX ) 17 g packet Take 17 g by mouth daily as needed for mild constipation. 05/02/24   Ghimire, Donalda HERO, MD  potassium chloride  SA (KLOR-CON  M) 20 MEQ tablet Take 1 tablet (20 mEq total) by mouth daily. 05/03/24   Ghimire, Donalda HERO, MD  thiamine  (VITAMIN B1) 100 MG tablet Take 1 tablet (100 mg total) by mouth daily. 02/28/24   Lue Elsie BROCKS, MD     Allergies:    Allergies[1]   Physical Exam:   Vitals  Blood pressure (!) 144/80, pulse (!) 103, temperature 98.6 F (37 C), temperature source Oral, resp. rate 20, height 5' 6 (1.676 m), weight 48 kg, SpO2 90%.   1. General Cachectic, ill-appearing male, appears older than stated age  42. Normal affect and insight, Not Suicidal or Homicidal, Awake Alert, Oriented X 3.  3. No F.N deficits, ALL C.Nerves Intact, Strength 5/5 all 4 extremities, Sensation intact all 4 extremities, Plantars down going.  4. Ears and Eyes appear Normal, Conjunctivae clear, patient having dried blood is in mouth.  5. Supple Neck, No JVD, No Carotid Bruits.  6. Symmetrical Chest wall movement, scattered rales bilaterally  7.  Tachycardic , some gallop present  8. Positive Bowel Sounds, Abdomen Soft, No tenderness,abd is scaphoid  9.  Pale, delayed skin turgor  10.  Severe muscle wasting     Data Review:    CBC Recent Labs  Lab 07/26/24 1406 07/26/24 1432  WBC  --  12.7*  HGB 9.5* 7.7*  HCT 28.0* 24.4*  PLT  --  457*  MCV  --  96.1  MCH  --  30.3  MCHC  --  31.6  RDW  --  15.2  LYMPHSABS  --  0.9  MONOABS  --  0.7  EOSABS  --  0.0   BASOSABS  --  0.0   ------------------------------------------------------------------------------------------------------------------  Chemistries  Recent Labs  Lab 07/26/24 1406 07/26/24 1432  NA 127* 128*  K 6.2* 5.6*  CL 95* 90*  CO2  --  17*  GLUCOSE 151* 165*  BUN 43* 40*  CREATININE 1.90* 1.58*  CALCIUM   --  8.8*  AST  --  13*  ALT  --  <5  ALKPHOS  --  126  BILITOT  --  0.8   ------------------------------------------------------------------------------------------------------------------ estimated creatinine clearance is 30.8 mL/min (A) (by C-G formula based on SCr of 1.58 mg/dL (H)). ------------------------------------------------------------------------------------------------------------------ No results for input(s): TSH, T4TOTAL, T3FREE, THYROIDAB in the last 72 hours.  Invalid input(s): FREET3  Coagulation profile Recent Labs  Lab 07/26/24 1432  INR 3.8*   ------------------------------------------------------------------------------------------------------------------- No results for input(s): DDIMER in the last 72 hours. -------------------------------------------------------------------------------------------------------------------  Cardiac Enzymes No results for input(s): CKMB, TROPONINI, MYOGLOBIN in the last 168 hours.  Invalid input(s): CK ------------------------------------------------------------------------------------------------------------------    Component Value Date/Time   BNP 245.2 (H) 05/29/2024 1226     ---------------------------------------------------------------------------------------------------------------  Urinalysis    Component Value Date/Time   COLORURINE AMBER (A) 06/29/2020 1933   APPEARANCEUR CLEAR 06/29/2020 1933   LABSPEC 1.020 06/29/2020 1933   PHURINE 6.0 06/29/2020 1933   GLUCOSEU NEGATIVE 06/29/2020 1933   HGBUR NEGATIVE 06/29/2020 1933   BILIRUBINUR NEGATIVE 06/29/2020 1933    KETONESUR 20 (A) 06/29/2020 1933   PROTEINUR 100 (A) 06/29/2020 1933   NITRITE NEGATIVE 06/29/2020 1933   LEUKOCYTESUR NEGATIVE 06/29/2020 1933    ----------------------------------------------------------------------------------------------------------------   Imaging Results:    DG Chest Port 1 View Result Date: 07/26/2024 CLINICAL DATA:  Sepsis evaluation. EXAM: PORTABLE CHEST 1 VIEW COMPARISON:  April 29, 2024 FINDINGS: The heart size and mediastinal contours are within normal limits. A large thin walled cavitary lesion is seen within the left upper lobe. This is mildly increased in size when compared to the prior study. A large thick walled cavitary lesion is also noted within the mid right lung. This represents a new finding when compared to the prior study. No pleural effusion or pneumothorax is identified. Surgical clips are seen overlying the expected region of the esophageal hiatus. The visualized skeletal structures are unremarkable. IMPRESSION: 1. Large bilateral cavitary lesions which may represent sequelae associated with an atypical infection. Correlation with findings on the most recent chest CT is recommended (July 26, 2024). Electronically Signed   By: Suzen Dials M.D.   On: 07/26/2024 16:11   CT Chest Wo Contrast Result Date: 07/26/2024 CLINICAL DATA:  Follow-up left upper lobe cavitary lesion. Decreased level of consciousness and fixed stare today. EXAM: CT CHEST WITHOUT CONTRAST TECHNIQUE: Multidetector CT imaging of the chest was performed following the standard protocol without IV contrast. RADIATION DOSE REDUCTION: This exam was performed according to the departmental dose-optimization program which includes automated exposure control, adjustment of the mA and/or kV according to patient size and/or use of iterative reconstruction technique. COMPARISON:  04/28/2024 FINDINGS: Cardiovascular: Normal-sized heart. No pericardial effusion. Atheromatous calcifications,  including the coronary arteries and aorta. Diffuse low density of the blood relative to the arterial walls. Mediastinum/Nodes: Borderline enlarged mediastinal lymph nodes with improvement. These include a precarinal node with a short axis diameter of 8.6 mm on image number 127/10, previously 9.5 mm. An AP window lymph node has a short axis diameter of 8.2 mm on image number 122/10, previously 12.6 mm. Unremarkable thyroid  gland and trachea. Interval mildly dilated esophagus containing some fluid. Lungs/Pleura: The previously demonstrated 5.8 x 3.9 cm cavitary lesion in  the left upper lobe has increased in size. This currently measures 7.2 x 6.9 cm on image number 31/4, including a new communicating anterior component and a larger inferior component. This continues to contain some fluid. There is also a new cavitary lesion more inferiorly in the left upper lobe containing some fluid and moderately and mildly thickened walls. This measures 4.7 x 2.6 cm on image number 71/4. Also demonstrated is a new cavitary lesion with air-fluid levels and adjacent airspace consolidation in the right middle lobe and extending through the minor fissure into the right upper lobe. This measures 10.8 x 7.4 cm on image number 91/4. Bilateral centrilobular and paraseptal emphysema is noted. Small right pleural effusion and minimal left pleural effusion, both significantly decreased. Upper Abdomen: Dense atheromatous arterial calcifications without aneurysm. 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 IMPRESSION: 1. Interval enlargement of the previously demonstrated cavitary lesion in the left upper lobe with a new communicating anterior component and a larger inferior component. 2. New cavitary lesion with air-fluid levels and adjacent airspace consolidation in the right middle lobe and extending through the minor fissure into the right upper lobe. 3. New  cavitary lesion more inferiorly in the left upper lobe containing some fluid and moderately and mildly thickened walls. 4. Differential considerations for these findings include multifocal necrotizing pneumonia, necrotizing aspergillosis, tuberculosis, atypical mycobacterial infection and granulomatosis with polyangiitis. 5. Small right pleural effusion and minimal left pleural effusion, both significantly decreased. 6. Interval mildly dilated esophagus containing some fluid. This could be due to reflux or dysmotility. 7. Diffuse low density of the blood relative to the arterial walls, compatible with anemia. 8. Calcific coronary artery and aortic atherosclerosis. Aortic Atherosclerosis (ICD10-I70.0) and Emphysema (ICD10-J43.9). Electronically Signed   By: Elspeth Bathe M.D.   On: 07/26/2024 14:37       Assessment & Plan:    Principal Problem:   Hemorrhagic shock (HCC) Active Problems:   DVT of lower extremity, bilateral (HCC)   DNR (do not resuscitate)  Hemorrhagic shock GI bleed likely due to esophageal varices Acute blood loss anemia Coagulopathy/elevated INR Liver cirrhosis Syncope -History of alcoholic liver cirrhosis. - Presents with coffee-ground emesis, hemoglobin dropped from 12.5-7.7, with active coffee-ground emesis in ED. - On Eliquis , as well cirrhosis coagulopathy, will provide vitamin K  and Kcentra . - Anesthesia and GI input greatly appreciated, patient is extremely high risk for anesthesia for endoscopy, even multiorgan failures with known EF 10%, extremely high risk of mortality procedures, risks outweighed benefits. - Please see discussion below under goals of care discussion. - For now continue with conservative management, supportive transfusion, 2 units PRBC ordered, octreotide  drip, close intra and IV vitamin K . - Will keep n.p.o. except sips with meds   AKI Hyperkalemia Metabolic acidosis Elevated lactic acid -Secondary to hypoperfusion due to above. - Continue  with IV fluids,. - Hyperkalemia protocol, Lokelma , bicarb, D50 with IV insulin  and albuterol  - Current telemetry  Hyponatremia -Volume depletion, continue with IV fluids  History of bilateral DVT - Hold Eliquis  due to above,  Acute hypoxic respiratory failure  Multiple lung cavitary lesions  - Currently requiring 4 L oxygen . - Worsening multiple cavitary lesions on CT scan, recent history of actinobacter of pneumonia . - Will start on meropenem  .   Chronic HFrEF -Recent EF 10% and echo September 2025, suspicion for EtOH cardiomyopathy -No diuresis or GDMT Meds due to above   Alcoholic liver cirrhosis Coagulopathy -Please see  above discussion -Nodular liver seen on imaging studies the past, this is likely due to alcohol  abuse    History of peptic ulcer disease - On IV Protonix    COPD Stable-not in exacerbation As needed bronchodilators    Severe protein calorie malnutrition  To thrive   Hypoalbuminemia - Patient is cachectic -Will address nutritional status if he survives   Goals of care discussion -I have discussed with patient, and his brother MARYLAND Kotyk (patient confirmed he wants Kotyk to be his HCPOA), that patient with multiorgan failure, high risk for any procedure, prognosis is very poor, discussed goals of care, for now we will continue with current measures including transfusions, octreotide  and Eliquis  reversal, but if he fails to respond to current measures, and continues to decline and decompensate, keeping in mind prognosis is very poor, severely deconditioned with multiple organ failure at baseline, cachectic, plan will be with no escalation of care, no pressors, no central lines, and to proceed with full comfort measures.     DVT Prophylaxis, no pharmacologic anticoagulation due to GI bleed, no SCDs given known bilateral DVTs  AM Labs Ordered, also please review Full Orders  Family Communication: Admission, patients condition and plan of care  including tests being ordered have been discussed with the patient and brother [name] by phonewho indicate understanding and agree with the plan and Code Status.  Code Status DNR/DNI, no escalation of care, if decompensates then full comfort measures  Condition GUARDED  /critical  Consults called: GI    Admission status: inpatient     Time spent in minutes : 70 minutes  The patient is critically ill with multi-organ failure.  Critical care was necessary to treat or prevent imminent or life-threatening deterioration of sepsis, acute respiratory failure, cardiac failure, acute renal failure and shock was exclusive of separately billable procedures and treating other patients. Total critical care time spent by me: 70 minutes Time spent personally by me on obtaining history from patient or surrogate, evaluation of the patient, evaluation of patient's response to treatment, ordering and review of laboratory studies, ordering and review of radiographic studies, ordering and performing treatments and interventions, and re-evaluation of the patient's condition.   Brayton Lye M.D on 07/26/2024 at 5:05 PM   [facility] - Office  [phone]        [1]  Allergies Allergen Reactions   Lactose Intolerance (Gi)     Per pt report   "

## 2024-07-26 NOTE — ED Triage Notes (Signed)
 Pt was here for a out patient CT and rapid response was called on the patient for a syncopal episode in CT room. Per radiology nurse pt went limb and unresponsive in wheel chair. Pt now has minimal response with excessive stimulation. Edp aware. Pt has DNR listed on his paper work but does not have physical DNR copy with him. Also confirmed with the nurse at [facility] rehab that pt is a DNR. PER edp we will honor pts dnr.

## 2024-07-26 NOTE — ED Notes (Signed)
 RT at bedside.

## 2024-07-26 NOTE — Progress Notes (Signed)
 eLink Physician-Brief Progress Note Patient Name: Cody Glenn DOB: 10-07-1955 MRN: [id number]   Date of Service  07/26/2024  HPI/Events of Note  Patient admitted secondary to a syncopal episode caused by acute GI bleeding with hypotension and acute blood loss anemia. 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  eICU Interventions  New Patient Evaluation.        Latrina Guttman U Danasia Baker 07/26/2024, 8:28 PM

## 2024-07-26 NOTE — ED Provider Notes (Signed)
 " [facility] EMERGENCY DEPARTMENT AT [facility] Provider Note   CSN: [id number] Arrival date & time: 07/26/24  1350     Patient presents with: Loss of Consciousness   Cody Glenn is a 68 y.o. male.   Patient has a history of diabetes, EtOH abuse, hypertension and COPD with large cavitary lesions.  He is a DNR/DNI and was getting a CT scan of his chest as an outpatient when he had a syncopal episode.  Initially his blood pressure was very low.  But he responded to IV fluids  The history is provided by a caregiver. No language interpreter was used.  Loss of Consciousness Episode history:  Single Most recent episode:  Today Timing:  Unable to specify Progression:  Resolved Chronicity:  New Context: not blood draw   Witnessed: no   Relieved by:  Nothing Worsened by:  Nothing Ineffective treatments:  None tried Associated symptoms: no chest pain, no headaches and no seizures        Prior to Admission medications  Medication Sig Start Date End Date Taking? Authorizing Provider  amoxicillin -clavulanate (AUGMENTIN ) 875-125 MG tablet Take 1 tablet by mouth 2 (two) times daily. 06/04/24   Kara Dorn NOVAK, MD  apixaban  (ELIQUIS ) 5 MG TABS tablet 10 mg p.o. twice daily, and on 10/1 switch to 5 mg p.o. twice daily 05/02/24   Ghimire, Donalda HERO, MD  cyanocobalamin  1000 MCG tablet Take 1 tablet (1,000 mcg total) by mouth daily. 05/03/24   Ghimire, Donalda HERO, MD  folic acid  (FOLVITE ) 1 MG tablet Take 1 tablet (1 mg total) by mouth daily. 02/28/24   Lue Elsie BROCKS, MD  furosemide  (LASIX ) 80 MG tablet Take 1 tablet (80 mg total) by mouth daily. 05/03/24   Ghimire, Donalda HERO, MD  levalbuterol  (XOPENEX ) 1.25 MG/0.5ML nebulizer solution Take 1.25 mg by nebulization every 6 (six) hours as needed for wheezing or shortness of breath. 05/02/24   Ghimire, Donalda HERO, MD  lidocaine  (LIDODERM ) 5 % Place 1 patch onto the skin daily. Remove & Discard patch within 12 hours or as directed  by MD 05/03/24   Raenelle Donalda HERO, MD  Multiple Vitamin (MULTIVITAMIN WITH MINERALS) TABS tablet Take 1 tablet by mouth daily. 05/03/24   Ghimire, Donalda HERO, MD  nicotine  (NICODERM CQ  - DOSED IN MG/24 HOURS) 14 mg/24hr patch Place 1 patch (14 mg total) onto the skin daily. 05/03/24   Ghimire, Donalda HERO, MD  pantoprazole  (PROTONIX ) 40 MG tablet Take 1 tablet (40 mg total) by mouth daily. 05/03/24   Ghimire, Donalda HERO, MD  polyethylene glycol (MIRALAX  / GLYCOLAX ) 17 g packet Take 17 g by mouth daily as needed for mild constipation. 05/02/24   Ghimire, Donalda HERO, MD  potassium chloride  SA (KLOR-CON  M) 20 MEQ tablet Take 1 tablet (20 mEq total) by mouth daily. 05/03/24   Ghimire, Donalda HERO, MD  thiamine  (VITAMIN B1) 100 MG tablet Take 1 tablet (100 mg total) by mouth daily. 02/28/24   Lue Elsie BROCKS, MD    Allergies: Lactose intolerance (gi)    Review of Systems  Constitutional:  Negative for appetite change and fatigue.  HENT:  Negative for congestion, ear discharge and sinus pressure.   Eyes:  Negative for discharge.  Respiratory:  Negative for cough.   Cardiovascular:  Positive for syncope. Negative for chest pain.  Gastrointestinal:  Negative for abdominal pain and diarrhea.  Genitourinary:  Negative for frequency and hematuria.  Musculoskeletal:  Negative for back pain.  Skin:  Negative  for rash.  Neurological:  Negative for seizures and headaches.  Psychiatric/Behavioral:  Negative for hallucinations.     Updated Vital Signs BP 122/85   Temp 98.5 F (36.9 C) (Rectal)   Resp (!) 21   Ht 5' 6 (1.676 m)   Wt 48 kg   BMI 17.08 kg/m   Physical Exam Vitals and nursing note reviewed.  Constitutional:      Appearance: He is well-developed. He is ill-appearing.  HENT:     Head: Normocephalic.     Nose: Nose normal.     Mouth/Throat:     Mouth: Mucous membranes are dry.  Eyes:     General: No scleral icterus.    Conjunctiva/sclera: Conjunctivae normal.  Neck:     Thyroid : No  thyromegaly.  Cardiovascular:     Rate and Rhythm: Regular rhythm. Tachycardia present.     Heart sounds: No murmur heard.    No friction rub. No gallop.  Pulmonary:     Breath sounds: No stridor. No wheezing or rales.  Chest:     Chest wall: No tenderness.  Abdominal:     General: There is no distension.     Tenderness: There is no abdominal tenderness. There is no rebound.  Musculoskeletal:        General: Normal range of motion.     Cervical back: Neck supple.  Lymphadenopathy:     Cervical: No cervical adenopathy.  Skin:    Findings: No erythema or rash.  Neurological:     Mental Status: He is alert and oriented to person, place, and time.     Motor: No abnormal muscle tone.     Coordination: Coordination normal.  Psychiatric:        Behavior: Behavior normal.     (all labs ordered are listed, but only abnormal results are displayed) Labs Reviewed  LACTIC ACID, PLASMA - Abnormal; Notable for the following components:      Result Value   Lactic Acid, Venous 7.4 (*)    All other components within normal limits  COMPREHENSIVE METABOLIC PANEL WITH GFR - Abnormal; Notable for the following components:   Sodium 128 (*)    Potassium 5.6 (*)    Chloride 90 (*)    CO2 17 (*)    Glucose, Bld 165 (*)    BUN 40 (*)    Creatinine, Ser 1.58 (*)    Calcium  8.8 (*)    Albumin 2.4 (*)    AST 13 (*)    GFR, Estimated 48 (*)    Anion gap 21 (*)    All other components within normal limits  CBC WITH DIFFERENTIAL/PLATELET - Abnormal; Notable for the following components:   WBC 12.7 (*)    RBC 2.54 (*)    Hemoglobin 7.7 (*)    HCT 24.4 (*)    Platelets 457 (*)    Neutro Abs 11.0 (*)    Abs Immature Granulocytes 0.10 (*)    All other components within normal limits  PROTIME-INR - Abnormal; Notable for the following components:   Prothrombin Time 38.9 (*)    INR 3.8 (*)    All other components within normal limits  CBG MONITORING, ED - Abnormal; Notable for the following  components:   Glucose-Capillary 146 (*)    All other components within normal limits  I-STAT CHEM 8, ED - Abnormal; Notable for the following components:   Sodium 127 (*)    Potassium 6.2 (*)    Chloride 95 (*)  BUN 43 (*)    Creatinine, Ser 1.90 (*)    Glucose, Bld 151 (*)    Calcium , Ion 1.02 (*)    TCO2 21 (*)    Hemoglobin 9.5 (*)    HCT 28.0 (*)    All other components within normal limits  TROPONIN T, HIGH SENSITIVITY - Abnormal; Notable for the following components:   Troponin T High Sensitivity 51 (*)    All other components within normal limits  RESP PANEL BY RT-PCR (RSV, FLU A&B, COVID)  RVPGX2  CULTURE, BLOOD (ROUTINE X 2)  CULTURE, BLOOD (ROUTINE X 2)  LACTIC ACID, PLASMA  URINALYSIS, W/ REFLEX TO CULTURE (INFECTION SUSPECTED)  BLOOD GAS, ARTERIAL    EKG: EKG Interpretation Date/Time:  Friday July 26 2024 13:55:21 EST Ventricular Rate:  118 PR Interval:  147 QRS Duration:  94 QT Interval:  307 QTC Calculation: 431 R Axis:   71  Text Interpretation: Sinus tachycardia Borderline low voltage, extremity leads Confirmed by Suzette Pac [phone]) on 07/26/2024 2:18:03 PM  Radiology: CT Chest Wo Contrast Result Date: 07/26/2024 CLINICAL DATA:  Follow-up left upper lobe cavitary lesion. Decreased level of consciousness and fixed stare today. EXAM: CT CHEST WITHOUT CONTRAST TECHNIQUE: Multidetector CT imaging of the chest was performed following the standard protocol without IV contrast. RADIATION DOSE REDUCTION: This exam was performed according to the departmental dose-optimization program which includes automated exposure control, adjustment of the mA and/or kV according to patient size and/or use of iterative reconstruction technique. COMPARISON:  04/28/2024 FINDINGS: Cardiovascular: Normal-sized heart. No pericardial effusion. Atheromatous calcifications, including the coronary arteries and aorta. Diffuse low density of the blood relative to the arterial walls.  Mediastinum/Nodes: Borderline enlarged mediastinal lymph nodes with improvement. These include a precarinal node with a short axis diameter of 8.6 mm on image number 127/10, previously 9.5 mm. An AP window lymph node has a short axis diameter of 8.2 mm on image number 122/10, previously 12.6 mm. Unremarkable thyroid  gland and trachea. Interval mildly dilated esophagus containing some fluid. Lungs/Pleura: The previously demonstrated 5.8 x 3.9 cm cavitary lesion in the left upper lobe has increased in size. This currently measures 7.2 x 6.9 cm on image number 31/4, including a new communicating anterior component and a larger inferior component. This continues to contain some fluid. There is also a new cavitary lesion more inferiorly in the left upper lobe containing some fluid and moderately and mildly thickened walls. This measures 4.7 x 2.6 cm on image number 71/4. Also demonstrated is a new cavitary lesion with air-fluid levels and adjacent airspace consolidation in the right middle lobe and extending through the minor fissure into the right upper lobe. This measures 10.8 x 7.4 cm on image number 91/4. Bilateral centrilobular and paraseptal emphysema is noted. Small right pleural effusion and minimal left pleural effusion, both significantly decreased. Upper Abdomen: Dense atheromatous arterial calcifications without aneurysm. 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 IMPRESSION: 1. Interval enlargement of the previously demonstrated cavitary lesion in the left upper lobe with a new communicating anterior component and a larger inferior component. 2. New cavitary lesion with air-fluid levels and adjacent airspace consolidation in the right middle lobe and extending through the minor fissure into the right upper lobe. 3. New cavitary lesion more inferiorly in the left upper lobe containing some fluid and moderately and mildly  thickened walls. 4. Differential considerations for these findings include multifocal necrotizing pneumonia, necrotizing aspergillosis, tuberculosis, atypical mycobacterial  infection and granulomatosis with polyangiitis. 5. Small right pleural effusion and minimal left pleural effusion, both significantly decreased. 6. Interval mildly dilated esophagus containing some fluid. This could be due to reflux or dysmotility. 7. Diffuse low density of the blood relative to the arterial walls, compatible with anemia. 8. Calcific coronary artery and aortic atherosclerosis. Aortic Atherosclerosis (ICD10-I70.0) and Emphysema (ICD10-J43.9). Electronically Signed   By: Elspeth Bathe M.D.   On: 07/26/2024 14:37     Procedures   Medications Ordered in the ED  cefTRIAXone (ROCEPHIN) 2 g in sodium chloride  0.9 % 100 mL IVPB (2 g Intravenous New Bag/Given 07/26/24 1524)  azithromycin  (ZITHROMAX ) 500 mg in sodium chloride  0.9 % 250 mL IVPB (500 mg Intravenous New Bag/Given 07/26/24 1526)  sodium chloride  0.9 % bolus 1,000 mL (has no administration in time range)  sodium chloride  0.9 % bolus 2,000 mL (1,000 mLs Intravenous New Bag/Given 07/26/24 1521)  CRITICAL CARE Performed by: Fairy Sermon Total critical care time: 45 minutes Critical care time was exclusive of separately billable procedures and treating other patients. Critical care was necessary to treat or prevent imminent or life-threatening deterioration. Critical care was time spent personally by me on the following activities: development of treatment plan with patient and/or surrogate as well as nursing, discussions with consultants, evaluation of patient's response to treatment, examination of patient, obtaining history from patient or surrogate, ordering and performing treatments and interventions, ordering and review of laboratory studies, ordering and review of radiographic studies, pulse oximetry and re-evaluation of patient's condition.  I spoke with  critical care Dr. Donzetta and he recommended medical admission to [name] or [facility] with critical care consulting                                 Medical Decision Making Amount and/or Complexity of Data Reviewed Labs: ordered. Radiology: ordered.  Risk Prescription drug management. Decision regarding hospitalization.   Patient with syncopal episode and severe pulmonary disease with cavitary lesion and AKI with possible sepsis and anemia.     Final diagnoses:  None    ED Discharge Orders     None          Sermon Fairy, MD 07/26/24 1724  "

## 2024-07-26 NOTE — Progress Notes (Signed)
 Attended rapid response in CT (due to patient's syncopal episode) at approximately 1410 .

## 2024-07-26 NOTE — Sepsis Progress Note (Signed)
 Code Sepsis protocol being monitored by e[employer].

## 2024-07-27 DIAGNOSIS — K7030 Alcoholic cirrhosis of liver without ascites: Secondary | ICD-10-CM | POA: Diagnosis not present

## 2024-07-27 DIAGNOSIS — R55 Syncope and collapse: Secondary | ICD-10-CM

## 2024-07-27 DIAGNOSIS — K920 Hematemesis: Secondary | ICD-10-CM | POA: Diagnosis not present

## 2024-07-27 DIAGNOSIS — I825Y3 Chronic embolism and thrombosis of unspecified deep veins of proximal lower extremity, bilateral: Secondary | ICD-10-CM | POA: Diagnosis not present

## 2024-07-27 DIAGNOSIS — Z66 Do not resuscitate: Secondary | ICD-10-CM | POA: Diagnosis not present

## 2024-07-27 DIAGNOSIS — K922 Gastrointestinal hemorrhage, unspecified: Secondary | ICD-10-CM | POA: Diagnosis not present

## 2024-07-27 DIAGNOSIS — K7460 Unspecified cirrhosis of liver: Secondary | ICD-10-CM | POA: Diagnosis not present

## 2024-07-27 DIAGNOSIS — R578 Other shock: Secondary | ICD-10-CM | POA: Diagnosis not present

## 2024-07-27 LAB — COMPREHENSIVE METABOLIC PANEL WITH GFR
ALT: <5 U/L (ref 0–44)
AST: 14 U/L — ABNORMAL LOW (ref 15–41)
Albumin: 2.7 g/dL — ABNORMAL LOW (ref 3.5–5.0)
Alkaline Phosphatase: 86 U/L (ref 38–126)
Anion gap: 14 (ref 5–15)
BUN: 31 mg/dL — ABNORMAL HIGH (ref 8–23)
CO2: 21 mmol/L — ABNORMAL LOW (ref 22–32)
Calcium: 7.7 mg/dL — ABNORMAL LOW (ref 8.9–10.3)
Chloride: 99 mmol/L (ref 98–111)
Creatinine, Ser: 1.26 mg/dL — ABNORMAL HIGH (ref 0.61–1.24)
GFR, Estimated: >60 mL/min
Glucose, Bld: 111 mg/dL — ABNORMAL HIGH (ref 70–99)
Potassium: 3.6 mmol/L (ref 3.5–5.1)
Sodium: 134 mmol/L — ABNORMAL LOW (ref 135–145)
Total Bilirubin: 1.5 mg/dL — ABNORMAL HIGH (ref 0.0–1.2)
Total Protein: 5.7 g/dL — ABNORMAL LOW (ref 6.5–8.1)

## 2024-07-27 LAB — CBC
HCT: 27.1 % — ABNORMAL LOW (ref 39.0–52.0)
HCT: 28.6 % — ABNORMAL LOW (ref 39.0–52.0)
Hemoglobin: 8.8 g/dL — ABNORMAL LOW (ref 13.0–17.0)
Hemoglobin: 9.5 g/dL — ABNORMAL LOW (ref 13.0–17.0)
MCH: 30.4 pg (ref 26.0–34.0)
MCH: 30.4 pg (ref 26.0–34.0)
MCHC: 32.5 g/dL (ref 30.0–36.0)
MCHC: 33.2 g/dL (ref 30.0–36.0)
MCV: 91.4 fL (ref 80.0–100.0)
MCV: 93.8 fL (ref 80.0–100.0)
Platelets: 234 K/uL (ref 150–400)
Platelets: 271 K/uL (ref 150–400)
RBC: 2.89 MIL/uL — ABNORMAL LOW (ref 4.22–5.81)
RBC: 3.13 MIL/uL — ABNORMAL LOW (ref 4.22–5.81)
RDW: 14.6 % (ref 11.5–15.5)
RDW: 15 % (ref 11.5–15.5)
WBC: 9.4 K/uL (ref 4.0–10.5)
WBC: 9.5 K/uL (ref 4.0–10.5)
nRBC: 0 % (ref 0.0–0.2)
nRBC: 0 % (ref 0.0–0.2)

## 2024-07-27 LAB — PREPARE RBC (CROSSMATCH)

## 2024-07-27 LAB — BASIC METABOLIC PANEL WITH GFR
Anion gap: 12 (ref 5–15)
BUN: 32 mg/dL — ABNORMAL HIGH (ref 8–23)
CO2: 22 mmol/L (ref 22–32)
Calcium: 7.6 mg/dL — ABNORMAL LOW (ref 8.9–10.3)
Chloride: 98 mmol/L (ref 98–111)
Creatinine, Ser: 1.23 mg/dL (ref 0.61–1.24)
GFR, Estimated: >60 mL/min
Glucose, Bld: 118 mg/dL — ABNORMAL HIGH (ref 70–99)
Potassium: 3.8 mmol/L (ref 3.5–5.1)
Sodium: 133 mmol/L — ABNORMAL LOW (ref 135–145)

## 2024-07-27 LAB — PROTIME-INR
INR: 2.3 — ABNORMAL HIGH (ref 0.8–1.2)
Prothrombin Time: 26.6 s — ABNORMAL HIGH (ref 11.4–15.2)

## 2024-07-27 LAB — HEMOGLOBIN AND HEMATOCRIT, BLOOD
HCT: 34.7 % — ABNORMAL LOW (ref 39.0–52.0)
Hemoglobin: 11.4 g/dL — ABNORMAL LOW (ref 13.0–17.0)

## 2024-07-27 LAB — MAGNESIUM: Magnesium: 1.6 mg/dL — ABNORMAL LOW (ref 1.7–2.4)

## 2024-07-27 LAB — PHOSPHORUS: Phosphorus: 3.3 mg/dL (ref 2.5–4.6)

## 2024-07-27 LAB — MRSA NEXT GEN BY PCR, NASAL: MRSA by PCR Next Gen: NOT DETECTED

## 2024-07-27 MED ORDER — OCTREOTIDE ACETATE 500 MCG/ML IJ SOLN
INTRAMUSCULAR | Status: AC
  Filled 2024-07-27: qty 1

## 2024-07-27 MED ORDER — MAGNESIUM SULFATE 2 GM/50ML IV SOLN
2.0000 g | Freq: Once | INTRAVENOUS | Status: AC
  Administered 2024-07-27: 2 g via INTRAVENOUS
  Filled 2024-07-27: qty 50

## 2024-07-27 MED ORDER — MAGNESIUM SULFATE IN D5W 1-5 GM/100ML-% IV SOLN
1.0000 g | Freq: Once | INTRAVENOUS | Status: AC
  Administered 2024-07-27: 1 g via INTRAVENOUS
  Filled 2024-07-27: qty 100

## 2024-07-27 MED ORDER — ACETAMINOPHEN 325 MG PO TABS
650.0000 mg | ORAL_TABLET | Freq: Four times a day (QID) | ORAL | Status: DC | PRN
  Administered 2024-07-27: 650 mg via ORAL
  Filled 2024-07-27: qty 2

## 2024-07-27 MED ORDER — MAGNESIUM SULFATE 50 % IJ SOLN: 3.0000 g | Freq: Once | INTRAVENOUS | Status: DC

## 2024-07-27 NOTE — Plan of Care (Signed)
" °  Problem: Education: Goal: Knowledge of General Education information will improve Description: Including pain rating scale, medication(s)/side effects and non-pharmacologic comfort measures Outcome: Not Progressing   Problem: Health Behavior/Discharge Planning: Goal: Ability to manage health-related needs will improve Outcome: Not Progressing   Problem: Clinical Measurements: Goal: Cardiovascular complication will be avoided Outcome: Not Progressing   Problem: Activity: Goal: Risk for activity intolerance will decrease Outcome: Not Progressing   Problem: Nutrition: Goal: Adequate nutrition will be maintained Outcome: Not Progressing   Problem: Skin Integrity: Goal: Risk for impaired skin integrity will decrease Outcome: Not Progressing   "

## 2024-07-27 NOTE — Progress Notes (Signed)
 " PROGRESS NOTE    Cody Glenn  FMW:[id number] DOB: 1956/05/19 DOA: 07/26/2024  PCP: [name], MD   Chief Complaint  Patient presents with   Loss of Consciousness    Brief admission narrative:  As per H&P written by Dr. Sherlon on 07/26/2024 Cody Glenn  is a 68 y.o. male, with history of EtOH use, known lung cavitary lesion, HTN, homelessness, liver cirrhosis, recent hospitalization at [facility] for actinobacter pneumonia, status post bronchoscopy, diagnosis of systolic CHF EF 10%, liver cirrhosis, DVT on Eliquis , and COPD, patient was discharged to SNF. - Patient presented from SNF, for routine CT chest, apparently he had a syncopal event after his CT was done, blood pressure was low so he was sent to ED, sent report generalized weakness, fatigue, poor appetite, he reports dyspnea at baseline, denies any chest pain, while in ED he was noted to have significant coffee-ground emesis.SABRA - In ED labs were significant for sodium 128, potassium 5.6, creatinine up to 1.58, hemoglobin was 7.7, baseline is 12.52 months ago, lactic acid significantly elevated at 7.4,.  Hospitalist consulted to admit  Assessment & Plan: 1-Hemorrhagic shock GI bleed likely due to esophageal varices Acute blood loss anemia Coagulopathy/elevated INR Liver cirrhosis Syncope - Not a candidate for invasive treatment/workup. - Patient has received vitamin K  and Kcentra  - Status post 2 units of PRBCs; hemoglobin around 8 currently. -Continue to follow hemoglobin trend. - Continue IV PPI and also octreotide  drip - Continue avoiding the use of heparin  products/blood thinners in the setting of GI bleed (most likely esophageal varices bleeding). - Continue to follow GI service recommendation. - Continue n.p.o. for now.  2-AKI Hyperkalemia Metabolic acidosis Elevated lactic acid - Continue to maintain adequate hydration - Following hyperkalemia protocol patient potassium has now  stabilized and within normal limits - Continue telemetry monitoring - Follow electrolytes trend.  3-systolic heart failure/alcoholic cardiomyopathy  - No signs of fluid overload currently appreciated - Fluid resuscitation and blood transfusion will continue to be provided as mentioned above - Continue to follow daily weights, strict intake and output  - Overall prognosis is poor and guarded.  4-history of bilateral DVT - Holding Eliquis  in the setting of GI bleed with hemorrhagic shock. - Continue to avoid blood thinners.  5-acute hypoxic respiratory failure/multiple lung cavitary lesions - Patient CT scan demonstrating worsening multiple cavitary lesions and recent history of actinobacter pneumonia - Continue to wean off oxygen supplementation as tolerated; 2 L currently in place - Continue IV antibiotics - Continue bronchodilator management and supportive care.  6-history of COPD - No wheezing on exam - Continue current bronchodilator management.  7-history of peptic ulcer disease/GERD - Continue PPI as mentioned above.  8-severe protein calorie malnutrition/failure to thrive  - Patient will probably benefit of feeding supplements once able to tolerate by mouth -Body mass index is 15.52 kg/m.   9-hyponatremia - In the setting of chronic alcohol  use and cirrhosis - Will continue fluid resuscitation - Follow electrolytes trend.   DVT prophylaxis: SCDs Code Status: DNR/DNI. Family Communication: No family at bedside. Disposition:   Status is: Inpatient Remains inpatient appropriate because: Continue IV therapy.   Consultants:  Gastroenterology service Palliative care  Procedures:  See below for x-ray reports.  Antimicrobials:  IV meropenem    Subjective: Chronically ill in appearance; in no major distress currently.  Denies chest pain, nausea and any further vomiting.  Objective: Vitals:   07/27/24 0737 07/27/24 0800 07/27/24 0900 07/27/24 1000  BP:  110/72  104/78 109/72  Pulse:      Resp:  (!) 23 (!) 38 (!) 24  Temp: 99.3 F (37.4 C)     TempSrc: Rectal     SpO2:   100%   Weight:      Height:        Intake/Output Summary (Last 24 hours) at 07/27/2024 1049 Last data filed at 07/27/2024 0400 Gross per 24 hour  Intake 5352.41 ml  Output 200 ml  Net 5152.41 ml   Filed Weights   07/26/24 1400 07/26/24 1937  Weight: 48 kg 42.3 kg    Examination:  General exam: Appears chronically ill in appearance and cachectic; calm and in no major distress currently.  Reporting dry mouth sensation.  No fever, no chest pain, no nausea vomiting currently appreciated. Respiratory system: No wheezing, no crackles, no using accessory muscles.  Good saturation on 2 L supplementation. Cardiovascular system: Sinus tachycardia, no rubs, no gallops, no JVD. Gastrointestinal system: Abdomen is nondistended, soft and without guarding.  Positive bowel sounds. Central nervous system: Moving 4 limbs spontaneously.  No focal neurological deficits. Extremities: No cyanosis or clubbing. Skin: No petechiae. Psychiatry: Judgement and insight appear normal.  Flat affect appreciated on exam.  Data Reviewed: I have personally reviewed following labs and imaging studies  CBC: Recent Labs  Lab 07/26/24 1406 07/26/24 1432 07/26/24 2359 07/27/24 0321  WBC  --  12.7* 9.5 9.4  NEUTROABS  --  11.0*  --   --   HGB 9.5* 7.7* 9.5* 8.8*  HCT 28.0* 24.4* 28.6* 27.1*  MCV  --  96.1 91.4 93.8  PLT  --  457* 271 234    Basic Metabolic Panel: Recent Labs  Lab 07/26/24 1406 07/26/24 1432 07/26/24 2359 07/27/24 0321  NA 127* 128* 133* 134*  K 6.2* 5.6* 3.8 3.6  CL 95* 90* 98 99  CO2  --  17* 22 21*  GLUCOSE 151* 165* 118* 111*  BUN 43* 40* 32* 31*  CREATININE 1.90* 1.58* 1.23 1.26*  CALCIUM   --  8.8* 7.6* 7.7*  PHOS  --   --  3.3  --     GFR: Estimated Creatinine Clearance: 34 mL/min (A) (by C-G formula based on SCr of 1.26 mg/dL (H)).  Liver Function  Tests: Recent Labs  Lab 07/26/24 1432 07/27/24 0321  AST 13* 14*  ALT <5 <5  ALKPHOS 126 86  BILITOT 0.8 1.5*  PROT 7.5 5.7*  ALBUMIN  2.4* 2.7*    CBG: Recent Labs  Lab 07/26/24 1357  GLUCAP 146*     Recent Results (from the past 240 hours)  Resp panel by RT-PCR (RSV, Flu A&B, Covid) Anterior Nasal Swab     Status: None   Collection Time: 07/26/24  2:04 PM   Specimen: Anterior Nasal Swab  Result Value Ref Range Status   SARS Coronavirus 2 by RT PCR NEGATIVE NEGATIVE Final    Comment: (NOTE) SARS-CoV-2 target nucleic acids are NOT DETECTED.  The SARS-CoV-2 RNA is generally detectable in upper respiratory specimens during the acute phase of infection. The lowest concentration of SARS-CoV-2 viral copies this assay can detect is 138 copies/mL. A negative result does not preclude SARS-Cov-2 infection and should not be used as the sole basis for treatment or other patient management decisions. A negative result may occur with  improper specimen collection/handling, submission of specimen other than nasopharyngeal swab, presence of viral mutation(s) within the areas targeted by this assay, and inadequate number of viral copies(<138 copies/mL). A negative result must  be combined with clinical observations, patient history, and epidemiological information. The expected result is Negative.  Fact Sheet for Patients:  bloggercourse.com  Fact Sheet for Healthcare Providers:  seriousbroker.it  This test is no t yet approved or cleared by the [place]  FDA and  has been authorized for detection and/or diagnosis of SARS-CoV-2 by FDA under an Emergency Use Authorization (EUA). This EUA will remain  in effect (meaning this test can be used) for the duration of the COVID-19 declaration under Section 564(b)(1) of the Act, 21 U.S.C.section 360bbb-3(b)(1), unless the authorization is terminated  or revoked sooner.        Influenza A by PCR NEGATIVE NEGATIVE Final   Influenza B by PCR NEGATIVE NEGATIVE Final    Comment: (NOTE) The Xpert Xpress SARS-CoV-2/FLU/RSV plus assay is intended as an aid in the diagnosis of influenza from Nasopharyngeal swab specimens and should not be used as a sole basis for treatment. Nasal washings and aspirates are unacceptable for Xpert Xpress SARS-CoV-2/FLU/RSV testing.  Fact Sheet for Patients: bloggercourse.com  Fact Sheet for Healthcare Providers: seriousbroker.it  This test is not yet approved or cleared by the [place]  FDA and has been authorized for detection and/or diagnosis of SARS-CoV-2 by FDA under an Emergency Use Authorization (EUA). This EUA will remain in effect (meaning this test can be used) for the duration of the COVID-19 declaration under Section 564(b)(1) of the Act, 21 U.S.C. section 360bbb-3(b)(1), unless the authorization is terminated or revoked.     Resp Syncytial Virus by PCR NEGATIVE NEGATIVE Final    Comment: (NOTE) Fact Sheet for Patients: bloggercourse.com  Fact Sheet for Healthcare Providers: seriousbroker.it  This test is not yet approved or cleared by the [place]  FDA and has been authorized for detection and/or diagnosis of SARS-CoV-2 by FDA under an Emergency Use Authorization (EUA). This EUA will remain in effect (meaning this test can be used) for the duration of the COVID-19 declaration under Section 564(b)(1) of the Act, 21 U.S.C. section 360bbb-3(b)(1), unless the authorization is terminated or revoked.  Performed at [facility], [address] [place], [place] [address]   MRSA Next Gen by PCR, Nasal     Status: None   Collection Time: 07/26/24  7:33 PM   Specimen: Nasal Mucosa; Nasal Swab  Result Value Ref Range Status   MRSA by PCR Next Gen NOT DETECTED NOT DETECTED Final    Comment: (NOTE) The GeneXpert  MRSA Assay (FDA approved for NASAL specimens only), is one component of a comprehensive MRSA colonization surveillance program. It is not intended to diagnose MRSA infection nor to guide or monitor treatment for MRSA infections. Test performance is not FDA approved in patients less than 15 years old. Performed at [facility], [address], [place], [place] [address]          Radiology Studies: [facility] 1 View Result Date: 07/26/2024 CLINICAL DATA:  Sepsis evaluation. EXAM: PORTABLE CHEST 1 VIEW COMPARISON:  April 29, 2024 FINDINGS: The heart size and mediastinal contours are within normal limits. A large thin walled cavitary lesion is seen within the left upper lobe. This is mildly increased in size when compared to the prior study. A large thick walled cavitary lesion is also noted within the mid right lung. This represents a new finding when compared to the prior study. No pleural effusion or pneumothorax is identified. Surgical clips are seen overlying the expected region of the esophageal hiatus. The visualized skeletal structures are unremarkable. IMPRESSION: 1. Large bilateral  cavitary lesions which may represent sequelae associated with an atypical infection. Correlation with findings on the most recent chest CT is recommended (July 26, 2024). Electronically Signed   By: Suzen Dials M.D.   On: 07/26/2024 16:11   CT Chest Wo Contrast Result Date: 07/26/2024 CLINICAL DATA:  Follow-up left upper lobe cavitary lesion. Decreased level of consciousness and fixed stare today. EXAM: CT CHEST WITHOUT CONTRAST TECHNIQUE: Multidetector CT imaging of the chest was performed following the standard protocol without IV contrast. RADIATION DOSE REDUCTION: This exam was performed according to the departmental dose-optimization program which includes automated exposure control, adjustment of the mA and/or kV according to patient size and/or use of iterative reconstruction technique.  COMPARISON:  04/28/2024 FINDINGS: Cardiovascular: Normal-sized heart. No pericardial effusion. Atheromatous calcifications, including the coronary arteries and aorta. Diffuse low density of the blood relative to the arterial walls. Mediastinum/Nodes: Borderline enlarged mediastinal lymph nodes with improvement. These include a precarinal node with a short axis diameter of 8.6 mm on image number 127/10, previously 9.5 mm. An AP window lymph node has a short axis diameter of 8.2 mm on image number 122/10, previously 12.6 mm. Unremarkable thyroid  gland and trachea. Interval mildly dilated esophagus containing some fluid. Lungs/Pleura: The previously demonstrated 5.8 x 3.9 cm cavitary lesion in the left upper lobe has increased in size. This currently measures 7.2 x 6.9 cm on image number 31/4, including a new communicating anterior component and a larger inferior component. This continues to contain some fluid. There is also a new cavitary lesion more inferiorly in the left upper lobe containing some fluid and moderately and mildly thickened walls. This measures 4.7 x 2.6 cm on image number 71/4. Also demonstrated is a new cavitary lesion with air-fluid levels and adjacent airspace consolidation in the right middle lobe and extending through the minor fissure into the right upper lobe. This measures 10.8 x 7.4 cm on image number 91/4. Bilateral centrilobular and paraseptal emphysema is noted. Small right pleural effusion and minimal left pleural effusion, both significantly decreased. Upper Abdomen: Dense atheromatous arterial calcifications without aneurysm. 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 IMPRESSION: 1. Interval enlargement of the previously demonstrated cavitary lesion in the left upper lobe with a new communicating anterior component and a larger inferior component. 2. New cavitary lesion with air-fluid levels and adjacent  airspace consolidation in the right middle lobe and extending through the minor fissure into the right upper lobe. 3. New cavitary lesion more inferiorly in the left upper lobe containing some fluid and moderately and mildly thickened walls. 4. Differential considerations for these findings include multifocal necrotizing pneumonia, necrotizing aspergillosis, tuberculosis, atypical mycobacterial infection and granulomatosis with polyangiitis. 5. Small right pleural effusion and minimal left pleural effusion, both significantly decreased. 6. Interval mildly dilated esophagus containing some fluid. This could be due to reflux or dysmotility. 7. Diffuse low density of the blood relative to the arterial walls, compatible with anemia. 8. Calcific coronary artery and aortic atherosclerosis. Aortic Atherosclerosis (ICD10-I70.0) and Emphysema (ICD10-J43.9). Electronically Signed   By: Elspeth Bathe M.D.   On: 07/26/2024 14:37    Scheduled Meds:  Chlorhexidine  Gluconate Cloth  6 each Topical Q0600   nicotine   21 mg Transdermal Daily   pantoprazole  (PROTONIX ) IV  40 mg Intravenous Q12H   Continuous Infusions:  meropenem  (MERREM ) IV 1 g (07/27/24 0830)   octreotide  (SANDOSTATIN ) 500 mcg in sodium chloride  0.9 % 250 mL (2 mcg/mL) infusion 50  mcg/hr (07/27/24 0400)     LOS: 1 day    Time spent: 50 minutes   Eric Nunnery, MD [facility]   To contact the attending provider between 7A-7P or the covering provider during after hours 7P-7A, please log into the web site [email or web address] and access using universal [place] password for that web site. If you do not have the password, please call the hospital operator.  07/27/2024, 10:49 AM    "

## 2024-07-27 NOTE — Significant Event (Signed)
"  ° °      CROSS COVER NOTE  NAME: Cody Glenn MRN: [id number] DOB : 1956-04-13 ATTENDING PHYSICIAN: Ricky Fines, MD    Date of Service   07/27/2024   HPI/Events of Note   [facility] Cross Cover at [facility] received fro m nurse regarding new persistent tachycardia in 120's asymptomatic and reports generalized pain  HPI/ clinical course labs; vital data trend reviewed   Interventions   Assessment/Plan:    07/27/2024    9:02 PM 07/27/2024    9:00 PM 07/27/2024    8:00 PM  Vitals with BMI  Systolic 122 122 878  Diastolic 81 81 80   TEMP                    98.6 Low grade fever of 99.1 at shift change noted Acetaminophen  650 every 6 h generalized pain and fever Mag level 1.6 3 gm supplementation IV ordered H & H rechecked - trending up now at 11.4/34.7        Erminio LITTIE Cone NP [facility] Cross Cover 7pm-7am - check amion for availability Pager [phone]  "

## 2024-07-27 NOTE — Progress Notes (Signed)
 Patient repeatedly asking for a coke to drink. Educated patient on the importance of remaining NPO until GI gives us  their recommendation. Patient beginning to get agitated and pulling cardiac monitor leads off and refusing to let us  connect him back to the monitor. Dr. Ricky made aware.

## 2024-07-27 NOTE — Plan of Care (Signed)

## 2024-07-27 NOTE — Clinical Note (Incomplete)
 SABRA

## 2024-07-27 NOTE — Progress Notes (Signed)
 Subjective: Patient somewhat confused this morning.  He is requesting I remove cold pad from his pelvic area which is not present.  States it is very cold.  I believe he is talking about his condom catheter.  Discussed with RN, she states patient believes he is at home.  Objective: Vital signs in last 24 hours: Temp:  [94.8 F (34.9 C)-99.3 F (37.4 C)] 99.3 F (37.4 C) (12/20 0737) Pulse Rate:  [103-108] 108 (12/19 2238) Resp:  [12-38] 24 (12/20 1100) BP: (74-144)/(40-102) 114/77 (12/20 1100) SpO2:  [90 %-100 %] 100 % (12/20 0900) Weight:  [42.3 kg-48 kg] 42.3 kg (12/19 1937) Last BM Date : 07/24/24 General:   Alert, somewhat confused. , malnourished/cachectic, chronically ill-appearing  Head:  Normocephalic and atraumatic. Eyes:  No icterus, sclera clear. Conjuctiva pink.  Abdomen:  Bowel sounds present, soft, non-tender, non-distended. No HSM or hernias noted. No rebound or guarding. No masses appreciated  Msk:  Symmetrical without gross deformities. Normal posture. Extremities:  Without clubbing or edema. Neurologic:  Alert and  oriented x4;  grossly normal neurologically. Skin:  Warm and dry, intact without significant lesions.  Cervical Nodes:  No significant cervical adenopathy. Psych:  Alert and cooperative. Normal mood and affect.  Intake/Output from previous day: 12/19 0701 - 12/20 0700 In: 5352.4 [I.V.:265.6; Blood:1132.3; IV Piggyback:3954.5] Out: 200 [Urine:200] Intake/Output this shift: No intake/output data recorded.  Lab Results: Recent Labs    07/26/24 1432 07/26/24 2359 07/27/24 0321  WBC 12.7* 9.5 9.4  HGB 7.7* 9.5* 8.8*  HCT 24.4* 28.6* 27.1*  PLT 457* 271 234   BMET Recent Labs    07/26/24 1432 07/26/24 2359 07/27/24 0321  NA 128* 133* 134*  K 5.6* 3.8 3.6  CL 90* 98 99  CO2 17* 22 21*  GLUCOSE 165* 118* 111*  BUN 40* 32* 31*  CREATININE 1.58* 1.23 1.26*  CALCIUM  8.8* 7.6* 7.7*   LFT Recent Labs    07/26/24 1432 07/27/24 0321  PROT  7.5 5.7*  ALBUMIN  2.4* 2.7*  AST 13* 14*  ALT <5 <5  ALKPHOS 126 86  BILITOT 0.8 1.5*   PT/INR Recent Labs    07/26/24 1432 07/27/24 0321  LABPROT 38.9* 26.6*  INR 3.8* 2.3*   Hepatitis Panel No results for input(s): HEPBSAG, HCVAB, HEPAIGM, HEPBIGM in the last 72 hours.   Studies/Results: DG Chest Port 1 View Result Date: 07/26/2024 CLINICAL DATA:  Sepsis evaluation. EXAM: PORTABLE CHEST 1 VIEW COMPARISON:  April 29, 2024 FINDINGS: The heart size and mediastinal contours are within normal limits. A large thin walled cavitary lesion is seen within the left upper lobe. This is mildly increased in size when compared to the prior study. A large thick walled cavitary lesion is also noted within the mid right lung. This represents a new finding when compared to the prior study. No pleural effusion or pneumothorax is identified. Surgical clips are seen overlying the expected region of the esophageal hiatus. The visualized skeletal structures are unremarkable. IMPRESSION: 1. Large bilateral cavitary lesions which may represent sequelae associated with an atypical infection. Correlation with findings on the most recent chest CT is recommended (July 26, 2024). Electronically Signed   By: Suzen Dials M.D.   On: 07/26/2024 16:11   CT Chest Wo Contrast Result Date: 07/26/2024 CLINICAL DATA:  Follow-up left upper lobe cavitary lesion. Decreased level of consciousness and fixed stare today. EXAM: CT CHEST WITHOUT CONTRAST TECHNIQUE: Multidetector CT imaging of the chest was performed following the standard protocol without IV  contrast. RADIATION DOSE REDUCTION: This exam was performed according to the departmental dose-optimization program which includes automated exposure control, adjustment of the mA and/or kV according to patient size and/or use of iterative reconstruction technique. COMPARISON:  04/28/2024 FINDINGS: Cardiovascular: Normal-sized heart. No pericardial effusion.  Atheromatous calcifications, including the coronary arteries and aorta. Diffuse low density of the blood relative to the arterial walls. Mediastinum/Nodes: Borderline enlarged mediastinal lymph nodes with improvement. These include a precarinal node with a short axis diameter of 8.6 mm on image number 127/10, previously 9.5 mm. An AP window lymph node has a short axis diameter of 8.2 mm on image number 122/10, previously 12.6 mm. Unremarkable thyroid  gland and trachea. Interval mildly dilated esophagus containing some fluid. Lungs/Pleura: The previously demonstrated 5.8 x 3.9 cm cavitary lesion in the left upper lobe has increased in size. This currently measures 7.2 x 6.9 cm on image number 31/4, including a new communicating anterior component and a larger inferior component. This continues to contain some fluid. There is also a new cavitary lesion more inferiorly in the left upper lobe containing some fluid and moderately and mildly thickened walls. This measures 4.7 x 2.6 cm on image number 71/4. Also demonstrated is a new cavitary lesion with air-fluid levels and adjacent airspace consolidation in the right middle lobe and extending through the minor fissure into the right upper lobe. This measures 10.8 x 7.4 cm on image number 91/4. Bilateral centrilobular and paraseptal emphysema is noted. Small right pleural effusion and minimal left pleural effusion, both significantly decreased. Upper Abdomen: Dense atheromatous arterial calcifications without aneurysm. 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 IMPRESSION: 1. Interval enlargement of the previously demonstrated cavitary lesion in the left upper lobe with a new communicating anterior component and a larger inferior component. 2. New cavitary lesion with air-fluid levels and adjacent airspace consolidation in the right middle lobe and extending through the minor fissure into  the right upper lobe. 3. New cavitary lesion more inferiorly in the left upper lobe containing some fluid and moderately and mildly thickened walls. 4. Differential considerations for these findings include multifocal necrotizing pneumonia, necrotizing aspergillosis, tuberculosis, atypical mycobacterial infection and granulomatosis with polyangiitis. 5. Small right pleural effusion and minimal left pleural effusion, both significantly decreased. 6. Interval mildly dilated esophagus containing some fluid. This could be due to reflux or dysmotility. 7. Diffuse low density of the blood relative to the arterial walls, compatible with anemia. 8. Calcific coronary artery and aortic atherosclerosis. Aortic Atherosclerosis (ICD10-I70.0) and Emphysema (ICD10-J43.9). Electronically Signed   By: Elspeth Bathe M.D.   On: 07/26/2024 14:37    Assessment/Plan:  1.  Upper GI bleed, hematemesis-discussed in depth with patient as well as with anesthesiologist and hospitalist.  Given patient's EF of 10%, respiratory failure in the setting of COPD/cavitary lesions, hyperkalemia, felt to be too high risk for endoscopic procedures.  Patient with high probability of cardiac death with anesthesia.  Risks outweigh benefits.   Patient is DNR/DNI.  Would recommend conservative measures.  Does have underlying cirrhosis, assume variceal bleed until proven otherwise.   Patient has received Kcentra .   Continue on IV PPI twice daily.   Continue on octreotide  gtt x 72 hours.   Patient on antibiotics which will cross cover for SBP prophylaxis.  Okay to start on clear liquids from my standpoint.  Recommend thiamine  supplementation.   2.  Hemorrhagic shock, syncope-due to above.  Hemodynamically stable at present.  Hemoglobin  stable this a.m.  Continue to monitor H&H and transfuse for less than 7 or hemodynamic instability.     Consider palliative involvement.   GI to continue to follow.   Carlin POUR. Cindie,  D.O. Gastroenterology and Hepatology [facility]   LOS: 1 day    07/27/2024, 11:43 AM

## 2024-07-28 ENCOUNTER — Inpatient Hospital Stay (HOSPITAL_COMMUNITY)

## 2024-07-28 DIAGNOSIS — R55 Syncope and collapse: Secondary | ICD-10-CM | POA: Diagnosis not present

## 2024-07-28 DIAGNOSIS — Z66 Do not resuscitate: Secondary | ICD-10-CM | POA: Diagnosis not present

## 2024-07-28 DIAGNOSIS — K7030 Alcoholic cirrhosis of liver without ascites: Secondary | ICD-10-CM | POA: Diagnosis not present

## 2024-07-28 DIAGNOSIS — R578 Other shock: Secondary | ICD-10-CM | POA: Diagnosis not present

## 2024-07-28 MED ORDER — SCOPOLAMINE 1 MG/3DAYS TD PT72: 1.0000 | MEDICATED_PATCH | TRANSDERMAL | Status: DC

## 2024-07-28 MED ORDER — ACETAMINOPHEN 325 MG PO TABS: 650.0000 mg | ORAL_TABLET | Freq: Four times a day (QID) | ORAL | Status: DC | PRN

## 2024-07-28 MED ORDER — SODIUM CHLORIDE 0.9 % IV SOLN: INTRAVENOUS | Status: DC

## 2024-07-28 MED ORDER — PROCHLORPERAZINE 25 MG RE SUPP: 25.0000 mg | Freq: Two times a day (BID) | RECTAL | Status: DC | PRN

## 2024-07-28 MED ORDER — ADULT MULTIVITAMIN W/MINERALS CH: 1.0000 | ORAL_TABLET | Freq: Every day | ORAL | Status: DC

## 2024-07-28 MED ORDER — GLYCOPYRROLATE 1 MG PO TABS: 1.0000 mg | ORAL_TABLET | ORAL | Status: DC | PRN

## 2024-07-28 MED ORDER — ACETAMINOPHEN 650 MG RE SUPP: 650.0000 mg | Freq: Four times a day (QID) | RECTAL | Status: DC | PRN

## 2024-07-28 MED ORDER — GLYCOPYRROLATE 0.2 MG/ML IJ SOLN: 0.2000 mg | INTRAMUSCULAR | Status: DC | PRN

## 2024-07-28 MED ORDER — FOLIC ACID 1 MG PO TABS: 1.0000 mg | ORAL_TABLET | Freq: Every day | ORAL | Status: DC

## 2024-07-28 MED ORDER — HYDROMORPHONE HCL 1 MG/ML IJ SOLN
INTRAMUSCULAR | Status: AC
  Filled 2024-07-28: qty 1

## 2024-07-28 MED ORDER — POLYVINYL ALCOHOL 1.4 % OP SOLN: 1.0000 [drp] | Freq: Four times a day (QID) | OPHTHALMIC | Status: DC | PRN

## 2024-07-28 MED ORDER — PROCHLORPERAZINE MALEATE 10 MG PO TABS: 10.0000 mg | ORAL_TABLET | Freq: Four times a day (QID) | ORAL | Status: DC | PRN

## 2024-07-28 MED ORDER — OCTREOTIDE ACETATE 500 MCG/ML IJ SOLN
INTRAMUSCULAR | Status: AC
  Filled 2024-07-28: qty 1

## 2024-07-28 MED ORDER — PROCHLORPERAZINE EDISYLATE 10 MG/2ML IJ SOLN: 10.0000 mg | Freq: Four times a day (QID) | INTRAMUSCULAR | Status: DC | PRN

## 2024-07-28 MED ORDER — THIAMINE MONONITRATE 100 MG PO TABS: 100.0000 mg | ORAL_TABLET | Freq: Every day | ORAL | Status: DC

## 2024-07-28 MED ORDER — HYDROMORPHONE HCL 1 MG/ML IJ SOLN: 1.0000 mg | INTRAMUSCULAR | Status: DC | PRN

## 2024-07-28 MED ORDER — FUROSEMIDE 10 MG/ML IJ SOLN: 40.0000 mg | Freq: Once | INTRAMUSCULAR | Status: DC

## 2024-07-29 LAB — BPAM RBC
Blood Product Expiration Date: 202601202359
Blood Product Expiration Date: 202601272359
ISSUE DATE / TIME: 202512191633
ISSUE DATE / TIME: 202512191633
Unit Type and Rh: 9500
Unit Type and Rh: 9500

## 2024-07-29 LAB — TYPE AND SCREEN
ABO/RH(D): O POS
Antibody Screen: NEGATIVE
Unit division: 0
Unit division: 0

## 2024-08-08 NOTE — Plan of Care (Signed)

## 2024-08-08 NOTE — Death Summary Note (Signed)
 "  DEATH SUMMARY   Patient Details  Name: Cody Glenn MRN: [id number] DOB: 05-26-1956 ERE:Tmphyu, Belvie BRAVO, MD Admission/Discharge Information   Admit Date:  2024-08-04  Date of Death:  2024/08/06  Time of Death:  11:23  Length of Stay: 2   Principle Cause of death: hemorrhagic shock/esophageal bleeding (suspected)  Hospital Diagnoses: Principal Problem:   Hemorrhagic shock (HCC) Active Problems:   DVT of lower extremity, bilateral (HCC)   DNR (do not resuscitate)   Upper GI bleed   Alcoholic cirrhosis of liver without ascites (HCC)   Brief admission narrative:  As per H&P written by Dr. Sherlon on 04-Aug-2024 Cody Glenn  is a 69 y.o. male, with history of EtOH use, known lung cavitary lesion, HTN, homelessness, liver cirrhosis, recent hospitalization at [facility] for actinobacter pneumonia, status post bronchoscopy, diagnosis of systolic CHF EF 10%, liver cirrhosis, DVT on Eliquis , and COPD, patient was discharged to SNF. - Patient presented from SNF, for routine CT chest, apparently he had a syncopal event after his CT was done, blood pressure was low so he was sent to ED, sent report generalized weakness, fatigue, poor appetite, he reports dyspnea at baseline, denies any chest pain, while in ED he was noted to have significant coffee-ground emesis.SABRA - In ED labs were significant for sodium 128, potassium 5.6, creatinine up to 1.58, hemoglobin was 7.7, baseline is 12.52 months ago, lactic acid significantly elevated at 7.4,.  Hospitalist consulted to admit  Assessment and Plan: 1-Hemorrhagic shock GI bleed likely due to esophageal varices Acute blood loss anemia Coagulopathy/elevated INR Liver cirrhosis Syncope - Not a candidate for invasive treatment/workup. - Patient has received vitamin K  and Kcentra  - Status post 2 units of PRBCs; hemoglobin around 8.8 after transfusion  -following GI service rec's kept on IV PPI and octreotide  infusion. -rapid  deterioration despite interventions appreciated on 08/06/24 morning leading to patient expiring at 11:23 am. -family contacted and once again decisions for DNR/DNI and comfort care were confirmed.  2-AKI Hyperkalemia Metabolic acidosis Elevated lactic acid - nephrotoxic agents and avoidance of contrast and hypotension provided. -patient initially presented with hyperkalemia and that was stabilized and corrected. Continue to maintain adequate hydration -was tolerating clear liquid diet on 07/27/24 evening.    3-systolic heart failure/alcoholic cardiomyopathy  - No signs of fluid overload appreciated on admission -patient EF 10% - Fluid resuscitation and blood transfusion were provided as mentioned above - his Overall prognosis was poor and guarded. -decompensated rapidly and led to resp failure with him passing away at 11:23 am on August 06, 2024. -prior to expiring increase secretions and decreased BS at the bases appreciated.   4-history of bilateral DVT - Holding Eliquis  in the setting of GI bleed with hemorrhagic shock. - high risk to continue blood thinners    5-acute hypoxic respiratory failure/multiple lung cavitary lesions - Patient CT scan demonstrating worsening multiple cavitary lesions and recent history of actinobacter pneumonia - requiring 2-3 L of oxygen supplementation -was receiving IV antibiotics    6-history of COPD - No wheezing or acute exacerbation appreciated -bronchodilator management provided.   7-history of peptic ulcer disease/GERD - Continue PPI as mentioned above.   8-severe protein calorie malnutrition/failure to thrive  - Patient will probably benefit of feeding supplements once able to fully tolerate by mouth -Body mass index is 15.52 kg/m.    9-hyponatremia - In the setting of chronic alcohol  use and cirrhosis   Procedures: see below for x-ray reports   Consultations: GI service  The results of significant diagnostics from this hospitalization  (including imaging, microbiology, ancillary and laboratory) are listed below for reference.   Significant Diagnostic Studies: DG Chest Port 1 View Result Date: 07/26/2024 CLINICAL DATA:  Sepsis evaluation. EXAM: PORTABLE CHEST 1 VIEW COMPARISON:  April 29, 2024 FINDINGS: The heart size and mediastinal contours are within normal limits. A large thin walled cavitary lesion is seen within the left upper lobe. This is mildly increased in size when compared to the prior study. A large thick walled cavitary lesion is also noted within the mid right lung. This represents a new finding when compared to the prior study. No pleural effusion or pneumothorax is identified. Surgical clips are seen overlying the expected region of the esophageal hiatus. The visualized skeletal structures are unremarkable. IMPRESSION: 1. Large bilateral cavitary lesions which may represent sequelae associated with an atypical infection. Correlation with findings on the most recent chest CT is recommended (July 26, 2024). Electronically Signed   By: Suzen Dials M.D.   On: 07/26/2024 16:11   CT Chest Wo Contrast Result Date: 07/26/2024 CLINICAL DATA:  Follow-up left upper lobe cavitary lesion. Decreased level of consciousness and fixed stare today. EXAM: CT CHEST WITHOUT CONTRAST TECHNIQUE: Multidetector CT imaging of the chest was performed following the standard protocol without IV contrast. RADIATION DOSE REDUCTION: This exam was performed according to the departmental dose-optimization program which includes automated exposure control, adjustment of the mA and/or kV according to patient size and/or use of iterative reconstruction technique. COMPARISON:  04/28/2024 FINDINGS: Cardiovascular: Normal-sized heart. No pericardial effusion. Atheromatous calcifications, including the coronary arteries and aorta. Diffuse low density of the blood relative to the arterial walls. Mediastinum/Nodes: Borderline enlarged mediastinal  lymph nodes with improvement. These include a precarinal node with a short axis diameter of 8.6 mm on image number 127/10, previously 9.5 mm. An AP window lymph node has a short axis diameter of 8.2 mm on image number 122/10, previously 12.6 mm. Unremarkable thyroid  gland and trachea. Interval mildly dilated esophagus containing some fluid. Lungs/Pleura: The previously demonstrated 5.8 x 3.9 cm cavitary lesion in the left upper lobe has increased in size. This currently measures 7.2 x 6.9 cm on image number 31/4, including a new communicating anterior component and a larger inferior component. This continues to contain some fluid. There is also a new cavitary lesion more inferiorly in the left upper lobe containing some fluid and moderately and mildly thickened walls. This measures 4.7 x 2.6 cm on image number 71/4. Also demonstrated is a new cavitary lesion with air-fluid levels and adjacent airspace consolidation in the right middle lobe and extending through the minor fissure into the right upper lobe. This measures 10.8 x 7.4 cm on image number 91/4. Bilateral centrilobular and paraseptal emphysema is noted. Small right pleural effusion and minimal left pleural effusion, both significantly decreased. Upper Abdomen: Dense atheromatous arterial calcifications without aneurysm. 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 IMPRESSION: 1. Interval enlargement of the previously demonstrated cavitary lesion in the left upper lobe with a new communicating anterior component and a larger inferior component. 2. New cavitary lesion with air-fluid levels and adjacent airspace consolidation in the right middle lobe and extending through the minor fissure into the right upper lobe. 3. New cavitary lesion more inferiorly in the left upper lobe containing some fluid and moderately and mildly thickened walls. 4. Differential considerations for these  findings include multifocal necrotizing pneumonia, necrotizing aspergillosis, tuberculosis, atypical  mycobacterial infection and granulomatosis with polyangiitis. 5. Small right pleural effusion and minimal left pleural effusion, both significantly decreased. 6. Interval mildly dilated esophagus containing some fluid. This could be due to reflux or dysmotility. 7. Diffuse low density of the blood relative to the arterial walls, compatible with anemia. 8. Calcific coronary artery and aortic atherosclerosis. Aortic Atherosclerosis (ICD10-I70.0) and Emphysema (ICD10-J43.9). Electronically Signed   By: Elspeth Bathe M.D.   On: 07/26/2024 14:37    Microbiology: Recent Results (from the past 240 hours)  Resp panel by RT-PCR (RSV, Flu A&B, Covid) Anterior Nasal Swab     Status: None   Collection Time: 07/26/24  2:04 PM   Specimen: Anterior Nasal Swab  Result Value Ref Range Status   SARS Coronavirus 2 by RT PCR NEGATIVE NEGATIVE Final    Comment: (NOTE) SARS-CoV-2 target nucleic acids are NOT DETECTED.  The SARS-CoV-2 RNA is generally detectable in upper respiratory specimens during the acute phase of infection. The lowest concentration of SARS-CoV-2 viral copies this assay can detect is 138 copies/mL. A negative result does not preclude SARS-Cov-2 infection and should not be used as the sole basis for treatment or other patient management decisions. A negative result may occur with  improper specimen collection/handling, submission of specimen other than nasopharyngeal swab, presence of viral mutation(s) within the areas targeted by this assay, and inadequate number of viral copies(<138 copies/mL). A negative result must be combined with clinical observations, patient history, and epidemiological information. The expected result is Negative.  Fact Sheet for Patients:  bloggercourse.com  Fact Sheet for Healthcare Providers:   seriousbroker.it  This test is no t yet approved or cleared by the [place]  FDA and  has been authorized for detection and/or diagnosis of SARS-CoV-2 by FDA under an Emergency Use Authorization (EUA). This EUA will remain  in effect (meaning this test can be used) for the duration of the COVID-19 declaration under Section 564(b)(1) of the Act, 21 U.S.C.section 360bbb-3(b)(1), unless the authorization is terminated  or revoked sooner.       Influenza A by PCR NEGATIVE NEGATIVE Final   Influenza B by PCR NEGATIVE NEGATIVE Final    Comment: (NOTE) The Xpert Xpress SARS-CoV-2/FLU/RSV plus assay is intended as an aid in the diagnosis of influenza from Nasopharyngeal swab specimens and should not be used as a sole basis for treatment. Nasal washings and aspirates are unacceptable for Xpert Xpress SARS-CoV-2/FLU/RSV testing.  Fact Sheet for Patients: bloggercourse.com  Fact Sheet for Healthcare Providers: seriousbroker.it  This test is not yet approved or cleared by the [place]  FDA and has been authorized for detection and/or diagnosis of SARS-CoV-2 by FDA under an Emergency Use Authorization (EUA). This EUA will remain in effect (meaning this test can be used) for the duration of the COVID-19 declaration under Section 564(b)(1) of the Act, 21 U.S.C. section 360bbb-3(b)(1), unless the authorization is terminated or revoked.     Resp Syncytial Virus by PCR NEGATIVE NEGATIVE Final    Comment: (NOTE) Fact Sheet for Patients: bloggercourse.com  Fact Sheet for Healthcare Providers: seriousbroker.it  This test is not yet approved or cleared by the [place]  FDA and has been authorized for detection and/or diagnosis of SARS-CoV-2 by FDA under an Emergency Use Authorization (EUA). This EUA will remain in effect (meaning this test can be used) for  the duration of the COVID-19 declaration under Section 564(b)(1) of the Act, 21 U.S.C. section 360bbb-3(b)(1), unless the authorization is terminated or revoked.  Performed at [facility],  [address], [place], [place] [address]   MRSA Next Gen by PCR, Nasal     Status: None   Collection Time: 07/26/24  7:33 PM   Specimen: Nasal Mucosa; Nasal Swab  Result Value Ref Range Status   MRSA by PCR Next Gen NOT DETECTED NOT DETECTED Final    Comment: (NOTE) The GeneXpert MRSA Assay (FDA approved for NASAL specimens only), is one component of a comprehensive MRSA colonization surveillance program. It is not intended to diagnose MRSA infection nor to guide or monitor treatment for MRSA infections. Test performance is not FDA approved in patients less than 87 years old. Performed at [facility], [address], [place], [place] [address]     Time spent: 35 minutes  Signed: Eric Nunnery, MD 07/30/2024   "

## 2024-08-08 NOTE — Progress Notes (Addendum)
 Upon entering the room, patient was having shallow breathing but was able to follow command and track me with his eyes. Dr. Gladystine made aware and was requested to come to bedside. When this nurse went back into patient's room, patient's blood pressure was 70's systolic and was having agonal breathing with eyes fixated and HR dropping to the 50's. Dr. Ricky came to bedside and decided to place comfort orders since patient was a DNR/DNI. Patient in PEA without respirations for 5 minutes. Time of death called at 27. Family called by Dr. Ricky.

## 2024-08-08 DEATH — deceased

## 2024-09-03 ENCOUNTER — Ambulatory Visit: Admitting: Pulmonary Disease
